# Patient Record
Sex: Female | Born: 1980 | Race: Black or African American | Hispanic: No | Marital: Married | Smoking: Current every day smoker
Health system: Southern US, Community
[De-identification: ages and names within clinical notes are randomized; demographics above are authoritative.]

## PROBLEM LIST (undated history)

## (undated) DIAGNOSIS — D649 Anemia, unspecified: Secondary | ICD-10-CM

## (undated) DIAGNOSIS — R011 Cardiac murmur, unspecified: Secondary | ICD-10-CM

## (undated) DIAGNOSIS — I10 Essential (primary) hypertension: Secondary | ICD-10-CM

## (undated) DIAGNOSIS — M25561 Pain in right knee: Secondary | ICD-10-CM

## (undated) DIAGNOSIS — G8929 Other chronic pain: Secondary | ICD-10-CM

## (undated) DIAGNOSIS — M25551 Pain in right hip: Secondary | ICD-10-CM

## (undated) DIAGNOSIS — F329 Major depressive disorder, single episode, unspecified: Secondary | ICD-10-CM

## (undated) DIAGNOSIS — F32A Depression, unspecified: Secondary | ICD-10-CM

## (undated) DIAGNOSIS — M51369 Other intervertebral disc degeneration, lumbar region without mention of lumbar back pain or lower extremity pain: Secondary | ICD-10-CM

## (undated) DIAGNOSIS — K219 Gastro-esophageal reflux disease without esophagitis: Secondary | ICD-10-CM

## (undated) DIAGNOSIS — F419 Anxiety disorder, unspecified: Secondary | ICD-10-CM

## (undated) DIAGNOSIS — J45909 Unspecified asthma, uncomplicated: Secondary | ICD-10-CM

## (undated) DIAGNOSIS — Z809 Family history of malignant neoplasm, unspecified: Secondary | ICD-10-CM

## (undated) DIAGNOSIS — E785 Hyperlipidemia, unspecified: Secondary | ICD-10-CM

## (undated) DIAGNOSIS — C801 Malignant (primary) neoplasm, unspecified: Secondary | ICD-10-CM

## (undated) DIAGNOSIS — M5126 Other intervertebral disc displacement, lumbar region: Secondary | ICD-10-CM

## (undated) DIAGNOSIS — M25562 Pain in left knee: Secondary | ICD-10-CM

## (undated) DIAGNOSIS — R269 Unspecified abnormalities of gait and mobility: Principal | ICD-10-CM

## (undated) DIAGNOSIS — E669 Obesity, unspecified: Secondary | ICD-10-CM

## (undated) DIAGNOSIS — M1990 Unspecified osteoarthritis, unspecified site: Secondary | ICD-10-CM

## (undated) DIAGNOSIS — M4800 Spinal stenosis, site unspecified: Secondary | ICD-10-CM

## (undated) DIAGNOSIS — M25552 Pain in left hip: Secondary | ICD-10-CM

## (undated) DIAGNOSIS — M5136 Other intervertebral disc degeneration, lumbar region: Secondary | ICD-10-CM

## (undated) HISTORY — DX: Pain in right hip: M25.551

## (undated) HISTORY — DX: Other intervertebral disc displacement, lumbar region: M51.26

## (undated) HISTORY — DX: Other intervertebral disc degeneration, lumbar region without mention of lumbar back pain or lower extremity pain: M51.369

## (undated) HISTORY — DX: Pain in left hip: M25.552

## (undated) HISTORY — DX: Pain in left knee: M25.562

## (undated) HISTORY — DX: Pain in right knee: M25.561

## (undated) HISTORY — DX: Family history of malignant neoplasm, unspecified: Z80.9

## (undated) HISTORY — DX: Depression, unspecified: F32.A

## (undated) HISTORY — DX: Other intervertebral disc degeneration, lumbar region: M51.36

## (undated) HISTORY — DX: Major depressive disorder, single episode, unspecified: F32.9

## (undated) HISTORY — PX: CHALAZION EXCISION: SHX213

## (undated) HISTORY — DX: Essential (primary) hypertension: I10

## (undated) HISTORY — DX: Unspecified abnormalities of gait and mobility: R26.9

## (undated) HISTORY — PX: WISDOM TOOTH EXTRACTION: SHX21

## (undated) HISTORY — DX: Other chronic pain: G89.29

## (undated) HISTORY — DX: Anxiety disorder, unspecified: F41.9

## (undated) HISTORY — DX: Spinal stenosis, site unspecified: M48.00

## (undated) HISTORY — DX: Obesity, unspecified: E66.9

## (undated) MED FILL — Goserelin Acetate Implant 3.6 MG: SUBCUTANEOUS | Qty: 3.6 | Status: AC

## (undated) NOTE — *Deleted (*Deleted)
Pt here for patient teaching.  Pt given Radiation and You booklet, skin care instructions, Alra deodorant and Radiaplex gel.  Reviewed areas of pertinence such as fatigue, hair loss, skin changes, breast tenderness and breast swelling . Pt able to give teach back of to pat skin and use unscented/gentle soap,apply Radiaplex bid, avoid applying anything to skin within 4 hours of treatment, avoid wearing an under wire bra and to use an electric razor if they must shave. Pt verbalizes understanding of information given and will contact nursing with any questions or concerns.     Adler Chartrand M. Llewelyn Sheaffer RN, BSN      

---
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
## 1999-01-30 ENCOUNTER — Emergency Department (HOSPITAL_COMMUNITY): Admission: EM | Admit: 1999-01-30 | Discharge: 1999-01-30 | Payer: Self-pay | Admitting: Emergency Medicine

## 1999-02-02 ENCOUNTER — Emergency Department (HOSPITAL_COMMUNITY): Admission: EM | Admit: 1999-02-02 | Discharge: 1999-02-02 | Payer: Self-pay | Admitting: *Deleted

## 2000-06-30 ENCOUNTER — Emergency Department (HOSPITAL_COMMUNITY): Admission: EM | Admit: 2000-06-30 | Discharge: 2000-06-30 | Payer: Self-pay | Admitting: Emergency Medicine

## 2000-10-14 ENCOUNTER — Ambulatory Visit (HOSPITAL_COMMUNITY): Admission: RE | Admit: 2000-10-14 | Discharge: 2000-10-14 | Payer: Self-pay | Admitting: *Deleted

## 2001-03-08 ENCOUNTER — Inpatient Hospital Stay (HOSPITAL_COMMUNITY): Admission: AD | Admit: 2001-03-08 | Discharge: 2001-03-11 | Payer: Self-pay | Admitting: *Deleted

## 2001-03-08 ENCOUNTER — Encounter (INDEPENDENT_AMBULATORY_CARE_PROVIDER_SITE_OTHER): Payer: Self-pay | Admitting: *Deleted

## 2002-01-27 ENCOUNTER — Emergency Department (HOSPITAL_COMMUNITY): Admission: EM | Admit: 2002-01-27 | Discharge: 2002-01-27 | Payer: Self-pay | Admitting: Emergency Medicine

## 2003-01-30 ENCOUNTER — Emergency Department (HOSPITAL_COMMUNITY): Admission: EM | Admit: 2003-01-30 | Discharge: 2003-01-30 | Payer: Self-pay | Admitting: Emergency Medicine

## 2003-02-26 ENCOUNTER — Emergency Department (HOSPITAL_COMMUNITY): Admission: EM | Admit: 2003-02-26 | Discharge: 2003-02-26 | Payer: Self-pay | Admitting: Emergency Medicine

## 2005-03-02 DIAGNOSIS — A4902 Methicillin resistant Staphylococcus aureus infection, unspecified site: Secondary | ICD-10-CM

## 2005-03-02 HISTORY — DX: Methicillin resistant Staphylococcus aureus infection, unspecified site: A49.02

## 2005-05-17 ENCOUNTER — Emergency Department (HOSPITAL_COMMUNITY): Admission: AD | Admit: 2005-05-17 | Discharge: 2005-05-17 | Payer: Self-pay | Admitting: Family Medicine

## 2006-04-11 ENCOUNTER — Emergency Department (HOSPITAL_COMMUNITY): Admission: EM | Admit: 2006-04-11 | Discharge: 2006-04-12 | Payer: Self-pay | Admitting: Emergency Medicine

## 2006-08-26 ENCOUNTER — Emergency Department (HOSPITAL_COMMUNITY): Admission: EM | Admit: 2006-08-26 | Discharge: 2006-08-26 | Payer: Self-pay | Admitting: Family Medicine

## 2006-12-08 ENCOUNTER — Emergency Department (HOSPITAL_COMMUNITY): Admission: EM | Admit: 2006-12-08 | Discharge: 2006-12-08 | Payer: Self-pay | Admitting: Family Medicine

## 2008-09-13 ENCOUNTER — Emergency Department (HOSPITAL_COMMUNITY): Admission: EM | Admit: 2008-09-13 | Discharge: 2008-09-13 | Payer: Self-pay | Admitting: Emergency Medicine

## 2008-11-03 ENCOUNTER — Emergency Department (HOSPITAL_COMMUNITY): Admission: EM | Admit: 2008-11-03 | Discharge: 2008-11-03 | Payer: Self-pay | Admitting: Emergency Medicine

## 2009-11-23 ENCOUNTER — Emergency Department (HOSPITAL_COMMUNITY): Admission: EM | Admit: 2009-11-23 | Discharge: 2009-11-23 | Payer: Self-pay | Admitting: Family Medicine

## 2009-11-26 ENCOUNTER — Emergency Department (HOSPITAL_COMMUNITY): Admission: EM | Admit: 2009-11-26 | Discharge: 2009-11-26 | Payer: Self-pay | Admitting: Family Medicine

## 2010-06-13 LAB — CULTURE, ROUTINE-ABSCESS

## 2010-10-31 ENCOUNTER — Inpatient Hospital Stay (INDEPENDENT_AMBULATORY_CARE_PROVIDER_SITE_OTHER)
Admission: RE | Admit: 2010-10-31 | Discharge: 2010-10-31 | Disposition: A | Discharge status: Home / Self Care | Payer: Self-pay | Source: Ambulatory Visit | Attending: Emergency Medicine | Admitting: Emergency Medicine

## 2010-10-31 ENCOUNTER — Ambulatory Visit (INDEPENDENT_AMBULATORY_CARE_PROVIDER_SITE_OTHER): Payer: Self-pay

## 2010-10-31 DIAGNOSIS — F411 Generalized anxiety disorder: Secondary | ICD-10-CM

## 2010-10-31 DIAGNOSIS — R071 Chest pain on breathing: Secondary | ICD-10-CM

## 2011-01-01 ENCOUNTER — Ambulatory Visit (HOSPITAL_COMMUNITY): Payer: Self-pay | Admitting: Physician Assistant

## 2011-01-09 LAB — CULTURE, ROUTINE-ABSCESS

## 2011-04-15 ENCOUNTER — Encounter (HOSPITAL_COMMUNITY): Payer: Self-pay | Admitting: Cardiology

## 2011-04-15 ENCOUNTER — Emergency Department (INDEPENDENT_AMBULATORY_CARE_PROVIDER_SITE_OTHER)
Admission: EM | Admit: 2011-04-15 | Discharge: 2011-04-15 | Disposition: A | Discharge status: Home / Self Care | Payer: Self-pay | Source: Home / Self Care

## 2011-04-15 DIAGNOSIS — J039 Acute tonsillitis, unspecified: Secondary | ICD-10-CM

## 2011-04-15 LAB — POCT RAPID STREP A: Streptococcus, Group A Screen (Direct): NEGATIVE

## 2011-04-15 MED ORDER — AMOXICILLIN 500 MG PO CAPS: 500.0000 mg | ORAL_CAPSULE | Freq: Three times a day (TID) | ORAL | Status: AC

## 2011-04-15 NOTE — ED Notes (Signed)
Pt reports sore throat and cough that started this past Friday. Denies fever. Decreased PO intake.

## 2011-04-15 NOTE — ED Provider Notes (Signed)
History     CSN: [id number]  Arrival date & time 04/15/11  1043   None     Chief Complaint  Patient presents with  . Cough  . Sore Throat    (Consider location/radiation/quality/duration/timing/severity/associated sxs/prior treatment) HPI Comments: Patient presents with today with complaints of sore throat for 5 days, worsens with swallowing. She also has a mild cough. She has been taking over-the-counter cough medications and using throat lozenges without improvement. No fever or nasal congestion. The glands in her throat also felt swollen but have improved.    History reviewed. No pertinent past medical history.  Past Surgical History  Procedure Date  . Vaginal delivery 2002    No family history on file.  History  Substance Use Topics  . Smoking status: Current Everyday Smoker -- 0.5 packs/day  . Smokeless tobacco: Not on file  . Alcohol Use: Yes     occas    OB History    Grav Para Term Preterm Abortions TAB SAB Ect Mult Living                  Review of Systems  Constitutional: Negative for fever, chills and fatigue.  HENT: Positive for sore throat. Negative for ear pain, rhinorrhea, sneezing, postnasal drip and sinus pressure.   Respiratory: Positive for cough. Negative for shortness of breath and wheezing.   Cardiovascular: Negative for chest pain and palpitations.    Allergies  Aspirin  Home Medications   Current Outpatient Rx  Name Route Sig Dispense Refill  . AMOXICILLIN 500 MG PO CAPS Oral Take 1 capsule (500 mg total) by mouth 3 (three) times daily. 30 capsule 0    BP 148/81  Pulse 82  Temp(Src) 98.5 F (36.9 C) (Oral)  Resp 20  SpO2 99%  LMP 04/04/2011  Physical Exam  Nursing note and vitals reviewed. Constitutional: She appears well-developed and well-nourished. No distress.  HENT:  Head: Normocephalic and atraumatic.  Right Ear: Tympanic membrane, external ear and ear canal normal.  Left Ear: Tympanic membrane, external ear and  ear canal normal.  Nose: Nose normal.  Mouth/Throat: Uvula is midline and mucous membranes are normal. Oropharyngeal exudate and posterior oropharyngeal erythema present. No posterior oropharyngeal edema or tonsillar abscesses.       Bilat tonsils 1+ with white pustules.   Neck: Neck supple.  Cardiovascular: Normal rate, regular rhythm and normal heart sounds.   Pulmonary/Chest: Effort normal and breath sounds normal. No respiratory distress.  Lymphadenopathy:    She has no cervical adenopathy.  Neurological: She is alert.  Skin: Skin is warm and dry.  Psychiatric: She has a normal mood and affect.    ED Course  Procedures (including critical care time)   Labs Reviewed  POCT RAPID STREP A ([facility] ONLY)   No results found.   1. Acute tonsillitis       MDM  Rapid strep neg.         Melody Comas, [place] 04/15/11 1140

## 2011-04-17 NOTE — ED Provider Notes (Signed)
Medical screening examination/treatment/procedure(s) were performed by non-physician practitioner and as supervising physician I was immediately available for consultation/collaboration.  Rudra Hobbins M. MD   Nasiya Pascual M Lucendia Leard, MD 04/17/11 0852 

## 2012-07-06 ENCOUNTER — Emergency Department (INDEPENDENT_AMBULATORY_CARE_PROVIDER_SITE_OTHER): Payer: Medicaid Other

## 2012-07-06 ENCOUNTER — Emergency Department (HOSPITAL_COMMUNITY)
Admission: EM | Admit: 2012-07-06 | Discharge: 2012-07-06 | Disposition: A | Discharge status: Home / Self Care | Payer: Medicaid Other | Source: Home / Self Care | Attending: Family Medicine | Admitting: Family Medicine

## 2012-07-06 ENCOUNTER — Encounter (HOSPITAL_COMMUNITY): Payer: Self-pay | Admitting: *Deleted

## 2012-07-06 DIAGNOSIS — M25569 Pain in unspecified knee: Secondary | ICD-10-CM

## 2012-07-06 DIAGNOSIS — M25562 Pain in left knee: Secondary | ICD-10-CM

## 2012-07-06 MED ORDER — DICLOFENAC POTASSIUM 50 MG PO TABS: 50.0000 mg | ORAL_TABLET | Freq: Three times a day (TID) | ORAL | Status: DC

## 2012-07-06 NOTE — ED Notes (Signed)
C/o bil. Knee pain x 2 weeks. No known injury.  Works as a Runner, broadcasting/film/video in [employer].  Denies squatting but does sit in small chairs.  No swelling noted by pt.

## 2012-07-06 NOTE — ED Provider Notes (Signed)
History     CSN: [id number]  Arrival date & time 07/06/12  1532   First MD Initiated Contact with Patient 07/06/12 [phone]      Chief Complaint  Patient presents with  . Knee Pain    (Consider location/radiation/quality/duration/timing/severity/associated sxs/prior treatment) Patient is a 32 y.o. female presenting with knee pain. The history is provided by the patient.  Knee Pain Location:  Knee Time since incident:  2 weeks Injury: no   Knee location:  L knee and R knee Pain details:    Quality:  Aching and sharp   Radiates to:  Does not radiate   Severity:  Mild   Onset quality:  Gradual   Progression:  Unchanged Chronicity:  New Dislocation: no   Relieved by:  Nothing Ineffective treatments:  NSAIDs, arthritis medication and heat Associated symptoms: no back pain, no muscle weakness, no numbness and no swelling     History reviewed. No pertinent past medical history.  Past Surgical History  Procedure Laterality Date  . Vaginal delivery  2002    Family History  Problem Relation Age of Onset  . Osteoarthritis Mother   . Hypertension Father   . Gout Father     History  Substance Use Topics  . Smoking status: Current Every Day Smoker -- 0.50 packs/day    Types: Cigarettes  . Smokeless tobacco: Not on file  . Alcohol Use: Yes     Comment: occas    OB History   Grav Para Term Preterm Abortions TAB SAB Ect Mult Living                  Review of Systems  Constitutional: Negative.   Gastrointestinal: Negative.   Musculoskeletal: Negative for myalgias, back pain and joint swelling.  Skin: Negative.     Allergies  Aspirin  Home Medications   Current Outpatient Rx  Name  Route  Sig  Dispense  Refill  . acetaminophen (TYLENOL) 500 MG tablet   Oral   Take 500 mg by mouth every 6 (six) hours as needed for pain.         Marland Kitchen ibuprofen (ADVIL,MOTRIN) 800 MG tablet   Oral   Take 800 mg by mouth every 8 (eight) hours as needed for pain.         Marland Kitchen  diclofenac (CATAFLAM) 50 MG tablet   Oral   Take 1 tablet (50 mg total) by mouth 3 (three) times daily.   30 tablet   0     BP 146/79  Pulse 62  Temp(Src) 98.6 F (37 C) (Oral)  Resp 16  SpO2 100%  LMP 07/02/2012  Physical Exam  Nursing note and vitals reviewed. Constitutional: She is oriented to person, place, and time.  Abdominal: Soft. Bowel sounds are normal.  Musculoskeletal: Normal range of motion. She exhibits tenderness. She exhibits no edema.       Right knee: She exhibits normal range of motion, no swelling, no effusion, no deformity, no LCL laxity, normal patellar mobility, normal meniscus and no MCL laxity. Tenderness found. No medial joint line and no lateral joint line tenderness noted.       Left knee: She exhibits bony tenderness. She exhibits normal range of motion, no swelling, no effusion and normal meniscus. Tenderness found. No patellar tendon tenderness noted.       Legs: Neurological: She is alert and oriented to person, place, and time.  Skin: Skin is warm and dry.    ED Course  Procedures (including critical  care time)  Labs Reviewed - No data to display Dg Knee Complete 4 Views Left  07/06/2012  *RADIOLOGY REPORT*  Clinical Data: Pain  LEFT KNEE - COMPLETE 4+ VIEW  Comparison: None.  Findings: There is no evidence of fracture or dislocation.  There is no evidence of arthropathy or other focal bone abnormality. Soft tissues are unremarkable.  IMPRESSION: Negative exam.   Original Report Authenticated By: Signa Kell, M.D.      1. Knee pain, bilateral       MDM  X-rays reviewed and report per radiologist.         Linna Hoff, MD 07/06/12 [phone]

## 2012-07-25 ENCOUNTER — Emergency Department (HOSPITAL_COMMUNITY)
Admission: EM | Admit: 2012-07-25 | Discharge: 2012-07-25 | Disposition: A | Discharge status: Home / Self Care | Payer: Medicaid Other | Attending: Emergency Medicine | Admitting: Emergency Medicine

## 2012-07-25 ENCOUNTER — Encounter (HOSPITAL_COMMUNITY): Payer: Self-pay | Admitting: *Deleted

## 2012-07-25 ENCOUNTER — Emergency Department (HOSPITAL_COMMUNITY): Payer: Medicaid Other

## 2012-07-25 DIAGNOSIS — R197 Diarrhea, unspecified: Secondary | ICD-10-CM | POA: Insufficient documentation

## 2012-07-25 DIAGNOSIS — Y9301 Activity, walking, marching and hiking: Secondary | ICD-10-CM | POA: Insufficient documentation

## 2012-07-25 DIAGNOSIS — S8990XA Unspecified injury of unspecified lower leg, initial encounter: Secondary | ICD-10-CM | POA: Insufficient documentation

## 2012-07-25 DIAGNOSIS — R296 Repeated falls: Secondary | ICD-10-CM | POA: Insufficient documentation

## 2012-07-25 DIAGNOSIS — F17200 Nicotine dependence, unspecified, uncomplicated: Secondary | ICD-10-CM | POA: Insufficient documentation

## 2012-07-25 DIAGNOSIS — R269 Unspecified abnormalities of gait and mobility: Secondary | ICD-10-CM

## 2012-07-25 DIAGNOSIS — S99929A Unspecified injury of unspecified foot, initial encounter: Secondary | ICD-10-CM | POA: Insufficient documentation

## 2012-07-25 DIAGNOSIS — Y929 Unspecified place or not applicable: Secondary | ICD-10-CM | POA: Insufficient documentation

## 2012-07-25 LAB — CBC WITH DIFFERENTIAL/PLATELET
Basophils Absolute: 0 10*3/uL (ref 0.0–0.1)
Basophils Relative: 1 % (ref 0–1)
HCT: 39.8 % (ref 36.0–46.0)
MCHC: 34.4 g/dL (ref 30.0–36.0)
Monocytes Absolute: 0.6 10*3/uL (ref 0.1–1.0)
Neutro Abs: 5.2 10*3/uL (ref 1.7–7.7)
Neutrophils Relative %: 61 % (ref 43–77)
Platelets: 345 10*3/uL (ref 150–400)
RDW: 13.2 % (ref 11.5–15.5)
WBC: 8.4 10*3/uL (ref 4.0–10.5)

## 2012-07-25 LAB — BASIC METABOLIC PANEL
Chloride: 106 mEq/L (ref 96–112)
Creatinine, Ser: 0.88 mg/dL (ref 0.50–1.10)
GFR calc Af Amer: >90 mL/min (ref >90)
Sodium: 136 mEq/L (ref 135–145)

## 2012-07-25 LAB — URINALYSIS, ROUTINE W REFLEX MICROSCOPIC
Ketones, ur: NEGATIVE mg/dL
Nitrite: NEGATIVE
Protein, ur: NEGATIVE mg/dL
pH: 5.5 (ref 5.0–8.0)

## 2012-07-25 LAB — URINE MICROSCOPIC-ADD ON

## 2012-07-25 MED ORDER — KETOROLAC TROMETHAMINE 60 MG/2ML IM SOLN
60.0000 mg | Freq: Once | INTRAMUSCULAR | Status: AC
  Administered 2012-07-25: 60 mg via INTRAMUSCULAR
  Filled 2012-07-25: qty 2

## 2012-07-25 MED ORDER — HYDROCODONE-ACETAMINOPHEN 5-325 MG PO TABS: 2.0000 | ORAL_TABLET | ORAL | Status: DC | PRN

## 2012-07-25 MED ORDER — GADOBENATE DIMEGLUMINE 529 MG/ML IV SOLN
20.0000 mL | Freq: Once | INTRAVENOUS | Status: AC | PRN
  Administered 2012-07-25: 20 mL via INTRAVENOUS

## 2012-07-25 MED ORDER — OXYCODONE-ACETAMINOPHEN 5-325 MG PO TABS
2.0000 | ORAL_TABLET | Freq: Once | ORAL | Status: AC
  Administered 2012-07-25: 2 via ORAL
  Filled 2012-07-25: qty 2

## 2012-07-25 NOTE — ED Notes (Signed)
MD at bedside. 

## 2012-07-25 NOTE — ED Provider Notes (Signed)
History     CSN: [id number]  Arrival date & time 07/25/12  0818   First MD Initiated Contact with Patient 07/25/12 [phone]      Chief Complaint  Patient presents with  . Abdominal Pain  . Leg Pain  . Diarrhea    (Consider location/radiation/quality/duration/timing/severity/associated sxs/prior treatment) HPI Comments: Pt has a hx of no sig problems, is 31 and has had 4 weeks of bilateral knee pain.  She states that she was initially seen at [facility] where she had bilateral knee x-rays ordered, no signs of fracture or dislocation was seen in the patient was discharged with anti-inflammatory medications. Since that time she has progressive pain in her knees which seems to be worse at night, better early in the day, had 2 unexplained falls while she was walking, now states that she is walking with a limp and has numbness in her left lower extremity in the thigh and the lower extremity below the knee. She denies any symptoms in her upper extremities and has no problems with her vision, speech or her urinary or bowel incontinence.  She denies any family history of lupus, rheumatoid arthritis or other connective tissue or autoimmune diseases. She denies any family history of multiple sclerosis. She has not had any recent illnesses such as viral upper respiratory or GI illnesses.  Symptoms are persistent and gradually worsening. She has been using anti-inflammatories with minimal improvement  Patient is a 32 y.o. female presenting with abdominal pain, leg pain, and diarrhea. The history is provided by the patient.  Abdominal Pain Associated symptoms: diarrhea   Leg Pain Diarrhea Associated symptoms: abdominal pain     History reviewed. No pertinent past medical history.  Past Surgical History  Procedure Laterality Date  . Vaginal delivery  2002    Family History  Problem Relation Age of Onset  . Osteoarthritis Mother   . Hypertension Father   . Gout Father     History  Substance Use Topics   . Smoking status: Current Every Day Smoker -- 0.50 packs/day    Types: Cigarettes  . Smokeless tobacco: Never Used  . Alcohol Use: Yes     Comment: occas    OB History   Grav Para Term Preterm Abortions TAB SAB Ect Mult Living                  Review of Systems  Gastrointestinal: Positive for abdominal pain and diarrhea.  All other systems reviewed and are negative.    Allergies  Aspirin  Home Medications   Current Outpatient Rx  Name  Route  Sig  Dispense  Refill  . acetaminophen (TYLENOL) 500 MG tablet   Oral   Take 500 mg by mouth every 6 (six) hours as needed for pain.         Marland Kitchen diclofenac (CATAFLAM) 50 MG tablet   Oral   Take 1 tablet (50 mg total) by mouth 3 (three) times daily.   30 tablet   0   . ibuprofen (ADVIL,MOTRIN) 800 MG tablet   Oral   Take 800 mg by mouth every 8 (eight) hours as needed for pain.         Marland Kitchen HYDROcodone-acetaminophen (NORCO/VICODIN) 5-325 MG per tablet   Oral   Take 2 tablets by mouth every 4 (four) hours as needed for pain.   10 tablet   0     BP 126/52  Pulse 52  Temp(Src) 98.2 F (36.8 C) (Oral)  Resp 16  Wt 252  lb (114.306 kg)  SpO2 100%  LMP 07/02/2012  Physical Exam  Nursing note and vitals reviewed. Constitutional: She appears well-developed and well-nourished. No distress.  HENT:  Head: Normocephalic and atraumatic.  Mouth/Throat: Oropharynx is clear and moist. No oropharyngeal exudate.  Eyes: Conjunctivae and EOM are normal. Pupils are equal, round, and reactive to light. Right eye exhibits no discharge. Left eye exhibits no discharge. No scleral icterus.  Neck: Normal range of motion. Neck supple. No JVD present. No thyromegaly present.  Cardiovascular: Normal rate, regular rhythm, normal heart sounds and intact distal pulses.  Exam reveals no gallop and no friction rub.   No murmur heard. Pulmonary/Chest: Effort normal and breath sounds normal. No respiratory distress. She has no wheezes. She has no  rales.  Abdominal: Soft. Bowel sounds are normal. She exhibits no distension and no mass. There is no tenderness.  Musculoskeletal: Normal range of motion. She exhibits no edema and no tenderness.  Lymphadenopathy:    She has no cervical adenopathy.  Neurological: She is alert. Coordination normal.  Normal reflexes at her knees bilaterally, normal strength of the quadriceps, hamstrings and calf muscles, normal strength of the upper extremities and major muscle groups. No tenderness with range of motion of the knees bilaterally, no tenderness with palpation of the thighs or the calf muscles bilaterally, sensation decreased to light touch and pinprick of the left lower extremity below the hip.  Per extremities with normal sensation, cranial nerves III through XII intact.  Gait is abnormal, she is unable to heel toe walk, feels unstable and has apparent mild left leg weakness when she walks.  Skin: Skin is warm and dry. No rash noted. No erythema.  Psychiatric: She has a normal mood and affect. Her behavior is normal.    ED Course  Procedures (including critical care time)  Labs Reviewed  BASIC METABOLIC PANEL - Abnormal; Notable for the following:    Potassium 5.3 (*)    Glucose, Bld 114 (*)    GFR calc non Af Amer 86 (*)    All other components within normal limits  URINALYSIS, ROUTINE W REFLEX MICROSCOPIC - Abnormal; Notable for the following:    APPearance CLOUDY (*)    Leukocytes, UA SMALL (*)    All other components within normal limits  CK - Abnormal; Notable for the following:    Total CK 668 (*)    All other components within normal limits  URINE MICROSCOPIC-ADD ON - Abnormal; Notable for the following:    Squamous Epithelial / LPF FEW (*)    All other components within normal limits  CBC WITH DIFFERENTIAL  SEDIMENTATION RATE   Mr Laqueta Jean Wo Contrast  07/25/2012  *RADIOLOGY REPORT*  Clinical Data: Bilateral weakness and numbness for 4 weeks progressively getting worse.   Evaluate for possible multiple sclerosis lesion.  MRI HEAD WITHOUT AND WITH CONTRAST  Technique:  Multiplanar, multiecho pulse sequences of the brain and surrounding structures were obtained according to standard protocol without and with intravenous contrast  Contrast: 20mL MULTIHANCE GADOBENATE DIMEGLUMINE 529 MG/ML IV SOLN  Comparison: None.  Findings: No acute infarct.  No intracranial hemorrhage.  No focal area of altered signal intensity to suggest the presence of a demyelinating process.  No intracranial mass or abnormal enhancement.  Expanded partially empty sella.  Although this may be an incidental finding, this has been described in patients with pseudotumor cerebri.  Remodeling of the sella greater on the left.  Exophthalmos.  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  Polypoid opacification in the left maxillary sinus.  IMPRESSION: No acute infarct.  No focal area of altered signal intensity to suggest the presence of a demyelinating process.  No intracranial mass or abnormal enhancement.  Expanded partially empty sella.  Although this may be an incidental finding, this has been described in patients with pseudotumor cerebri.  Remodeling of the sella greater on the left.  Exophthalmos.   Original Report Authenticated By: Lacy Duverney, M.D.      1. Lower extremity pain, unspecified laterality   2. Gait abnormality       MDM  There is concern that the patient has a neurologic process giving her left lower extremity symptoms such as weakness and imbalance, would be concerned for things such as multiple sclerosis or other etiologies. She does have mild back pain but this is intermittent and does not seem to correlate with her other symptoms. MRI pending, labs pending, patient hemodynamically stable.  Safely the sedimentation rate is normal, white blood cell count is normal, urinalysis is clean, CK is slightly elevated  at 660 and the potassium is 5.3. The patient has been informed of all these results and she will need to followup closely to have them rechecked. She has been given return precautions should her symptoms worsen. I have since watched her ambulate down the hallway by herself to the bathroom and she had a much more stable gait. I doubt that this is related to her lumbar spine given her minimal pain and ability to have normal strength of her legs, it does not appear to be a demyelinating disease given her MRI of the brain. I recommend a neurologic followup  Meds given in ED:  Medications  ketorolac (TORADOL) injection 60 mg (60 mg Intramuscular Given 07/25/12 1038)  gadobenate dimeglumine (MULTIHANCE) injection 20 mL (20 mLs Intravenous Contrast Given 07/25/12 1147)  oxyCODONE-acetaminophen (PERCOCET/ROXICET) 5-325 MG per tablet 2 tablet (2 tablets Oral Given 07/25/12 1307)    New Prescriptions   HYDROCODONE-ACETAMINOPHEN (NORCO/VICODIN) 5-325 MG PER TABLET    Take 2 tablets by mouth every 4 (four) hours as needed for pain.          Vida Roller, MD 07/25/12 [phone]

## 2012-07-25 NOTE — ED Notes (Signed)
Pt from home with reports of bilateral knee pain that started around the end of March which pt was treated for 4/9 at [facility]gent [facility] with minimal improvement. Pt reports that knees/legs have "just given away" causing a fall 2 weeks ago. Pt also endorses intermittent abdominal pain and diarrhea.

## 2012-07-25 NOTE — ED Notes (Signed)
Pt was found lying on the floor but the secretary and tech in room 13. I was called into the room and tech Tawanna Cooler and [facility] was at bedside. Pt states that she was getting up to get apples in her chair and returned to bed and states that she slipped and fell while getting back to bed. States that she did not feel dizzy and had not had pain medication. Pt did have the call bell in the bed and red socks on. Pt was a low fall risk per nurse Lyla Son. Pt did say that the secretary told her to use the call bell but she had been walking around on her own with out any problems. Pt states her knees feel sore but was able to stand on her own and get to the bed with minimal assistance. Room door is open, curtian pulled back, call bell in reach, red socks in place and fall assesment done for high risk falls. Reported to md .

## 2012-07-25 NOTE — ED Notes (Signed)
Pt returned from MRI °

## 2012-07-27 ENCOUNTER — Telehealth: Payer: Self-pay | Admitting: Neurology

## 2012-07-27 NOTE — Telephone Encounter (Signed)
Pt left VM requesting neuro appt this week following ER visit. Dr. Arbutus Leas has reviewed ER notes and denied appt at this time. Pt needs to establish care w/ a PCP and the PCP can refer her to neuro if needed. Pt has [facility] w/ [facility] on the card, they will need to issue a referral and provide authorization. Returned call to patient and left message on her voicemail advising her of this / Sherri S.

## 2012-08-02 ENCOUNTER — Ambulatory Visit (INDEPENDENT_AMBULATORY_CARE_PROVIDER_SITE_OTHER): Payer: Medicaid Other | Admitting: Neurology

## 2012-08-02 ENCOUNTER — Telehealth: Payer: Self-pay | Admitting: Neurology

## 2012-08-02 ENCOUNTER — Encounter: Payer: Self-pay | Admitting: Neurology

## 2012-08-02 VITALS — BP 153/99 | HR 96 | Ht 66.5 in | Wt 241.0 lb

## 2012-08-02 DIAGNOSIS — M25559 Pain in unspecified hip: Secondary | ICD-10-CM

## 2012-08-02 DIAGNOSIS — IMO0001 Reserved for inherently not codable concepts without codable children: Secondary | ICD-10-CM

## 2012-08-02 DIAGNOSIS — G8929 Other chronic pain: Secondary | ICD-10-CM

## 2012-08-02 DIAGNOSIS — R269 Unspecified abnormalities of gait and mobility: Secondary | ICD-10-CM | POA: Insufficient documentation

## 2012-08-02 HISTORY — DX: Other chronic pain: G89.29

## 2012-08-02 HISTORY — DX: Unspecified abnormalities of gait and mobility: R26.9

## 2012-08-02 MED ORDER — HYDROCODONE-ACETAMINOPHEN 5-325 MG PO TABS: 2.0000 | ORAL_TABLET | Freq: Four times a day (QID) | ORAL | Status: DC | PRN

## 2012-08-02 NOTE — Telephone Encounter (Signed)
I called the patient. She got hydrocodone through the ER and she needs another Rx. I will call in a Rx.

## 2012-08-02 NOTE — Progress Notes (Signed)
Reason for visit: Arthralgias  Heidi Costa is a 32 y.o. female  History of present illness:  Heidi Costa is a 32 year old right-handed black female with a history of arthralgias mainly involving the knees that began around the end of March of 2014. The patient has developed some sensory alteration in the legs, left greater than right, that may go up to the knees or even into the thighs. The patient has a tender sensation across the lower abdomen, left greater than right. The patient has also noted that her bowels and her bladder are not working as well. The patient recently has developed some fecal incontinence that has occurred on occasion. The patient also is having some problems with controlling the bladder. The patient has noted a change in balance, and she is using a cane for ambulation. MRI evaluation of the brain was relatively unremarkable. The patient is also having some cramps involving the right arm, and some sensation of arm weakness on the right. The patient has had blood work that shows a normal sedimentation rate, but the CK enzyme level was in the 660 range. The patient indicates that she will had a fall around the time of the blood work being done. The patient is being sent to this office for an evaluation.  Past Medical History  Diagnosis Date  . Bulging lumbar disc   . Depression   . Anxiety   . Abnormality of gait 08/02/2012  . Chronic arthralgias of knees and hips 08/02/2012  . Obesity     Past Surgical History  Procedure Laterality Date  . Vaginal delivery  2002    Family History  Problem Relation Age of Onset  . Osteoarthritis Mother   . Hypertension Mother   . Hypertension Father   . Gout Father   . Stroke Father     Social history:  reports that she has been smoking Cigarettes.  She has been smoking about 0.50 packs per day. She has never used smokeless tobacco. She reports that  drinks alcohol. She reports that she does not use illicit  drugs.  Medications:  Current Outpatient Prescriptions on File Prior to Visit  Medication Sig Dispense Refill  . acetaminophen (TYLENOL) 500 MG tablet Take 500 mg by mouth every 6 (six) hours as needed for pain.      Marland Kitchen diclofenac (CATAFLAM) 50 MG tablet Take 1 tablet (50 mg total) by mouth 3 (three) times daily.  30 tablet  0  . ibuprofen (ADVIL,MOTRIN) 800 MG tablet Take 800 mg by mouth every 8 (eight) hours as needed for pain.       No current facility-administered medications on file prior to visit.    Allergies:  Allergies  Allergen Reactions  . Aspirin Rash    ROS:  Out of a complete 14 system review of symptoms, the patient complains only of the following symptoms, and all other reviewed systems are negative.  Fatigue Urination problems Feeling hot, cold Joint pains, achy muscles Skin sensitivity Numbness, weakness Anxiety, depression, decreased energy, change in appetite, this interest in activities Sleepiness, snoring, restless legs  Blood pressure 153/99, pulse 96, height 5' 6.5" (1.689 m), weight 241 lb (109.317 kg), last menstrual period 07/02/2012.  Physical Exam  General: The patient is alert and cooperative at the time of the examination. The patient is markedly obese.  Head: Pupils are equal, round, and reactive to light. Discs are flat bilaterally.  Neck: The neck is supple, no carotid bruits are noted.  Respiratory: The respiratory  examination is clear.  Cardiovascular: The cardiovascular examination reveals a regular rate and rhythm, no obvious murmurs or rubs are noted.  Skin: Extremities are without significant edema.  Neurologic Exam  Mental status:  Cranial nerves: Facial symmetry is present. There is good sensation of the face to pinprick and soft touch bilaterally. The strength of the facial muscles and the muscles to head turning and shoulder shrug are normal bilaterally. Speech is well enunciated, no aphasia or dysarthria is noted.  Extraocular movements are full. Visual fields are full. There is a malar rash across the cheeks.  Motor: The motor testing reveals 5 over 5 strength of all 4 extremities. Good symmetric motor tone is noted throughout. The patient is able to arise from a seated position with the arms crossed.  Sensory: Sensory testing is intact to pinprick, soft touch, vibration sensation, and position sense on all 4 extremities , with the exception that there is some slight decrease in pinprick sensation in the left upper chest and the left arm. There is decreased pinprick sensation and vibration sensation of the left foot.  No evidence of extinction is noted.  Coordination: Cerebellar testing reveals good finger-nose-finger and heel-to-shin bilaterally.  Gait and station: Gait is wide-based, slightly unsteady. The patient uses a cane for ambulation. Tandem gait is unsteady. Romberg is negative, but is unsteady . No drift is seen.  Reflexes: Deep tendon reflexes are symmetric and normal bilaterally. Toes are downgoing bilaterally.   Assessment/Plan:  1. Arthralgias  2. Gait disturbance  The patient has had some changes in sensation and strength of the legs, gait instability, and an elevation in the CK enzyme level. The patient also has a malar rash across the cheeks that has been present for almost 2 years. The patient will need to be evaluated for a connective tissue disease process such as lupus. The patient will be set up for blood work today, and she will have MRI evaluation of the cervical and lumbosacral spine. If the CK enzyme elevation remains, EMG evaluation to exclude the possibility of a low-grade inflammatory myopathy needs to be considered. The patient may require a rheumatology referral in the future.  Marlan Palau MD 08/02/2012 8:43 PM  [facility] [address] [place], [place] [address]  Phone [phone] Fax [phone]

## 2012-08-11 ENCOUNTER — Other Ambulatory Visit: Payer: Self-pay | Admitting: Neurology

## 2012-08-11 DIAGNOSIS — R269 Unspecified abnormalities of gait and mobility: Secondary | ICD-10-CM

## 2012-08-13 ENCOUNTER — Inpatient Hospital Stay: Admission: RE | Admit: 2012-08-13 | Payer: Medicaid Other | Source: Ambulatory Visit

## 2012-08-13 ENCOUNTER — Ambulatory Visit
Admission: RE | Admit: 2012-08-13 | Discharge: 2012-08-13 | Disposition: A | Discharge status: Home / Self Care | Payer: Medicaid Other | Source: Ambulatory Visit | Attending: Neurology | Admitting: Neurology

## 2012-08-13 DIAGNOSIS — R269 Unspecified abnormalities of gait and mobility: Secondary | ICD-10-CM

## 2012-08-13 DIAGNOSIS — IMO0001 Reserved for inherently not codable concepts without codable children: Secondary | ICD-10-CM

## 2012-08-15 ENCOUNTER — Telehealth: Payer: Self-pay | Admitting: Neurology

## 2012-08-15 NOTE — Telephone Encounter (Signed)
I Called the patient. The MRI of the cervical spine shows a disc herniation with cord compression at the C5-6 level. The patient will need a neurosurgical evaluation. The MRI of the lumbosacral spine shows some mild spondylitic changes, no surgically amenable problems.

## 2012-08-15 NOTE — Telephone Encounter (Signed)
I called the patient again and I left messages. The patient is to call our office.

## 2012-08-16 ENCOUNTER — Telehealth: Payer: Self-pay | Admitting: Neurology

## 2012-08-19 ENCOUNTER — Telehealth: Payer: Self-pay | Admitting: Neurology

## 2012-08-19 DIAGNOSIS — M4712 Other spondylosis with myelopathy, cervical region: Secondary | ICD-10-CM

## 2012-08-19 NOTE — Telephone Encounter (Signed)
Spoke with patient and she said that her right side neck pain,humming in ears has gotten worse, went to see a ENT doctor and he suggested that she schedule an appt  with her neurologist. Patient is requesting to also be referred to see an neurosurgeon and possible have an MRI scheduled.May leave message on cell:[phone].

## 2012-08-19 NOTE — Telephone Encounter (Signed)
I spoke to patient and she is interested in getting the surgery.  I told her I would let doctor know so the referral can be made to neurosurgeon and the process can begin.  Please advise if I need to put in referral.

## 2012-08-19 NOTE — Telephone Encounter (Signed)
I will make a referral to a neurosurgeon.

## 2012-08-31 ENCOUNTER — Telehealth: Payer: Self-pay | Admitting: Neurology

## 2012-09-02 ENCOUNTER — Telehealth: Payer: Self-pay | Admitting: Neurology

## 2012-09-02 NOTE — Telephone Encounter (Signed)
Patient is calling to tell us she is still waiting for a referral from [place], so she can get her surgery scheduled.  She tells me she has spoken to Dr. Anne Hahn regarding this.  Please give her a call back at:  [phone].

## 2012-09-13 ENCOUNTER — Other Ambulatory Visit: Payer: Self-pay

## 2012-09-13 MED ORDER — HYDROCODONE-ACETAMINOPHEN 5-325 MG PO TABS: 2.0000 | ORAL_TABLET | Freq: Four times a day (QID) | ORAL | Status: DC | PRN

## 2012-09-13 NOTE — Telephone Encounter (Signed)
Patient called, left message.  Said she is out of Vicodin and needs it refilled today.  (Today is the 21st day)

## 2012-09-27 ENCOUNTER — Telehealth: Payer: Self-pay | Admitting: Neurology

## 2012-09-27 NOTE — Telephone Encounter (Signed)
Spoke to patient. Will check status of referral to neurosurgeon. Patient states she is still in pain.

## 2012-09-27 NOTE — Telephone Encounter (Signed)
Returning [employer]. Please call [phone]

## 2012-09-28 ENCOUNTER — Telehealth: Payer: Self-pay

## 2012-09-29 ENCOUNTER — Telehealth: Payer: Self-pay | Admitting: Neurology

## 2012-09-29 NOTE — Telephone Encounter (Signed)
Sent to [place] [facility] in referrals.

## 2012-09-30 ENCOUNTER — Other Ambulatory Visit: Payer: Self-pay | Admitting: Neurology

## 2012-10-03 NOTE — Telephone Encounter (Signed)
I called the patient. The patient once again indicates that she has not heard anything from the neurosurgical evaluation. The patient is now having low back pain as well. I have personally called [facility], and they indicate that they have not received a referral from our office. I have called Sandy P. in our office, and I have transmitted this information to her, I indicated that a referral needs to be made ASAP. This patient has spinal cord compression at the C5-6 level, initial referral was made around 08/19/2012.

## 2012-10-03 NOTE — Telephone Encounter (Signed)
After further analysis, the patient apparently has 2 names, she now goes by [employer]. The name on her Medicaid card is [place]. The patient just recently got her card in proper order at the end of June 2014 so that a referral can be made appropriately.

## 2012-10-08 ENCOUNTER — Ambulatory Visit
Admission: RE | Admit: 2012-10-08 | Discharge: 2012-10-08 | Disposition: A | Discharge status: Home / Self Care | Payer: Medicaid Other | Source: Ambulatory Visit | Attending: Neurology | Admitting: Neurology

## 2012-10-08 DIAGNOSIS — IMO0001 Reserved for inherently not codable concepts without codable children: Secondary | ICD-10-CM

## 2012-10-08 DIAGNOSIS — R269 Unspecified abnormalities of gait and mobility: Secondary | ICD-10-CM

## 2012-10-08 MED ORDER — GADOBENATE DIMEGLUMINE 529 MG/ML IV SOLN
20.0000 mL | Freq: Once | INTRAVENOUS | Status: AC | PRN
  Administered 2012-10-08: 20 mL via INTRAVENOUS

## 2012-10-10 ENCOUNTER — Telehealth: Payer: Self-pay | Admitting: Neurology

## 2012-10-10 NOTE — Telephone Encounter (Signed)
The spinal cord progression noted at the C5-6 level is actually at the C6-7 level. The patient has been referred to neurosurgery.

## 2012-10-11 MED ORDER — OXYCODONE-ACETAMINOPHEN 5-325 MG PO TABS: 1.0000 | ORAL_TABLET | ORAL | Status: DC | PRN

## 2012-10-11 NOTE — Telephone Encounter (Signed)
Called the patient to get more information about her pain,left voice message asking the patient to return my call.

## 2012-10-11 NOTE — Telephone Encounter (Signed)
I called patient. She is not getting good pain relief with the hydrocodone. The patient will be seen by neurosurgery next week, I will write a prescription for the oxycodone. I called the patient and I left a message.

## 2012-10-11 NOTE — Telephone Encounter (Signed)
Patient states she has been having a lot of pain, so she was taking 4 hydrocodone every 6 hours and has run out of this medication. She has a appointment with neurosurgeon next week but wants something else for pain because her current dose 5-325 of hydrocodone does not work. Please advise

## 2012-10-11 NOTE — Addendum Note (Signed)
Addended by: Stephanie Acre on: 10/11/2012 12:45 PM   Modules accepted: Orders

## 2012-10-16 ENCOUNTER — Other Ambulatory Visit: Payer: Self-pay | Admitting: Neurology

## 2012-10-17 ENCOUNTER — Other Ambulatory Visit: Payer: Self-pay | Admitting: Neurosurgery

## 2012-10-17 NOTE — Telephone Encounter (Signed)
Patient stated Hydrocodone was not working therefore a Rx for Oxycodone was given in it's place.  See Dr Anne Hahn' note.

## 2012-10-24 ENCOUNTER — Encounter (HOSPITAL_COMMUNITY): Payer: Self-pay | Admitting: Pharmacy Technician

## 2012-10-25 ENCOUNTER — Encounter (HOSPITAL_COMMUNITY)
Admission: RE | Admit: 2012-10-25 | Discharge: 2012-10-25 | Disposition: A | Discharge status: Home / Self Care | Payer: Medicaid Other | Source: Ambulatory Visit | Attending: Neurosurgery | Admitting: Neurosurgery

## 2012-10-25 ENCOUNTER — Encounter (HOSPITAL_COMMUNITY): Payer: Self-pay

## 2012-10-25 ENCOUNTER — Encounter (HOSPITAL_COMMUNITY): Admission: RE | Admit: 2012-10-25 | Payer: Medicaid Other | Source: Ambulatory Visit

## 2012-10-25 HISTORY — DX: Anemia, unspecified: D64.9

## 2012-10-25 HISTORY — DX: Cardiac murmur, unspecified: R01.1

## 2012-10-25 HISTORY — DX: Unspecified osteoarthritis, unspecified site: M19.90

## 2012-10-25 HISTORY — DX: Unspecified asthma, uncomplicated: J45.909

## 2012-10-25 HISTORY — DX: Gastro-esophageal reflux disease without esophagitis: K21.9

## 2012-10-25 LAB — BASIC METABOLIC PANEL
BUN: 9 mg/dL (ref 6–23)
CO2: 24 mEq/L (ref 19–32)
Chloride: 107 mEq/L (ref 96–112)
Creatinine, Ser: 0.75 mg/dL (ref 0.50–1.10)
GFR calc Af Amer: >90 mL/min (ref >90)
Glucose, Bld: 93 mg/dL (ref 70–99)
Potassium: 4.3 mEq/L (ref 3.5–5.1)

## 2012-10-25 LAB — CBC
HCT: 41.2 % (ref 36.0–46.0)
Hemoglobin: 14.3 g/dL (ref 12.0–15.0)
MCH: 31.8 pg (ref 26.0–34.0)
MCHC: 34.7 g/dL (ref 30.0–36.0)
MCV: 91.6 fL (ref 78.0–100.0)
RDW: 12.6 % (ref 11.5–15.5)

## 2012-10-25 LAB — SURGICAL PCR SCREEN: MRSA, PCR: NEGATIVE

## 2012-10-25 NOTE — Pre-Procedure Instructions (Signed)
Heidi Costa  10/25/2012   Your procedure is scheduled on:  10/27/2012  Report to [facility] at 9:45 AM.  ENTRANCE A  Call this number if you have problems the morning of surgery: [phone]   Remember:   Do not eat food or drink liquids after midnight. On WEDNESDAY   Take these medicines the morning of surgery with A SIP OF WATER: pain medicine if needed ---- is OK   Do not wear jewelry, make-up or nail polish.  Do not wear lotions, powders, or perfumes. You may wear deodorant.  Do not shave 48 hours prior to surgery.  Do not bring valuables to the hospital.  [facility] is not responsible                   for any belongings or valuables.  Contacts, dentures or bridgework may not be worn into surgery.  Leave suitcase in the car. After surgery it may be brought to your room.  For patients admitted to the hospital, checkout time is 11:00 AM the day of  discharge.   Patients discharged the day of surgery will not be allowed to drive  home.  Name and phone number of your driver: /w friend   Special Instructions: Shower using CHG 2 nights before surgery and the night before surgery.  If you shower the day of surgery use CHG.  Use special wash - you have one bottle of CHG for all showers.  You should use approximately 1/3 of the bottle for each shower.   Please read over the following fact sheets that you were given: Pain Booklet, Coughing and Deep Breathing, MRSA Information and Surgical Site Infection Prevention

## 2012-10-26 MED ORDER — CEFAZOLIN SODIUM-DEXTROSE 2-3 GM-% IV SOLR
2.0000 g | INTRAVENOUS | Status: AC
  Administered 2012-10-27: 2 g via INTRAVENOUS
  Filled 2012-10-26: qty 50

## 2012-10-26 MED ORDER — DEXAMETHASONE SODIUM PHOSPHATE 10 MG/ML IJ SOLN
10.0000 mg | INTRAMUSCULAR | Status: AC
  Administered 2012-10-27: 10 mg via INTRAVENOUS
  Filled 2012-10-26: qty 1

## 2012-10-26 NOTE — Progress Notes (Signed)
Anesthesia Chart Review:  Patient is a 32 year old female scheduled for C6-7 ACDF on 10/27/12 by Dr. Beverlyn Roux.  History includes obesity, smoking, anxiety, depression, GERD, anemia, murmur (not specified), asthma, arthritis. PCP is listed as Dr. Parke Simmers.  Preoperative labs noted.  Apparently, she did not get her CXR at her PAT visit, so it will need to be done on the day of surgery.  EKG on 10/25/12 showed SB @ 50 bpm with PACs. She reports a history of murmur.  I did not evaluate her at her PAT visit, but in previous exams in Epic since 2012 the providers documented that no significant murmurs were heard.  Of note, her BP on arrival to PAT was elevated, but was down to 130/87 when re-checked.  She will be evaluated by the anesthesiologist on the day of surgery, but if BP is reasonable and otherwise no acute changes then I would anticipate that she could proceed as planned.  Velna Ochs [facility]/Anesthesiology Phone [phone] 10/26/2012 9:39 AM

## 2012-10-27 ENCOUNTER — Encounter (HOSPITAL_COMMUNITY): Payer: Self-pay | Admitting: *Deleted

## 2012-10-27 ENCOUNTER — Encounter (HOSPITAL_COMMUNITY): Payer: Self-pay | Admitting: Vascular Surgery

## 2012-10-27 ENCOUNTER — Ambulatory Visit (HOSPITAL_COMMUNITY): Payer: Medicaid Other

## 2012-10-27 ENCOUNTER — Ambulatory Visit (HOSPITAL_COMMUNITY): Payer: Medicaid Other | Admitting: Anesthesiology

## 2012-10-27 ENCOUNTER — Encounter (HOSPITAL_COMMUNITY)
Admission: RE | Disposition: A | Discharge status: Home / Self Care | Payer: Self-pay | Source: Ambulatory Visit | Attending: Neurosurgery

## 2012-10-27 ENCOUNTER — Ambulatory Visit (HOSPITAL_COMMUNITY)
Admission: RE | Admit: 2012-10-27 | Discharge: 2012-10-28 | Disposition: A | Discharge status: Home / Self Care | Payer: Medicaid Other | Source: Ambulatory Visit | Attending: Neurosurgery | Admitting: Neurosurgery

## 2012-10-27 DIAGNOSIS — M5000 Cervical disc disorder with myelopathy, unspecified cervical region: Secondary | ICD-10-CM | POA: Insufficient documentation

## 2012-10-27 DIAGNOSIS — Z01810 Encounter for preprocedural cardiovascular examination: Secondary | ICD-10-CM | POA: Insufficient documentation

## 2012-10-27 DIAGNOSIS — Z79899 Other long term (current) drug therapy: Secondary | ICD-10-CM | POA: Insufficient documentation

## 2012-10-27 DIAGNOSIS — Z01812 Encounter for preprocedural laboratory examination: Secondary | ICD-10-CM | POA: Insufficient documentation

## 2012-10-27 DIAGNOSIS — Z23 Encounter for immunization: Secondary | ICD-10-CM | POA: Insufficient documentation

## 2012-10-27 HISTORY — PX: ANTERIOR CERVICAL DECOMP/DISCECTOMY FUSION: SHX1161

## 2012-10-27 SURGERY — ANTERIOR CERVICAL DECOMPRESSION/DISCECTOMY FUSION 1 LEVEL
Anesthesia: General | Site: Neck | Wound class: Clean

## 2012-10-27 MED ORDER — SODIUM CHLORIDE 0.9 % IR SOLN
Status: DC | PRN
  Administered 2012-10-27: 12:00:00

## 2012-10-27 MED ORDER — HEMOSTATIC AGENTS (NO CHARGE) OPTIME
TOPICAL | Status: DC | PRN
  Administered 2012-10-27: 1 via TOPICAL

## 2012-10-27 MED ORDER — CEFAZOLIN SODIUM-DEXTROSE 2-3 GM-% IV SOLR
2.0000 g | Freq: Three times a day (TID) | INTRAVENOUS | Status: AC
  Administered 2012-10-27 (×2): 2 g via INTRAVENOUS
  Filled 2012-10-27 (×2): qty 50

## 2012-10-27 MED ORDER — HYDROMORPHONE HCL PF 1 MG/ML IJ SOLN
INTRAMUSCULAR | Status: AC
  Filled 2012-10-27: qty 1

## 2012-10-27 MED ORDER — ONDANSETRON HCL 4 MG/2ML IJ SOLN: 4.0000 mg | INTRAMUSCULAR | Status: DC | PRN

## 2012-10-27 MED ORDER — ONDANSETRON HCL 4 MG/2ML IJ SOLN: 4.0000 mg | Freq: Once | INTRAMUSCULAR | Status: DC | PRN

## 2012-10-27 MED ORDER — OXYCODONE HCL 5 MG PO TABS: 5.0000 mg | ORAL_TABLET | Freq: Once | ORAL | Status: DC | PRN

## 2012-10-27 MED ORDER — OXYCODONE HCL 5 MG/5ML PO SOLN: 5.0000 mg | Freq: Once | ORAL | Status: DC | PRN

## 2012-10-27 MED ORDER — 0.9 % SODIUM CHLORIDE (POUR BTL) OPTIME
TOPICAL | Status: DC | PRN
  Administered 2012-10-27: 1000 mL

## 2012-10-27 MED ORDER — FENTANYL CITRATE 0.05 MG/ML IJ SOLN
INTRAMUSCULAR | Status: DC | PRN
  Administered 2012-10-27: 100 ug via INTRAVENOUS
  Administered 2012-10-27: 50 ug via INTRAVENOUS
  Administered 2012-10-27: 100 ug via INTRAVENOUS
  Administered 2012-10-27 (×2): 50 ug via INTRAVENOUS

## 2012-10-27 MED ORDER — LACTATED RINGERS IV SOLN
INTRAVENOUS | Status: DC
  Administered 2012-10-27: 10:00:00 via INTRAVENOUS

## 2012-10-27 MED ORDER — HYDROMORPHONE HCL PF 1 MG/ML IJ SOLN
0.2500 mg | INTRAMUSCULAR | Status: DC | PRN
  Administered 2012-10-27 (×4): 0.5 mg via INTRAVENOUS

## 2012-10-27 MED ORDER — MUPIROCIN 2 % EX OINT
TOPICAL_OINTMENT | Freq: Two times a day (BID) | CUTANEOUS | Status: DC
  Administered 2012-10-27 – 2012-10-28 (×2): via NASAL

## 2012-10-27 MED ORDER — ROCURONIUM BROMIDE 100 MG/10ML IV SOLN
INTRAVENOUS | Status: DC | PRN
  Administered 2012-10-27: 50 mg via INTRAVENOUS

## 2012-10-27 MED ORDER — ACETAMINOPHEN 650 MG RE SUPP: 650.0000 mg | RECTAL | Status: DC | PRN

## 2012-10-27 MED ORDER — LIDOCAINE HCL (CARDIAC) 20 MG/ML IV SOLN
INTRAVENOUS | Status: DC | PRN
  Administered 2012-10-27: 100 mg via INTRAVENOUS

## 2012-10-27 MED ORDER — SODIUM CHLORIDE 0.9 % IJ SOLN: 3.0000 mL | INTRAMUSCULAR | Status: DC | PRN

## 2012-10-27 MED ORDER — SODIUM CHLORIDE 0.9 % IV SOLN
INTRAVENOUS | Status: AC
  Filled 2012-10-27: qty 500

## 2012-10-27 MED ORDER — HYDROMORPHONE HCL PF 1 MG/ML IJ SOLN
1.0000 mg | INTRAMUSCULAR | Status: DC | PRN
  Administered 2012-10-27: 1.5 mg via INTRAMUSCULAR
  Filled 2012-10-27: qty 2

## 2012-10-27 MED ORDER — LACTATED RINGERS IV SOLN
INTRAVENOUS | Status: DC | PRN
  Administered 2012-10-27 (×2): via INTRAVENOUS

## 2012-10-27 MED ORDER — MEPERIDINE HCL 25 MG/ML IJ SOLN: 6.2500 mg | INTRAMUSCULAR | Status: DC | PRN

## 2012-10-27 MED ORDER — PHENOL 1.4 % MT LIQD: 1.0000 | OROMUCOSAL | Status: DC | PRN

## 2012-10-27 MED ORDER — MIDAZOLAM HCL 5 MG/5ML IJ SOLN
INTRAMUSCULAR | Status: DC | PRN
  Administered 2012-10-27: 2 mg via INTRAVENOUS

## 2012-10-27 MED ORDER — OXYCODONE-ACETAMINOPHEN 5-325 MG PO TABS
1.0000 | ORAL_TABLET | ORAL | Status: DC | PRN
  Administered 2012-10-27 – 2012-10-28 (×3): 2 via ORAL
  Filled 2012-10-27 (×3): qty 2

## 2012-10-27 MED ORDER — MENTHOL 3 MG MT LOZG: 1.0000 | LOZENGE | OROMUCOSAL | Status: DC | PRN

## 2012-10-27 MED ORDER — BACITRACIN 50000 UNITS IM SOLR
INTRAMUSCULAR | Status: AC
  Filled 2012-10-27: qty 1

## 2012-10-27 MED ORDER — THROMBIN 5000 UNITS EX SOLR
CUTANEOUS | Status: DC | PRN
  Administered 2012-10-27 (×2): 5000 [IU] via TOPICAL

## 2012-10-27 MED ORDER — ACETAMINOPHEN 325 MG PO TABS: 650.0000 mg | ORAL_TABLET | ORAL | Status: DC | PRN

## 2012-10-27 MED ORDER — CYCLOBENZAPRINE HCL 10 MG PO TABS
10.0000 mg | ORAL_TABLET | Freq: Three times a day (TID) | ORAL | Status: DC | PRN
  Administered 2012-10-27: 10 mg via ORAL
  Filled 2012-10-27: qty 1

## 2012-10-27 MED ORDER — ARTIFICIAL TEARS OP OINT
TOPICAL_OINTMENT | OPHTHALMIC | Status: DC | PRN
  Administered 2012-10-27: 1 via OPHTHALMIC

## 2012-10-27 MED ORDER — SODIUM CHLORIDE 0.9 % IJ SOLN
3.0000 mL | Freq: Two times a day (BID) | INTRAMUSCULAR | Status: DC
  Administered 2012-10-27 – 2012-10-28 (×2): 3 mL via INTRAVENOUS

## 2012-10-27 MED ORDER — LIDOCAINE HCL 4 % MT SOLN
OROMUCOSAL | Status: DC | PRN
  Administered 2012-10-27: 4 mL via TOPICAL

## 2012-10-27 MED ORDER — PNEUMOCOCCAL VAC POLYVALENT 25 MCG/0.5ML IJ INJ
0.5000 mL | INJECTION | INTRAMUSCULAR | Status: AC
  Administered 2012-10-28: 0.5 mL via INTRAMUSCULAR
  Filled 2012-10-27: qty 0.5

## 2012-10-27 MED ORDER — NEOSTIGMINE METHYLSULFATE 1 MG/ML IJ SOLN
INTRAMUSCULAR | Status: DC | PRN
  Administered 2012-10-27: 4 mg via INTRAVENOUS

## 2012-10-27 MED ORDER — GLYCOPYRROLATE 0.2 MG/ML IJ SOLN
INTRAMUSCULAR | Status: DC | PRN
  Administered 2012-10-27: 0.6 mg via INTRAVENOUS

## 2012-10-27 MED ORDER — KCL IN DEXTROSE-NACL 20-5-0.45 MEQ/L-%-% IV SOLN
80.0000 mL/h | INTRAVENOUS | Status: DC
  Filled 2012-10-27 (×3): qty 1000

## 2012-10-27 MED ORDER — PROPOFOL 10 MG/ML IV BOLUS
INTRAVENOUS | Status: DC | PRN
  Administered 2012-10-27: 120 mg via INTRAVENOUS

## 2012-10-27 MED ORDER — ONDANSETRON HCL 4 MG/2ML IJ SOLN
INTRAMUSCULAR | Status: DC | PRN
  Administered 2012-10-27: 4 mg via INTRAVENOUS

## 2012-10-27 SURGICAL SUPPLY — 62 items
BAG DECANTER FOR FLEXI CONT (MISCELLANEOUS) ×2 IMPLANT
BENZOIN TINCTURE PRP APPL 2/3 (GAUZE/BANDAGES/DRESSINGS) ×2 IMPLANT
BIT DRILL TRINICA 2.3MM (BIT) ×1 IMPLANT
BRUSH SCRUB EZ PLAIN DRY (MISCELLANEOUS) ×2 IMPLANT
BUR MATCHSTICK NEURO 3.0 LAGG (BURR) ×2 IMPLANT
CANISTER SUCTION 2500CC (MISCELLANEOUS) ×2 IMPLANT
CLOTH BEACON ORANGE TIMEOUT ST (SAFETY) ×2 IMPLANT
CONT SPEC 4OZ CLIKSEAL STRL BL (MISCELLANEOUS) ×2 IMPLANT
DRAPE C-ARM 42X72 X-RAY (DRAPES) ×4 IMPLANT
DRAPE LAPAROTOMY 100X72 PEDS (DRAPES) ×2 IMPLANT
DRAPE MICROSCOPE LEICA (MISCELLANEOUS) ×2 IMPLANT
DRAPE MICROSCOPE ZEISS OPMI (DRAPES) IMPLANT
DRAPE POUCH INSTRU U-SHP 10X18 (DRAPES) ×2 IMPLANT
DRAPE SURG 17X23 STRL (DRAPES) ×4 IMPLANT
DRESSING TELFA 8X3 (GAUZE/BANDAGES/DRESSINGS) ×2 IMPLANT
DRILL BIT TRINICA 2.3MM (BIT) ×2
DURAPREP 6ML APPLICATOR 50/CS (WOUND CARE) ×2 IMPLANT
ELECT COATED BLADE 2.86 ST (ELECTRODE) ×2 IMPLANT
ELECT REM PT RETURN 9FT ADLT (ELECTROSURGICAL) ×2
ELECTRODE REM PT RTRN 9FT ADLT (ELECTROSURGICAL) ×1 IMPLANT
GAUZE SPONGE 4X4 16PLY XRAY LF (GAUZE/BANDAGES/DRESSINGS) IMPLANT
GLOVE BIOGEL PI IND STRL 7.0 (GLOVE) ×5 IMPLANT
GLOVE BIOGEL PI IND STRL 7.5 (GLOVE) ×1 IMPLANT
GLOVE BIOGEL PI INDICATOR 7.0 (GLOVE) ×5
GLOVE BIOGEL PI INDICATOR 7.5 (GLOVE) ×1
GLOVE ECLIPSE 6.5 STRL STRAW (GLOVE) ×2 IMPLANT
GLOVE ECLIPSE 7.5 STRL STRAW (GLOVE) ×4 IMPLANT
GLOVE ECLIPSE 8.0 STRL XLNG CF (GLOVE) ×2 IMPLANT
GLOVE EXAM NITRILE LRG STRL (GLOVE) IMPLANT
GLOVE EXAM NITRILE XL STR (GLOVE) IMPLANT
GLOVE EXAM NITRILE XS STR PU (GLOVE) IMPLANT
GLOVE OPTIFIT SS 6.5 STRL BRWN (GLOVE) ×12 IMPLANT
GOWN BRE IMP SLV AUR LG STRL (GOWN DISPOSABLE) IMPLANT
GOWN BRE IMP SLV AUR XL STRL (GOWN DISPOSABLE) IMPLANT
GOWN STRL REIN 2XL LVL4 (GOWN DISPOSABLE) IMPLANT
HEAD HALTER (SOFTGOODS) ×2 IMPLANT
KIT BASIN OR (CUSTOM PROCEDURE TRAY) ×2 IMPLANT
KIT ROOM TURNOVER OR (KITS) ×2 IMPLANT
NEEDLE SPNL 20GX3.5 QUINCKE YW (NEEDLE) ×2 IMPLANT
NS IRRIG 1000ML POUR BTL (IV SOLUTION) ×2 IMPLANT
PACK LAMINECTOMY NEURO (CUSTOM PROCEDURE TRAY) ×2 IMPLANT
PAD ARMBOARD 7.5X6 YLW CONV (MISCELLANEOUS) ×2 IMPLANT
PATTIES SURGICAL .25X.25 (GAUZE/BANDAGES/DRESSINGS) IMPLANT
PATTIES SURGICAL .75X.75 (GAUZE/BANDAGES/DRESSINGS) ×2 IMPLANT
PLATE 22MM (Plate) ×2 IMPLANT
PUTTY BONE GRAFT KIT 2.5ML (Bone Implant) ×2 IMPLANT
RUBBERBAND STERILE (MISCELLANEOUS) ×4 IMPLANT
SCREW SD FIXED 12MM (Screw) ×8 IMPLANT
SPACER TMS 11X14X6MM (Spacer) ×2 IMPLANT
SPONGE GAUZE 4X4 12PLY (GAUZE/BANDAGES/DRESSINGS) ×2 IMPLANT
SPONGE INTESTINAL PEANUT (DISPOSABLE) ×2 IMPLANT
SPONGE SURGIFOAM ABS GEL SZ50 (HEMOSTASIS) ×2 IMPLANT
STRIP CLOSURE SKIN 1/2X4 (GAUZE/BANDAGES/DRESSINGS) ×2 IMPLANT
SUT PDS AB 5-0 P3 18 (SUTURE) ×2 IMPLANT
SUT VIC AB 3-0 CP2 18 (SUTURE) ×2 IMPLANT
SYR 20ML ECCENTRIC (SYRINGE) ×2 IMPLANT
TAPE CLOTH SURG 4X10 WHT LF (GAUZE/BANDAGES/DRESSINGS) ×2 IMPLANT
TAPE STRIPS DRAPE STRL (GAUZE/BANDAGES/DRESSINGS) ×2 IMPLANT
TOWEL OR 17X24 6PK STRL BLUE (TOWEL DISPOSABLE) ×2 IMPLANT
TOWEL OR 17X26 10 PK STRL BLUE (TOWEL DISPOSABLE) ×2 IMPLANT
TRAP SPECIMEN MUCOUS 40CC (MISCELLANEOUS) ×2 IMPLANT
WATER STERILE IRR 1000ML POUR (IV SOLUTION) ×2 IMPLANT

## 2012-10-27 NOTE — Anesthesia Procedure Notes (Signed)
Narrative

## 2012-10-27 NOTE — Op Note (Signed)
Preop diagnosis: Herniated disc C6-7 with spinal cord compression Postop diagnosis: Same Procedure: Decompressive C6-7 anterior cervical discectomy with trabecular metal interbody fusion and Trinica anterior cervical plating Surgeon: Charly Holcomb Assistant:cabbell  After being placed in the supine position and 10 pounds halter traction the patient's neck was prepped and draped in the usual sterile fashion. Localizing x-rays taken prior to incision to identify the appropriate level. Transverse incision was made in the right anterior neck started midline and headed towards the medial aspect the sternal cremaster muscle. The platysma muscle and incised transversely and the natural fascial plane between the strap muscles medially and the sternal cremaster laterally was identified and followed down to the interest of the cervical spine. Longus Cole muscles were identified split the midline and Triple-A bilaterally with Kitner section unit report coagulation. Subcutaneous tract was placed for exposure and x-ray showed approach the appropriate level. Using a 15 blade the ends disc at C6-7 and was incised. Using pituitary sugars and curettes approximately 90% of the disc material was removed. High-speed drill used to widen the interspace and bony shavings were saved for use later in the case. At this time the microscope was draped brought in the field and used for the remainder of the case. Using microsecond technique the remainder of the disc material down the posterior longitudinal ligament was removed. We was incised transversely. Large amounts of herniated disc identified causing marked compression of the spinal cord and proximal nerve root on the right. These removed in a piecemeal fashion gave excellent decompression of the spinal cord and proximal nerve roots bilaterally. At this time inspection was carried out in all directions any evidence of residual compression and none could be identified. Irrigation was  carried out and any bleeding control proper coagulation Gelfoam. Measurements were taken and a 6 mm lordotic graft was chosen and filled with a mixture of autologous bone morselized allograft. After confirming hemostasis the plug was impacted without difficulty and fossae showed to be in good position. An appropriately length Trinica anterior cervical plate was then chosen. Under fluoroscopic guidance drill holes were placed by by placing of 12 mm screws x4. Locking mechanism was rotated locked position. Irrigation was carried out any bleeding control proper coagulation Gelfoam. We did not get an x-ray this time because we had great difficulty seeing him this level do not see anything more and we a direct visualization which showed good placement of the plates screws and plugs. The was then closed with inverted Vicryl on the platysma muscle inverted PDS in the subcuticular layer and Steri-Strips on the skin. Shortness was then applied the patient was extubated and taken to recovery room in stable condition.

## 2012-10-27 NOTE — Preoperative (Signed)
Beta Blockers   Reason not to administer Beta Blockers:Not Applicable 

## 2012-10-27 NOTE — Anesthesia Preprocedure Evaluation (Signed)
Anesthesia Evaluation  Patient identified by MRN, date of birth, ID band Patient awake    Reviewed: Allergy & Precautions, H&P , Patient's Chart, lab work & pertinent test results  Airway Mallampati: I TM Distance: >3 FB Neck ROM: Full    Dental   Pulmonary asthma ,          Cardiovascular     Neuro/Psych    GI/Hepatic   Endo/Other    Renal/GU      Musculoskeletal   Abdominal   Peds  Hematology   Anesthesia Other Findings   Reproductive/Obstetrics                           Anesthesia Physical Anesthesia Plan  ASA: II  Anesthesia Plan: General   Post-op Pain Management:    Induction: Intravenous  Airway Management Planned: Oral ETT  Additional Equipment:   Intra-op Plan:   Post-operative Plan: Extubation in OR  Informed Consent: I have reviewed the patients History and Physical, chart, labs and discussed the procedure including the risks, benefits and alternatives for the proposed anesthesia with the patient or authorized representative who has indicated his/her understanding and acceptance.     Plan Discussed with: CRNA and Surgeon  Anesthesia Plan Comments:         Anesthesia Quick Evaluation

## 2012-10-27 NOTE — Plan of Care (Signed)
Problem: Consults Goal: Diagnosis - Spinal Surgery Outcome: Completed/Met Date Met:  10/27/12 Cervical Spine Fusion

## 2012-10-27 NOTE — Transfer of Care (Signed)
Immediate Anesthesia Transfer of Care Note  Patient: Heidi Costa  Procedure(s) Performed: Procedure(s) with comments: ANTERIOR CERVICAL DECOMPRESSION/DISCECTOMY FUSION 1 LEVEL (N/A) - ANTERIOR CERVICAL DECOMPRESSION/DISCECTOMY FUSION 1 LEVEL  Patient Location: PACU  Anesthesia Type:General  Level of Consciousness: awake, alert , oriented and sedated  Airway & Oxygen Therapy: Patient Spontanous Breathing and Patient connected to nasal cannula oxygen  Post-op Assessment: Report given to PACU RN, Post -op Vital signs reviewed and stable and Patient moving all extremities  Post vital signs: Reviewed and stable  Complications: No apparent anesthesia complications

## 2012-10-27 NOTE — H&P (Signed)
Heidi Costa is an 32 y.o. female.   Chief Complaint: Neck and right arm pain HPI: The patient is a 32 year old female who presented with neck and right arm pain. She had been tried on conservative therapy without improvement and then underwent an imaging study which showed a very large disc abnormality in her cervical spine. We discussed the options and because of the very large ruptured disc in the marked compression on the spinal cord she requested surgery now comes for an anterior cervical discectomy at C6-7 with interbody fusion and plating. I had a long discussion with her regarding the risks and benefits of surgical intervention. The risks discussed include but are not limited to bleeding infection weakness and paralysis spinal fluid leak coma quadriplegia hoarseness and death. We have discussed alternative methods of therapy offered risks and benefits of nonintervention. She's had the opportunity to ask numerous questions and appears to understand. With this information in hand she has requested we proceed with surgery.  Past Medical History  Diagnosis Date  . Bulging lumbar disc   . Anxiety   . Abnormality of gait 08/02/2012  . Chronic arthralgias of knees and hips 08/02/2012  . Obesity   . Depression     Pt. took self off Celexa one month ago, states she did n't like how it made her feel .  Marland Kitchen Asthma   . Heart murmur     pt. been told that this is a fact, no echo or cardiac surveillance in the past   . GERD (gastroesophageal reflux disease)   . Arthritis     spine- cerv.  & arm   . Anemia     iron taking in the past, not currently    Past Surgical History  Procedure Laterality Date  . Vaginal delivery  2002  . Chalazion excision      L eye     Family History  Problem Relation Age of Onset  . Osteoarthritis Mother   . Hypertension Mother   . Hypertension Father   . Gout Father   . Stroke Father    Social History:  reports that she has been smoking Cigarettes.  She has  a 10 pack-year smoking history. She has never used smokeless tobacco. She reports that  drinks alcohol. She reports that she uses illicit drugs (Marijuana).  Allergies:  Allergies  Allergen Reactions  . Aspirin Rash    Medications Prior to Admission  Medication Sig Dispense Refill  . acetaminophen (TYLENOL) 500 MG tablet Take 500-1,000 mg by mouth daily as needed for pain.       Marland Kitchen HYDROcodone-acetaminophen (NORCO/VICODIN) 5-325 MG per tablet Take 3 tablets by mouth 2 (two) times daily as needed for pain.      . Menthol, Topical Analgesic, (ICY HOT EX) Apply 1 application topically daily as needed (Pain).      . naproxen sodium (ANAPROX) 220 MG tablet Take 440 mg by mouth 2 (two) times daily with a meal.      . oxyCODONE-acetaminophen (ROXICET) 5-325 MG/5ML solution Take 10 mLs by mouth every 6 (six) hours as needed for pain.        Results for orders placed during the hospital encounter of 10/25/12 (from the past 48 hour(s))  BASIC METABOLIC PANEL     Status: None   Collection Time    10/25/12  2:00 PM      Result Value Range   Sodium 140  135 - 145 mEq/L   Potassium 4.3  3.5 -  5.1 mEq/L   Chloride 107  96 - 112 mEq/L   CO2 24  19 - 32 mEq/L   Glucose, Bld 93  70 - 99 mg/dL   BUN 9  6 - 23 mg/dL   Creatinine, Ser 0.86  0.50 - 1.10 mg/dL   Calcium 9.4  8.4 - 57.8 mg/dL   GFR calc non Af Amer >90  >90 mL/min   GFR calc Af Amer >90  >90 mL/min   Comment:            The eGFR has been calculated     using the CKD EPI equation.     This calculation has not been     validated in all clinical     situations.     eGFR's persistently     <90 mL/min signify     possible Chronic Kidney Disease.  CBC     Status: None   Collection Time    10/25/12  2:00 PM      Result Value Range   WBC 6.2  4.0 - 10.5 K/uL   RBC 4.50  3.87 - 5.11 MIL/uL   Hemoglobin 14.3  12.0 - 15.0 g/dL   HCT 46.9  62.9 - 52.8 %   MCV 91.6  78.0 - 100.0 fL   MCH 31.8  26.0 - 34.0 pg   MCHC 34.7  30.0 - 36.0  g/dL   RDW 41.3  24.4 - 01.0 %   Platelets 305  150 - 400 K/uL  HCG, SERUM, QUALITATIVE     Status: None   Collection Time    10/25/12  2:00 PM      Result Value Range   Preg, Serum NEGATIVE  NEGATIVE   Comment:            THE SENSITIVITY OF THIS     METHODOLOGY IS >10 mIU/mL.  SURGICAL PCR SCREEN     Status: Abnormal   Collection Time    10/25/12  2:01 PM      Result Value Range   MRSA, PCR NEGATIVE  NEGATIVE   Staphylococcus aureus POSITIVE (*) NEGATIVE   Comment:            The Xpert SA Assay (FDA     approved for NASAL specimens     in patients over 33 years of age),     is one component of     a comprehensive surveillance     program.  Test performance has     been validated by [employer] for patients greater     than or equal to 61 year old.     It is not intended     to diagnose infection nor to     guide or monitor treatment.   Dg Chest 2 View  10/27/2012   *RADIOLOGY REPORT*  Clinical Data: Preoperative cervical discectomy; history of heart murmur  CHEST - 2 VIEW  Comparison:  October 31, 2010  Findings:  Lungs clear.  Heart size and pulmonary vascularity are normal.  No adenopathy.  No bone lesions.  IMPRESSION: No abnormality noted.   Original Report Authenticated By: Bretta Bang, M.D.    Review of systems not obtained due to patient factors.  Blood pressure 132/85, pulse 87, temperature 98.3 F (36.8 C), temperature source Oral, resp. rate 20, last menstrual period 10/22/2012, SpO2 100.00%.  The patient is awake or and oriented. His no facial asymmetry. Her gait is nonantalgic. Reflexes are mildly increased  with positive Hoffmann's reflexes. 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 The plan is for an anterior cervical discectomy with fusion and  plating.  Reinaldo Meeker, MD 10/27/2012, 11:01 AM

## 2012-10-27 NOTE — Anesthesia Postprocedure Evaluation (Signed)
Anesthesia Post Note  Patient: Heidi Costa  Procedure(s) Performed: Procedure(s) (LRB): ANTERIOR CERVICAL DECOMPRESSION/DISCECTOMY FUSION 1 LEVEL (N/A)  Anesthesia type: general  Patient location: PACU  Post pain: Pain level controlled  Post assessment: Patient's Cardiovascular Status Stable  Last Vitals:  Filed Vitals:   10/27/12 1600  BP: 127/80  Pulse: 67  Temp: 37.1 C  Resp: 18    Post vital signs: Reviewed and stable  Level of consciousness: sedated  Complications: No apparent anesthesia complications

## 2012-10-28 ENCOUNTER — Encounter (HOSPITAL_COMMUNITY): Payer: Self-pay | Admitting: Neurosurgery

## 2012-10-28 MED ORDER — OXYCODONE-ACETAMINOPHEN 5-325 MG PO TABS: 1.0000 | ORAL_TABLET | ORAL | Status: DC | PRN

## 2012-10-28 NOTE — Discharge Summary (Signed)
  Physician Discharge Summary  Patient ID: Heidi Costa MRN: [id number] DOB/AGE: 1980/07/01 32 y.o.  Admit date: 10/27/2012 Discharge date: 10/28/2012  Admission Diagnoses:  Discharge Diagnoses:  Active Problems:   * No active hospital problems. *   Discharged Condition: good  Hospital Course: Surgery one day for acdf. Did well. No pain post op. Ambulation better. Home pod 1, specific instructions given.  Consults: None  Significant Diagnostic Studies: none  Treatments: surgery: C 67 acdf with plating  Discharge Exam: Blood pressure 112/75, pulse 71, temperature 97.8 F (36.6 C), temperature source Oral, resp. rate 18, last menstrual period 10/22/2012, SpO2 97.00%. Incision/Wound:clean and dry  Disposition: 01-Home or Self Care   Future Appointments Provider Department Dept Phone   05/08/2013 8:00 AM York Spaniel, MD [facility] [phone]       Medication List    ASK your doctor about these medications       acetaminophen 500 MG tablet  Commonly known as:  TYLENOL  Take 500-1,000 mg by mouth daily as needed for pain.     HYDROcodone-acetaminophen 5-325 MG per tablet  Commonly known as:  NORCO/VICODIN  Take 3 tablets by mouth 2 (two) times daily as needed for pain.     ICY HOT EX  Apply 1 application topically daily as needed (Pain).     naproxen sodium 220 MG tablet  Commonly known as:  ANAPROX  Take 440 mg by mouth 2 (two) times daily with a meal.     oxyCODONE-acetaminophen 5-325 MG/5ML solution  Commonly known as:  ROXICET  Take 10 mLs by mouth every 6 (six) hours as needed for pain.         At home rest most of the time. Get up 9 or 10 times each day and take a 15 or 20 minute walk. No riding in the car and to your first postoperative appointment. If you have neck surgery you may shower from the chest down starting on the third postoperative day. If you had back surgery he may start showering on the third postoperative day  with saran wrap wrapped around your incisional area 3 times. After the shower remove the saran wrap. Take pain medicine as needed and other medications as instructed. Call my office for an appointment.  SignedReinaldo Meeker, MD 10/28/2012, 12:39 PM

## 2012-10-28 NOTE — Progress Notes (Signed)
Pt. discharged home accompanied by family. Prescriptions and discharge instructions given with verbalization of understanding. Incision site on neck with no s/s of infection - no swelling, redness, bleeding, and/or drainage noted. Soft collar intact. Pain med given just before leaving. Opportunity given to ask questions but no question asked. Pt. transported out of this unit in wheelchair by the nurse tech. 

## 2013-01-20 ENCOUNTER — Telehealth: Payer: Self-pay | Admitting: *Deleted

## 2013-01-20 NOTE — Telephone Encounter (Addendum)
Patient is having pain in both knees,she is taking tylenol 500 mg 2-3 daily, votaren gel as needed. The surgeon(Dr Shawn Route) said that it will stop in time.  She recently had surgery(cervical disc)

## 2013-01-24 NOTE — Telephone Encounter (Signed)
Message copied by Hermenia Fiscal on Tue Jan 24, 2013  3:40 PM ------      Message from: Eugenie Birks      Created: Tue Jan 24, 2013 10:22 AM       Pt calling wondering why she has not been called back ------

## 2013-01-24 NOTE — Telephone Encounter (Signed)
I called patient. The patient has bilateral knee pain, and she has been referred to an orthopedic surgeon. The patient has had her cervical spine surgery. If the headaches are a significant issue, the patient is to call for a revisit appointment. I left a message.

## 2013-01-24 NOTE — Telephone Encounter (Signed)
I called pt and she stated that she continues with knee pain (bilaterally), also headaches (we have not seen for).    Has taken aleve, advil, tylenol.  She has seen her pcp , Dr Clyda Greener, Montez Hageman recently and is being referred to orthopedics, (no word yet about when).  Dr. Bruna Potter told her that she needs to lose weight.  No meds given.  I told her I would send message.  Her cell [phone]

## 2013-03-06 ENCOUNTER — Ambulatory Visit
Admission: RE | Admit: 2013-03-06 | Discharge: 2013-03-06 | Disposition: A | Discharge status: Home / Self Care | Payer: Medicaid Other | Source: Ambulatory Visit | Attending: Family Medicine | Admitting: Family Medicine

## 2013-03-06 ENCOUNTER — Other Ambulatory Visit: Payer: Self-pay | Admitting: Family Medicine

## 2013-03-06 DIAGNOSIS — F17200 Nicotine dependence, unspecified, uncomplicated: Secondary | ICD-10-CM

## 2013-05-08 ENCOUNTER — Encounter (INDEPENDENT_AMBULATORY_CARE_PROVIDER_SITE_OTHER): Payer: Self-pay

## 2013-05-08 ENCOUNTER — Ambulatory Visit (INDEPENDENT_AMBULATORY_CARE_PROVIDER_SITE_OTHER): Payer: Medicaid Other | Admitting: Neurology

## 2013-05-08 ENCOUNTER — Encounter: Payer: Self-pay | Admitting: Neurology

## 2013-05-08 ENCOUNTER — Telehealth: Payer: Self-pay | Admitting: Neurology

## 2013-05-08 VITALS — BP 139/86 | HR 84 | Wt 236.0 lb

## 2013-05-08 DIAGNOSIS — R209 Unspecified disturbances of skin sensation: Secondary | ICD-10-CM

## 2013-05-08 DIAGNOSIS — M4712 Other spondylosis with myelopathy, cervical region: Secondary | ICD-10-CM | POA: Insufficient documentation

## 2013-05-08 MED ORDER — GABAPENTIN 300 MG PO CAPS: ORAL_CAPSULE | ORAL | Status: DC

## 2013-05-08 NOTE — Telephone Encounter (Signed)
Patient calling to state she was seen by Dr. Jannifer Franklin today but forgot to mention she is taking Celexa, is wondering if it is okay to take with Gabapentin which Dr. Jannifer Franklin prescribed today. Please call and advise.

## 2013-05-08 NOTE — Telephone Encounter (Signed)
The patient is on 20 mg of Celexa. Okay to take the gabapentin.

## 2013-05-08 NOTE — Patient Instructions (Signed)
Myelography Myelography is an X-ray test that uses a special dye to look at your spine or neck. This test is usually done to look for:  Spinal cord injury.  Disk ruptures.  Fluid-filled pockets of tissue (cysts) on your spinal cord or nerve roots.  Tumors on your spinal cord or nerve roots. BEFORE THE PROCEDURE Arrange for someone to drive you home after the test.  PROCEDURE  You will lie on your stomach during the procedure.  Medicine may be given to you to help you relax.  A numbing medicine will be applied to area that they will be injecting you with a needle.  A needle will be inserted between two of your back bones.  A special machine will be used to help your doctor guide the needle into the sac that surrounds your spinal cord and nerves. A special dye will be injected into this sac.  The table you lie on may be moved around to make sure the dye moves all around your spinal cord and nerves.  Pictures the area will be taken by X-ray or CT. AFTER THE PROCEDURE  You will be taken to a recovery area.  You will lie flat with your head in a raised position. This is to prevent a severe headache. Document Released: 12/24/2007 Document Revised: 03/02/2012 Document Reviewed: 12/09/2011 [facility] Patient Information 2014 [place].

## 2013-05-08 NOTE — Progress Notes (Signed)
Reason for visit: Cervical myelopathy  Heidi Costa is an 33 y.o. 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 The patient had developed some sensory alteration in the legs, and a mild gait disturbance. The patient has done better with this since the surgery, but she has had persistent tingling and discomfort in the legs. The patient indicates a stiffness sensation in the legs, but she denies any problems controlling the bowels or the bladder. The patient does not have spasms in the legs at night. The patient denies any falls. The patient occasionally will have cramps in the right arm when she tries to open a bottle. The patient denies any significant neck discomfort at this time.  The patient indicates that the sensory alterations in the legs is bothersome to her at night. The patient is off of hydrocodone and oxycodone.  Past Medical History  Diagnosis Date  . Bulging lumbar disc   . Anxiety   . Abnormality of gait 08/02/2012  . Chronic arthralgias of knees and hips 08/02/2012  . Obesity   . Depression     Pt. took self off Celexa one month ago, states she did n't like how it made her feel .  Marland Kitchen Asthma   . Heart murmur     pt. been told that this is a fact, no echo or cardiac surveillance in the past   . GERD (gastroesophageal reflux disease)   . Arthritis     spine- cerv.  & arm   . Anemia     iron taking in the past, not currently    Past Surgical History  Procedure Laterality Date  . Vaginal delivery  2002  . Chalazion excision      L eye   . Anterior cervical decomp/discectomy fusion N/A 10/27/2012    Procedure: ANTERIOR CERVICAL DECOMPRESSION/DISCECTOMY FUSION 1 LEVEL;  Surgeon: Faythe Ghee, MD;  Location: [facility];  Service: Neurosurgery;  Laterality: N/A;  ANTERIOR CERVICAL DECOMPRESSION/DISCECTOMY FUSION 1 LEVEL     Family History  Problem Relation Age of Onset  . Osteoarthritis Mother   . Hypertension Mother   . Hypertension Father   . Gout Father   . Stroke Father     Social history:  reports that she has been smoking Cigarettes.  She has a 10 pack-year smoking history. She has never used smokeless tobacco. She reports that she drinks alcohol. She reports that she uses illicit drugs (Marijuana).    Allergies  Allergen Reactions  . Aspirin Rash    Medications:  Current Outpatient Prescriptions on File Prior to Visit  Medication Sig Dispense Refill  . acetaminophen (TYLENOL) 500 MG tablet Take 500-1,000 mg by mouth daily as needed for pain.        No current facility-administered medications on file prior to visit.    ROS:  Out of a complete 14 system review of symptoms, the patient complains only of the following symptoms, and all other reviewed systems are negative.  Neck pain  Restless legs Back pain, achy muscles, muscle cramps Agitation, depression  Blood pressure 139/86, pulse 84, weight 236 lb (107.049 kg).  Physical Exam  General: The patient is alert and cooperative at the time of the examination.  Skin: No significant peripheral edema is noted.   Neurologic Exam  Mental status: The patient is oriented x 3.  Cranial nerves:  Facial symmetry is present. Speech is normal, no aphasia or dysarthria is noted. Extraocular movements are full. Visual fields are full.  Motor: The patient has good strength in all 4 extremities. the patient is able to arise from a squatting position.   Sensory examination:soft touch sensation is symmetric on the face, arms, and legs.   Coordination: The patient has good finger-nose-finger and heel-to-shin bilaterally.  Gait and station: The patient has a normal gait. Tandem gait is normal. Romberg is negative. No drift is seen.  Reflexes: Deep tendon reflexes are symmetric, But the reflexes are somewhat brisk in the lower  extremities.   Assessment/Plan:  One. Cervical myelopathy, C 6-7 level   2. Lower extremity dysesthesias  The patient may have residual symptoms from spinal cord compression of had occurred before. The patient does have a malar rash, and she will need to be evaluated for possible lupus. The patient will be sent for blood work again, she never had the blood work done that was ordered back in May of 2014. The patient will be placed on gabapentin for the dysesthesias in the legs. In the past, the patient has had elevations in CK enzyme levels, if this is persistent, EMG and nerve conduction study evaluation will be done. Otherwise, the patient will followup in 4 months.   Jill Alexanders MD 05/08/2013 7:42 PM  [facility] [address] [place], [place] [address]  Phone [phone] Fax [phone]

## 2013-05-09 ENCOUNTER — Telehealth: Payer: Self-pay | Admitting: *Deleted

## 2013-05-09 ENCOUNTER — Telehealth: Payer: Self-pay | Admitting: Neurology

## 2013-05-09 DIAGNOSIS — G729 Myopathy, unspecified: Secondary | ICD-10-CM

## 2013-05-09 NOTE — Telephone Encounter (Signed)
I called the phone number, unable to leave a message. I called the cell number, this is not a functional number. The patient has a low B12 level, will have her come in for a shot, and then get her on oral medication. I'll have the nurses tried to call her during the day today. CK enzymes were also elevated, I will order an EMG study. Not all the blood work is back yet.

## 2013-05-09 NOTE — Telephone Encounter (Signed)
I called Heidi Costa and relayed the lab results for low B 12 and recommendation for one B12 1091mcg IM injection then oral supplement (same dosage) daily.  Also CK results and reccomendation for EMG.  May get these on the same day.   Order given to [place].

## 2013-05-09 NOTE — Telephone Encounter (Signed)
I called the patient, unable to reach her. I called the mother and talk with her. The patient apparently already got the message about coming in for a vitamin B12 shot, and we will get an EEG study set up as the CK enzyme levels remain elevated. Vitamin B12 levels were low, the serum copper level is still pending. ANA is positive, low titer, likely not clinically significant.

## 2013-05-10 LAB — ENA+DNA/DS+SJORGEN'S
ENA SSA (RO) Ab: <0.2 AI (ref 0.0–0.9)
ENA SSB (LA) Ab: 1.3 AI — ABNORMAL HIGH (ref 0.0–0.9)

## 2013-05-10 LAB — COPPER, SERUM: Copper: 108 ug/dL (ref 72–166)

## 2013-05-10 LAB — SEDIMENTATION RATE: SED RATE: 18 mm/h (ref 0–32)

## 2013-05-10 LAB — RHEUMATOID FACTOR: Rhuematoid fact SerPl-aCnc: <7 IU/mL (ref 0.0–13.9)

## 2013-05-10 LAB — ANGIOTENSIN CONVERTING ENZYME: Angio Convert Enzyme: 33 U/L (ref 14–82)

## 2013-05-10 LAB — ANA W/REFLEX: Anti Nuclear Antibody(ANA): POSITIVE — AB

## 2013-05-10 LAB — LYME, TOTAL AB TEST/REFLEX

## 2013-05-10 LAB — VITAMIN B12: Vitamin B-12: 203 pg/mL — ABNORMAL LOW (ref 211–946)

## 2013-05-10 LAB — HIV ANTIBODY (ROUTINE TESTING W REFLEX)
HIV 1/HIV 2 AB: NONREACTIVE
HIV 1/O/2 Abs-Index Value: <1 (ref <1.00)

## 2013-05-10 LAB — CK: CK TOTAL: 345 U/L — AB (ref 24–173)

## 2013-05-10 NOTE — Telephone Encounter (Signed)
Pt called this morning. She stated that she had missed 2 calls from Dr. Jannifer Franklin.  She said it was in regards to her lab results.  Please reference the 2 messages from Dr. Jannifer Franklin dated 05-09-13.  Please call [phone].  Thank you

## 2013-05-22 ENCOUNTER — Ambulatory Visit (INDEPENDENT_AMBULATORY_CARE_PROVIDER_SITE_OTHER): Payer: Medicaid Other | Admitting: Neurology

## 2013-05-22 ENCOUNTER — Encounter (INDEPENDENT_AMBULATORY_CARE_PROVIDER_SITE_OTHER): Payer: Self-pay

## 2013-05-22 DIAGNOSIS — G729 Myopathy, unspecified: Secondary | ICD-10-CM

## 2013-05-22 DIAGNOSIS — R209 Unspecified disturbances of skin sensation: Secondary | ICD-10-CM

## 2013-05-22 DIAGNOSIS — Z0289 Encounter for other administrative examinations: Secondary | ICD-10-CM

## 2013-05-22 DIAGNOSIS — E538 Deficiency of other specified B group vitamins: Secondary | ICD-10-CM

## 2013-05-22 MED ORDER — CYANOCOBALAMIN 1000 MCG/ML IJ SOLN
1000.0000 ug | Freq: Once | INTRAMUSCULAR | Status: AC
  Administered 2013-05-22: 1000 ug via INTRAMUSCULAR

## 2013-05-22 NOTE — Patient Instructions (Signed)
Patient will go on oral B12 supplement.

## 2013-05-22 NOTE — Procedures (Signed)
     HISTORY:  Heidi Costa is a 33 year old patient with a history of paresthesias in the extremities, some occasional muscle cramps. Blood work has shown a low vitamin B12 level, and an elevated CK enzyme level. ANA is positive, and there is a low positive SSB antibody. The patient is being evaluated for possible neuropathy or a myopathic disorder.  NERVE CONDUCTION STUDIES:  Nerve conduction studies were performed on the right upper extremity. The distal motor latencies and motor amplitudes for the median and ulnar nerves were within normal limits. The F wave latencies and nerve conduction velocities for these nerves were also normal. The sensory latencies for the median and ulnar nerves were normal.  Nerve conduction studies were performed on both lower extremities. The distal motor latencies and motor amplitudes for the peroneal and posterior tibial nerves were within normal limits. The nerve conduction velocities for these nerves were also normal. The H reflex latencies were normal. The sensory latencies for the peroneal nerves were within normal limits.   EMG STUDIES:  EMG study was performed on the right upper extremity:  The first dorsal interosseous muscle reveals 2 to 4 K units with full recruitment. No fibrillations or positive waves were noted. The abductor pollicis brevis muscle reveals 2 to 4 K units with full recruitment. No fibrillations or positive waves were noted. The extensor indicis proprius muscle reveals 1 to 3 K units with full recruitment. No fibrillations or positive waves were noted. The biceps muscle reveals 1 to 2 K units with full recruitment. No fibrillations or positive waves were noted. The triceps muscle reveals 2 to 4 K units with full recruitment. No fibrillations or positive waves were noted. The anterior deltoid muscle reveals 2 to 3 K units with full recruitment. No fibrillations or positive waves were noted. The cervical paraspinal muscles were  tested at 2 levels. No abnormalities of insertional activity were seen at either level tested. There was good relaxation.  EMG study was performed on the right lower extremity:  The tibialis anterior muscle reveals 2 to 4K motor units with full recruitment. No fibrillations or positive waves were seen. The peroneus tertius muscle reveals 2 to 4K motor units with full recruitment. No fibrillations or positive waves were seen. The medial gastrocnemius muscle reveals 1 to 3K motor units with full recruitment. No fibrillations or positive waves were seen. The vastus lateralis muscle reveals 2 to 4K motor units with full recruitment. No fibrillations or positive waves were seen. The iliopsoas muscle reveals 2 to 4K motor units with full recruitment. No fibrillations or positive waves were seen. The biceps femoris muscle (long head) reveals 2 to 4K motor units with full recruitment. No fibrillations or positive waves were seen. The lumbosacral paraspinal muscles were tested at 3 levels, and revealed no abnormalities of insertional activity at all 3 levels tested. There was good relaxation.   IMPRESSION:  Nerve conduction studies done on the left upper extremity and both lower extremities were within normal limits. No evidence of a peripheral neuropathy is seen. EMG evaluation of the right upper extremity and right lower extremity were normal. No evidence of a right cervical radiculopathy or a right lumbosacral radiculopathy was seen. There is no evidence of a myopathic disorder.  Jill Alexanders MD 05/22/2013 4:03 PM  [facility] [address] [place], [place] [address]  Phone [phone] Fax [phone]

## 2013-05-22 NOTE — Progress Notes (Signed)
Patient here for EMG/NCV test.  Doctor requested patient receive B12 injection.  Under aseptic technique Cyanocobalamin given IM in left deltoid.  Tolerated well.  Band-Aid applied.

## 2013-05-22 NOTE — Progress Notes (Signed)
Heidi Costa is a 33 year old patient with a history of elevations in CK enzyme levels. The patient has had persistent elevations in the muscle enzyme levels, and she also has reported some paresthesias of the extremities, occasional muscle cramps. The patient has had some blood work revealing a low B12 level, positive ANA with a low titer SSB antibody. The patient comes in for EMG and nerve conduction study.  Nerve conduction studies done on the right upper extremity and both n4 normal. No evidence of a peripheral neuropathy is seen. EMG of the right arm and right leg were normal, without evidence of a radiculopathy or evidence of a myopathic disorder.  The patient may have a benign CK enzyme elevation. The vitamin B12 levels could potentially explain the paresthesias. The patient will receive a vitamin B12 injection today, and she will go on supplementation orally taking 1000 mcg daily. We will follow the vitamin B12 levels over time. The patient will followup in 3-4 months.

## 2013-06-07 NOTE — Telephone Encounter (Signed)
Pt recd her referral for the neurosurgeon. Closing encounter

## 2013-06-12 ENCOUNTER — Telehealth: Payer: Self-pay | Admitting: Neurology

## 2013-06-12 NOTE — Telephone Encounter (Signed)
Pt has stopped taking gabapentin (NEURONTIN) 300 MG capsule due to personal reasons and now she is having back pain. Pt needs Dr. Jannifer Franklin or nurse to call her back concerning this matter.

## 2013-06-12 NOTE — Telephone Encounter (Signed)
I called patient. The patient indicates that she was having sexual side effects on gabapentin. Upon review of her medication list, the patient is on Celexa which has a very high rate of sexual side effects. I suspect this is the medication causing her issues. Gabapentin generally is not associated with sexual side effects. The patient may want to switch to Wellbutrin.

## 2013-08-26 ENCOUNTER — Emergency Department (INDEPENDENT_AMBULATORY_CARE_PROVIDER_SITE_OTHER)
Admission: EM | Admit: 2013-08-26 | Discharge: 2013-08-26 | Disposition: A | Discharge status: Home / Self Care | Payer: Medicaid Other | Source: Home / Self Care | Attending: Family Medicine | Admitting: Family Medicine

## 2013-08-26 ENCOUNTER — Emergency Department (INDEPENDENT_AMBULATORY_CARE_PROVIDER_SITE_OTHER): Payer: Medicaid Other

## 2013-08-26 ENCOUNTER — Encounter (HOSPITAL_COMMUNITY): Payer: Self-pay | Admitting: Emergency Medicine

## 2013-08-26 DIAGNOSIS — J209 Acute bronchitis, unspecified: Secondary | ICD-10-CM

## 2013-08-26 MED ORDER — GUAIFENESIN-CODEINE 100-10 MG/5ML PO SYRP: 10.0000 mL | ORAL_SOLUTION | Freq: Four times a day (QID) | ORAL | Status: DC | PRN

## 2013-08-26 MED ORDER — MINOCYCLINE HCL 100 MG PO CAPS: 100.0000 mg | ORAL_CAPSULE | Freq: Two times a day (BID) | ORAL | Status: DC

## 2013-08-26 NOTE — ED Provider Notes (Signed)
CSN: [id number]     Arrival date & time 08/26/13  1737 History   First MD Initiated Contact with Patient 08/26/13 1754     Chief Complaint  Patient presents with  . URI   (Consider location/radiation/quality/duration/timing/severity/associated sxs/prior Treatment) Patient is a 33 y.o. female presenting with URI. The history is provided by the patient.  URI Presenting symptoms: congestion, cough and rhinorrhea   Presenting symptoms: no fever and no sore throat   Severity:  Moderate Onset quality:  Gradual Duration:  3 days Progression:  Worsening Chronicity:  New Associated symptoms: wheezing   Risk factors comment:  Smoker   Past Medical History  Diagnosis Date  . Bulging lumbar disc   . Anxiety   . Abnormality of gait 08/02/2012  . Chronic arthralgias of knees and hips 08/02/2012  . Obesity   . Depression     Pt. took self off Celexa one month ago, states she did n't like how it made her feel .  Marland Kitchen Asthma   . Heart murmur     pt. been told that this is a fact, no echo or cardiac surveillance in the past   . GERD (gastroesophageal reflux disease)   . Arthritis     spine- cerv.  & arm   . Anemia     iron taking in the past, not currently   Past Surgical History  Procedure Laterality Date  . Vaginal delivery  2002  . Chalazion excision      L eye   . Anterior cervical decomp/discectomy fusion N/A 10/27/2012    Procedure: ANTERIOR CERVICAL DECOMPRESSION/DISCECTOMY FUSION 1 LEVEL;  Surgeon: Faythe Ghee, MD;  Location: [facility];  Service: Neurosurgery;  Laterality: N/A;  ANTERIOR CERVICAL DECOMPRESSION/DISCECTOMY FUSION 1 LEVEL   Family History  Problem Relation Age of Onset  . Osteoarthritis Mother   . Hypertension Mother   . Hypertension Father   . Gout Father   . Stroke Father    History  Substance Use Topics  . Smoking status: Current Every Day Smoker -- 1.00 packs/day for 10 years    Types: Cigarettes  . Smokeless tobacco: Never Used  . Alcohol Use: Yes      Comment: occas   OB History   Grav Para Term Preterm Abortions TAB SAB Ect Mult Living                 Review of Systems  Constitutional: Negative.  Negative for fever.  HENT: Positive for congestion, postnasal drip and rhinorrhea. Negative for sore throat.   Eyes: Negative.   Respiratory: Positive for cough and wheezing.   Cardiovascular: Negative.   Gastrointestinal: Negative.   Genitourinary: Negative.     Allergies  Aspirin  Home Medications   Prior to Admission medications   Medication Sig Start Date End Date Taking? Authorizing Provider  acetaminophen (TYLENOL) 500 MG tablet Take 500-1,000 mg by mouth daily as needed for pain.     Historical Provider, MD  citalopram (CELEXA) 20 MG tablet Take 20 mg by mouth daily.    Historical Provider, MD  gabapentin (NEURONTIN) 300 MG capsule Take one at night for one week, then take one capsule twice a day 05/08/13   Kathrynn Ducking, MD  guaiFENesin-codeine [facility]) 100-10 MG/5ML syrup Take 10 mLs by mouth 4 (four) times daily as needed for cough. 08/26/13   Billy Fischer, MD  minocycline (MINOCIN,DYNACIN) 100 MG capsule Take 1 capsule (100 mg total) by mouth 2 (two) times daily. 08/26/13  Billy Fischer, MD  vitamin B-12 (CYANOCOBALAMIN) 1000 MCG tablet Take 1,000 mcg by mouth daily.    Historical Provider, MD   LMP 08/05/2013 Physical Exam  Nursing note and vitals reviewed. Constitutional: She appears well-developed and well-nourished. No distress.  HENT:  Head: Normocephalic.  Right Ear: External ear normal.  Left Ear: External ear normal.  Mouth/Throat: Oropharynx is clear and moist.  Eyes: Pupils are equal, round, and reactive to light.  Neck: Normal range of motion. Neck supple.  Cardiovascular: Normal rate and normal heart sounds.   Pulmonary/Chest: Effort normal and breath sounds normal.  Abdominal: Soft. Bowel sounds are normal.  Lymphadenopathy:    She has no cervical adenopathy.  Skin: Skin is warm and dry.     ED Course  Procedures (including critical care time) Labs Review Labs Reviewed - No data to display  Imaging Review Dg Chest 2 View  08/26/2013   CLINICAL DATA:  Cough, fever  EXAM: CHEST  2 VIEW  COMPARISON:  03/06/2013  FINDINGS: There is no focal parenchymal opacity, pleural effusion, or pneumothorax. The heart and mediastinal contours are unremarkable.  Prior lower anterior cervical disc fusion.  IMPRESSION: No active cardiopulmonary disease.   Electronically Signed   By: Kathreen Devoid   On: 08/26/2013 18:15     MDM   1. Bronchitis, acute        Billy Fischer, MD 08/26/13 [phone]

## 2013-08-26 NOTE — ED Notes (Signed)
Pt c/o cold sx onset Thursday Sx include: dry cough, congestion, SOB, wheezing, CP due to cough Denies f/v/n/d Alert w/no signs of acute distress.

## 2013-08-26 NOTE — Discharge Instructions (Signed)
Take all of medicine, drink lots of fluids, no more smoking, see your doctor if further problems  °

## 2013-09-15 ENCOUNTER — Ambulatory Visit: Payer: Medicaid Other | Admitting: Nurse Practitioner

## 2013-09-25 ENCOUNTER — Telehealth: Payer: Self-pay | Admitting: Neurology

## 2013-09-25 MED ORDER — GABAPENTIN 300 MG PO CAPS: ORAL_CAPSULE | ORAL | Status: DC

## 2013-09-25 NOTE — Telephone Encounter (Signed)
I called patient. She is having some ongoing discomfort in the back and legs. The gabapentin seemed to help a little bit initially, but it is not effective at this time. She is only on 300 mg twice daily. I will go up to 300 mg in the morning, at midday, and 600 mg at night.

## 2013-09-25 NOTE — Telephone Encounter (Signed)
Patient states that she has been on gabapentin for approximately 8 months and it is not helping. She is in a lot of pain and would like a call back. Please call to advise.

## 2013-09-25 NOTE — Telephone Encounter (Signed)
Please advise previous note. Thanks  °

## 2013-11-13 ENCOUNTER — Emergency Department (HOSPITAL_COMMUNITY)
Admission: EM | Admit: 2013-11-13 | Discharge: 2013-11-13 | Disposition: A | Discharge status: Home / Self Care | Payer: BC Managed Care – PPO | Attending: Emergency Medicine | Admitting: Emergency Medicine

## 2013-11-13 ENCOUNTER — Telehealth: Payer: Self-pay | Admitting: Adult Health

## 2013-11-13 ENCOUNTER — Encounter (HOSPITAL_COMMUNITY): Payer: Self-pay | Admitting: Emergency Medicine

## 2013-11-13 ENCOUNTER — Emergency Department (HOSPITAL_COMMUNITY): Payer: BC Managed Care – PPO

## 2013-11-13 DIAGNOSIS — Z8719 Personal history of other diseases of the digestive system: Secondary | ICD-10-CM | POA: Insufficient documentation

## 2013-11-13 DIAGNOSIS — R011 Cardiac murmur, unspecified: Secondary | ICD-10-CM | POA: Insufficient documentation

## 2013-11-13 DIAGNOSIS — F17200 Nicotine dependence, unspecified, uncomplicated: Secondary | ICD-10-CM | POA: Diagnosis not present

## 2013-11-13 DIAGNOSIS — F329 Major depressive disorder, single episode, unspecified: Secondary | ICD-10-CM | POA: Insufficient documentation

## 2013-11-13 DIAGNOSIS — Z862 Personal history of diseases of the blood and blood-forming organs and certain disorders involving the immune mechanism: Secondary | ICD-10-CM | POA: Diagnosis not present

## 2013-11-13 DIAGNOSIS — E669 Obesity, unspecified: Secondary | ICD-10-CM | POA: Diagnosis not present

## 2013-11-13 DIAGNOSIS — F3289 Other specified depressive episodes: Secondary | ICD-10-CM | POA: Insufficient documentation

## 2013-11-13 DIAGNOSIS — Z792 Long term (current) use of antibiotics: Secondary | ICD-10-CM | POA: Diagnosis not present

## 2013-11-13 DIAGNOSIS — M47812 Spondylosis without myelopathy or radiculopathy, cervical region: Secondary | ICD-10-CM | POA: Diagnosis not present

## 2013-11-13 DIAGNOSIS — R52 Pain, unspecified: Secondary | ICD-10-CM | POA: Diagnosis not present

## 2013-11-13 DIAGNOSIS — M545 Low back pain, unspecified: Secondary | ICD-10-CM | POA: Diagnosis present

## 2013-11-13 DIAGNOSIS — J45909 Unspecified asthma, uncomplicated: Secondary | ICD-10-CM | POA: Insufficient documentation

## 2013-11-13 DIAGNOSIS — F411 Generalized anxiety disorder: Secondary | ICD-10-CM | POA: Diagnosis not present

## 2013-11-13 DIAGNOSIS — M19029 Primary osteoarthritis, unspecified elbow: Secondary | ICD-10-CM | POA: Insufficient documentation

## 2013-11-13 DIAGNOSIS — G8929 Other chronic pain: Secondary | ICD-10-CM | POA: Insufficient documentation

## 2013-11-13 DIAGNOSIS — Z79899 Other long term (current) drug therapy: Secondary | ICD-10-CM | POA: Diagnosis not present

## 2013-11-13 DIAGNOSIS — Z3202 Encounter for pregnancy test, result negative: Secondary | ICD-10-CM | POA: Insufficient documentation

## 2013-11-13 LAB — URINALYSIS, ROUTINE W REFLEX MICROSCOPIC
BILIRUBIN URINE: NEGATIVE
Glucose, UA: NEGATIVE mg/dL
Hgb urine dipstick: NEGATIVE
Ketones, ur: NEGATIVE mg/dL
Leukocytes, UA: NEGATIVE
Nitrite: NEGATIVE
PROTEIN: NEGATIVE mg/dL
Specific Gravity, Urine: 1.031 — ABNORMAL HIGH (ref 1.005–1.030)
UROBILINOGEN UA: 1 mg/dL (ref 0.0–1.0)
pH: 6.5 (ref 5.0–8.0)

## 2013-11-13 LAB — POC URINE PREG, ED: PREG TEST UR: NEGATIVE

## 2013-11-13 MED ORDER — OXYCODONE-ACETAMINOPHEN 5-325 MG PO TABS: 1.0000 | ORAL_TABLET | Freq: Three times a day (TID) | ORAL | Status: DC | PRN

## 2013-11-13 MED ORDER — OXYCODONE-ACETAMINOPHEN 5-325 MG PO TABS
1.0000 | ORAL_TABLET | Freq: Once | ORAL | Status: AC
  Administered 2013-11-13: 1 via ORAL
  Filled 2013-11-13: qty 1

## 2013-11-13 NOTE — ED Notes (Signed)
Patient transported to X-ray 

## 2013-11-13 NOTE — Discharge Instructions (Signed)
Please call your doctor for a followup appointment within 24-48 hours. When you talk to your doctor please let them know that you were seen in the emergency department and have them acquire all of your records so that they can discuss the findings with you and formulate a treatment plan to fully care for your new and ongoing problems. Please call and set-up an appointment with your primary care provider Please rest and stay hydrated  Please avoid any physical or strenuous activity  Please continue to wear back brace as needed Please massage with icy-hot ointment and apply warm compressions Please take medications as prescribed - while on pain medications there is to be no drinking alcohol, driving, operating any heavy machinery. If extra please dispose in a proper manner. Please do not take any extra Tylenol with this medication for this can lead to Tylenol overdose and liver issues.  Please continue to monitor symptoms closely and if symptoms are to worsen or change (fever greater than 101, chills, sweating, nausea, vomiting, chest pain, shortness of breath, difficulty breathing, weakness, numbness, injury, fall, inability to control, urine or bowel movements, changes to pain pattern) please report back to the emergency department.   Back Pain, Adult Back pain is very common. The pain often gets better over time. The cause of back pain is usually not dangerous. Most people can learn to manage their back pain on their own.  HOME CARE   Stay active. Start with short walks on flat ground if you can. Try to walk farther each day.  Do not sit, drive, or stand in one place for more than 30 minutes. Do not stay in bed.  Do not avoid exercise or work. Activity can help your back heal faster.  Be careful when you bend or lift an object. Bend at your knees, keep the object close to you, and do not twist.  Sleep on a firm mattress. Lie on your side, and bend your knees. If you lie on your back, put a  pillow under your knees.  Only take medicines as told by your doctor.  Put ice on the injured area.  Put ice in a plastic bag.  Place a towel between your skin and the bag.  Leave the ice on for 15-20 minutes, 03-04 times a day for the first 2 to 3 days. After that, you can switch between ice and heat packs.  Ask your doctor about back exercises or massage.  Avoid feeling anxious or stressed. Find good ways to deal with stress, such as exercise. GET HELP RIGHT AWAY IF:   Your pain does not go away with rest or medicine.  Your pain does not go away in 1 week.  You have new problems.  You do not feel well.  The pain spreads into your legs.  You cannot control when you poop (bowel movement) or pee (urinate).  Your arms or legs feel weak or lose feeling (numbness).  You feel sick to your stomach (nauseous) or throw up (vomit).  You have belly (abdominal) pain.  You feel like you may pass out (faint). MAKE SURE YOU:   Understand these instructions.  Will watch your condition.  Will get help right away if you are not doing well or get worse. Document Released: 09/02/2007 Document Revised: 06/08/2011 Document Reviewed: 07/18/2013 [facility] Patient Information 2015 [place], [place]. This information is not intended to replace advice given to you by your health care provider. Make sure you discuss any questions you have with  your health care provider.   Back Exercises Back exercises help treat and prevent back injuries. The goal is to increase your strength in your belly (abdominal) and back muscles. These exercises can also help with flexibility. Start these exercises when told by your doctor. HOME CARE Back exercises include: Pelvic Tilt.  Lie on your back with your knees bent. Tilt your pelvis until the lower part of your back is against the floor. Hold this position 5 to 10 sec. Repeat this exercise 5 to 10 times. Knee to Chest.  Pull 1 knee up against your chest and  hold for 20 to 30 seconds. Repeat this with the other knee. This may be done with the other leg straight or bent, whichever feels better. Then, pull both knees up against your chest. Sit-Ups or Curl-Ups.  Bend your knees 90 degrees. Start with tilting your pelvis, and do a partial, slow sit-up. Only lift your upper half 30 to 45 degrees off the floor. Take at least 2 to 3 seonds for each sit-up. Do not do sit-ups with your knees out straight. If partial sit-ups are difficult, simply do the above but with only tightening your belly (abdominal) muscles and holding it as told. Hip-Lift.  Lie on your back with your knees flexed 90 degrees. Push down with your feet and shoulders as you raise your hips 2 inches off the floor. Hold for 10 seconds, repeat 5 to 10 times. Back Arches.  Lie on your stomach. Prop yourself up on bent elbows. Slowly press on your hands, causing an arch in your low back. Repeat 3 to 5 times. Shoulder-Lifts.  Lie face down with arms beside your body. Keep hips and belly pressed to floor as you slowly lift your head and shoulders off the floor. Do not overdo your exercises. Be careful in the beginning. Exercises may cause you some mild back discomfort. If the pain lasts for more than 15 minutes, stop the exercises until you see your doctor. Improvement with exercise for back problems is slow.  Document Released: 04/18/2010 Document Revised: 06/08/2011 Document Reviewed: 01/15/2011 [facility] Patient Information 2015 [place], [place]. This information is not intended to replace advice given to you by your health care provider. Make sure you discuss any questions you have with your health care provider.

## 2013-11-13 NOTE — Telephone Encounter (Signed)
Patient calling to make [place] aware that her back has started hurting a lot and so she is going to the urgent care today. If questions, please call and advise.

## 2013-11-13 NOTE — ED Notes (Addendum)
Pt c/o off and on back pain but was worse yesterday. Pt has old back brace on does not normally wear that. Pt has buldging disc in lower back since 2007.

## 2013-11-13 NOTE — ED Provider Notes (Signed)
CSN: [id number]     Arrival date & time 11/13/13  1010 History   First MD Initiated Contact with Patient 11/13/13 1012     Chief Complaint  Patient presents with  . Back Pain     (Consider location/radiation/quality/duration/timing/severity/associated sxs/prior Treatment) The history is provided by the patient. No language interpreter was used.  Heidi Costa is a 33 y/o F with PMHx of bulging lumbar disc, neck pain with cervical fusion performed in July of 2014, anxiety, depression, obesity, heart murmur, asthma, GERD, arthritis presenting to the ED with back pain that has been ongoing on and off since 2007 with worsening since yesterday. Patient reported that the back pain is localized to the lower back described as an aching sensation with radiation to bilateral legs. Stated that she has been using her Gabapentin, Tylenol, icy hot ointment without much relief. Reported that she has been being seen by Dr. Jannifer Franklin who has been controlling her pain with Gabapentin and Vitamin B12 - stated that she has an appointment with him this month. Denied falls, injury, chest pain, shortness of breath, difficulty breathing, abdominal pain, numbness, tingling, loss sensation, weakness, urinary and bowel incontinence, fever, chills. PCP Dr. Kennon Holter Neurosurgeon Dr. Jannifer Franklin  Past Medical History  Diagnosis Date  . Bulging lumbar disc   . Anxiety   . Abnormality of gait 08/02/2012  . Chronic arthralgias of knees and hips 08/02/2012  . Obesity   . Depression     Pt. took self off Celexa one month ago, states she did n't like how it made her feel .  Marland Kitchen Asthma   . Heart murmur     pt. been told that this is a fact, no echo or cardiac surveillance in the past   . GERD (gastroesophageal reflux disease)   . Arthritis     spine- cerv.  & arm   . Anemia     iron taking in the past, not currently   Past Surgical History  Procedure Laterality Date  . Vaginal delivery  2002  . Chalazion excision      L eye     . Anterior cervical decomp/discectomy fusion N/A 10/27/2012    Procedure: ANTERIOR CERVICAL DECOMPRESSION/DISCECTOMY FUSION 1 LEVEL;  Surgeon: Faythe Ghee, MD;  Location: [facility];  Service: Neurosurgery;  Laterality: N/A;  ANTERIOR CERVICAL DECOMPRESSION/DISCECTOMY FUSION 1 LEVEL   Family History  Problem Relation Age of Onset  . Osteoarthritis Mother   . Hypertension Mother   . Hypertension Father   . Gout Father   . Stroke Father    History  Substance Use Topics  . Smoking status: Current Every Day Smoker -- 1.00 packs/day for 10 years    Types: Cigarettes  . Smokeless tobacco: Never Used  . Alcohol Use: Yes     Comment: occas   OB History   Grav Para Term Preterm Abortions TAB SAB Ect Mult Living                 Review of Systems  Constitutional: Negative for fever and chills.  Respiratory: Negative for chest tightness and shortness of breath.   Cardiovascular: Negative for chest pain.  Genitourinary: Negative for dysuria, urgency and frequency.  Musculoskeletal: Positive for back pain. Negative for arthralgias, neck pain and neck stiffness.  Neurological: Negative for weakness and numbness.      Allergies  Aspirin  Home Medications   Prior to Admission medications   Medication Sig Start Date End Date Taking? Authorizing Provider  acetaminophen (TYLENOL) 500 MG tablet Take 500-1,000 mg by mouth daily as needed for pain.     Historical Provider, MD  citalopram (CELEXA) 20 MG tablet Take 20 mg by mouth daily.    Historical Provider, MD  gabapentin (NEURONTIN) 300 MG capsule 1 capsule in the morning, at midday, and 2 capsules at night 09/25/13   Kathrynn Ducking, MD  guaiFENesin-codeine [facility]) 100-10 MG/5ML syrup Take 10 mLs by mouth 4 (four) times daily as needed for cough. 08/26/13   Billy Fischer, MD  minocycline (MINOCIN,DYNACIN) 100 MG capsule Take 1 capsule (100 mg total) by mouth 2 (two) times daily. 08/26/13   Billy Fischer, MD  vitamin B-12  (CYANOCOBALAMIN) 1000 MCG tablet Take 1,000 mcg by mouth daily.    Historical Provider, MD   BP 154/87  Pulse 81  Temp(Src) 97.8 F (36.6 C) (Oral)  Resp 18  SpO2 100%  LMP 11/05/2013 Physical Exam  Nursing note and vitals reviewed. Constitutional: She is oriented to person, place, and time. She appears well-developed and well-nourished. No distress.  HENT:  Head: Normocephalic and atraumatic.  Eyes: Conjunctivae and EOM are normal. Pupils are equal, round, and reactive to light. Right eye exhibits no discharge. Left eye exhibits no discharge.  Neck: Normal range of motion. Neck supple. No tracheal deviation present.  Negative neck stiffness Negative nuchal rigidity  Negative cervical lymphadenopathy  Negative pain upon palpation to the c-spine   Cardiovascular: Normal rate and regular rhythm.   No murmur heard. Pulses:      Radial pulses are 2+ on the right side, and 2+ on the left side.       Dorsalis pedis pulses are 2+ on the right side, and 2+ on the left side.  Pulmonary/Chest: Effort normal and breath sounds normal. No respiratory distress. She has no wheezes. She has no rales. She exhibits no tenderness.  Musculoskeletal: Normal range of motion. She exhibits tenderness. She exhibits no edema.       Lumbar back: She exhibits tenderness and bony tenderness. She exhibits normal range of motion, no swelling, no edema, no deformity, no laceration and no pain.       Back:  Negative deformities or malalignments noted to the lumbosacral spine. Discomfort upon palpation to the mid-lumbosacral spine and paravertebral regions bilaterally.   Full ROM to upper and lower extremities bilaterally without difficulty noted  Lymphadenopathy:    She has no cervical adenopathy.  Neurological: She is alert and oriented to person, place, and time. No cranial nerve deficit. She exhibits normal muscle tone. Coordination normal.  Cranial nerves III-XII grossly intact Strength 5+/5+ to upper and  lower extremities bilaterally with resistance applied, equal distribution noted Strength intact to MCP, PIP, DIP joints of bilateral hands Negative saddle paresthesias bilaterally  Patient able to bring finger to nose bilaterally without difficulty or ataxia Negative facial drooping Negative slurred speech  Negative aphasia Negative arm drift Fine motor skills intact Gait proper, proper balance - negative sway, negative drift, negative step-offs  Skin: Skin is warm and dry. No rash noted. She is not diaphoretic. No erythema.  Psychiatric: She has a normal mood and affect. Her behavior is normal.    ED Course  Procedures (including critical care time)  10:53 AM Patient reported that she has taken Percocet in the past. Patient reported that she has a ride home, stated that she did not drive, someone dropped her off.   Results for orders placed during the hospital encounter of 11/13/13  URINALYSIS, ROUTINE W REFLEX MICROSCOPIC      Result Value Ref Range   Color, Urine YELLOW  YELLOW   APPearance CLEAR  CLEAR   Specific Gravity, Urine 1.031 (*) 1.005 - 1.030   pH 6.5  5.0 - 8.0   Glucose, UA NEGATIVE  NEGATIVE mg/dL   Hgb urine dipstick NEGATIVE  NEGATIVE   Bilirubin Urine NEGATIVE  NEGATIVE   Ketones, ur NEGATIVE  NEGATIVE mg/dL   Protein, ur NEGATIVE  NEGATIVE mg/dL   Urobilinogen, UA 1.0  0.0 - 1.0 mg/dL   Nitrite NEGATIVE  NEGATIVE   Leukocytes, UA NEGATIVE  NEGATIVE  POC URINE PREG, ED      Result Value Ref Range   Preg Test, Ur NEGATIVE  NEGATIVE    Labs Review Labs Reviewed  URINALYSIS, ROUTINE W REFLEX MICROSCOPIC - Abnormal; Notable for the following:    Specific Gravity, Urine 1.031 (*)    All other components within normal limits  POC URINE PREG, ED    Imaging Review Dg Lumbar Spine Complete  11/13/2013   CLINICAL DATA:  Back pain.  EXAM: LUMBAR SPINE - COMPLETE 4+ VIEW  COMPARISON:  Lumbar MRI 08/13/2012.  FINDINGS: Paraspinal soft tissues are normal. No  acute abnormality identified. Mild diffuse degenerative change. Normal mineralization. No osteopenia . Normal alignment.  IMPRESSION: Mild diffuse degenerative change.  No acute abnormality .   Electronically Signed   By: Marcello Moores  Register   On: 11/13/2013 11:25     EKG Interpretation None      MDM   Final diagnoses:  Acute exacerbation of chronic low back pain    Medications  oxyCODONE-acetaminophen (PERCOCET/ROXICET) 5-325 MG per tablet 1 tablet (1 tablet Oral Given 11/13/13 1105)    Filed Vitals:   11/13/13 1019  BP: 154/87  Pulse: 81  Temp: 97.8 F (36.6 C)  TempSrc: Oral  Resp: 18  SpO2: 100%   Urine pregnancy negative. Urinalysis negative for hemoglobin, nitrites, leukocytes-negative infection. Plain film of lumbar spine noted mild diffuse degenerative change with no acute abnormality. Doubt pyelonephritis. Doubt UTI. Doubt epidural abscess. Doubt cauda equina. Patient neurologically intact. Negative focal neurological deficits. Pulses palpable. Gait proper with-negative step-offs or sway. Negative red flags. Suspicion high for acute exacerbation of chronic back pain. Patient stable, afebrile. Patient not septic appearing. Discharged patient. Discharged patient with small dose of pain medications-discussed course, precautions, disposal technique. Referred to PCP and neurosurgeon. Discussed with patient to keep brace on at all times. Discussed with patient to closely monitor symptoms and if symptoms are to worsen or change to report back to the ED - strict return instructions given.  Patient agreed to plan of care, understood, all questions answered.   Jamse Mead, PA-C 11/13/13 1200

## 2013-11-14 ENCOUNTER — Encounter: Payer: Self-pay | Admitting: Adult Health

## 2013-11-14 ENCOUNTER — Ambulatory Visit (INDEPENDENT_AMBULATORY_CARE_PROVIDER_SITE_OTHER): Payer: BC Managed Care – PPO | Admitting: Adult Health

## 2013-11-14 VITALS — BP 140/95 | HR 69 | Ht 66.75 in | Wt 242.0 lb

## 2013-11-14 DIAGNOSIS — M4712 Other spondylosis with myelopathy, cervical region: Secondary | ICD-10-CM

## 2013-11-14 DIAGNOSIS — G8929 Other chronic pain: Secondary | ICD-10-CM

## 2013-11-14 DIAGNOSIS — M25559 Pain in unspecified hip: Secondary | ICD-10-CM

## 2013-11-14 DIAGNOSIS — R209 Unspecified disturbances of skin sensation: Secondary | ICD-10-CM

## 2013-11-14 DIAGNOSIS — M545 Low back pain, unspecified: Secondary | ICD-10-CM

## 2013-11-14 DIAGNOSIS — IMO0001 Reserved for inherently not codable concepts without codable children: Secondary | ICD-10-CM

## 2013-11-14 MED ORDER — TRAMADOL HCL 50 MG PO TABS: 50.0000 mg | ORAL_TABLET | Freq: Four times a day (QID) | ORAL | Status: DC | PRN

## 2013-11-14 NOTE — ED Provider Notes (Signed)
Medical screening examination/treatment/procedure(s) were performed by non-physician practitioner and as supervising physician I was immediately available for consultation/collaboration.   EKG Interpretation None        Tanna Furry, MD 11/14/13 [phone]

## 2013-11-14 NOTE — Patient Instructions (Signed)

## 2013-11-14 NOTE — Telephone Encounter (Signed)
FYI- Patient states she is going to the urgent care for back pain on yesterday.

## 2013-11-14 NOTE — Progress Notes (Signed)
PATIENT: Heidi Costa DOB: 15-Mar-1981  REASON FOR VISIT: follow up HISTORY FROM: patient  HISTORY OF PRESENT ILLNESS: Heidi Costa is a 33 year old female with a history of cervical myelopathy at the C6-7 level. She had a surgical decompression. She returns today for followup. She had some sensory alteration in the legs and a mild gait disturbance.  She states this has remained the same. She has some stiffness in the legs but only around the knees. Denies any falls. She takes gabapentin and states that this really helps the discomfort in her legs.  She also had EMG study due to elevated muscle enzymes and it was unremarkable. She went to the hospital this past Sunday for severe low back pain. She has a history of chronic low back pain. They did an x-ray and gave her percocet for the pain. She has been seen by Dr. Hal Neer regarding her back and at that time he stated that " the two bulging discs are not bad enough for surgery." she states that if she doesn't take something for pain, she will hardly be able to walk.   HISTORY 05/08/13 (CW):  history of a cervical myelopathy at the C 6-7 level, status post surgical decompression. The patient had developed some sensory alteration in the legs, and a mild gait disturbance. The patient has done better with this since the surgery, but she has had persistent tingling and discomfort in the legs. The patient indicates a stiffness sensation in the legs, but she denies any problems controlling the bowels or the bladder. The patient does not have spasms in the legs at night. The patient denies any falls. The patient occasionally will have cramps in the right arm when she tries to open a bottle. The patient denies any significant neck discomfort at this time. The patient indicates that the sensory alterations in the legs is bothersome to her at night. The patient is off of hydrocodone and oxycodone.   REVIEW OF SYSTEMS: Full 14 system review of systems performed  and notable only for:  Constitutional: N/A  Eyes: N/A Ear/Nose/Throat: N/A  Skin: N/A  Cardiovascular: N/A  Respiratory: N/A  Gastrointestinal: N/A  Genitourinary: N/A Hematology/Lymphatic: N/A  Endocrine: N/A Musculoskeletal: Back pain, aching muscles, walking difficulty, neck pain  Allergy/Immunology: N/A  Neurological: N/A Psychiatric: agitation, depression Sleep: snoring   ALLERGIES: Allergies  Allergen Reactions  . Aspirin Rash    HOME MEDICATIONS: Outpatient Prescriptions Prior to Visit  Medication Sig Dispense Refill  . acetaminophen (TYLENOL) 500 MG tablet Take 500-1,000 mg by mouth daily as needed for pain.       Marland Kitchen gabapentin (NEURONTIN) 300 MG capsule 1 capsule in the morning, at midday, and 2 capsules at night  120 capsule  5  . oxyCODONE-acetaminophen (PERCOCET/ROXICET) 5-325 MG per tablet Take 1 tablet by mouth every 8 (eight) hours as needed for moderate pain or severe pain.  5 tablet  0  . vitamin B-12 (CYANOCOBALAMIN) 1000 MCG tablet Take 1,000 mcg by mouth daily.      . citalopram (CELEXA) 20 MG tablet Take 20 mg by mouth daily.      Marland Kitchen guaiFENesin-codeine (ROBITUSSIN AC) 100-10 MG/5ML syrup Take 10 mLs by mouth 4 (four) times daily as needed for cough.  180 mL  0  . minocycline (MINOCIN,DYNACIN) 100 MG capsule Take 1 capsule (100 mg total) by mouth 2 (two) times daily.  14 capsule  0   No facility-administered medications prior to visit.    PAST  MEDICAL HISTORY: Past Medical History  Diagnosis Date  . Bulging lumbar disc   . Anxiety   . Abnormality of gait 08/02/2012  . Chronic arthralgias of knees and hips 08/02/2012  . Obesity   . Depression     Pt. took self off Celexa one month ago, states she did n't like how it made her feel .  Marland Kitchen Asthma   . Heart murmur     pt. been told that this is a fact, no echo or cardiac surveillance in the past   . GERD (gastroesophageal reflux disease)   . Arthritis     spine- cerv.  & arm   . Anemia     iron taking in  the past, not currently    PAST SURGICAL HISTORY: Past Surgical History  Procedure Laterality Date  . Vaginal delivery  2002  . Chalazion excision      L eye   . Anterior cervical decomp/discectomy fusion N/A 10/27/2012    Procedure: ANTERIOR CERVICAL DECOMPRESSION/DISCECTOMY FUSION 1 LEVEL;  Surgeon: Faythe Ghee, MD;  Location: [facility];  Service: Neurosurgery;  Laterality: N/A;  ANTERIOR CERVICAL DECOMPRESSION/DISCECTOMY FUSION 1 LEVEL    FAMILY HISTORY: Family History  Problem Relation Age of Onset  . Osteoarthritis Mother   . Hypertension Mother   . Hypertension Father   . Gout Father   . Stroke Father     SOCIAL HISTORY: History   Social History  . Marital Status: Married    Spouse Name: N/A    Number of Children: 1  . Years of Education: associates   Occupational History  .  Other   Social History Main Topics  . Smoking status: Current Every Day Smoker -- 1.00 packs/day for 10 years    Types: Cigarettes  . Smokeless tobacco: Never Used  . Alcohol Use: Yes     Comment: occas  . Drug Use: Yes    Special: Marijuana  . Sexual Activity: Yes    Birth Control/ Protection: None   Other Topics Concern  . Not on file   Social History Narrative  . No narrative on file      PHYSICAL EXAM  Filed Vitals:   11/14/13 0837  BP: 140/95  Pulse: 69  Height: 5' 6.75" (1.695 m)  Weight: 242 lb (109.77 kg)   Body mass index is 38.21 kg/(m^2).  Generalized: Well developed, in no acute distress   Neurological examination  Mentation: Alert oriented to time, place, history taking. Follows all commands speech and language fluent Cranial nerve II-XII: Pupils were equal round reactive to light. Extraocular movements were full, visual field were full on confrontational test. Facial sensation and strength were normal. hearing was intact to finger rubbing bilaterally. Uvula tongue midline. Head turning and shoulder shrug  were normal and symmetric. Motor: The motor  testing reveals 5 over 5 strength of all 4 extremities. Good symmetric motor tone is noted throughout.  Sensory: Sensory testing is intact to soft touch on all 4 extremities. No evidence of extinction is noted.  Coordination: Cerebellar testing reveals good finger-nose-finger and heel-to-shin bilaterally.  Gait and station: Gait is slow and cautious. Tandem gait is normal. Romberg is negative. No drift is seen. Pain noted with flexion of spine. Denies pain with extension or rotation of the back.  Reflexes: Deep tendon reflexes are symmetric and normal bilaterally.    DIAGNOSTIC DATA (LABS, IMAGING, TESTING) - I reviewed patient records, labs, notes, testing and imaging myself where available.  Lab Results  Component Value Date   WBC 6.2 10/25/2012   HGB 14.3 10/25/2012   HCT 41.2 10/25/2012   MCV 91.6 10/25/2012   PLT 305 10/25/2012      Component Value Date/Time   NA 140 10/25/2012 1400   K 4.3 10/25/2012 1400   CL 107 10/25/2012 1400   CO2 24 10/25/2012 1400   GLUCOSE 93 10/25/2012 1400   BUN 9 10/25/2012 1400   CREATININE 0.75 10/25/2012 1400   CALCIUM 9.4 10/25/2012 1400   GFRNONAA >90 10/25/2012 1400   GFRAA >90 10/25/2012 1400     Lab Results  Component Value Date   VITAMINB12 203* 05/08/2013       ASSESSMENT AND PLAN 33 y.o. year old female  has a past medical history of Bulging lumbar disc; Anxiety; Abnormality of gait (08/02/2012); Chronic arthralgias of knees and hips (08/02/2012); Obesity; Depression; Asthma; Heart murmur; GERD (gastroesophageal reflux disease); Arthritis; and Anemia. here with:  1. chronic low back pain 2. Cervical spondylosis with myelopathy 3. Sensory alterations 4. Arthralgia of knees and hips  Patient states that the gabapentin is working well for her sensory alterations. Her complaint today is with the chronic low back pain. She states on Sunday she had to go to the emergency room she was in severe pain. There they did an x-ray of the lumbar spine and  gave her 5 tablets of Percocet. The patient feels that she needs pain medicine  in order to walk especially when she has a flareup of her pain. She has seen Dr. Hal Neer in the past regarding her lumbar spine. At that time he felt that she was not a candidate for surgery. I advised the patient that I would not refill the Percocet however I will give her Ultram. The patient has tried Ultram in the past with some relief. I will also refer the patient to a pain clinic. I advised the patient that if she did not get relief from the pain clinic and she should consider seeing Dr. Hal Neer again. The patient should followup with [place] in 6 months or sooner if needed.   Ward Givens, MSN, NP-C 11/14/2013, 8:42 AM [facility] [address], [place], [place] [address] [phone]  Note: This document was prepared with digital dictation and possible smart phrase technology. Any transcriptional errors that result from this process are unintentional.

## 2013-11-14 NOTE — Telephone Encounter (Signed)
I saw the patient in the clinic yesterday.

## 2013-11-15 MED ORDER — PREDNISONE 5 MG PO TABS: ORAL_TABLET | ORAL | Status: DC

## 2013-11-15 MED ORDER — METHOCARBAMOL 500 MG PO TABS: 500.0000 mg | ORAL_TABLET | Freq: Three times a day (TID) | ORAL | Status: DC

## 2013-11-15 NOTE — Telephone Encounter (Signed)
I called patient. She has had some issues with an exacerbation of back pain since 4 days ago. The patient indicates that it is difficult for function, difficult to walk. She was seen yesterday in our office. I will give her a prescription for a prednisone Dosepak, and get her on Robaxin for several days. She has been referred to a pain Center for management of her back pain. She has seen Dr. Hal Neer for her back problems previously, and she was not felt to be a surgical candidate.   MRI lumbar 08/15/12:  IMPRESSION: Abnormal MRI scan of the lumbar spine showing mild spondylitic changes at L3-4, L4-5 and L5-S1 with asymmetric facet arthropathy resulting in left greater than right foramina narrowing but without significant compression.

## 2013-11-15 NOTE — Telephone Encounter (Signed)
Patient calling to state that she is still taking her medications but since Sunday, she has had a flare up in her back and is in a lot of pain, states that she can't walk. Patient has been taking the Tramadol that was given to her yesterday but it hasn't helped. Please call patient and advise.

## 2013-11-29 ENCOUNTER — Telehealth: Payer: Self-pay | Admitting: *Deleted

## 2013-11-29 NOTE — Telephone Encounter (Signed)
Called patient because patient was referred to Dr Ace Gins and was scheduled on 11/20/13 patient no showed for this appointment and has failed to return any of their attempts at contacting them, called patient to see what happened, no answer left message to call Dr. Unice Bailey office to r/s appointment or she can call me back.

## 2014-02-20 ENCOUNTER — Telehealth: Payer: Self-pay | Admitting: Adult Health

## 2014-02-20 NOTE — Telephone Encounter (Signed)
Just wanted to give [place] an update. Pt no showed on 11/20/13 and called and cancelled for 02/20/14. She now owes them a no show fee before the would be able to schedule.

## 2014-02-20 NOTE — Telephone Encounter (Signed)
Noted. Thanks.

## 2014-04-04 ENCOUNTER — Emergency Department (HOSPITAL_COMMUNITY)
Admission: EM | Admit: 2014-04-04 | Discharge: 2014-04-04 | Disposition: A | Discharge status: Home / Self Care | Payer: BLUE CROSS/BLUE SHIELD | Source: Home / Self Care

## 2014-04-04 ENCOUNTER — Encounter (HOSPITAL_COMMUNITY): Payer: Self-pay | Admitting: Emergency Medicine

## 2014-04-04 DIAGNOSIS — T2220XA Burn of second degree of shoulder and upper limb, except wrist and hand, unspecified site, initial encounter: Secondary | ICD-10-CM

## 2014-04-04 DIAGNOSIS — Z23 Encounter for immunization: Secondary | ICD-10-CM

## 2014-04-04 MED ORDER — TETANUS-DIPHTH-ACELL PERTUSSIS 5-2.5-18.5 LF-MCG/0.5 IM SUSP
0.5000 mL | Freq: Once | INTRAMUSCULAR | Status: AC
  Administered 2014-04-04: 0.5 mL via INTRAMUSCULAR

## 2014-04-04 MED ORDER — SILVER SULFADIAZINE 1 % EX CREA
TOPICAL_CREAM | CUTANEOUS | Status: AC
  Filled 2014-04-04: qty 85

## 2014-04-04 MED ORDER — TETANUS-DIPHTH-ACELL PERTUSSIS 5-2.5-18.5 LF-MCG/0.5 IM SUSP
INTRAMUSCULAR | Status: AC
  Filled 2014-04-04: qty 0.5

## 2014-04-04 MED ORDER — SILVER SULFADIAZINE 1 % EX CREA: TOPICAL_CREAM | Freq: Every day | CUTANEOUS | Status: DC

## 2014-04-04 MED ORDER — SILVER SULFADIAZINE 1 % EX CREA
TOPICAL_CREAM | Freq: Once | CUTANEOUS | Status: AC
  Administered 2014-04-04: 15:00:00 via TOPICAL
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
## 2014-04-04 NOTE — Discharge Instructions (Signed)
Thank you for coming in today. Apply Silvadene cream and a dressing daily Return or follow up with primary care provider in one week for recheck Go to the emergency room if you get worse   Burn Care Your skin is a natural barrier to infection. It is the largest organ of your body. Burns damage this natural protection. To help prevent infection, it is very important to follow your caregiver's instructions in the care of your burn. Burns are classified as:  First degree. There is only redness of the skin (erythema). No scarring is expected.  Second degree. There is blistering of the skin. Scarring may occur with deeper burns.  Third degree. All layers of the skin are injured, and scarring is expected. HOME CARE INSTRUCTIONS   Wash your hands well before changing your bandage.  Change your bandage as often as directed by your caregiver.  Remove the old bandage. If the bandage sticks, you may soak it off with cool, clean water.  Cleanse the burn thoroughly but gently with mild soap and water.  Pat the area dry with a clean, dry cloth.  Apply a thin layer of antibacterial cream to the burn.  Apply a clean bandage as instructed by your caregiver.  Keep the bandage as clean and dry as possible.  Elevate the affected area for the first 24 hours, then as instructed by your caregiver.  Only take over-the-counter or prescription medicines for pain, discomfort, or fever as directed by your caregiver. SEEK IMMEDIATE MEDICAL CARE IF:   You develop excessive pain.  You develop redness, tenderness, swelling, or red streaks near the burn.  The burned area develops yellowish-white fluid (pus) or a bad smell.  You have a fever. MAKE SURE YOU:   Understand these instructions.  Will watch your condition.  Will get help right away if you are not doing well or get worse. Document Released: 03/16/2005 Document Revised: 06/08/2011 Document Reviewed: 08/06/2010 [facility] Patient  Information 2015 [place], [place]. This information is not intended to replace advice given to you by your health care provider. Make sure you discuss any questions you have with your health care provider.

## 2014-04-14 ENCOUNTER — Emergency Department (HOSPITAL_COMMUNITY)
Admission: EM | Admit: 2014-04-14 | Discharge: 2014-04-14 | Disposition: A | Discharge status: Home / Self Care | Payer: BLUE CROSS/BLUE SHIELD | Source: Home / Self Care

## 2014-04-14 ENCOUNTER — Encounter (HOSPITAL_COMMUNITY): Payer: Self-pay | Admitting: Emergency Medicine

## 2014-04-14 DIAGNOSIS — T300 Burn of unspecified body region, unspecified degree: Secondary | ICD-10-CM

## 2014-04-14 MED ORDER — SILVER SULFADIAZINE 1 % EX CREA: 1.0000 "application " | TOPICAL_CREAM | Freq: Every day | CUTANEOUS | Status: DC

## 2014-04-14 MED ORDER — SILVER SULFADIAZINE 1 % EX CREA
TOPICAL_CREAM | CUTANEOUS | Status: AC
  Filled 2014-04-14: qty 85

## 2014-04-14 MED ORDER — DOXYCYCLINE HYCLATE 100 MG PO TABS: 100.0000 mg | ORAL_TABLET | Freq: Two times a day (BID) | ORAL | Status: DC

## 2014-04-14 MED ORDER — TRIAMCINOLONE ACETONIDE 0.1 % EX CREA: 1.0000 "application " | TOPICAL_CREAM | Freq: Three times a day (TID) | CUTANEOUS | Status: DC
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
## 2014-04-14 NOTE — Discharge Instructions (Signed)
Burn Care Your skin is a natural barrier to infection. It is the largest organ of your body. Burns damage this natural protection. To help prevent infection, it is very important to follow your caregiver's instructions in the care of your burn. Burns are classified as:  First degree. There is only redness of the skin (erythema). No scarring is expected.  Second degree. There is blistering of the skin. Scarring may occur with deeper burns.  Third degree. All layers of the skin are injured, and scarring is expected. HOME CARE INSTRUCTIONS   Wash your hands well before changing your bandage.  Change your bandage as often as directed by your caregiver.  Remove the old bandage. If the bandage sticks, you may soak it off with cool, clean water.  Cleanse the burn thoroughly but gently with mild soap and water.  Pat the area dry with a clean, dry cloth.  Apply a thin layer of antibacterial cream to the burn.  Apply a clean bandage as instructed by your caregiver.  Keep the bandage as clean and dry as possible.  Elevate the affected area for the first 24 hours, then as instructed by your caregiver.  Only take over-the-counter or prescription medicines for pain, discomfort, or fever as directed by your caregiver. SEEK IMMEDIATE MEDICAL CARE IF:   You develop excessive pain.  You develop redness, tenderness, swelling, or red streaks near the burn.  The burned area develops yellowish-white fluid (pus) or a bad smell.  You have a fever. MAKE SURE YOU:   Understand these instructions.  Will watch your condition.  Will get help right away if you are not doing well or get worse. Document Released: 03/16/2005 Document Revised: 06/08/2011 Document Reviewed: 08/06/2010 [employer] Patient Information 2015 [employer]. This information is not intended to replace advice given to you by your health care provider. Make sure you discuss any questions you have with your health care  provider.  
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
## 2014-05-17 ENCOUNTER — Ambulatory Visit: Payer: BC Managed Care – PPO | Admitting: Adult Health

## 2015-01-26 ENCOUNTER — Encounter (HOSPITAL_COMMUNITY): Payer: Self-pay | Admitting: *Deleted

## 2015-01-26 ENCOUNTER — Emergency Department (HOSPITAL_COMMUNITY)
Admission: EM | Admit: 2015-01-26 | Discharge: 2015-01-26 | Disposition: A | Discharge status: Home / Self Care | Payer: BLUE CROSS/BLUE SHIELD | Source: Home / Self Care | Attending: Family Medicine | Admitting: Family Medicine

## 2015-01-26 DIAGNOSIS — S20221A Contusion of right back wall of thorax, initial encounter: Secondary | ICD-10-CM | POA: Diagnosis not present

## 2015-01-26 MED ORDER — METAXALONE 800 MG PO TABS: 800.0000 mg | ORAL_TABLET | Freq: Three times a day (TID) | ORAL | Status: DC
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
## 2015-01-26 NOTE — Discharge Instructions (Signed)
Heat and medicine as needed, see your doctor if further problems. °

## 2015-01-26 NOTE — ED Provider Notes (Signed)
CSN: [id number]     Arrival date & time 01/26/15  1417 History   None    Chief Complaint  Patient presents with  . Fall   (Consider location/radiation/quality/duration/timing/severity/associated sxs/prior Treatment) Patient is a 34 y.o. female presenting with fall. The history is provided by the patient.  Fall This is a new problem. The current episode started 2 days ago (fell in [employer] on back on thurs, still sore, no gi or gu or neuro sx.). The problem has not changed since onset.Pertinent negatives include no chest pain, no abdominal pain, no headaches and no shortness of breath.    Past Medical History  Diagnosis Date  . Bulging lumbar disc   . Anxiety   . Abnormality of gait 08/02/2012  . Chronic arthralgias of knees and hips 08/02/2012  . Obesity   . Depression     Pt. took self off Celexa one month ago, states she did n't like how it made her feel .  Marland Kitchen Asthma   . Heart murmur     pt. been told that this is a fact, no echo or cardiac surveillance in the past   . GERD (gastroesophageal reflux disease)   . Arthritis     spine- cerv.  & arm   . Anemia     iron taking in the past, not currently   Past Surgical History  Procedure Laterality Date  . Vaginal delivery  2002  . Chalazion excision      L eye   . Anterior cervical decomp/discectomy fusion N/A 10/27/2012    Procedure: ANTERIOR CERVICAL DECOMPRESSION/DISCECTOMY FUSION 1 LEVEL;  Surgeon: Faythe Ghee, MD;  Location: [facility];  Service: Neurosurgery;  Laterality: N/A;  ANTERIOR CERVICAL DECOMPRESSION/DISCECTOMY FUSION 1 LEVEL   Family History  Problem Relation Age of Onset  . Osteoarthritis Mother   . Hypertension Mother   . Hypertension Father   . Gout Father   . Stroke Father    Social History  Substance Use Topics  . Smoking status: Current Every Day Smoker -- 1.00 packs/day for 10 years    Types: Cigarettes  . Smokeless tobacco: Never Used  . Alcohol Use: Yes     Comment: occas   OB History     No data available     Review of Systems  Constitutional: Negative.   Respiratory: Negative for shortness of breath.   Cardiovascular: Negative for chest pain.  Gastrointestinal: Negative.  Negative for abdominal pain.  Genitourinary: Negative.   Musculoskeletal: Positive for back pain. Negative for joint swelling, arthralgias and gait problem.  Skin: Negative.   Neurological: Negative for light-headedness and headaches.  All other systems reviewed and are negative.   Allergies  Aspirin  Home Medications   Prior to Admission medications   Medication Sig Start Date End Date Taking? Authorizing Provider  acetaminophen (TYLENOL) 500 MG tablet Take 500-1,000 mg by mouth daily as needed for pain.     Historical Provider, MD  ALPRAZolam Duanne Moron) 0.5 MG tablet Take 0.5 mg by mouth daily. 11/13/13   Historical Provider, MD  doxycycline (VIBRA-TABS) 100 MG tablet Take 1 tablet (100 mg total) by mouth 2 (two) times daily. 04/14/14   Harden Mo, MD  gabapentin (NEURONTIN) 300 MG capsule 1 capsule in the morning, at midday, and 2 capsules at night 09/25/13   Kathrynn Ducking, MD  metaxalone (SKELAXIN) 800 MG tablet Take 1 tablet (800 mg total) by mouth 3 (three) times daily. For back pain 01/26/15  Billy Fischer, MD  methocarbamol (ROBAXIN) 500 MG tablet Take 1 tablet (500 mg total) by mouth 3 (three) times daily. 11/15/13   Kathrynn Ducking, MD  oxyCODONE-acetaminophen (PERCOCET/ROXICET) 5-325 MG per tablet Take 1 tablet by mouth every 8 (eight) hours as needed for moderate pain or severe pain. 11/13/13   Marissa Sciacca, PA-C  predniSONE (DELTASONE) 5 MG tablet Begin taking 6 tablets daily, taper by one tablet daily until off the medication. 11/15/13   Kathrynn Ducking, MD  silver sulfADIAZINE (SILVADENE) 1 % cream Apply topically daily. To forearm 04/04/14   Gregor Hams, MD  silver sulfADIAZINE (SILVADENE) 1 % cream Apply 1 application topically daily. 04/14/14   Harden Mo, MD  traMADol  (ULTRAM) 50 MG tablet Take 1 tablet (50 mg total) by mouth every 6 (six) hours as needed for moderate pain. 11/14/13   Ward Givens, NP  triamcinolone cream (KENALOG) 0.1 % Apply 1 application topically 3 (three) times daily. 04/14/14   Harden Mo, MD  vitamin B-12 (CYANOCOBALAMIN) 1000 MCG tablet Take 1,000 mcg by mouth daily.    Historical Provider, MD   Meds Ordered and Administered this Visit  Medications - No data to display  BP 163/97 mmHg  Pulse 62  Temp(Src) 98.2 F (36.8 C) (Oral)  Resp 18  SpO2 100%  LMP 01/13/2015 No data found.   Physical Exam  Constitutional: She is oriented to person, place, and time. She appears well-developed and well-nourished. No distress.  Ambulatory, getting up and down from chair with out diff.neg slr.  HENT:  Head: Normocephalic and atraumatic.  Musculoskeletal: She exhibits tenderness.       Lumbar back: She exhibits tenderness. She exhibits normal range of motion, no bony tenderness, no swelling, no edema, no deformity, no spasm and normal pulse.       Back:  Neurological: She is alert and oriented to person, place, and time.  Skin: Skin is warm and dry.  Nursing note and vitals reviewed.   ED Course  Procedures (including critical care time)  Labs Review Labs Reviewed - No data to display  Imaging Review No results found.   Visual Acuity Review  Right Eye Distance:   Left Eye Distance:   Bilateral Distance:    Right Eye Near:   Left Eye Near:    Bilateral Near:         MDM   1. Back contusion, right, initial encounter        Billy Fischer, MD 01/26/15 [phone]

## 2015-01-29 ENCOUNTER — Encounter (HOSPITAL_COMMUNITY): Payer: Self-pay | Admitting: *Deleted

## 2015-01-29 ENCOUNTER — Emergency Department (HOSPITAL_COMMUNITY)
Admission: EM | Admit: 2015-01-29 | Discharge: 2015-01-29 | Disposition: A | Discharge status: Home / Self Care | Payer: BLUE CROSS/BLUE SHIELD | Attending: Emergency Medicine | Admitting: Emergency Medicine

## 2015-01-29 ENCOUNTER — Emergency Department (HOSPITAL_COMMUNITY): Payer: BLUE CROSS/BLUE SHIELD

## 2015-01-29 DIAGNOSIS — R011 Cardiac murmur, unspecified: Secondary | ICD-10-CM | POA: Diagnosis not present

## 2015-01-29 DIAGNOSIS — J45909 Unspecified asthma, uncomplicated: Secondary | ICD-10-CM | POA: Insufficient documentation

## 2015-01-29 DIAGNOSIS — Z8719 Personal history of other diseases of the digestive system: Secondary | ICD-10-CM | POA: Insufficient documentation

## 2015-01-29 DIAGNOSIS — Z79899 Other long term (current) drug therapy: Secondary | ICD-10-CM | POA: Diagnosis not present

## 2015-01-29 DIAGNOSIS — F17210 Nicotine dependence, cigarettes, uncomplicated: Secondary | ICD-10-CM | POA: Diagnosis not present

## 2015-01-29 DIAGNOSIS — Z862 Personal history of diseases of the blood and blood-forming organs and certain disorders involving the immune mechanism: Secondary | ICD-10-CM | POA: Diagnosis not present

## 2015-01-29 DIAGNOSIS — F419 Anxiety disorder, unspecified: Secondary | ICD-10-CM | POA: Diagnosis not present

## 2015-01-29 DIAGNOSIS — Z3202 Encounter for pregnancy test, result negative: Secondary | ICD-10-CM | POA: Insufficient documentation

## 2015-01-29 DIAGNOSIS — Y9389 Activity, other specified: Secondary | ICD-10-CM | POA: Diagnosis not present

## 2015-01-29 DIAGNOSIS — M545 Low back pain, unspecified: Secondary | ICD-10-CM

## 2015-01-29 DIAGNOSIS — E669 Obesity, unspecified: Secondary | ICD-10-CM | POA: Insufficient documentation

## 2015-01-29 DIAGNOSIS — Z8739 Personal history of other diseases of the musculoskeletal system and connective tissue: Secondary | ICD-10-CM | POA: Insufficient documentation

## 2015-01-29 DIAGNOSIS — Y998 Other external cause status: Secondary | ICD-10-CM | POA: Diagnosis not present

## 2015-01-29 DIAGNOSIS — Y92512 Supermarket, store or market as the place of occurrence of the external cause: Secondary | ICD-10-CM | POA: Insufficient documentation

## 2015-01-29 DIAGNOSIS — S3992XA Unspecified injury of lower back, initial encounter: Secondary | ICD-10-CM | POA: Insufficient documentation

## 2015-01-29 DIAGNOSIS — W1839XA Other fall on same level, initial encounter: Secondary | ICD-10-CM | POA: Diagnosis not present

## 2015-01-29 LAB — POC URINE PREG, ED: Preg Test, Ur: NEGATIVE

## 2015-01-29 MED ORDER — TRAMADOL HCL 50 MG PO TABS: 50.0000 mg | ORAL_TABLET | Freq: Four times a day (QID) | ORAL | Status: DC | PRN

## 2015-01-29 NOTE — ED Provider Notes (Signed)
CSN: [id number]     Arrival date & time 01/29/15  8546 History   First MD Initiated Contact with Patient 01/29/15 [phone]     CC: Back pain   HPI Comments: Patient fell in a store on Thursday. She has had persistent pain since that injury. The pain has stayed in the middle of her lower back. She went to an urgent care on October 29. She was evaluated and given a prescription for muscle relaxants. Patient states that has not helped and she continues to have pain in her lower back. She has also been taking Tylenol. She denies any abdominal pain, numbness or weakness.  Patient is a 34 y.o. female presenting with back pain. The history is provided by the patient.  Back Pain Location:  Lumbar spine Quality:  Aching Radiates to:  Does not radiate Pain severity:  Moderate Onset quality:  Sudden (Pain started after she fell in a store on Thursday.) Timing:  Constant Context: falling   Relieved by:  Nothing Worsened by:  Twisting Associated symptoms: no abdominal pain, no bladder incontinence, no bowel incontinence, no dysuria, no fever, no leg pain, no numbness, no perianal numbness and no weakness     Past Medical History  Diagnosis Date  . Bulging lumbar disc   . Anxiety   . Abnormality of gait 08/02/2012  . Chronic arthralgias of knees and hips 08/02/2012  . Obesity   . Depression     Pt. took self off Celexa one month ago, states she did n't like how it made her feel .  Marland Kitchen Asthma   . Heart murmur     pt. been told that this is a fact, no echo or cardiac surveillance in the past   . GERD (gastroesophageal reflux disease)   . Arthritis     spine- cerv.  & arm   . Anemia     iron taking in the past, not currently   Past Surgical History  Procedure Laterality Date  . Vaginal delivery  2002  . Chalazion excision      L eye   . Anterior cervical decomp/discectomy fusion N/A 10/27/2012    Procedure: ANTERIOR CERVICAL DECOMPRESSION/DISCECTOMY FUSION 1 LEVEL;  Surgeon: Faythe Ghee, MD;   Location: [facility];  Service: Neurosurgery;  Laterality: N/A;  ANTERIOR CERVICAL DECOMPRESSION/DISCECTOMY FUSION 1 LEVEL   Family History  Problem Relation Age of Onset  . Osteoarthritis Mother   . Hypertension Mother   . Hypertension Father   . Gout Father   . Stroke Father    Social History  Substance Use Topics  . Smoking status: Current Every Day Smoker -- 1.00 packs/day for 10 years    Types: Cigarettes  . Smokeless tobacco: Never Used  . Alcohol Use: Yes     Comment: occas   OB History    No data available     Review of Systems  Constitutional: Negative for fever.  Gastrointestinal: Negative for abdominal pain and bowel incontinence.  Genitourinary: Negative for bladder incontinence and dysuria.  Musculoskeletal: Positive for back pain.  Neurological: Negative for weakness and numbness.  All other systems reviewed and are negative.     Allergies  Aspirin  Home Medications   Prior to Admission medications   Medication Sig Start Date End Date Taking? Authorizing Provider  acetaminophen (TYLENOL) 500 MG tablet Take 500-1,000 mg by mouth daily as needed for pain.    Yes Historical Provider, MD  metaxalone (SKELAXIN) 800 MG tablet Take 1 tablet (800  mg total) by mouth 3 (three) times daily. For back pain 01/26/15  Yes Billy Fischer, MD  Prenatal Vit-Fe Fumarate-FA (PRENATAL MULTIVITAMIN) TABS tablet Take 1 tablet by mouth daily at 12 noon.   Yes Historical Provider, MD  gabapentin (NEURONTIN) 300 MG capsule 1 capsule in the morning, at midday, and 2 capsules at night Patient not taking: Reported on 01/29/2015 09/25/13   Kathrynn Ducking, MD  traMADol (ULTRAM) 50 MG tablet Take 1 tablet (50 mg total) by mouth every 6 (six) hours as needed. 01/29/15   Dorie Rank, MD   BP 152/90 mmHg  Pulse 74  Temp(Src) 98.1 F (36.7 C) (Oral)  Resp 16  SpO2 100%  LMP 01/13/2015 Physical Exam  Constitutional: She appears well-developed and well-nourished. No distress.  HENT:   Head: Normocephalic and atraumatic.  Right Ear: External ear normal.  Left Ear: External ear normal.  Eyes: Conjunctivae are normal. Right eye exhibits no discharge. Left eye exhibits no discharge. No scleral icterus.  Neck: Neck supple. No tracheal deviation present.  Cardiovascular: Normal rate, regular rhythm and normal heart sounds.   Pulmonary/Chest: Effort normal and breath sounds normal. No stridor. No respiratory distress. She has no wheezes.  Abdominal: Soft. There is no tenderness.  Musculoskeletal: She exhibits no edema.  Neurological: She is alert. Cranial nerve deficit: no gross deficits.  5 out of 5 strength bilateral lower extremities, normal sensation, patient is able to ambulate without difficulty  Skin: Skin is warm and dry. No rash noted.  Psychiatric: She has a normal mood and affect.  Nursing note and vitals reviewed.   ED Course  Procedures (including critical care time) Labs Review Labs Reviewed  POC URINE PREG, ED    Imaging Review Dg Lumbar Spine Complete  01/29/2015  CLINICAL DATA:  Status post fall, persistent low back pain. EXAM: LUMBAR SPINE - COMPLETE 4+ VIEW COMPARISON:  Lumbar spine series of November 13, 2013 FINDINGS: The lumbar vertebral bodies are preserved in height. The L1 transverse processes are transitional. The disc space heights are well maintained. There is small anterior endplate osteophytes at L3-4. There is no spondylolisthesis. The pedicles and transverse processes are intact. The spinous processes are intact. IMPRESSION: There is mild degenerative disc disease manifested by endplate spurring at Z1-6 and L5-S1. There is no acute compression fracture nor other acute bony abnormality. Electronically Signed   By: David  Martinique M.D.   On: 01/29/2015 09:12   I have personally reviewed and evaluated these image results as part of my medical decision-making.    MDM   Final diagnoses:  Midline low back pain without sciatica    No sign of  acute neurological or vascular emergency associated with pt's back pain.  No sx to suggest a  component of sciatica.  Safe for outpatient follow up.  Dc with rx for pain meds. PCP pr chiropractor follow up     Dorie Rank, MD 01/29/15 [phone]

## 2015-01-29 NOTE — ED Notes (Signed)
Pt reports she fell in a store on Thursday 10/27, went to Urgent care on 10/29 for low back pain, states the muscle relaxer prescribed is not helping with the pain. Pain 8/10. Denies incontinence or numbness/tingling in legs. Ambulatory.

## 2015-01-29 NOTE — Discharge Instructions (Signed)

## 2015-05-29 ENCOUNTER — Ambulatory Visit (INDEPENDENT_AMBULATORY_CARE_PROVIDER_SITE_OTHER): Payer: BLUE CROSS/BLUE SHIELD | Admitting: Family Medicine

## 2015-05-29 VITALS — BP 127/90 | HR 86 | Temp 99.8°F | Resp 20 | Ht 66.0 in | Wt 247.4 lb

## 2015-05-29 DIAGNOSIS — R590 Localized enlarged lymph nodes: Secondary | ICD-10-CM

## 2015-05-29 DIAGNOSIS — R21 Rash and other nonspecific skin eruption: Secondary | ICD-10-CM

## 2015-05-29 DIAGNOSIS — K5901 Slow transit constipation: Secondary | ICD-10-CM

## 2015-05-29 DIAGNOSIS — B353 Tinea pedis: Secondary | ICD-10-CM | POA: Diagnosis not present

## 2015-05-29 DIAGNOSIS — Z131 Encounter for screening for diabetes mellitus: Secondary | ICD-10-CM | POA: Diagnosis not present

## 2015-05-29 DIAGNOSIS — R300 Dysuria: Secondary | ICD-10-CM

## 2015-05-29 LAB — POCT CBC
GRANULOCYTE PERCENT: 59 % (ref 37–80)
HCT, POC: 38.6 % (ref 37.7–47.9)
Hemoglobin: 13.7 g/dL (ref 12.2–16.2)
Lymph, poc: 3 (ref 0.6–3.4)
MCH: 33.1 pg — AB (ref 27–31.2)
MCHC: 35.4 g/dL (ref 31.8–35.4)
MCV: 93.4 fL (ref 80–97)
MID (cbc): 0.2 (ref 0–0.9)
MPV: 7.7 fL (ref 0–99.8)
POC GRANULOCYTE: 4.6 (ref 2–6.9)
POC LYMPH %: 38.1 % (ref 10–50)
POC MID %: 2.9 %M (ref 0–12)
Platelet Count, POC: 294 10*3/uL (ref 142–424)
RBC: 4.13 M/uL (ref 4.04–5.48)
RDW, POC: 13.2 %
WBC: 7.8 10*3/uL (ref 4.6–10.2)

## 2015-05-29 LAB — POCT URINALYSIS DIP (MANUAL ENTRY)
BILIRUBIN UA: NEGATIVE
Blood, UA: NEGATIVE
GLUCOSE UA: NEGATIVE
Ketones, POC UA: NEGATIVE
LEUKOCYTES UA: NEGATIVE
Nitrite, UA: NEGATIVE
PH UA: 6
Protein Ur, POC: NEGATIVE
Spec Grav, UA: 1.015
Urobilinogen, UA: 0.2

## 2015-05-29 LAB — POC MICROSCOPIC URINALYSIS (UMFC): Mucus: ABSENT

## 2015-05-29 LAB — GLUCOSE, POCT (MANUAL RESULT ENTRY): POC GLUCOSE: 73 mg/dL (ref 70–99)

## 2015-05-29 LAB — POCT RAPID STREP A (OFFICE): RAPID STREP A SCREEN: NEGATIVE

## 2015-05-29 LAB — POCT SKIN KOH: Skin KOH, POC: POSITIVE

## 2015-05-29 LAB — POCT GLYCOSYLATED HEMOGLOBIN (HGB A1C): Hemoglobin A1C: 5.8

## 2015-05-29 MED ORDER — KETOCONAZOLE 2 % EX CREA: 1.0000 "application " | TOPICAL_CREAM | Freq: Every day | CUTANEOUS | Status: DC

## 2015-05-29 NOTE — Progress Notes (Signed)
Subjective:    Patient ID: Heidi Costa, female    DOB: [birth year], 35 y.o.   MRN: [id number]  05/29/2015  Adenopathy; Urinary Tract Infection; and Rash   HPI This 35 y.o. female presents for evaluation of multiple concerns:   Urinary frequency: onset one week sago. NO fever/chills/sweats.  No hematuria; no dysuria; urgency; incontinence.  No n/v.  Chronic back pain; L sided pain.  No kidney stones.  NO prevention of pregnancy; married.  No vaginal discharge; no vaginal irritation.  LMP 05-14-15.  Nocturia x 2; usually sleeps all night.  Decreased b.m.s; not going as regularly; usually goes every 2 days.  Rash: onset one month ago; history of impetigo.  No daignsois of eczema.  Having dr spots of skin. Started out as a red rash.  Uses different detergent frequently.  L medial thickness. Using rx hydrocortisone and vaseline bid for one month intermittent daily.  Itchy and irritated.  Does hurt at times.  Cervical LAD: anterior two days ago.   No fever/chills/sweats.  No headache; no sore trhoat; no rhinorrhea; no ear pain; no nasal congestion; no cough.    Review of Systems Per HPI   Past Medical History  Diagnosis Date  . Bulging lumbar disc   . Anxiety   . Abnormality of gait 08/02/2012  . Chronic arthralgias of knees and hips 08/02/2012  . Obesity   . Depression     Pt. took self off Celexa one month ago, states she did n't like how it made her feel .  Marland Kitchen Asthma   . Heart murmur     pt. been told that this is a fact, no echo or cardiac surveillance in the past   . GERD (gastroesophageal reflux disease)   . Arthritis     spine- cerv.  & arm   . Anemia     iron taking in the past, not currently   Past Surgical History  Procedure Laterality Date  . Vaginal delivery  2002  . Chalazion excision      L eye   . Anterior cervical decomp/discectomy fusion N/A 10/27/2012    Procedure: ANTERIOR CERVICAL DECOMPRESSION/DISCECTOMY FUSION 1 LEVEL;  Surgeon: Faythe Ghee, MD;   Location: [facility];  Service: Neurosurgery;  Laterality: N/A;  ANTERIOR CERVICAL DECOMPRESSION/DISCECTOMY FUSION 1 LEVEL   Allergies  Allergen Reactions  . Aspirin Rash   Current Outpatient Prescriptions  Medication Sig Dispense Refill  . acetaminophen (TYLENOL) 500 MG tablet Take 500-1,000 mg by mouth daily as needed for pain.     Marland Kitchen gabapentin (NEURONTIN) 300 MG capsule 1 capsule in the morning, at midday, and 2 capsules at night (Patient not taking: Reported on 01/29/2015) 120 capsule 5  . ketoconazole (NIZORAL) 2 % cream Apply 1 application topically daily. 60 g 0   No current facility-administered medications for this visit.   Social History   Social History  . Marital Status: Married    Spouse Name: N/A  . Number of Children: 1  . Years of Education: associates   Occupational History  .  Other   Social History Main Topics  . Smoking status: Current Every Day Smoker -- 1.00 packs/day for 10 years    Types: Cigarettes  . Smokeless tobacco: Never Used  . Alcohol Use: Yes     Comment: occas  . Drug Use: Yes    Special: Marijuana     Comment: occ  . Sexual Activity: Yes    Birth Control/ Protection: None  Other Topics Concern  . Not on file   Social History Narrative   Patient is married with one child.   Patient is right handed.   Patient has a Associate's degree.   Patient drinks 2-3 cups daily.   Family History  Problem Relation Age of Onset  . Osteoarthritis Mother   . Hypertension Mother   . Hypertension Father   . Gout Father   . Stroke Father        Objective:    BP 127/90 mmHg  Pulse 86  Temp(Src) 99.8 F (37.7 C) (Oral)  Resp 20  Ht 5\' 6"  (1.676 m)  Wt 247 lb 6.4 oz (112.22 kg)  BMI 39.95 kg/m2  SpO2 98%  LMP 05/22/2015 Physical Exam  Constitutional: She is oriented to person, place, and time. She appears well-developed and well-nourished. No distress.  HENT:  Head: Normocephalic and atraumatic.  Right Ear: External ear normal.    Left Ear: External ear normal.  Nose: Nose normal.  Mouth/Throat: Oropharynx is clear and moist.  Eyes: Conjunctivae and EOM are normal. Pupils are equal, round, and reactive to light.  Neck: Normal range of motion. Neck supple. Carotid bruit is not present. No thyromegaly present.  Mildly prominent cervical nodes anterior; non-tender; mobile.  Cardiovascular: Normal rate, regular rhythm, normal heart sounds and intact distal pulses.  Exam reveals no gallop and no friction rub.   No murmur heard. Pulmonary/Chest: Effort normal and breath sounds normal. She has no wheezes. She has no rales.  Abdominal: Soft. Bowel sounds are normal. She exhibits no distension and no mass. There is no tenderness. There is no rebound and no guarding.  Lymphadenopathy:    She has no cervical adenopathy.  Neurological: She is alert and oriented to person, place, and time. No cranial nerve deficit.  Skin: Skin is warm and dry. Rash noted. She is not diaphoretic. No erythema. No pallor.  L ankle with hypertrophied scaling hyperpigmented rash.  Psychiatric: She has a normal mood and affect. Her behavior is normal.   Results for orders placed or performed in visit on 05/29/15  Urine culture  Result Value Ref Range   Colony Count 25,000 COLONIES/ML    Organism ID, Bacteria Multiple bacterial morphotypes present, none    Organism ID, Bacteria predominant. Suggest appropriate recollection if     Organism ID, Bacteria clinically indicated.   Culture, Group A Strep  Result Value Ref Range   Organism ID, Bacteria Normal Upper Respiratory Flora    Organism ID, Bacteria No Beta Hemolytic Streptococci Isolated   POCT Microscopic Urinalysis ([facility])  Result Value Ref Range   WBC,UR,HPF,POC None None WBC/hpf   RBC,UR,HPF,POC None None RBC/hpf   Bacteria None None, Too numerous to count   Mucus Absent Absent   Epithelial Cells, UR Per Microscopy Moderate (A) None, Too numerous to count cells/hpf  POCT urinalysis  dipstick  Result Value Ref Range   Color, UA yellow yellow   Clarity, UA clear clear   Glucose, UA negative negative   Bilirubin, UA negative negative   Ketones, POC UA negative negative   Spec Grav, UA 1.015    Blood, UA negative negative   pH, UA 6.0    Protein Ur, POC negative negative   Urobilinogen, UA 0.2    Nitrite, UA Negative Negative   Leukocytes, UA Negative Negative  POCT CBC  Result Value Ref Range   WBC 7.8 4.6 - 10.2 K/uL   Lymph, poc 3.0 0.6 - 3.4   POC LYMPH  PERCENT 38.1 10 - 50 %L   MID (cbc) 0.2 0 - 0.9   POC MID % 2.9 0 - 12 %M   POC Granulocyte 4.6 2 - 6.9   Granulocyte percent 59.0 37 - 80 %G   RBC 4.13 4.04 - 5.48 M/uL   Hemoglobin 13.7 12.2 - 16.2 g/dL   HCT, POC 38.6 37.7 - 47.9 %   MCV 93.4 80 - 97 fL   MCH, POC 33.1 (A) 27 - 31.2 pg   MCHC 35.4 31.8 - 35.4 g/dL   RDW, POC 13.2 %   Platelet Count, POC 294 142 - 424 K/uL   MPV 7.7 0 - 99.8 fL  POCT rapid strep A  Result Value Ref Range   Rapid Strep A Screen Negative Negative  POCT Skin KOH  Result Value Ref Range   Skin KOH, POC Positive   POCT glucose (manual entry)  Result Value Ref Range   POC Glucose 73 70 - 99 mg/dl  POCT glycosylated hemoglobin (Hb A1C)  Result Value Ref Range   Hemoglobin A1C 5.8        Assessment & Plan:   1. Dysuria   2. Rash   3. Cervical lymphadenopathy   4. Screening for diabetes mellitus   5. Slow transit constipation   6. Tinea pedis of left foot     Orders Placed This Encounter  Procedures  . Urine culture  . Culture, Group A Strep    Order Specific Question:  Source    Answer:  throat  . POCT Microscopic Urinalysis ([facility])  . POCT urinalysis dipstick  . POCT CBC  . POCT rapid strep A  . POCT Skin KOH  . POCT glucose (manual entry)  . POCT glycosylated hemoglobin (Hb A1C)   Meds ordered this encounter  Medications  . ketoconazole (NIZORAL) 2 % cream    Sig: Apply 1 application topically daily.    Dispense:  60 g    Refill:  0    No  Follow-up on file.    Franki Stemen Elayne Guerin, M.D. [facility] [address] [place], [place]  [address] [phone] phone [phone] fax

## 2015-05-29 NOTE — Patient Instructions (Signed)
1. Call in 2 weeks if rash not improved. 2. Treat constipation with increased water, fiber/fruits/vegetables.  Constipation can lead to urinary frequency. 3.  Return if lymph nodes enlarge further.

## 2015-05-31 LAB — CULTURE, GROUP A STREP: Organism ID, Bacteria: NORMAL

## 2015-05-31 LAB — URINE CULTURE: Colony Count: 25000

## 2015-07-11 ENCOUNTER — Ambulatory Visit (INDEPENDENT_AMBULATORY_CARE_PROVIDER_SITE_OTHER): Payer: BLUE CROSS/BLUE SHIELD | Admitting: Physician Assistant

## 2015-07-11 ENCOUNTER — Ambulatory Visit: Payer: BLUE CROSS/BLUE SHIELD

## 2015-07-11 VITALS — BP 142/100 | HR 91 | Temp 98.5°F | Resp 18 | Ht 65.5 in | Wt 244.5 lb

## 2015-07-11 DIAGNOSIS — R05 Cough: Secondary | ICD-10-CM

## 2015-07-11 DIAGNOSIS — I10 Essential (primary) hypertension: Secondary | ICD-10-CM | POA: Diagnosis not present

## 2015-07-11 DIAGNOSIS — R21 Rash and other nonspecific skin eruption: Secondary | ICD-10-CM | POA: Diagnosis not present

## 2015-07-11 DIAGNOSIS — R0600 Dyspnea, unspecified: Secondary | ICD-10-CM

## 2015-07-11 DIAGNOSIS — J209 Acute bronchitis, unspecified: Secondary | ICD-10-CM | POA: Diagnosis not present

## 2015-07-11 DIAGNOSIS — R059 Cough, unspecified: Secondary | ICD-10-CM

## 2015-07-11 LAB — POCT CBC
GRANULOCYTE PERCENT: 70.6 % (ref 37–80)
HCT, POC: 37.6 % — AB (ref 37.7–47.9)
Hemoglobin: 13.2 g/dL (ref 12.2–16.2)
Lymph, poc: 1.9 (ref 0.6–3.4)
MCH: 32.7 pg — AB (ref 27–31.2)
MCHC: 35.1 g/dL (ref 31.8–35.4)
MCV: 93.1 fL (ref 80–97)
MID (cbc): 0.4 (ref 0–0.9)
MPV: 7.2 fL (ref 0–99.8)
PLATELET COUNT, POC: 297 10*3/uL (ref 142–424)
POC Granulocyte: 5.6 (ref 2–6.9)
POC LYMPH PERCENT: 24 %L (ref 10–50)
POC MID %: 5.4 %M (ref 0–12)
RBC: 4.04 M/uL (ref 4.04–5.48)
RDW, POC: 13.8 %
WBC: 7.9 10*3/uL (ref 4.6–10.2)

## 2015-07-11 MED ORDER — AMLODIPINE BESYLATE 5 MG PO TABS: 5.0000 mg | ORAL_TABLET | Freq: Every day | ORAL | Status: DC

## 2015-07-11 MED ORDER — DULOXETINE HCL 30 MG PO CPEP: 30.0000 mg | ORAL_CAPSULE | Freq: Two times a day (BID) | ORAL | Status: DC

## 2015-07-11 MED ORDER — HYDROCOD POLST-CPM POLST ER 10-8 MG/5ML PO SUER: 5.0000 mL | Freq: Every evening | ORAL | Status: DC | PRN

## 2015-07-11 MED ORDER — GUAIFENESIN ER 1200 MG PO TB12: 1.0000 | ORAL_TABLET | Freq: Two times a day (BID) | ORAL | Status: DC | PRN

## 2015-07-11 NOTE — Patient Instructions (Addendum)
IF you received an x-ray today, you will receive an invoice from [facility]. Please contact [facility] at [phone] with questions or concerns regarding your invoice.   IF you received labwork today, you will receive an invoice from [employer]. Please contact [facility] at [phone] with questions or concerns regarding your invoice.   Our billing staff will not be able to assist you with questions regarding bills from these companies.  You will be contacted with the lab results as soon as they are available. The fastest way to get your results is to activate your My Chart account. Instructions are located on the last page of this paperwork. If you have not heard from [place] regarding the results in 2 weeks, please contact this office.    I ordered the pathology for your ankle.  Please await contact for this.  We will not treat at this time. We are going to start the cymbalta today.  Please start with 1 tablet daily for 1 week, then increase to 2 tablets daily.  You will follow up in 4 weeks about this. We are starting anti-hypertensive today as well.  Check your blood pressure twice per week.  If it is over 140/90, you will return within 2 weeks.  If not, we will recheck you in 4 weeks with the anti-depressant use. This cough appears to be a virus exacerbating the asthma.  Take the mucinex.  You must hydrate well with 64 oz of water per day.  Please use the albuterol if needed.  Acute Bronchitis Bronchitis is inflammation of the airways that extend from the windpipe into the lungs (bronchi). The inflammation often causes mucus to develop. This leads to a cough, which is the most common symptom of bronchitis.  In acute bronchitis, the condition usually develops suddenly and goes away over time, usually in a couple weeks. Smoking, allergies, and asthma can make bronchitis worse. Repeated episodes of bronchitis may cause further lung problems.   CAUSES Acute bronchitis is most often caused by the same virus that causes a cold. The virus can spread from person to person (contagious) through coughing, sneezing, and touching contaminated objects. SIGNS AND SYMPTOMS   Cough.   Fever.   Coughing up mucus.   Body aches.   Chest congestion.   Chills.   Shortness of breath.   Sore throat.  DIAGNOSIS  Acute bronchitis is usually diagnosed through a physical exam. Your health care provider will also ask you questions about your medical history. Tests, such as chest X-rays, are sometimes done to rule out other conditions.  TREATMENT  Acute bronchitis usually goes away in a couple weeks. Oftentimes, no medical treatment is necessary. Medicines are sometimes given for relief of fever or cough. Antibiotic medicines are usually not needed but may be prescribed in certain situations. In some cases, an inhaler may be recommended to help reduce shortness of breath and control the cough. A cool mist vaporizer may also be used to help thin bronchial secretions and make it easier to clear the chest.  HOME CARE INSTRUCTIONS  Get plenty of rest.   Drink enough fluids to keep your urine clear or pale yellow (unless you have a medical condition that requires fluid restriction). Increasing fluids may help thin your respiratory secretions (sputum) and reduce chest congestion, and it will prevent dehydration.   Take medicines only as directed by your health care provider.  If you were prescribed an antibiotic medicine, finish it all even if you start to  feel better.  Avoid smoking and secondhand smoke. Exposure to cigarette smoke or irritating chemicals will make bronchitis worse. If you are a smoker, consider using nicotine gum or skin patches to help control withdrawal symptoms. Quitting smoking will help your lungs heal faster.   Reduce the chances of another bout of acute bronchitis by washing your hands frequently, avoiding people  with cold symptoms, and trying not to touch your hands to your mouth, nose, or eyes.   Keep all follow-up visits as directed by your health care provider.  SEEK MEDICAL CARE IF: Your symptoms do not improve after 1 week of treatment.  SEEK IMMEDIATE MEDICAL CARE IF:  You develop an increased fever or chills.   You have chest pain.   You have severe shortness of breath.  You have bloody sputum.   You develop dehydration.  You faint or repeatedly feel like you are going to pass out.  You develop repeated vomiting.  You develop a severe headache. MAKE SURE YOU:   Understand these instructions.  Will watch your condition.  Will get help right away if you are not doing well or get worse.   This information is not intended to replace advice given to you by your health care provider. Make sure you discuss any questions you have with your health care provider.   Document Released: 04/23/2004 Document Revised: 04/06/2014 Document Reviewed: 09/06/2012 Elsevier Interactive Patient Education [employer].

## 2015-07-11 NOTE — Progress Notes (Signed)
[facility] [address], [place] [address] [phone]  Date:  07/11/2015   Name:  Heidi Costa   DOB:  22-Nov-1980   MRN:  [id number]  PCP:  Elizabeth Palau, MD    History of Present Illness:  Heidi Costa is a 35 y.o. female patient who presents to [facility] chief complaint of cough, discussion of blood pressure, and rash on foot.    Cough: Cough that started about 3 days ago. It is a nonproductive cough that is worsened at night. She reports having some wheezing and is using her albuterol which helps. She also reports a sore throat. There is no fever. She does have nasal congestion, and no otalgia. 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  She is a smoker of 10 cigarettes per day Rash: Patient states that the rash on her foot appears to improve and worsen all the time. She is using ketoconazole and Vaseline at the ankle. She reports that it's very pruritic. It only hurts when she scratches it too much.  She states that this was once diagnosed as impetigo and she had placed mupirocin on this area which seemed to help. +Depression screening: Patient has noticed that she has had decline in mood. She has had a loss of interest in doing anything. She feels more agitated and short with her family. She has less energy. She feels worthless at times. She denies any suicidal or homicidal ideations.  Patient feels very overwhelmed. Her husband had been sick 2 years ago. He has just returned to working after 2 years and this is heart time. Patient just feels financial stressors as she feels that they are always trying to make ends meet. She feels that she is the primary breadwinner which is very burdensome for her. She works at a daycare that she enjoys. She is currently not exercising at this time. She states that she had done well on Cymbalta in the past however when she feels that she is improving she will stop taking the medication. The Cymbalta was given to her by first  aid emergency clinic followed by Dr. Hulda Marin. HTN: Patient reports that she is also on blood pressure medication. She felt that she did not need this however has noticed that her blood pressures stay between [id number] and 0000000 systolic. She denies chest pains, palpitations, leg swelling, or blurred vision.  Patient Active Problem List   Diagnosis Date Noted  . Disturbance of skin sensation 05/08/2013  . Cervical spondylosis with myelopathy 05/08/2013  . Abnormality of gait 08/02/2012  . Chronic arthralgias of knees and hips 08/02/2012    Past Medical History  Diagnosis Date  . Bulging lumbar disc   . Anxiety   . Abnormality of gait 08/02/2012  . Chronic arthralgias of knees and hips 08/02/2012  . Obesity   . Depression     Pt. took self off Celexa one month ago, states she did n't like how it made her feel .  Marland Kitchen Asthma   . Heart murmur     pt. been told that this is a fact, no echo or cardiac surveillance in the past   . GERD (gastroesophageal reflux disease)   . Arthritis     spine- cerv.  & arm   . Anemia     iron taking in the past, not currently    Past Surgical History  Procedure Laterality Date  . Vaginal delivery  2002  . Chalazion excision  L eye   . Anterior cervical decomp/discectomy fusion N/A 10/27/2012    Procedure: ANTERIOR CERVICAL DECOMPRESSION/DISCECTOMY FUSION 1 LEVEL;  Surgeon: Faythe Ghee, MD;  Location: [facility];  Service: Neurosurgery;  Laterality: N/A;  ANTERIOR CERVICAL DECOMPRESSION/DISCECTOMY FUSION 1 LEVEL    Social History  Substance Use Topics  . Smoking status: Current Every Day Smoker -- 1.00 packs/day for 10 years    Types: Cigarettes  . Smokeless tobacco: Never Used  . Alcohol Use: Yes     Comment: occas    Family History  Problem Relation Age of Onset  . Osteoarthritis Mother   . Hypertension Mother   . Hypertension Father   . Gout Father   . Stroke Father     Allergies  Allergen Reactions  . Aspirin Rash    Medication  list has been reviewed and updated.  Current Outpatient Prescriptions on File Prior to Visit  Medication Sig Dispense Refill  . acetaminophen (TYLENOL) 500 MG tablet Take 500-1,000 mg by mouth daily as needed for pain.     Marland Kitchen ketoconazole (NIZORAL) 2 % cream Apply 1 application topically daily. 60 g 0  . gabapentin (NEURONTIN) 300 MG capsule 1 capsule in the morning, at midday, and 2 capsules at night (Patient not taking: Reported on 01/29/2015) 120 capsule 5   No current facility-administered medications on file prior to visit.    ROS ROS otherwise unremarkable unless listed above.  Physical Examination: BP 142/100 mmHg  Pulse 91  Temp(Src) 98.5 F (36.9 C) (Oral)  Resp 18  Ht 5' 5.5" (1.664 m)  Wt 244 lb 8 oz (110.904 kg)  BMI 40.05 kg/m2  SpO2 98% Ideal Body Weight: Weight in (lb) to have BMI = 25: 152.2  Physical Exam  Constitutional: She is oriented to person, place, and time. She appears well-developed and well-nourished. No distress.  HENT:  Head: Normocephalic and atraumatic.  Right Ear: Tympanic membrane, external ear and ear canal normal.  Left Ear: Tympanic membrane, external ear and ear canal normal.  Nose: Mucosal edema and rhinorrhea present. Right sinus exhibits no maxillary sinus tenderness and no frontal sinus tenderness. Left sinus exhibits no maxillary sinus tenderness and no frontal sinus tenderness.  Mouth/Throat: No uvula swelling. No oropharyngeal exudate, posterior oropharyngeal edema or posterior oropharyngeal erythema.  Eyes: Conjunctivae and EOM are normal. Pupils are equal, round, and reactive to light.  Cardiovascular: Normal rate and regular rhythm.  Exam reveals no gallop, no distant heart sounds and no friction rub.   No murmur heard. Pulses:      Radial pulses are 2+ on the right side, and 2+ on the left side.       Dorsalis pedis pulses are 2+ on the right side, and 2+ on the left side.  Pulmonary/Chest: Effort normal. No respiratory distress.  She has no decreased breath sounds. She has wheezes (scant expiratory wheezing more on the right lobes). She has no rhonchi.  Lymphadenopathy:       Head (right side): No submandibular, no tonsillar, no preauricular and no posterior auricular adenopathy present.       Head (left side): No submandibular, no tonsillar, no preauricular and no posterior auricular adenopathy present.  Neurological: She is alert and oriented to person, place, and time.  Skin: She is not diaphoretic.  Medial ankle with scaling patch about 6cm in length.  Scaling dry patching with a raised purplish hue underneath and along the borders of the scaling. There are scant purplish papules bordering rash.  Nontender.   Psychiatric: She has a normal mood and affect. Her behavior is normal.   Results for orders placed or performed in visit on 07/11/15  POCT CBC  Result Value Ref Range   WBC 7.9 4.6 - 10.2 K/uL   Lymph, poc 1.9 0.6 - 3.4   POC LYMPH PERCENT 24.0 10 - 50 %L   MID (cbc) 0.4 0 - 0.9   POC MID % 5.4 0 - 12 %M   POC Granulocyte 5.6 2 - 6.9   Granulocyte percent 70.6 37 - 80 %G   RBC 4.04 4.04 - 5.48 M/uL   Hemoglobin 13.2 12.2 - 16.2 g/dL   HCT, POC 37.6 (A) 37.7 - 47.9 %   MCV 93.1 80 - 97 fL   MCH, POC 32.7 (A) 27 - 31.2 pg   MCHC 35.1 31.8 - 35.4 g/dL   RDW, POC 13.8 %   Platelet Count, POC 297 142 - 424 K/uL   MPV 7.2 0 - 99.8 fL   Procedure: Verbal consent obtained. Area meant for biopsy is cleansed with alcohol pad at the medial ankle of the left foot. Injected 2% lidocaine into the procedure site. 2 cm punch apparatus utilized to retrieve sample. Removed with forceps. 2 x 2 calls and cope and used to compress area for dressing.  Assessment and Plan: SELENNA HOMMEL is a 35 y.o. female who is here today For chief complaint of cough, wheezing, blood pressure, and rash on foot. --Cough likely viral exacerbating her asthma. I've asked her to use Mucinex, increase her hydration, and to use the  albuterol at this time. She will let me know if her symptoms do not resolve within the next 7 days. --We will start a blood pressure medication at this time. I have advised starting amlodipine. She will check her blood pressure twice a week.  If blood pressure is over 140/90 she will return within 2 weeks.  If this stays under then she will return in 4 weeks for follow-up along with the Cymbalta monitoring as discussed below. --Skin rash differential includes dyshidrotic eczema, psoriasis, fungal outbreak, or bacterial infection. She has tried multiple treatments. We will to a biopsy at this time prior to any treatment. --We will restart Cymbalta at this time. At the advised that she start with 30 mg daily for 1 week and increase to the 60 mg. She will follow-up in 4 weeks regarding this. Cough - Plan: POCT CBC, COMPLETE METABOLIC PANEL WITH GFR, DULoxetine (CYMBALTA) 30 MG capsule, Guaifenesin (MUCINEX MAXIMUM STRENGTH) 1200 MG TB12, chlorpheniramine-HYDROcodone (TUSSIONEX PENNKINETIC ER) 10-8 MG/5ML SUER, CANCELED: DG Chest 2 View  Dyspnea - Plan: POCT CBC, COMPLETE METABOLIC PANEL WITH GFR, DULoxetine (CYMBALTA) 30 MG capsule, Guaifenesin (MUCINEX MAXIMUM STRENGTH) 1200 MG TB12, CANCELED: DG Chest 2 View  Essential hypertension - Plan: amLODipine (NORVASC) 5 MG tablet  Rash/skin eruption - Plan: Dermatology pathology  Acute bronchitis, unspecified organism   Ivar Drape, PA-C [facility] 07/11/2015 6:31 PM

## 2015-07-12 LAB — COMPLETE METABOLIC PANEL WITH GFR
ALT: 18 U/L (ref 6–29)
AST: 23 U/L (ref 10–30)
Albumin: 4.2 g/dL (ref 3.6–5.1)
Alkaline Phosphatase: 58 U/L (ref 33–115)
BUN: 10 mg/dL (ref 7–25)
CHLORIDE: 106 mmol/L (ref 98–110)
CO2: 26 mmol/L (ref 20–31)
Calcium: 9.1 mg/dL (ref 8.6–10.2)
Creat: 0.79 mg/dL (ref 0.50–1.10)
GFR, Est Non African American: >89 mL/min (ref >=60)
Glucose, Bld: 98 mg/dL (ref 65–99)
Potassium: 4.9 mmol/L (ref 3.5–5.3)
Sodium: 139 mmol/L (ref 135–146)
Total Bilirubin: 0.3 mg/dL (ref 0.2–1.2)
Total Protein: 6.9 g/dL (ref 6.1–8.1)

## 2015-07-15 ENCOUNTER — Telehealth: Payer: Self-pay

## 2015-07-15 NOTE — Telephone Encounter (Signed)
Pt states she received a small amount of cough medication should this have been what she got   Best number [phone]

## 2015-07-16 NOTE — Telephone Encounter (Signed)
Advised to call pharmacyy. Only small amounts are given.

## 2015-07-18 ENCOUNTER — Ambulatory Visit (INDEPENDENT_AMBULATORY_CARE_PROVIDER_SITE_OTHER): Payer: BLUE CROSS/BLUE SHIELD | Admitting: Urgent Care

## 2015-07-18 ENCOUNTER — Ambulatory Visit (INDEPENDENT_AMBULATORY_CARE_PROVIDER_SITE_OTHER): Payer: BLUE CROSS/BLUE SHIELD

## 2015-07-18 VITALS — BP 134/82 | HR 90 | Temp 97.7°F | Resp 18 | Ht 65.0 in | Wt 238.0 lb

## 2015-07-18 DIAGNOSIS — R05 Cough: Secondary | ICD-10-CM

## 2015-07-18 DIAGNOSIS — R0602 Shortness of breath: Secondary | ICD-10-CM

## 2015-07-18 DIAGNOSIS — R059 Cough, unspecified: Secondary | ICD-10-CM

## 2015-07-18 DIAGNOSIS — R0789 Other chest pain: Secondary | ICD-10-CM

## 2015-07-18 DIAGNOSIS — R062 Wheezing: Secondary | ICD-10-CM

## 2015-07-18 DIAGNOSIS — J4530 Mild persistent asthma, uncomplicated: Secondary | ICD-10-CM | POA: Diagnosis not present

## 2015-07-18 LAB — POCT URINE PREGNANCY: PREG TEST UR: NEGATIVE

## 2015-07-18 MED ORDER — BENZONATATE 100 MG PO CAPS: 100.0000 mg | ORAL_CAPSULE | Freq: Three times a day (TID) | ORAL | Status: DC | PRN

## 2015-07-18 MED ORDER — HYDROCODONE-HOMATROPINE 5-1.5 MG/5ML PO SYRP: 5.0000 mL | ORAL_SOLUTION | Freq: Every evening | ORAL | Status: DC | PRN

## 2015-07-18 MED ORDER — AZITHROMYCIN 250 MG PO TABS: ORAL_TABLET | ORAL | Status: DC

## 2015-07-18 MED ORDER — ALBUTEROL SULFATE (2.5 MG/3ML) 0.083% IN NEBU: 2.5000 mg | INHALATION_SOLUTION | Freq: Four times a day (QID) | RESPIRATORY_TRACT | Status: DC | PRN

## 2015-07-18 MED ORDER — PREDNISONE 20 MG PO TABS: ORAL_TABLET | ORAL | Status: DC

## 2015-07-18 NOTE — Patient Instructions (Addendum)
Cough, Adult Coughing is a reflex that clears your throat and your airways. Coughing helps to heal and protect your lungs. It is normal to cough occasionally, but a cough that happens with other symptoms or lasts a long time may be a sign of a condition that needs treatment. A cough may last only 2-3 weeks (acute), or it may last longer than 8 weeks (chronic). CAUSES Coughing is commonly caused by:  Breathing in substances that irritate your lungs.  A viral or bacterial respiratory infection.  Allergies.  Asthma.  Postnasal drip.  Smoking.  Acid backing up from the stomach into the esophagus (gastroesophageal reflux).  Certain medicines.  Chronic lung problems, including COPD (or rarely, lung cancer).  Other medical conditions such as heart failure. HOME CARE INSTRUCTIONS  Pay attention to any changes in your symptoms. Take these actions to help with your discomfort:  Take medicines only as told by your health care provider.  If you were prescribed an antibiotic medicine, take it as told by your health care provider. Do not stop taking the antibiotic even if you start to feel better.  Talk with your health care provider before you take a cough suppressant medicine.  Drink enough fluid to keep your urine clear or pale yellow.  If the air is dry, use a cold steam vaporizer or humidifier in your bedroom or your home to help loosen secretions.  Avoid anything that causes you to cough at work or at home.  If your cough is worse at night, try sleeping in a semi-upright position.  Avoid cigarette smoke. If you smoke, quit smoking. If you need help quitting, ask your health care provider.  Avoid caffeine.  Avoid alcohol.  Rest as needed. SEEK MEDICAL CARE IF:   You have new symptoms.  You cough up pus.  Your cough does not get better after 2-3 weeks, or your cough gets worse.  You cannot control your cough with suppressant medicines and you are losing sleep.  You  develop pain that is getting worse or pain that is not controlled with pain medicines.  You have a fever.  You have unexplained weight loss.  You have night sweats. SEEK IMMEDIATE MEDICAL CARE IF:  You cough up blood.  You have difficulty breathing.  Your heartbeat is very fast.   This information is not intended to replace advice given to you by your health care provider. Make sure you discuss any questions you have with your health care provider.   Document Released: 09/12/2010 Document Revised: 12/05/2014 Document Reviewed: 05/23/2014 Elsevier Interactive Patient Education 2016 Elsevier Inc[employer]    Asthma, Adult Asthma is a recurring condition in which the airways tighten and narrow. Asthma can make it difficult to breathe. It can cause coughing, wheezing, and shortness of breath. Asthma episodes, also called asthma attacks, range from minor to life-threatening. Asthma cannot be cured, but medicines and lifestyle changes can help control it. CAUSES Asthma is believed to be caused by inherited (genetic) and environmental factors, but its exact cause is unknown. Asthma may be triggered by allergens, lung infections, or irritants in the air. Asthma triggers are different for each person. Common triggers include:   Animal dander.  Dust mites.  Cockroaches.  Pollen from trees or grass.  Mold.  Smoke.  Air pollutants such as dust, household cleaners, hair sprays, aerosol sprays, paint fumes, strong chemicals, or strong odors.  Cold air, weather changes, and winds (which increase molds and pollens in the air).  Strong emotional expressions  such as crying or laughing hard.  Stress.  Certain medicines (such as aspirin) or types of drugs (such as beta-blockers).  Sulfites in foods and drinks. Foods and drinks that may contain sulfites include dried fruit, potato chips, and sparkling grape juice.  Infections or inflammatory conditions such as the flu, a cold, or an  inflammation of the nasal membranes (rhinitis).  Gastroesophageal reflux disease (GERD).  Exercise or strenuous activity. SYMPTOMS Symptoms may occur immediately after asthma is triggered or many hours later. Symptoms include:  Wheezing.  Excessive nighttime or early morning coughing.  Frequent or severe coughing with a common cold.  Chest tightness.  Shortness of breath. DIAGNOSIS  The diagnosis of asthma is made by a review of your medical history and a physical exam. Tests may also be performed. These may include:  Lung function studies. These tests show how much air you breathe in and out.  Allergy tests.  Imaging tests such as X-rays. TREATMENT  Asthma cannot be cured, but it can usually be controlled. Treatment involves identifying and avoiding your asthma triggers. It also involves medicines. There are 2 classes of medicine used for asthma treatment:   Controller medicines. These prevent asthma symptoms from occurring. They are usually taken every day.  Reliever or rescue medicines. These quickly relieve asthma symptoms. They are used as needed and provide short-term relief. Your health care provider will help you create an asthma action plan. An asthma action plan is a written plan for managing and treating your asthma attacks. It includes a list of your asthma triggers and how they may be avoided. It also includes information on when medicines should be taken and when their dosage should be changed. An action plan may also involve the use of a device called a peak flow meter. A peak flow meter measures how well the lungs are working. It helps you monitor your condition. HOME CARE INSTRUCTIONS   Take medicines only as directed by your health care provider. Speak with your health care provider if you have questions about how or when to take the medicines.  Use a peak flow meter as directed by your health care provider. Record and keep track of readings.  Understand and  use the action plan to help minimize or stop an asthma attack without needing to seek medical care.  Control your home environment in the following ways to help prevent asthma attacks:  Do not smoke. Avoid being exposed to secondhand smoke.  Change your heating and air conditioning filter regularly.  Limit your use of fireplaces and wood stoves.  Get rid of pests (such as roaches and mice) and their droppings.  Throw away plants if you see mold on them.  Clean your floors and dust regularly. Use unscented cleaning products.  Try to have someone else vacuum for you regularly. Stay out of rooms while they are being vacuumed and for a short while afterward. If you vacuum, use a dust mask from a hardware store, a double-layered or microfilter vacuum cleaner bag, or a vacuum cleaner with a HEPA filter.  Replace carpet with wood, tile, or vinyl flooring. Carpet can trap dander and dust.  Use allergy-proof pillows, mattress covers, and box spring covers.  Wash bed sheets and blankets every week in hot water and dry them in a dryer.  Use blankets that are made of polyester or cotton.  Clean bathrooms and kitchens with bleach. If possible, have someone repaint the walls in these rooms with mold-resistant paint. Keep out of  the rooms that are being cleaned and painted.  Wash hands frequently. SEEK MEDICAL CARE IF:   You have wheezing, shortness of breath, or a cough even if taking medicine to prevent attacks.  The colored mucus you cough up (sputum) is thicker than usual.  Your sputum changes from clear or white to yellow, green, gray, or bloody.  You have any problems that may be related to the medicines you are taking (such as a rash, itching, swelling, or trouble breathing).  You are using a reliever medicine more than 2-3 times per week.  Your peak flow is still at 50-79% of your personal best after following your action plan for 1 hour.  You have a fever. SEEK IMMEDIATE MEDICAL  CARE IF:   You seem to be getting worse and are unresponsive to treatment during an asthma attack.  You are short of breath even at rest.  You get short of breath when doing very little physical activity.  You have difficulty eating, drinking, or talking due to asthma symptoms.  You develop chest pain.  You develop a fast heartbeat.  You have a bluish color to your lips or fingernails.  You are light-headed, dizzy, or faint.  Your peak flow is less than 50% of your personal best.   This information is not intended to replace advice given to you by your health care provider. Make sure you discuss any questions you have with your health care provider.   Document Released: 03/16/2005 Document Revised: 12/05/2014 Document Reviewed: 10/13/2012 Elsevier Interactive Patient Education 2016 [employer].     IF you received an x-ray today, you will receive an invoice from [facility]. Please contact [facility] Radiology at [phone] with questions or concerns regarding your invoice.   IF you received labwork today, you will receive an invoice from [employer]. Please contact [facility] at [phone] with questions or concerns regarding your invoice.   Our billing staff will not be able to assist you with questions regarding bills from these companies.  You will be contacted with the lab results as soon as they are available. The fastest way to get your results is to activate your My Chart account. Instructions are located on the last page of this paperwork. If you have not heard from [place] regarding the results in 2 weeks, please contact this office.

## 2015-07-18 NOTE — Progress Notes (Signed)
MRN: [id number] DOB: 1980-08-14  Subjective:   Heidi Costa is a 35 y.o. female presenting for chief complaint of Follow-up  Reports 1.5 weeks of mildly productive cough without hemoptysis, cough elicits chest pain and shob. She has also felt her belly sore from her coughing. She was seen here on 07/11/2015, managed for supportively for viral bronchitis. Has been trying Mucinex, albuterol (4x daily), cough syrup, ibuprofen and APAP. Admits history of asthma. Admits history of allergies but does not take any medications for this. Denies fever, nausea, vomiting. Smokes 1-2 cigarettes per day. She is trying to quit.   Heidi Costa has a current medication list which includes the following prescription(s): acetaminophen, albuterol, amlodipine, duloxetine, ketoconazole, chlorpheniramine-hydrocodone, gabapentin, and guaifenesin. Also is allergic to aspirin.  Heidi Costa  has a past medical history of Bulging lumbar disc; Anxiety; Abnormality of gait (08/02/2012); Chronic arthralgias of knees and hips (08/02/2012); Obesity; Depression; Asthma; Heart murmur; GERD (gastroesophageal reflux disease); Arthritis; and Anemia. Also  has past surgical history that includes Vaginal delivery (2002); Chalazion excision; and Anterior cervical decomp/discectomy fusion (N/A, 10/27/2012).  Objective:   Vitals: BP 134/82 mmHg  Pulse 90  Temp(Src) 97.7 F (36.5 C) (Oral)  Resp 18  Ht 5\' 5"  (1.651 m)  Wt 238 lb (107.956 kg)  BMI 39.61 kg/m2  SpO2 97%  LMP 07/11/2015  Physical Exam  Constitutional: She is oriented to person, place, and time. She appears well-developed and well-nourished.  HENT:  Mouth/Throat: Oropharynx is clear and moist.  Eyes: Right eye exhibits no discharge. Left eye exhibits no discharge. No scleral icterus.  Neck: Normal range of motion. Neck supple.  Cardiovascular: Normal rate, regular rhythm and intact distal pulses.   Pulmonary/Chest: No respiratory distress. She has wheezes  (throughout). She has rales (mid-lower bases).  Abdominal: Soft. Bowel sounds are normal. She exhibits no distension and no mass. There is no tenderness.  Musculoskeletal: She exhibits no edema.  Lymphadenopathy:    She has no cervical adenopathy.  Neurological: She is alert and oriented to person, place, and time.  Skin: Skin is warm and dry.   Results for orders placed or performed in visit on 07/18/15 (from the past 24 hour(s))  POCT urine pregnancy     Status: None   Collection Time: 07/18/15 11:10 AM  Result Value Ref Range   Preg Test, Ur Negative Negative   Dg Chest 2 View  07/18/2015  CLINICAL DATA:  Ten days of productive cough with chest pain and shortness of breath ; chest and abdominal discomfort due to coughing ; history of asthma and current smoking. EXAM: CHEST  2 VIEW COMPARISON:  Chest x-ray of Aug 26, 2013 FINDINGS: The lungs are mildly hyperinflated. There is subtle increased density anteriorly in the left lower lobe more conspicuous than in the past but this is most likely due to overlying breast parenchyma. There is no significant pleural effusion. The right lung is clear. The heart and pulmonary vascularity are normal. The mediastinum is normal in width. The bony thorax exhibits no acute abnormality. IMPRESSION: Borderline hyperinflation consistent with reactive airway disease. There is no evidence of pneumonia. Electronically Signed   By: David  Martinique M.D.   On: 07/18/2015 11:32   Assessment and Plan :   1. Cough 2. Atypical chest pain 3. Extrinsic asthma, mild persistent, uncomplicated 4. Shortness of breath 5. Wheezing - Patient is very concerned with infection given that she was told she had bronchitis. I agreed to offer azithromycin. I also advised patient use  nebulizer, start steroid course. Hycodan and Tessalon for cough, shob. RTC in 1 week if no improvement.  Jaynee Eagles, PA-C [facility] [phone] 07/18/2015  10:52 AM

## 2015-09-20 ENCOUNTER — Ambulatory Visit (INDEPENDENT_AMBULATORY_CARE_PROVIDER_SITE_OTHER): Payer: BLUE CROSS/BLUE SHIELD | Admitting: Urgent Care

## 2015-09-20 VITALS — BP 140/90 | HR 67 | Temp 98.6°F | Resp 17 | Ht 65.5 in | Wt 245.2 lb

## 2015-09-20 DIAGNOSIS — Z111 Encounter for screening for respiratory tuberculosis: Secondary | ICD-10-CM | POA: Diagnosis not present

## 2015-09-20 DIAGNOSIS — Z7185 Encounter for immunization safety counseling: Secondary | ICD-10-CM

## 2015-09-20 DIAGNOSIS — R030 Elevated blood-pressure reading, without diagnosis of hypertension: Secondary | ICD-10-CM

## 2015-09-20 DIAGNOSIS — E669 Obesity, unspecified: Secondary | ICD-10-CM | POA: Diagnosis not present

## 2015-09-20 DIAGNOSIS — I10 Essential (primary) hypertension: Secondary | ICD-10-CM

## 2015-09-20 DIAGNOSIS — Z7189 Other specified counseling: Secondary | ICD-10-CM

## 2015-09-20 DIAGNOSIS — R21 Rash and other nonspecific skin eruption: Secondary | ICD-10-CM

## 2015-09-20 DIAGNOSIS — F17200 Nicotine dependence, unspecified, uncomplicated: Secondary | ICD-10-CM | POA: Diagnosis not present

## 2015-09-20 MED ORDER — TRIAMCINOLONE 0.1 % CREAM:EUCERIN CREAM 1:1: 1.0000 "application " | TOPICAL_CREAM | Freq: Two times a day (BID) | CUTANEOUS | Status: DC

## 2015-09-20 NOTE — Progress Notes (Signed)
    MRN: [id number] DOB: August 29, 1980  Subjective:   Heidi Costa is a 35 y.o. female presenting for chief complaint of lab results and PPD testing  Rash - Reports persistent rash over her left ankle. She had a biopsy performed in 05/2015, results showed negative fungal infection and deferred to contact dermatitis. Patient has undergone treatment with ketoconazole, oral steroid and the rash has persisted.   Elevated BP - managed with amlodipine. Admits intermittent headaches the past 2 days, does not hydrate well. Diet is unhealthy, does not exercise, drinks sodas, smokes 1/2 ppd and is not actively trying to quit. Denies chest pain, heart racing, shob, n/v, abdominal pain, hematuria, lower leg swelling.  TB screening - patient needs immunization review to attend [facility]. She also needs screening for tuberculosis.  Heidi Costa has a current medication list which includes the following prescription(s): acetaminophen, albuterol, and amlodipine. Also is allergic to aspirin.  Heidi Costa  has a past medical history of Bulging lumbar disc; Anxiety; Abnormality of gait (08/02/2012); Chronic arthralgias of knees and hips (08/02/2012); Obesity; Depression; Asthma; Heart murmur; GERD (gastroesophageal reflux disease); Arthritis; and Anemia. Also  has past surgical history that includes Vaginal delivery (2002); Chalazion excision; and Anterior cervical decomp/discectomy fusion (N/A, 10/27/2012).  Objective:   Vitals: BP 140/90 mmHg  Pulse 67  Temp(Src) 98.6 F (37 C) (Oral)  Resp 17  Ht 5' 5.5" (1.664 m)  Wt 245 lb 3.2 oz (111.222 kg)  BMI 40.17 kg/m2  SpO2 98%  LMP 09/03/2015  BP Readings from Last 3 Encounters:  09/20/15 188/106  07/18/15 134/82  07/11/15 142/100    Physical Exam  Constitutional: She is oriented to person, place, and time. She appears well-developed and well-nourished.  HENT:  Mouth/Throat: Oropharynx is clear and moist.  Eyes: No scleral icterus.  Neck: Normal range of motion.  Neck supple. No thyromegaly present.  Cardiovascular: Normal rate, regular rhythm and intact distal pulses.  Exam reveals no gallop and no friction rub.   No murmur heard. Pulmonary/Chest: No respiratory distress. She has no wheezes. She has no rales.  Musculoskeletal: She exhibits no edema.  Neurological: She is alert and oriented to person, place, and time.  Skin: Skin is warm and dry.   Assessment and Plan :   1. Rash and nonspecific skin eruption - Start triamcinolone-eucerin mix, referral to dermatology pending.   2. Immunization counseling 3. Tuberculosis screening - Quantiferon tb gold assay (blood), titers pending. - Patient plans on bringing in her immunization records soon.  4. Essential hypertension 5. Elevated blood pressure reading 6. Obesity 7. Tobacco use disorder - Patient will f/u in 1 week for recheck of her BP. I recommended lifestyle modifications and counseled her on risks of uncontrolled HTN. She verbalized understanding.   Jaynee Eagles, PA-C [facility] [phone] 09/20/2015 1:02 PM

## 2015-09-20 NOTE — Patient Instructions (Addendum)
Rash A rash is a change in the color or texture of the skin. There are many different types of rashes. You may have other problems that accompany your rash. CAUSES   Infections.  Allergic reactions. This can include allergies to pets or foods.  Certain medicines.  Exposure to certain chemicals, soaps, or cosmetics.  Heat.  Exposure to poisonous plants.  Tumors, both cancerous and noncancerous. SYMPTOMS   Redness.  Scaly skin.  Itchy skin.  Dry or cracked skin.  Bumps.  Blisters.  Pain. DIAGNOSIS  Your caregiver may do a physical exam to determine what type of rash you have. A skin sample (biopsy) may be taken and examined under a microscope. TREATMENT  Treatment depends on the type of rash you have. Your caregiver may prescribe certain medicines. For serious conditions, you may need to see a skin doctor (dermatologist). HOME CARE INSTRUCTIONS   Avoid the substance that caused your rash.  Do not scratch your rash. This can cause infection.  You may take cool baths to help stop itching.  Only take over-the-counter or prescription medicines as directed by your caregiver.  Keep all follow-up appointments as directed by your caregiver. SEEK IMMEDIATE MEDICAL CARE IF:  You have increasing pain, swelling, or redness.  You have a fever.  You have new or severe symptoms.  You have body aches, diarrhea, or vomiting.  Your rash is not better after 3 days. MAKE SURE YOU:  Understand these instructions.  Will watch your condition.  Will get help right away if you are not doing well or get worse.   This information is not intended to replace advice given to you by your health care provider. Make sure you discuss any questions you have with your health care provider.   Document Released: 03/06/2002 Document Revised: 04/06/2014 Document Reviewed: 08/01/2014 Elsevier Interactive Patient Education 2016 [employer].    Hypertension Hypertension, commonly  called high blood pressure, is when the force of blood pumping through your arteries is too strong. Your arteries are the blood vessels that carry blood from your heart throughout your body. A blood pressure reading consists of a higher number over a lower number, such as 110/72. The higher number (systolic) is the pressure inside your arteries when your heart pumps. The lower number (diastolic) is the pressure inside your arteries when your heart relaxes. Ideally you want your blood pressure below 120/80. Hypertension forces your heart to work harder to pump blood. Your arteries may become narrow or stiff. Having untreated or uncontrolled hypertension can cause heart attack, stroke, kidney disease, and other problems. RISK FACTORS Some risk factors for high blood pressure are controllable. Others are not.  Risk factors you cannot control include:   Race. You may be at higher risk if you are African American.  Age. Risk increases with age.  Gender. Men are at higher risk than women before age 21 years. After age 56, women are at higher risk than men. Risk factors you can control include:  Not getting enough exercise or physical activity.  Being overweight.  Getting too much fat, sugar, calories, or salt in your diet.  Drinking too much alcohol. SIGNS AND SYMPTOMS Hypertension does not usually cause signs or symptoms. Extremely high blood pressure (hypertensive crisis) may cause headache, anxiety, shortness of breath, and nosebleed. DIAGNOSIS To check if you have hypertension, your health care provider will measure your blood pressure while you are seated, with your arm held at the level of your heart. It should be  measured at least twice using the same arm. Certain conditions can cause a difference in blood pressure between your right and left arms. A blood pressure reading that is higher than normal on one occasion does not mean that you need treatment. If it is not clear whether you have  high blood pressure, you may be asked to return on a different day to have your blood pressure checked again. Or, you may be asked to monitor your blood pressure at home for 1 or more weeks. TREATMENT Treating high blood pressure includes making lifestyle changes and possibly taking medicine. Living a healthy lifestyle can help lower high blood pressure. You may need to change some of your habits. Lifestyle changes may include:  Following the DASH diet. This diet is high in fruits, vegetables, and whole grains. It is low in salt, red meat, and added sugars.  Keep your sodium intake below 2,300 mg per day.  Getting at least 30-45 minutes of aerobic exercise at least 4 times per week.  Losing weight if necessary.  Not smoking.  Limiting alcoholic beverages.  Learning ways to reduce stress. Your health care provider may prescribe medicine if lifestyle changes are not enough to get your blood pressure under control, and if one of the following is true:  You are 37-25 years of age and your systolic blood pressure is above 140.  You are 68 years of age or older, and your systolic blood pressure is above 150.  Your diastolic blood pressure is above 90.  You have diabetes, and your systolic blood pressure is over [id number] or your diastolic blood pressure is over 90.  You have kidney disease and your blood pressure is above 140/90.  You have heart disease and your blood pressure is above 140/90. Your personal target blood pressure may vary depending on your medical conditions, your age, and other factors. HOME CARE INSTRUCTIONS  Have your blood pressure rechecked as directed by your health care provider.   Take medicines only as directed by your health care provider. Follow the directions carefully. Blood pressure medicines must be taken as prescribed. The medicine does not work as well when you skip doses. Skipping doses also puts you at risk for problems.  Do not smoke.   Monitor your  blood pressure at home as directed by your health care provider. SEEK MEDICAL CARE IF:   You think you are having a reaction to medicines taken.  You have recurrent headaches or feel dizzy.  You have swelling in your ankles.  You have trouble with your vision. SEEK IMMEDIATE MEDICAL CARE IF:  You develop a severe headache or confusion.  You have unusual weakness, numbness, or feel faint.  You have severe chest or abdominal pain.  You vomit repeatedly.  You have trouble breathing. MAKE SURE YOU:   Understand these instructions.  Will watch your condition.  Will get help right away if you are not doing well or get worse.   This information is not intended to replace advice given to you by your health care provider. Make sure you discuss any questions you have with your health care provider.   Document Released: 03/16/2005 Document Revised: 07/31/2014 Document Reviewed: 01/06/2013 Elsevier Interactive Patient Education 2016 [employer].     IF you received an x-ray today, you will receive an invoice from [facility]. Please contact [facility] at [phone] with questions or concerns regarding your invoice.   IF you received labwork today, you will receive an invoice from [facility]. Please contact [facility] at [phone] with questions or concerns regarding your invoice.   Our billing staff will not be able to assist you with questions regarding bills from these companies.  You will be contacted with the lab results as soon as they are available. The fastest way to get your results is to activate your My Chart account. Instructions are located on the last page of this paperwork. If you have not heard from [place] regarding the results in 2 weeks, please contact this office.

## 2015-09-23 LAB — QUANTIFERON TB GOLD ASSAY (BLOOD)
INTERFERON GAMMA RELEASE ASSAY: NEGATIVE
Mitogen-Nil: >10 IU/mL
QUANTIFERON NIL VALUE: 0.03 [IU]/mL
Quantiferon Tb Ag Minus Nil Value: 0 IU/mL

## 2015-09-23 LAB — MEASLES/MUMPS/RUBELLA IMMUNITY
Mumps IgG: 43.4 AU/mL — ABNORMAL HIGH (ref <9.00)
RUBELLA: 2.38 {index} — AB (ref <0.90)
Rubeola IgG: <25 AU/mL (ref <25.00)

## 2015-09-23 LAB — VARICELLA ZOSTER ANTIBODY, IGG: Varicella IgG: <135 Index (ref <135.00)

## 2015-11-02 ENCOUNTER — Ambulatory Visit: Payer: BLUE CROSS/BLUE SHIELD

## 2015-11-04 ENCOUNTER — Ambulatory Visit (INDEPENDENT_AMBULATORY_CARE_PROVIDER_SITE_OTHER): Payer: BLUE CROSS/BLUE SHIELD | Admitting: Physician Assistant

## 2015-11-04 VITALS — BP 132/76 | HR 75 | Temp 98.4°F | Resp 18 | Ht 65.5 in | Wt 246.0 lb

## 2015-11-04 DIAGNOSIS — Z23 Encounter for immunization: Secondary | ICD-10-CM | POA: Diagnosis not present

## 2015-11-04 LAB — POCT URINE PREGNANCY: PREG TEST UR: NEGATIVE

## 2015-11-04 NOTE — Progress Notes (Signed)
   Heidi Costa  MRN: [id number] DOB: 11/01/1980  Subjective:  Heidi Costa is a 35 y.o. female seen in office today for a chief complaint of need of immunizations. Pt needs tetanus shot and MMR for admissions to [facility]. MMR titers were tested in 08/2015. Pt had immunity to rubella and mumps but not to rubeola.    No other questions or concerns today.  LMP 10/01/15.   Review of Systems  Constitutional: Negative for fever.       No recent illness.   HENT: Negative for congestion.     Patient Active Problem List   Diagnosis Date Noted  . Disturbance of skin sensation 05/08/2013  . Cervical spondylosis with myelopathy 05/08/2013  . Abnormality of gait 08/02/2012  . Chronic arthralgias of knees and hips 08/02/2012    Current Outpatient Prescriptions on File Prior to Visit  Medication Sig Dispense Refill  . acetaminophen (TYLENOL) 500 MG tablet Take 500-1,000 mg by mouth daily as needed for pain.     Marland Kitchen albuterol (PROVENTIL HFA;VENTOLIN HFA) 108 (90 Base) MCG/ACT inhaler Inhale into the lungs every 6 (six) hours as needed for wheezing or shortness of breath.    . Triamcinolone Acetonide (TRIAMCINOLONE 0.1 % CREAM : EUCERIN) CREA Apply 1 application topically 2 (two) times daily. 1 each 1  . amLODipine (NORVASC) 5 MG tablet Take 1 tablet (5 mg total) by mouth daily. (Patient not taking: Reported on 11/04/2015) 30 tablet 5   No current facility-administered medications on file prior to visit.     Allergies  Allergen Reactions  . Aspirin Rash    Objective:  BP 132/76 (BP Location: Right Arm, Patient Position: Sitting, Cuff Size: Large)   Pulse 75   Temp 98.4 F (36.9 C) (Oral)   Resp 18   Ht 5' 5.5" (1.664 m)   Wt 246 lb (111.6 kg)   LMP 10/01/2015   SpO2 99%   BMI 40.31 kg/m   Physical Exam  Constitutional: She is oriented to person, place, and time and well-developed, well-nourished, and in no distress.  HENT:  Head: Normocephalic and atraumatic.  Pulmonary/Chest:  Effort normal.  Neurological: She is alert and oriented to person, place, and time. Gait normal.  Psychiatric: Affect normal.  Vitals reviewed.  Results for orders placed or performed in visit on 11/04/15 (from the past 24 hour(s))  POCT urine pregnancy     Status: None   Collection Time: 11/04/15 11:15 AM  Result Value Ref Range   Preg Test, Ur Negative Negative   Assessment and Plan :  1. Need for vaccine for TD (tetanus-diphtheria) - Td vaccine greater than or equal to 7yo preservative free IM  2. Need for MMR vaccine - MMR vaccine subcutaneous - POCT urine pregnancy   [name] PA-C  [facility] 11/04/2015 10:24 AM

## 2015-11-04 NOTE — Patient Instructions (Addendum)
  Thank you for letting me participate in your health and well being!    IF you received an x-ray today, you will receive an invoice from [facility]. Please contact [facility] at [phone] with questions or concerns regarding your invoice.   IF you received labwork today, you will receive an invoice from [employer]. Please contact [facility] at [phone] with questions or concerns regarding your invoice.   Our billing staff will not be able to assist you with questions regarding bills from these companies.  You will be contacted with the lab results as soon as they are available. The fastest way to get your results is to activate your My Chart account. Instructions are located on the last page of this paperwork. If you have not heard from [place] regarding the results in 2 weeks, please contact this office.

## 2016-08-07 ENCOUNTER — Other Ambulatory Visit (HOSPITAL_COMMUNITY)
Admission: RE | Admit: 2016-08-07 | Discharge: 2016-08-07 | Disposition: A | Discharge status: Home / Self Care | Payer: BLUE CROSS/BLUE SHIELD | Source: Ambulatory Visit | Attending: Nurse Practitioner | Admitting: Nurse Practitioner

## 2016-08-07 ENCOUNTER — Encounter: Payer: Self-pay | Admitting: Nurse Practitioner

## 2016-08-07 ENCOUNTER — Ambulatory Visit (INDEPENDENT_AMBULATORY_CARE_PROVIDER_SITE_OTHER): Payer: BLUE CROSS/BLUE SHIELD | Admitting: Nurse Practitioner

## 2016-08-07 ENCOUNTER — Other Ambulatory Visit: Payer: BLUE CROSS/BLUE SHIELD

## 2016-08-07 VITALS — BP 146/90 | HR 86 | Temp 98.4°F | Ht 65.0 in | Wt 234.0 lb

## 2016-08-07 DIAGNOSIS — I10 Essential (primary) hypertension: Secondary | ICD-10-CM | POA: Diagnosis not present

## 2016-08-07 DIAGNOSIS — Z1211 Encounter for screening for malignant neoplasm of colon: Secondary | ICD-10-CM | POA: Diagnosis not present

## 2016-08-07 DIAGNOSIS — Z0001 Encounter for general adult medical examination with abnormal findings: Secondary | ICD-10-CM | POA: Diagnosis not present

## 2016-08-07 DIAGNOSIS — Z13220 Encounter for screening for lipoid disorders: Secondary | ICD-10-CM

## 2016-08-07 DIAGNOSIS — Z136 Encounter for screening for cardiovascular disorders: Secondary | ICD-10-CM | POA: Diagnosis not present

## 2016-08-07 DIAGNOSIS — Z124 Encounter for screening for malignant neoplasm of cervix: Secondary | ICD-10-CM | POA: Insufficient documentation

## 2016-08-07 DIAGNOSIS — Z114 Encounter for screening for human immunodeficiency virus [HIV]: Secondary | ICD-10-CM

## 2016-08-07 MED ORDER — AMLODIPINE BESYLATE 5 MG PO TABS: 5.0000 mg | ORAL_TABLET | Freq: Every day | ORAL | 1 refills | Status: DC

## 2016-08-07 NOTE — Progress Notes (Signed)
Subjective:    Patient ID: Heidi Costa, female    DOB: 31-Oct-1980, 36 y.o.   MRN: [id number]  Patient presents today for complete physical (new patient)  HPI  HTN:  Home readings 150s/90s-120s/85s. Not consistent with use on amlodipine 2.5mg .  No pcp in 3years  Immunizations: (TDAP, Hep C screen, Pneumovax, Influenza, zoster)  Health Maintenance  Topic Date Due  . Pap Smear  09/09/2000  . Flu Shot  10/28/2016  . Tetanus Vaccine  11/03/2025  . HIV Screening  Completed   Diet:regular.  Weight:  Wt Readings from Last 3 Encounters:  08/07/16 234 lb (106.1 kg)  11/04/15 246 lb (111.6 kg)  09/20/15 245 lb 3.2 oz (111.2 kg)   Exercise:none.  Fall Risk: Fall Risk  08/07/2016 09/20/2015 07/18/2015 07/11/2015  Falls in the past year? No No No No   Home Safety:with husband and son (49yrs old) Depression/Suicide: Depression screen [facility] 2/9 08/07/2016 09/20/2015 07/18/2015 07/11/2015 05/29/2015  Decreased Interest 0 0 0 1 0  Down, Depressed, Hopeless 0 0 0 1 0  PHQ - 2 Score 0 0 0 2 0  Altered sleeping - - - 2 -  Tired, decreased energy - - - 1 -  Change in appetite - - - 1 -  Feeling bad or failure about yourself  - - - 1 -  Trouble concentrating - - - 0 -  Moving slowly or fidgety/restless - - - 0 -  Suicidal thoughts - - - 1 -  PHQ-9 Score - - - 8 -  Difficult doing work/chores - - - Somewhat difficult -   No flowsheet data found.  Pap Smear (every 8yrs for >21-29 without HPV, every 65yrs for >30-86yrs with HPV):needed  Vision:needed.  Dental:needed.  Advanced Directive: Advanced Directives 01/29/2015  Does Patient Have a Medical Advance Directive? No  Would patient like information on creating a medical advance directive? -   Sexual History (birth control, marital status, STD):married and sexually active  Medications and allergies reviewed with patient and updated if appropriate.  Patient Active Problem List   Diagnosis Date Noted  . Disturbance of skin  sensation 05/08/2013  . Cervical spondylosis with myelopathy 05/08/2013  . Abnormality of gait 08/02/2012  . Chronic arthralgias of knees and hips 08/02/2012    Current Outpatient Prescriptions on File Prior to Visit  Medication Sig Dispense Refill  . acetaminophen (TYLENOL) 500 MG tablet Take 500-1,000 mg by mouth daily as needed for pain.     Marland Kitchen albuterol (PROVENTIL HFA;VENTOLIN HFA) 108 (90 Base) MCG/ACT inhaler Inhale into the lungs every 6 (six) hours as needed for wheezing or shortness of breath.    . Triamcinolone Acetonide (TRIAMCINOLONE 0.1 % CREAM : EUCERIN) CREA Apply 1 application topically 2 (two) times daily. (Patient not taking: Reported on 08/07/2016) 1 each 1   No current facility-administered medications on file prior to visit.     Past Medical History:  Diagnosis Date  . Abnormality of gait 08/02/2012  . Anemia    iron taking in the past, not currently  . Anxiety   . Arthritis    spine- cerv.  & arm   . Asthma   . Bulging lumbar disc   . Chronic arthralgias of knees and hips 08/02/2012  . Depression    Pt. took self off Celexa one month ago, states she did n't like how it made her feel .  Marland Kitchen GERD (gastroesophageal reflux disease)   . Heart murmur    pt. been told  that this is a fact, no echo or cardiac surveillance in the past   . Hypertension   . Obesity   . Spinal stenosis     Past Surgical History:  Procedure Laterality Date  . ANTERIOR CERVICAL DECOMP/DISCECTOMY FUSION N/A 10/27/2012   Procedure: ANTERIOR CERVICAL DECOMPRESSION/DISCECTOMY FUSION 1 LEVEL;  Surgeon: Faythe Ghee, MD;  Location: [facility];  Service: Neurosurgery;  Laterality: N/A;  ANTERIOR CERVICAL DECOMPRESSION/DISCECTOMY FUSION 1 LEVEL  . CHALAZION EXCISION     L eye   . VAGINAL DELIVERY  2002    Social History   Social History  . Marital status: Married    Spouse name: N/A  . Number of children: 1  . Years of education: associates   Occupational History  .  Other   Social  History Main Topics  . Smoking status: Current Every Day Smoker    Packs/day: 1.00    Years: 10.00    Types: Cigarettes  . Smokeless tobacco: Never Used  . Alcohol use Yes     Comment: occas  . Drug use: Yes    Types: Marijuana     Comment: occ  . Sexual activity: Yes    Birth control/ protection: None   Other Topics Concern  . None   Social History Narrative   Patient is married with one child.   Patient is right handed.   Patient has a Associate's degree.   Patient drinks 2-3 cups daily.    Family History  Problem Relation Age of Onset  . Osteoarthritis Mother   . Hypertension Mother   . Diabetes Mother   . Hypertension Father   . Gout Father   . Stroke Father 26       embolic  . Diabetes Brother   . Hypertension Brother   . Heart failure Brother 110  . Kidney disease Brother   . Diabetes Paternal Grandfather   . Cancer Paternal Grandfather 71       breast cancer        Review of Systems  Constitutional: Negative for fever, malaise/fatigue and weight loss.  HENT: Negative for congestion and sore throat.   Eyes:       Negative for visual changes  Respiratory: Negative for cough and shortness of breath.   Cardiovascular: Negative for chest pain, palpitations and leg swelling.  Gastrointestinal: Negative for blood in stool, constipation, diarrhea and heartburn.  Genitourinary: Negative for dysuria, frequency and urgency.  Musculoskeletal: Negative for falls, joint pain and myalgias.  Skin: Negative for rash.  Neurological: Negative for dizziness, sensory change and headaches.  Endo/Heme/Allergies: Does not bruise/bleed easily.  Psychiatric/Behavioral: Negative for depression, substance abuse and suicidal ideas. The patient is not nervous/anxious.     Objective:   Vitals:   08/07/16 1505  BP: (!) 146/90  Pulse: 86  Temp: 98.4 F (36.9 C)    Body mass index is 38.94 kg/m.   Physical Examination:  Physical Exam  Constitutional: She is oriented  to person, place, and time and well-developed, well-nourished, and in no distress. No distress.  HENT:  Right Ear: External ear normal.  Left Ear: External ear normal.  Nose: Nose normal.  Mouth/Throat: No oropharyngeal exudate.  Eyes: Conjunctivae and EOM are normal. Pupils are equal, round, and reactive to light. No scleral icterus.  Neck: Normal range of motion. Neck supple. No thyromegaly present.  Cardiovascular: Normal rate, regular rhythm, normal heart sounds and intact distal pulses.   Pulmonary/Chest: Effort normal and breath sounds normal. Right  breast exhibits no inverted nipple, no mass, no nipple discharge, no skin change and no tenderness. Left breast exhibits no inverted nipple, no mass, no nipple discharge, no skin change and no tenderness. Breasts are symmetrical.  Abdominal: Soft. Bowel sounds are normal. She exhibits no distension. There is no tenderness.  Genitourinary: Rectum normal, vagina normal, cervix normal, right adnexa normal, left adnexa normal and vulva normal. Cervix exhibits no motion tenderness. No vaginal discharge found.  Musculoskeletal: Normal range of motion. She exhibits no edema or tenderness.  Lymphadenopathy:    She has no cervical adenopathy.  Neurological: She is alert and oriented to person, place, and time. Gait normal.  Skin: Skin is warm and dry.  Psychiatric: Affect and judgment normal.  Vitals reviewed.   ASSESSMENT and PLAN:  Gerianne was seen today for establish care.  Diagnoses and all orders for this visit:  Encounter for preventative adult health care exam with abnormal findings -     amLODipine (NORVASC) 5 MG tablet; Take 1 tablet (5 mg total) by mouth at bedtime. -     Comprehensive metabolic panel; Future -     TSH; Future -     Urinalysis, Routine w reflex microscopic; Future -     Cytology - PAP -     IFOBT POC (occult bld, rslt in office); Future  Colon cancer screening -     IFOBT POC (occult bld, rslt in office);  Future  Encounter for Papanicolaou smear for cervical cancer screening -     Cytology - PAP  Essential hypertension -     amLODipine (NORVASC) 5 MG tablet; Take 1 tablet (5 mg total) by mouth at bedtime.  Encounter for lipid screening for cardiovascular disease -     Lipid panel; Future  Encounter for screening for human immunodeficiency virus (HIV) -     HIV antibody; Future    No problem-specific Assessment & Plan notes found for this encounter.      Follow up: Return in about 3 months (around 11/07/2016) for HTN.  Wilfred Lacy, NP

## 2016-08-07 NOTE — Patient Instructions (Signed)
Go to basement for urine collection and blood draw. You need to be fasting at least 6-8hrs prior to blood draw).  Take BP medication daily.  Health Maintenance, Female Adopting a healthy lifestyle and getting preventive care can go a long way to promote health and wellness. Talk with your health care provider about what schedule of regular examinations is right for you. This is a good chance for you to check in with your provider about disease prevention and staying healthy. In between checkups, there are plenty of things you can do on your own. Experts have done a lot of research about which lifestyle changes and preventive measures are most likely to keep you healthy. Ask your health care provider for more information. Weight and diet Eat a healthy diet  Be sure to include plenty of vegetables, fruits, low-fat dairy products, and lean protein.  Do not eat a lot of foods high in solid fats, added sugars, or salt.  Get regular exercise. This is one of the most important things you can do for your health.  Most adults should exercise for at least 150 minutes each week. The exercise should increase your heart rate and make you sweat (moderate-intensity exercise).  Most adults should also do strengthening exercises at least twice a week. This is in addition to the moderate-intensity exercise. Maintain a healthy weight  Body mass index (BMI) is a measurement that can be used to identify possible weight problems. It estimates body fat based on height and weight. Your health care provider can help determine your BMI and help you achieve or maintain a healthy weight.  For females 53 years of age and older:  A BMI below 18.5 is considered underweight.  A BMI of 18.5 to 24.9 is normal.  A BMI of 25 to 29.9 is considered overweight.  A BMI of 30 and above is considered obese. Watch levels of cholesterol and blood lipids  You should start having your blood tested for lipids and cholesterol  at 37 years of age, then have this test every 5 years.  You may need to have your cholesterol levels checked more often if:  Your lipid or cholesterol levels are high.  You are older than 37 years of age.  You are at high risk for heart disease. Cancer screening Lung Cancer  Lung cancer screening is recommended for adults 30-22 years old who are at high risk for lung cancer because of a history of smoking.  A yearly low-dose CT scan of the lungs is recommended for people who:  Currently smoke.  Have quit within the past 15 years.  Have at least a 30-pack-year history of smoking. A pack year is smoking an average of one pack of cigarettes a day for 1 year.  Yearly screening should continue until it has been 15 years since you quit.  Yearly screening should stop if you develop a health problem that would prevent you from having lung cancer treatment. Breast Cancer  Practice breast self-awareness. This means understanding how your breasts normally appear and feel.  It also means doing regular breast self-exams. Let your health care provider know about any changes, no matter how small.  If you are in your 20s or 30s, you should have a clinical breast exam (CBE) by a health care provider every 1-3 years as part of a regular health exam.  If you are 45 or older, have a CBE every year. Also consider having a breast X-ray (mammogram) every year.  If you  have a family history of breast cancer, talk to your health care provider about genetic screening.  If you are at high risk for breast cancer, talk to your health care provider about having an MRI and a mammogram every year.  Breast cancer gene (BRCA) assessment is recommended for women who have family members with BRCA-related cancers. BRCA-related cancers include:  Breast.  Ovarian.  Tubal.  Peritoneal cancers.  Results of the assessment will determine the need for genetic counseling and BRCA1 and BRCA2 testing. Cervical  Cancer  Your health care provider may recommend that you be screened regularly for cancer of the pelvic organs (ovaries, uterus, and vagina). This screening involves a pelvic examination, including checking for microscopic changes to the surface of your cervix (Pap test). You may be encouraged to have this screening done every 3 years, beginning at age 9.  For women ages 35-65, health care providers may recommend pelvic exams and Pap testing every 3 years, or they may recommend the Pap and pelvic exam, combined with testing for human papilloma virus (HPV), every 5 years. Some types of HPV increase your risk of cervical cancer. Testing for HPV may also be done on women of any age with unclear Pap test results.  Other health care providers may not recommend any screening for nonpregnant women who are considered low risk for pelvic cancer and who do not have symptoms. Ask your health care provider if a screening pelvic exam is right for you.  If you have had past treatment for cervical cancer or a condition that could lead to cancer, you need Pap tests and screening for cancer for at least 20 years after your treatment. If Pap tests have been discontinued, your risk factors (such as having a new sexual partner) need to be reassessed to determine if screening should resume. Some women have medical problems that increase the chance of getting cervical cancer. In these cases, your health care provider may recommend more frequent screening and Pap tests. Colorectal Cancer  This type of cancer can be detected and often prevented.  Routine colorectal cancer screening usually begins at 37 years of age and continues through 37 years of age.  Your health care provider may recommend screening at an earlier age if you have risk factors for colon cancer.  Your health care provider may also recommend using home test kits to check for hidden blood in the stool.  A small camera at the end of a tube can be used to  examine your colon directly (sigmoidoscopy or colonoscopy). This is done to check for the earliest forms of colorectal cancer.  Routine screening usually begins at age 48.  Direct examination of the colon should be repeated every 5-10 years through 37 years of age. However, you may need to be screened more often if early forms of precancerous polyps or small growths are found. Skin Cancer  Check your skin from head to toe regularly.  Tell your health care provider about any new moles or changes in moles, especially if there is a change in a mole's shape or color.  Also tell your health care provider if you have a mole that is larger than the size of a pencil eraser.  Always use sunscreen. Apply sunscreen liberally and repeatedly throughout the day.  Protect yourself by wearing long sleeves, pants, a wide-brimmed hat, and sunglasses whenever you are outside. Heart disease, diabetes, and high blood pressure  High blood pressure causes heart disease and increases the risk of stroke.  High blood pressure is more likely to develop in:  People who have blood pressure in the high end of the normal range (130-139/85-89 mm Hg).  People who are overweight or obese.  People who are African American.  If you are 15-45 years of age, have your blood pressure checked every 3-5 years. If you are 75 years of age or older, have your blood pressure checked every year. You should have your blood pressure measured twice-once when you are at a hospital or clinic, and once when you are not at a hospital or clinic. Record the average of the two measurements. To check your blood pressure when you are not at a hospital or clinic, you can use:  An automated blood pressure machine at a pharmacy.  A home blood pressure monitor.  If you are between 75 years and 66 years old, ask your health care provider if you should take aspirin to prevent strokes.  Have regular diabetes screenings. This involves taking a blood  sample to check your fasting blood sugar level.  If you are at a normal weight and have a low risk for diabetes, have this test once every three years after 37 years of age.  If you are overweight and have a high risk for diabetes, consider being tested at a younger age or more often. Preventing infection Hepatitis B  If you have a higher risk for hepatitis B, you should be screened for this virus. You are considered at high risk for hepatitis B if:  You were born in a country where hepatitis B is common. Ask your health care provider which countries are considered high risk.  Your parents were born in a high-risk country, and you have not been immunized against hepatitis B (hepatitis B vaccine).  You have HIV or AIDS.  You use needles to inject street drugs.  You live with someone who has hepatitis B.  You have had sex with someone who has hepatitis B.  You get hemodialysis treatment.  You take certain medicines for conditions, including cancer, organ transplantation, and autoimmune conditions. Hepatitis C  Blood testing is recommended for:  Everyone born from [birth year] through 1966.  Anyone with known risk factors for hepatitis C. Sexually transmitted infections (STIs)  You should be screened for sexually transmitted infections (STIs) including gonorrhea and chlamydia if:  You are sexually active and are younger than 37 years of age.  You are older than 37 years of age and your health care provider tells you that you are at risk for this type of infection.  Your sexual activity has changed since you were last screened and you are at an increased risk for chlamydia or gonorrhea. Ask your health care provider if you are at risk.  If you do not have HIV, but are at risk, it may be recommended that you take a prescription medicine daily to prevent HIV infection. This is called pre-exposure prophylaxis (PrEP). You are considered at risk if:  You are sexually active and do not  regularly use condoms or know the HIV status of your partner(s).  You take drugs by injection.  You are sexually active with a partner who has HIV. Talk with your health care provider about whether you are at high risk of being infected with HIV. If you choose to begin PrEP, you should first be tested for HIV. You should then be tested every 3 months for as long as you are taking PrEP. Pregnancy  If you are premenopausal and  you may become pregnant, ask your health care provider about preconception counseling.  If you may become pregnant, take 400 to 800 micrograms (mcg) of folic acid every day.  If you want to prevent pregnancy, talk to your health care provider about birth control (contraception). Osteoporosis and menopause  Osteoporosis is a disease in which the bones lose minerals and strength with aging. This can result in serious bone fractures. Your risk for osteoporosis can be identified using a bone density scan.  If you are 30 years of age or older, or if you are at risk for osteoporosis and fractures, ask your health care provider if you should be screened.  Ask your health care provider whether you should take a calcium or vitamin D supplement to lower your risk for osteoporosis.  Menopause may have certain physical symptoms and risks.  Hormone replacement therapy may reduce some of these symptoms and risks. Talk to your health care provider about whether hormone replacement therapy is right for you. Follow these instructions at home:  Schedule regular health, dental, and eye exams.  Stay current with your immunizations.  Do not use any tobacco products including cigarettes, chewing tobacco, or electronic cigarettes.  If you are pregnant, do not drink alcohol.  If you are breastfeeding, limit how much and how often you drink alcohol.  Limit alcohol intake to no more than 1 drink per day for nonpregnant women. One drink equals 12 ounces of beer, 5 ounces of wine, or 1  ounces of hard liquor.  Do not use street drugs.  Do not share needles.  Ask your health care provider for help if you need support or information about quitting drugs.  Tell your health care provider if you often feel depressed.  Tell your health care provider if you have ever been abused or do not feel safe at home. This information is not intended to replace advice given to you by your health care provider. Make sure you discuss any questions you have with your health care provider. Document Released: 09/29/2010 Document Revised: 08/22/2015 Document Reviewed: 12/18/2014 Elsevier Interactive Patient Education  2017 [employer].

## 2016-08-07 NOTE — Progress Notes (Signed)
Pre visit review using our clinic review tool, if applicable. No additional management support is needed unless otherwise documented below in the visit note. 

## 2016-08-10 ENCOUNTER — Other Ambulatory Visit (INDEPENDENT_AMBULATORY_CARE_PROVIDER_SITE_OTHER): Payer: BLUE CROSS/BLUE SHIELD

## 2016-08-10 DIAGNOSIS — Z136 Encounter for screening for cardiovascular disorders: Secondary | ICD-10-CM | POA: Diagnosis not present

## 2016-08-10 DIAGNOSIS — Z0001 Encounter for general adult medical examination with abnormal findings: Secondary | ICD-10-CM | POA: Diagnosis not present

## 2016-08-10 DIAGNOSIS — Z13220 Encounter for screening for lipoid disorders: Secondary | ICD-10-CM | POA: Diagnosis not present

## 2016-08-10 DIAGNOSIS — Z114 Encounter for screening for human immunodeficiency virus [HIV]: Secondary | ICD-10-CM

## 2016-08-10 DIAGNOSIS — N3001 Acute cystitis with hematuria: Secondary | ICD-10-CM

## 2016-08-10 DIAGNOSIS — I10 Essential (primary) hypertension: Secondary | ICD-10-CM | POA: Insufficient documentation

## 2016-08-10 LAB — URINALYSIS, ROUTINE W REFLEX MICROSCOPIC
Bilirubin Urine: NEGATIVE
Ketones, ur: NEGATIVE
LEUKOCYTES UA: NEGATIVE
Nitrite: NEGATIVE
PH: 5.5 (ref 5.0–8.0)
TOTAL PROTEIN, URINE-UPE24: NEGATIVE
URINE GLUCOSE: NEGATIVE
UROBILINOGEN UA: 0.2 (ref 0.0–1.0)
WBC, UA: NONE SEEN (ref 0–?)

## 2016-08-10 LAB — COMPREHENSIVE METABOLIC PANEL
ALK PHOS: 46 U/L (ref 39–117)
ALT: 12 U/L (ref 0–35)
AST: 15 U/L (ref 0–37)
Albumin: 4 g/dL (ref 3.5–5.2)
BUN: 8 mg/dL (ref 6–23)
CO2: 26 mEq/L (ref 19–32)
Calcium: 9.1 mg/dL (ref 8.4–10.5)
Chloride: 107 mEq/L (ref 96–112)
Creatinine, Ser: 0.77 mg/dL (ref 0.40–1.20)
GFR: 108.53 mL/min (ref >60.00)
Glucose, Bld: 97 mg/dL (ref 70–99)
POTASSIUM: 4.4 meq/L (ref 3.5–5.1)
Sodium: 138 mEq/L (ref 135–145)
TOTAL PROTEIN: 6.7 g/dL (ref 6.0–8.3)
Total Bilirubin: 0.2 mg/dL (ref 0.2–1.2)

## 2016-08-10 LAB — LIPID PANEL
CHOL/HDL RATIO: 4
Cholesterol: 163 mg/dL (ref 0–200)
HDL: 36.7 mg/dL — AB (ref >39.00)
LDL Cholesterol: 111 mg/dL — ABNORMAL HIGH (ref 0–99)
NonHDL: 126.17
Triglycerides: 74 mg/dL (ref 0.0–149.0)
VLDL: 14.8 mg/dL (ref 0.0–40.0)

## 2016-08-10 LAB — TSH: TSH: 1.01 u[IU]/mL (ref 0.35–4.50)

## 2016-08-10 MED ORDER — NITROFURANTOIN MONOHYD MACRO 100 MG PO CAPS: 100.0000 mg | ORAL_CAPSULE | Freq: Two times a day (BID) | ORAL | 0 refills | Status: DC

## 2016-08-11 LAB — CYTOLOGY - PAP
Diagnosis: NEGATIVE
HPV: NOT DETECTED

## 2016-08-11 LAB — HIV ANTIBODY (ROUTINE TESTING W REFLEX): HIV: NONREACTIVE

## 2016-10-19 ENCOUNTER — Ambulatory Visit (INDEPENDENT_AMBULATORY_CARE_PROVIDER_SITE_OTHER): Payer: Worker's Compensation | Admitting: Emergency Medicine

## 2016-10-19 ENCOUNTER — Encounter: Payer: Self-pay | Admitting: Emergency Medicine

## 2016-10-19 VITALS — BP 154/80 | HR 54 | Temp 98.0°F | Resp 18 | Ht 66.14 in | Wt 236.0 lb

## 2016-10-19 DIAGNOSIS — W57XXXA Bitten or stung by nonvenomous insect and other nonvenomous arthropods, initial encounter: Secondary | ICD-10-CM | POA: Diagnosis not present

## 2016-10-19 DIAGNOSIS — H02846 Edema of left eye, unspecified eyelid: Secondary | ICD-10-CM

## 2016-10-19 NOTE — Progress Notes (Signed)
Heidi Costa 36 y.o.   Chief Complaint  Patient presents with  . Insect Bite    under left eye     HISTORY OF PRESENT ILLNESS: This is a 36 y.o. female complaining of left eyelid swelling and pain after insect bite this am. No other symptomatology.  HPI   Prior to Admission medications   Medication Sig Start Date End Date Taking? Authorizing Provider  acetaminophen (TYLENOL) 500 MG tablet Take 500-1,000 mg by mouth daily as needed for pain.    Yes [provider]  albuterol (PROVENTIL HFA;VENTOLIN HFA) 108 (90 Base) MCG/ACT inhaler Inhale into the lungs every 6 (six) hours as needed for wheezing or shortness of breath.   Yes [provider]  amLODipine (NORVASC) 5 MG tablet Take 1 tablet (5 mg total) by mouth at bedtime. 08/07/16  Yes Nche, Charlene Brooke, NP  nitrofurantoin, macrocrystal-monohydrate, (MACROBID) 100 MG capsule Take 1 capsule (100 mg total) by mouth 2 (two) times daily. 08/10/16  Yes Nche, Charlene Brooke, NP  Triamcinolone Acetonide (TRIAMCINOLONE 0.1 % CREAM : EUCERIN) CREA Apply 1 application topically 2 (two) times daily. 09/20/15  Yes Jaynee Eagles, PA-C  triamcinolone cream (KENALOG) 0.5 % Apply 0.5 application topically. 07/20/16  Yes [provider]    Allergies  Allergen Reactions  . Aspirin Rash    Patient Active Problem List   Diagnosis Date Noted  . Essential hypertension 08/10/2016  . Disturbance of skin sensation 05/08/2013  . Cervical spondylosis with myelopathy 05/08/2013  . Abnormality of gait 08/02/2012  . Chronic arthralgias of knees and hips 08/02/2012    Past Medical History:  Diagnosis Date  . Abnormality of gait 08/02/2012  . Anemia    iron taking in the past, not currently  . Anxiety   . Arthritis    spine- cerv.  & arm   . Asthma   . Bulging lumbar disc   . Chronic arthralgias of knees and hips 08/02/2012  . Depression    Pt. took self off Celexa one month ago, states she did n't like how it made her feel  .  Marland Kitchen GERD (gastroesophageal reflux disease)   . Heart murmur    pt. been told that this is a fact, no echo or cardiac surveillance in the past   . Hypertension   . Obesity   . Spinal stenosis     Past Surgical History:  Procedure Laterality Date  . ANTERIOR CERVICAL DECOMP/DISCECTOMY FUSION N/A 10/27/2012   Procedure: ANTERIOR CERVICAL DECOMPRESSION/DISCECTOMY FUSION 1 LEVEL;  Surgeon: Faythe Ghee, MD;  Location: [facility];  Service: Neurosurgery;  Laterality: N/A;  ANTERIOR CERVICAL DECOMPRESSION/DISCECTOMY FUSION 1 LEVEL  . CHALAZION EXCISION     L eye   . VAGINAL DELIVERY  2002    Social History   Social History  . Marital status: Married    Spouse name: N/A  . Number of children: 1  . Years of education: associates   Occupational History  .  Other   Social History Main Topics  . Smoking status: Current Every Day Smoker    Packs/day: 1.00    Years: 10.00    Types: Cigarettes  . Smokeless tobacco: Never Used  . Alcohol use Yes     Comment: occas  . Drug use: Yes    Types: Marijuana     Comment: occ  . Sexual activity: Yes    Birth control/ protection: None   Other Topics Concern  . Not on file   Social  History Narrative   Patient is married with one child.   Patient is right handed.   Patient has a Associate's degree.   Patient drinks 2-3 cups daily.    Family History  Problem Relation Age of Onset  . Osteoarthritis Mother   . Hypertension Mother   . Diabetes Mother   . Hypertension Father   . Gout Father   . Stroke Father 44       embolic  . Diabetes Brother   . Hypertension Brother   . Heart failure Brother 40  . Kidney disease Brother   . Diabetes Paternal Grandfather   . Cancer Paternal Grandfather 77       breast cancer     Review of Systems  Constitutional: Negative.  Negative for chills and fever.  Eyes: Negative for blurred vision, double vision, pain, discharge and redness.  Respiratory: Negative for cough and shortness of  breath.   Cardiovascular: Negative for chest pain and palpitations.  Gastrointestinal: Negative for nausea and vomiting.  Skin: Negative.  Negative for rash.  Neurological: Negative for dizziness and headaches.  All other systems reviewed and are negative.  Vitals:   10/19/16 1058 10/19/16 1059  BP: (!) 171/107 (!) 154/80  Pulse: (!) 54   Resp: 18   Temp: 98 F (36.7 C)      Physical Exam  Constitutional: She is oriented to person, place, and time. She appears well-developed and well-nourished.  HENT:  Head: Normocephalic and atraumatic.  Eyes: Pupils are equal, round, and reactive to light. Conjunctivae and EOM are normal.  Left lower eyelid with mild edema; o/w WNL.  Neck: Normal range of motion. Neck supple.  Cardiovascular: Normal rate.   Pulmonary/Chest: Effort normal.  Musculoskeletal: Normal range of motion.  Neurological: She is alert and oriented to person, place, and time.  Skin: Skin is warm and dry. Capillary refill takes less than 2 seconds. No rash noted.  Psychiatric: She has a normal mood and affect. Her behavior is normal.     ASSESSMENT & PLAN: Keryn was seen today for insect bite.  Diagnoses and all orders for this visit:  Swelling of eyelid, left  Insect bite, initial encounter Comments: left eyelid    Patient Instructions       IF you received an x-ray today, you will receive an invoice from [facility]. Please contact [facility] at [phone] with questions or concerns regarding your invoice.   IF you received labwork today, you will receive an invoice from [facility]. Please contact [facility] at [phone] with questions or concerns regarding your invoice.   Our billing staff will not be able to assist you with questions regarding bills from these companies.  You will be contacted with the lab results as soon as they are available. The fastest way to get your results is to activate your My Chart account.  Instructions are located on the last page of this paperwork. If you have not heard from [place] regarding the results in 2 weeks, please contact this office.      Insect Bite, Adult An insect bite can make your skin red, itchy, and swollen. Some insects can spread disease to people with a bite. However, most insect bites do not lead to disease, and most are not serious. Follow these instructions at home: Bite area care  Do not scratch the bite area.  Keep the bite area clean and dry.  Wash the bite area every day with soap and water as told by your doctor.  Check the bite area every day for signs of infection. Check for: ? More redness, swelling, or pain. ? Fluid or blood. ? Warmth. ? Pus. Managing pain, itching, and swelling  You may put any of these on the bite area as told by your doctor: ? A baking soda paste. ? Cortisone cream. ? Calamine lotion.  If directed, put ice on the bite area. ? Put ice in a plastic bag. ? Place a towel between your skin and the bag. ? Leave the ice on for 20 minutes, 2-3 times a day. Medicines  Take medicines or put medicines on your skin only as told by your doctor.  If you were prescribed an antibiotic medicine, use it as told by your doctor. Do not stop using the antibiotic even if your condition improves. General instructions  Keep all follow-up visits as told by your doctor. This is important. How is this prevented? To help you have a lower risk of insect bites:  When you are outside, wear clothing that covers your arms and legs.  Use insect repellent. The best insect repellents have: ? An active ingredient of DEET, picaridin, oil of lemon eucalyptus (OLE), or IR3535. ? Higher amounts of DEET or another active ingredient than other repellents have.  If your home windows do not have screens, think about putting some in.  Contact a doctor if:  You have more redness, swelling, or pain in the bite area.  You have fluid, blood, or pus  coming from the bite area.  The bite area feels warm.  You have a fever. Get help right away if:  You have joint pain.  You have a rash.  You have shortness of breath.  You feel more tired or sleepy than you normally do.  You have neck pain.  You have a headache.  You feel weaker than you normally do.  You have chest pain.  You have pain in your belly.  You feel sick to your stomach (nauseous) or you throw up (vomit). Summary  An insect bite can make your skin red, itchy, and swollen.  Do not scratch the bite area, and keep it clean and dry.  Ice can help with pain and itching from the bite. This information is not intended to replace advice given to you by your health care provider. Make sure you discuss any questions you have with your health care provider. Document Released: 03/13/2000 Document Revised: 10/17/2015 Document Reviewed: 08/01/2014 Elsevier Interactive Patient Education  2018 Elsevier Inc.      Agustina Caroli, MD [facility]

## 2016-10-19 NOTE — Patient Instructions (Addendum)
   IF you received an x-ray today, you will receive an invoice from [facility]. Please contact [facility] at [phone] with questions or concerns regarding your invoice.   IF you received labwork today, you will receive an invoice from [facility]. Please contact [facility] at [phone] with questions or concerns regarding your invoice.   Our billing staff will not be able to assist you with questions regarding bills from these companies.  You will be contacted with the lab results as soon as they are available. The fastest way to get your results is to activate your My Chart account. Instructions are located on the last page of this paperwork. If you have not heard from us regarding the results in 2 weeks, please contact this office.      Insect Bite, Adult An insect bite can make your skin red, itchy, and swollen. Some insects can spread disease to people with a bite. However, most insect bites do not lead to disease, and most are not serious. Follow these instructions at home: Bite area care  Do not scratch the bite area.  Keep the bite area clean and dry.  Wash the bite area every day with soap and water as told by your doctor.  Check the bite area every day for signs of infection. Check for: ? More redness, swelling, or pain. ? Fluid or blood. ? Warmth. ? Pus. Managing pain, itching, and swelling  You may put any of these on the bite area as told by your doctor: ? A baking soda paste. ? Cortisone cream. ? Calamine lotion.  If directed, put ice on the bite area. ? Put ice in a plastic bag. ? Place a towel between your skin and the bag. ? Leave the ice on for 20 minutes, 2-3 times a day. Medicines  Take medicines or put medicines on your skin only as told by your doctor.  If you were prescribed an antibiotic medicine, use it as told by your doctor. Do not stop using the antibiotic even if your condition improves. General instructions  Keep all  follow-up visits as told by your doctor. This is important. How is this prevented? To help you have a lower risk of insect bites:  When you are outside, wear clothing that covers your arms and legs.  Use insect repellent. The best insect repellents have: ? An active ingredient of DEET, picaridin, oil of lemon eucalyptus (OLE), or IR3535. ? Higher amounts of DEET or another active ingredient than other repellents have.  If your home windows do not have screens, think about putting some in.  Contact a doctor if:  You have more redness, swelling, or pain in the bite area.  You have fluid, blood, or pus coming from the bite area.  The bite area feels warm.  You have a fever. Get help right away if:  You have joint pain.  You have a rash.  You have shortness of breath.  You feel more tired or sleepy than you normally do.  You have neck pain.  You have a headache.  You feel weaker than you normally do.  You have chest pain.  You have pain in your belly.  You feel sick to your stomach (nauseous) or you throw up (vomit). Summary  An insect bite can make your skin red, itchy, and swollen.  Do not scratch the bite area, and keep it clean and dry.  Ice can help with pain and itching from the bite. This information is   not intended to replace advice given to you by your health care provider. Make sure you discuss any questions you have with your health care provider. Document Released: 03/13/2000 Document Revised: 10/17/2015 Document Reviewed: 08/01/2014 Elsevier Interactive Patient Education  2018 Elsevier Inc.  

## 2016-10-19 NOTE — Progress Notes (Signed)
[facility]

## 2016-11-09 ENCOUNTER — Ambulatory Visit: Payer: BLUE CROSS/BLUE SHIELD | Admitting: Nurse Practitioner

## 2017-02-22 ENCOUNTER — Encounter: Payer: Self-pay | Admitting: Nurse Practitioner

## 2017-02-22 ENCOUNTER — Ambulatory Visit: Payer: BLUE CROSS/BLUE SHIELD | Admitting: Nurse Practitioner

## 2017-02-22 VITALS — BP 138/80 | HR 92 | Temp 97.7°F | Ht 66.14 in | Wt 243.5 lb

## 2017-02-22 DIAGNOSIS — G8929 Other chronic pain: Secondary | ICD-10-CM | POA: Diagnosis not present

## 2017-02-22 DIAGNOSIS — F419 Anxiety disorder, unspecified: Secondary | ICD-10-CM | POA: Diagnosis not present

## 2017-02-22 DIAGNOSIS — M545 Low back pain: Secondary | ICD-10-CM | POA: Diagnosis not present

## 2017-02-22 MED ORDER — METHOCARBAMOL 500 MG PO TABS: 500.0000 mg | ORAL_TABLET | Freq: Three times a day (TID) | ORAL | 0 refills | Status: DC | PRN

## 2017-02-22 MED ORDER — IBUPROFEN 800 MG PO TABS: 800.0000 mg | ORAL_TABLET | Freq: Three times a day (TID) | ORAL | 0 refills | Status: DC | PRN

## 2017-02-22 MED ORDER — BUSPIRONE HCL 7.5 MG PO TABS: 7.5000 mg | ORAL_TABLET | Freq: Three times a day (TID) | ORAL | 0 refills | Status: DC

## 2017-02-22 NOTE — Progress Notes (Signed)
Subjective:  Patient ID: Heidi Costa, female    DOB: March 13, 1981  Age: 36 y.o. MRN: [id number]  CC: Back Pain (started hurting bad on saturday. patient was told a few years ago that she had bulging discs, pain is ongoing but became worse on saturday.)   Anxiety  Presents for initial visit. Onset was at an unknown time. The problem has been waxing and waning. Symptoms include decreased concentration, excessive worry, irritability, nervous/anxious behavior and restlessness. Patient reports no chest pain or suicidal ideas. The symptoms are aggravated by work stress, social activities and family issues. The quality of sleep is fair.   Her past medical history is significant for anxiety/panic attacks. There is no history of suicide attempts. Past treatments include non-benzodiazephine anxiolytics. Compliance with prior treatments has been good.  Back Pain  This is a chronic problem. The current episode started more than 1 year ago. The problem occurs intermittently. The problem has been waxing and waning since onset. The pain is present in the lumbar spine. The quality of the pain is described as aching and cramping. The pain does not radiate. The pain is the same all the time. The symptoms are aggravated by bending and lying down. Pertinent negatives include no bladder incontinence, bowel incontinence, chest pain, dysuria, fever, headaches, leg pain, numbness, paresis, pelvic pain, tingling, weakness or weight loss. Risk factors include lack of exercise, obesity, poor posture and sedentary lifestyle. She has tried NSAIDs and muscle relaxant for the symptoms. The treatment provided significant relief.    Back pain: Acute on chronic No improvement with tylenol and aleve. Denies any recent injury.  Outpatient Medications Prior to Visit  Medication Sig Dispense Refill  . acetaminophen (TYLENOL) 500 MG tablet Take 500-1,000 mg by mouth daily as needed for pain.     Marland Kitchen amLODipine (NORVASC) 5 MG  tablet Take 1 tablet (5 mg total) by mouth at bedtime. 90 tablet 1  . albuterol (PROVENTIL HFA;VENTOLIN HFA) 108 (90 Base) MCG/ACT inhaler Inhale into the lungs every 6 (six) hours as needed for wheezing or shortness of breath.    . nitrofurantoin, macrocrystal-monohydrate, (MACROBID) 100 MG capsule Take 1 capsule (100 mg total) by mouth 2 (two) times daily. 14 capsule 0  . Triamcinolone Acetonide (TRIAMCINOLONE 0.1 % CREAM : EUCERIN) CREA Apply 1 application topically 2 (two) times daily. 1 each 1  . triamcinolone cream (KENALOG) 0.5 % Apply 0.5 application topically.  0   No facility-administered medications prior to visit.     ROS See HPI  Objective:  BP 138/80 (BP Location: Right Arm, Patient Position: Sitting, Cuff Size: Large)   Pulse 92   Temp 97.7 F (36.5 C) (Oral)   Ht 5' 6.14" (1.68 m)   Wt 243 lb 8 oz (110.5 kg)   SpO2 99%   BMI 39.14 kg/m   BP Readings from Last 3 Encounters:  02/22/17 138/80  10/19/16 (!) 154/80  08/07/16 (!) 146/90    Wt Readings from Last 3 Encounters:  02/22/17 243 lb 8 oz (110.5 kg)  10/19/16 236 lb (107 kg)  08/07/16 234 lb (106.1 kg)    Physical Exam  Constitutional: She is oriented to person, place, and time.  Cardiovascular: Normal rate.  Pulmonary/Chest: Effort normal.  Abdominal: Soft. She exhibits no distension. There is no tenderness.  Musculoskeletal: She exhibits tenderness. She exhibits no edema.       Right hip: Normal.       Left hip: Normal.       Right  knee: Normal.       Left knee: Normal.       Lumbar back: She exhibits tenderness. She exhibits normal range of motion and no bony tenderness.       Right upper leg: Normal.       Left upper leg: Normal.  Neurological: She is alert and oriented to person, place, and time.  Vitals reviewed.   Lab Results  Component Value Date   WBC 7.9 07/11/2015   HGB 13.2 07/11/2015   HCT 37.6 (A) 07/11/2015   PLT 305 10/25/2012   GLUCOSE 97 08/10/2016   CHOL 163 08/10/2016    TRIG 74.0 08/10/2016   HDL 36.70 (L) 08/10/2016   LDLCALC 111 (H) 08/10/2016   ALT 12 08/10/2016   AST 15 08/10/2016   NA 138 08/10/2016   K 4.4 08/10/2016   CL 107 08/10/2016   CREATININE 0.77 08/10/2016   BUN 8 08/10/2016   CO2 26 08/10/2016   TSH 1.01 08/10/2016   HGBA1C 5.8 05/29/2015    No results found.  Assessment & Plan:   Heidi Costa was seen today for back pain.  Diagnoses and all orders for this visit:  Chronic midline low back pain without sciatica -     methocarbamol (ROBAXIN) 500 MG tablet; Take 1 tablet (500 mg total) by mouth every 8 (eight) hours as needed for muscle spasms. -     ibuprofen (ADVIL,MOTRIN) 800 MG tablet; Take 1 tablet (800 mg total) by mouth every 8 (eight) hours as needed.  Anxiety -     busPIRone (BUSPAR) 7.5 MG tablet; Take 1 tablet (7.5 mg total) by mouth 3 (three) times daily.   I have discontinued Heidi Costa's albuterol, triamcinolone 0.1 % cream : eucerin, triamcinolone cream, and nitrofurantoin (macrocrystal-monohydrate). I am also having her start on methocarbamol, ibuprofen, and busPIRone. Additionally, I am having her maintain her acetaminophen and amLODipine.  Meds ordered this encounter  Medications  . methocarbamol (ROBAXIN) 500 MG tablet    Sig: Take 1 tablet (500 mg total) by mouth every 8 (eight) hours as needed for muscle spasms.    Dispense:  20 tablet    Refill:  0    Order Specific Question:   Supervising Provider    Answer:   Lucille Passy [[id number]]  . ibuprofen (ADVIL,MOTRIN) 800 MG tablet    Sig: Take 1 tablet (800 mg total) by mouth every 8 (eight) hours as needed.    Dispense:  30 tablet    Refill:  0    Order Specific Question:   Supervising Provider    Answer:   Lucille Passy [[id number]]  . busPIRone (BUSPAR) 7.5 MG tablet    Sig: Take 1 tablet (7.5 mg total) by mouth 3 (three) times daily.    Dispense:  30 tablet    Refill:  0    Order Specific Question:   Supervising Provider    Answer:   Lucille Passy  [[id number]]    Follow-up: Return in about 2 weeks (around 03/08/2017) for anxiety.  Wilfred Lacy, NP

## 2017-02-22 NOTE — Patient Instructions (Signed)

## 2017-02-23 ENCOUNTER — Encounter: Payer: Self-pay | Admitting: Nurse Practitioner

## 2017-03-08 ENCOUNTER — Ambulatory Visit: Payer: BLUE CROSS/BLUE SHIELD | Admitting: Nurse Practitioner

## 2017-04-03 ENCOUNTER — Other Ambulatory Visit: Payer: Self-pay | Admitting: Nurse Practitioner

## 2017-04-03 DIAGNOSIS — F419 Anxiety disorder, unspecified: Secondary | ICD-10-CM

## 2018-09-13 ENCOUNTER — Encounter: Payer: BLUE CROSS/BLUE SHIELD | Admitting: Nurse Practitioner

## 2019-06-06 ENCOUNTER — Ambulatory Visit: Payer: BC Managed Care – PPO | Admitting: Adult Health Nurse Practitioner

## 2019-06-06 ENCOUNTER — Encounter: Payer: Self-pay | Admitting: Adult Health Nurse Practitioner

## 2019-06-06 ENCOUNTER — Other Ambulatory Visit: Payer: Self-pay

## 2019-06-06 VITALS — BP 146/93 | HR 76 | Temp 97.9°F | Ht 65.0 in | Wt 247.0 lb

## 2019-06-06 DIAGNOSIS — Z720 Tobacco use: Secondary | ICD-10-CM | POA: Diagnosis not present

## 2019-06-06 DIAGNOSIS — M792 Neuralgia and neuritis, unspecified: Secondary | ICD-10-CM

## 2019-06-06 DIAGNOSIS — I10 Essential (primary) hypertension: Secondary | ICD-10-CM

## 2019-06-06 DIAGNOSIS — R6889 Other general symptoms and signs: Secondary | ICD-10-CM | POA: Diagnosis not present

## 2019-06-06 DIAGNOSIS — N6310 Unspecified lump in the right breast, unspecified quadrant: Secondary | ICD-10-CM | POA: Diagnosis not present

## 2019-06-06 LAB — POCT URINALYSIS DIP (MANUAL ENTRY)
Bilirubin, UA: NEGATIVE
Glucose, UA: NEGATIVE mg/dL
Ketones, POC UA: NEGATIVE mg/dL
Nitrite, UA: NEGATIVE
Protein Ur, POC: NEGATIVE mg/dL
Spec Grav, UA: 1.025 (ref 1.010–1.025)
Urobilinogen, UA: 0.2 E.U./dL
pH, UA: 7.5 (ref 5.0–8.0)

## 2019-06-06 MED ORDER — LISINOPRIL-HYDROCHLOROTHIAZIDE 20-25 MG PO TABS: 1.0000 | ORAL_TABLET | Freq: Every day | ORAL | 1 refills | Status: DC

## 2019-06-06 MED ORDER — VARENICLINE TARTRATE 1 MG PO TABS: 1.0000 mg | ORAL_TABLET | Freq: Two times a day (BID) | ORAL | 1 refills | Status: DC

## 2019-06-06 MED ORDER — GABAPENTIN 100 MG PO CAPS: ORAL_CAPSULE | ORAL | 0 refills | Status: DC

## 2019-06-06 NOTE — Progress Notes (Signed)
Chief Complaint  Patient presents with  . Establish Care    Lump under Rt arm x50month Anxiety 4/Depression 1    HPI   Patient is a pleasant 39year old female, worse with kids reliably for education.  She returns to our office after not seeing anyone for some time.  She has noticed a lump on her right underarm area adjacent to her breast.  Is been there for the past 2 to 4 weeks.  Initially it was somewhat tender but now is not.  She has not had any infection in the area.  She has no history of breast cancer on her maternal side but on her paternal side her grandfather had 1 breast removed for an unknown reason.  Hypertension: Patient has had a diagnosis of hypertension.  Previously on amlodipine.  She has not been on it in some time.  We discussed medication for treating blood pressure at this time.  She does have a cuff at home and I have asked her to get some measures at home.  Smoking: Patient has smoked about a pack a day for the past 10 years but is down to 1/2 pack/day.  She does want to quit and we discussed various methods various methods of quitting.  We discussed Chantix and how to use the medication.  She is at the point where she thinks she wants to go ahead and start.  We discussed the importance of a quit date and support around her.  Neuropathic Pain:   Patient notes pain from her knees down to her toes at night in bed.  Has applied icy hot on her knees for relief.  It feels like a dull, aching pain.  Usually takes it Tylenol PM at night to sleep because of the pain.  It is in a stocking-like distribution and does not bother her during the day.     Problem List    Problem List: 2021-03: Tobacco consumption 2021-03: Breast mass, right 2021-03: Sensation of change in body temperature 2018-07: Bite, insect 2018-07: Swelling of eyelid, left 2018-05: Essential hypertension 2015-02: Disturbance of skin sensation 2015-02: Cervical spondylosis with myelopathy 2014-05:  Abnormality of gait 2014-05: Chronic arthralgias of knees and hips   Allergies   is allergic to aspirin.  Medications    Current Outpatient Medications:  .  acetaminophen (TYLENOL) 500 MG tablet, Take 500-1,000 mg by mouth daily as needed for pain. , Disp: , Rfl:  .  amLODipine (NORVASC) 5 MG tablet, Take 1 tablet (5 mg total) by mouth at bedtime., Disp: 90 tablet, Rfl: 1 .  ibuprofen (ADVIL,MOTRIN) 800 MG tablet, Take 1 tablet (800 mg total) by mouth every 8 (eight) hours as needed., Disp: 30 tablet, Rfl: 0 .  busPIRone (BUSPAR) 7.5 MG tablet, Take 1 tablet (7.5 mg total) by mouth 3 (three) times daily. (Patient not taking: Reported on 06/06/2019), Disp: 30 tablet, Rfl: 0 .  methocarbamol (ROBAXIN) 500 MG tablet, Take 1 tablet (500 mg total) by mouth every 8 (eight) hours as needed for muscle spasms. (Patient not taking: Reported on 06/06/2019), Disp: 20 tablet, Rfl: 0 .  varenicline (CHANTIX CONTINUING MONTH PAK) 1 MG tablet, Take 1 tablet (1 mg total) by mouth 2 (two) times daily., Disp: 60 tablet, Rfl: 1   Review of Systems    Constitutional: Negative for activity change, appetite change, chills and fever.  HENT: Negative for congestion, nosebleeds, trouble swallowing and voice change.   Respiratory: Negative for cough, shortness of breath and wheezing.   Cardiac:  Negative for chest pain, pressure, syncope  Gastrointestinal: Negative for diarrhea, nausea and vomiting.  Genitourinary: Negative for difficulty urinating, dysuria, flank pain and hematuria.  Musculoskeletal: Negative for back pain, joint swelling and neck pain.  Neurological: Negative for dizziness, speech difficulty, light-headedness and numbness.  Psychological ROS: negative  See HPI. All other review of systems negative.     Physical Exam:    height is 5' 5"  (1.651 m) and weight is 247 lb (112 kg). Her temporal temperature is 97.9 F (36.6 C). Her blood pressure is 146/93 (abnormal) and her pulse is 76. Her oxygen  saturation is 99%.   Physical Examination: General appearance - alert, well appearing, and in no distress and oriented to person, place, and time Mental status - normal mood, behavior, speech, dress, motor activity, and thought processes Eyes - PERRL. Extraocular movements intact.  No nystagmus.  Breast exam: abnormal mass palpable on the right approximately 1.5 x 2 cm, non-tender, movable. Neck - supple, no significant adenopathy, carotids upstroke normal bilaterally, no bruits, thyroid exam: thyroid is normal in size without nodules or tenderness Chest - clear to auscultation, no wheezes, rales or rhonchi, symmetric air entry  Heart - normal rate, regular rhythm, normal S1, S2, no murmurs, rubs, clicks or gallops Extremities - dependent LE edema without clubbing or cyanosis Skin - normal coloration and turgor, no rashes, no suspicious skin lesions noted  No hyperpigmentation of skin.  No current hematomas noted   Lab /Imaging Review    orders written for new lab studies as appropriate; see orders, no lab studies available for review at time of visit.   Assessment & Plan:  REFUGIO MCCONICO is a 39 y.o. female    1. Tobacco consumption   2. Breast mass, right   3. Sensation of change in body temperature    Orders Placed This Encounter  Procedures  . CMP14+EGFR  . TSH  . VITAMIN D 25 Hydroxy (Vit-D Deficiency, Fractures)  . POCT urinalysis dipstick   Meds ordered this encounter  Medications  . varenicline (CHANTIX CONTINUING MONTH PAK) 1 MG tablet    Sig: Take 1 tablet (1 mg total) by mouth 2 (two) times daily.    Dispense:  60 tablet    Refill:  1  . lisinopril-hydrochlorothiazide (ZESTORETIC) 20-25 MG tablet    Sig: Take 1 tablet by mouth daily.    Dispense:  30 tablet    Refill:  1   Patient has a mammogram scheduled in 2 days.  If they wanted her to see a primary care provider first.  I endorse that she does have a mass of uncertain significance on the right  axillary/breast line.  Nodes available and physical exam.  Will start her on blood pressure medication and have her get some readings during the month.  We will check basic labs looking at kidney function, liver function and thyroid given some temperature disturbance.  Discussed biochemical addiction to nicotine Also discussed that there is also a hand-to-mouth component Discussed ways to quit smoking including Chantix, Zyban, Zoloft and Nicotine replacement options Discuss behavioral modifications and non-pharmacologic options like acupuncture and hypnosis Discussed smoking cessation hotlines and support groups preparation Blood patient follow-up in 1 month for blood pressure and for Pap and fasting lipids.  She is in line with this plan.  Glyn Ade, NP

## 2019-06-06 NOTE — Patient Instructions (Addendum)
Coping with Quitting Smoking  Quitting smoking is a physical and mental challenge. You will face cravings, withdrawal symptoms, and temptation. Before quitting, work with your health care provider to make a plan that can help you cope. Preparation can help you quit and keep you from giving in. How can I cope with cravings? Cravings usually last for 5-10 minutes. If you get through it, the craving will pass. Consider taking the following actions to help you cope with cravings:  Keep your mouth busy: ? Chew sugar-free gum. ? Suck on hard candies or a straw. ? Brush your teeth.  Keep your hands and body busy: ? Immediately change to a different activity when you feel a craving. ? Squeeze or play with a ball. ? Do an activity or a hobby, like making bead jewelry, practicing needlepoint, or working with wood. ? Mix up your normal routine. ? Take a short exercise break. Go for a quick walk or run up and down stairs. ? Spend time in public places where smoking is not allowed.  Focus on doing something kind or helpful for someone else.  Call a friend or family member to talk during a craving.  Join a support group.  Call a quit line, such as 1-800-QUIT-NOW.  Talk with your health care provider about medicines that might help you cope with cravings and make quitting easier for you. How can I deal with withdrawal symptoms? Your body may experience negative effects as it tries to get used to not having nicotine in the system. These effects are called withdrawal symptoms. They may include:  Feeling hungrier than normal.  Trouble concentrating.  Irritability.  Trouble sleeping.  Feeling depressed.  Restlessness and agitation.  Craving a cigarette. To manage withdrawal symptoms:  Avoid places, people, and activities that trigger your cravings.  Remember why you want to quit.  Get plenty of sleep.  Avoid coffee and other caffeinated drinks. These may worsen some of your  symptoms. How can I handle social situations? Social situations can be difficult when you are quitting smoking, especially in the first few weeks. To manage this, you can:  Avoid parties, bars, and other social situations where people might be smoking.  Avoid alcohol.  Leave right away if you have the urge to smoke.  Explain to your family and friends that you are quitting smoking. Ask for understanding and support.  Plan activities with friends or family where smoking is not an option. What are some ways I can cope with stress? Wanting to smoke may cause stress, and stress can make you want to smoke. Find ways to manage your stress. Relaxation techniques can help. For example:  Breathe slowly and deeply, in through your nose and out through your mouth.  Listen to soothing, relaxing music.  Talk with a family member or friend about your stress.  Light a candle.  Soak in a bath or take a shower.  Think about a peaceful place. What are some ways I can prevent weight gain? Be aware that many people gain weight after they quit smoking. However, not everyone does. To keep from gaining weight, have a plan in place before you quit and stick to the plan after you quit. Your plan should include:  Having healthy snacks. When you have a craving, it may help to: ? Eat plain popcorn, crunchy carrots, celery, or other cut vegetables. ? Chew sugar-free gum.  Changing how you eat: ? Eat small portion sizes at meals. ? Eat 4-6 small meals   throughout the day instead of 1-2 large meals a day. ? Be mindful when you eat. Do not watch television or do other things that might distract you as you eat.  Exercising regularly: ? Make time to exercise each day. If you do not have time for a long workout, do short bouts of exercise for 5-10 minutes several times a day. ? Do some form of strengthening exercise, like weight lifting, and some form of aerobic exercise, like running or swimming.  Drinking  plenty of water or other low-calorie or no-calorie drinks. Drink 6-8 glasses of water daily, or as much as instructed by your health care provider. Summary  Quitting smoking is a physical and mental challenge. You will face cravings, withdrawal symptoms, and temptation to smoke again. Preparation can help you as you go through these challenges.  You can cope with cravings by keeping your mouth busy (such as by chewing gum), keeping your body and hands busy, and making calls to family, friends, or a helpline for people who want to quit smoking.  You can cope with withdrawal symptoms by avoiding places where people smoke, avoiding drinks with caffeine, and getting plenty of rest.  Ask your health care provider about the different ways to prevent weight gain, avoid stress, and handle social situations. This information is not intended to replace advice given to you by your health care provider. Make sure you discuss any questions you have with your health care provider. Document Revised: 02/26/2017 Document Reviewed: 03/13/2016 Elsevier Patient Education  2020 Else[employer] Inc.  

## 2019-06-07 LAB — CMP14+EGFR
ALT: 16 IU/L (ref 0–32)
AST: 26 IU/L (ref 0–40)
Albumin/Globulin Ratio: 2.1 (ref 1.2–2.2)
Albumin: 4.4 g/dL (ref 3.8–4.8)
Alkaline Phosphatase: 67 IU/L (ref 39–117)
BUN/Creatinine Ratio: 9 (ref 9–23)
BUN: 7 mg/dL (ref 6–20)
Bilirubin Total: 0.3 mg/dL (ref 0.0–1.2)
CO2: 24 mmol/L (ref 20–29)
Calcium: 9.3 mg/dL (ref 8.7–10.2)
Chloride: 103 mmol/L (ref 96–106)
Creatinine, Ser: 0.82 mg/dL (ref 0.57–1.00)
GFR calc Af Amer: 104 mL/min/{1.73_m2} (ref >59)
GFR calc non Af Amer: 90 mL/min/{1.73_m2} (ref >59)
Globulin, Total: 2.1 g/dL (ref 1.5–4.5)
Glucose: 96 mg/dL (ref 65–99)
Potassium: 4.5 mmol/L (ref 3.5–5.2)
Sodium: 141 mmol/L (ref 134–144)
Total Protein: 6.5 g/dL (ref 6.0–8.5)

## 2019-06-07 LAB — VITAMIN D 25 HYDROXY (VIT D DEFICIENCY, FRACTURES): Vit D, 25-Hydroxy: 8.3 ng/mL — ABNORMAL LOW (ref 30.0–100.0)

## 2019-06-07 LAB — TSH: TSH: 2.38 u[IU]/mL (ref 0.450–4.500)

## 2019-06-07 MED ORDER — VITAMIN D (ERGOCALCIFEROL) 1.25 MG (50000 UNIT) PO CAPS: 50000.0000 [IU] | ORAL_CAPSULE | ORAL | 3 refills | Status: AC

## 2019-06-07 NOTE — Addendum Note (Signed)
Addended by: Jens Som on: 06/07/2019 08:34 AM   Modules accepted: Orders

## 2019-06-08 ENCOUNTER — Encounter: Payer: Self-pay | Admitting: Adult Health Nurse Practitioner

## 2019-06-08 LAB — HM MAMMOGRAPHY

## 2019-06-12 ENCOUNTER — Telehealth: Payer: Self-pay | Admitting: Adult Health Nurse Practitioner

## 2019-06-12 NOTE — Telephone Encounter (Signed)
Pt wants a call from a nurse to see if she needs to get a covid test. Before she goes back to work today 06/12/19, because she is still experiencing no energy, tired, body aches and head ache  [phone]. Please advise.

## 2019-06-13 NOTE — Telephone Encounter (Signed)
LVM for pt in regards to her concerns about getting a COVID test and I advised her to just go and have a test done.  Please Advise

## 2019-06-19 ENCOUNTER — Other Ambulatory Visit: Payer: Self-pay | Admitting: Radiology

## 2019-06-19 LAB — HM MAMMOGRAPHY

## 2019-06-21 ENCOUNTER — Other Ambulatory Visit: Payer: Self-pay | Admitting: *Deleted

## 2019-06-21 ENCOUNTER — Encounter: Payer: Self-pay | Admitting: *Deleted

## 2019-06-21 DIAGNOSIS — C50411 Malignant neoplasm of upper-outer quadrant of right female breast: Secondary | ICD-10-CM

## 2019-06-21 DIAGNOSIS — Z170 Estrogen receptor positive status [ER+]: Secondary | ICD-10-CM

## 2019-06-22 ENCOUNTER — Other Ambulatory Visit: Payer: Self-pay | Admitting: Adult Health Nurse Practitioner

## 2019-06-22 ENCOUNTER — Encounter: Payer: Self-pay | Admitting: Adult Health Nurse Practitioner

## 2019-06-22 MED ORDER — CHANTIX STARTING MONTH PAK 0.5 MG X 11 & 1 MG X 42 PO TABS: ORAL_TABLET | ORAL | 0 refills | Status: DC

## 2019-06-23 ENCOUNTER — Other Ambulatory Visit: Payer: Self-pay

## 2019-06-23 ENCOUNTER — Ambulatory Visit: Payer: BC Managed Care – PPO | Admitting: Adult Health Nurse Practitioner

## 2019-06-23 ENCOUNTER — Encounter: Payer: Self-pay | Admitting: Adult Health Nurse Practitioner

## 2019-06-23 VITALS — BP 130/86 | HR 98 | Temp 98.0°F | Ht 65.0 in | Wt 249.2 lb

## 2019-06-23 DIAGNOSIS — C50411 Malignant neoplasm of upper-outer quadrant of right female breast: Secondary | ICD-10-CM

## 2019-06-23 DIAGNOSIS — F321 Major depressive disorder, single episode, moderate: Secondary | ICD-10-CM

## 2019-06-23 DIAGNOSIS — R1310 Dysphagia, unspecified: Secondary | ICD-10-CM | POA: Diagnosis not present

## 2019-06-23 DIAGNOSIS — Z170 Estrogen receptor positive status [ER+]: Secondary | ICD-10-CM

## 2019-06-23 MED ORDER — BUPROPION HCL ER (XL) 150 MG PO TB24: ORAL_TABLET | ORAL | 1 refills | Status: DC

## 2019-06-23 NOTE — Patient Instructions (Signed)
Coping with Quitting Smoking  Quitting smoking is a physical and mental challenge. You will face cravings, withdrawal symptoms, and temptation. Before quitting, work with your health care provider to make a plan that can help you cope. Preparation can help you quit and keep you from giving in. How can I cope with cravings? Cravings usually last for 5-10 minutes. If you get through it, the craving will pass. Consider taking the following actions to help you cope with cravings:  Keep your mouth busy: ? Chew sugar-free gum. ? Suck on hard candies or a straw. ? Brush your teeth.  Keep your hands and body busy: ? Immediately change to a different activity when you feel a craving. ? Squeeze or play with a ball. ? Do an activity or a hobby, like making bead jewelry, practicing needlepoint, or working with wood. ? Mix up your normal routine. ? Take a short exercise break. Go for a quick walk or run up and down stairs. ? Spend time in public places where smoking is not allowed.  Focus on doing something kind or helpful for someone else.  Call a friend or family member to talk during a craving.  Join a support group.  Call a quit line, such as 1-800-QUIT-NOW.  Talk with your health care provider about medicines that might help you cope with cravings and make quitting easier for you. How can I deal with withdrawal symptoms? Your body may experience negative effects as it tries to get used to not having nicotine in the system. These effects are called withdrawal symptoms. They may include:  Feeling hungrier than normal.  Trouble concentrating.  Irritability.  Trouble sleeping.  Feeling depressed.  Restlessness and agitation.  Craving a cigarette. To manage withdrawal symptoms:  Avoid places, people, and activities that trigger your cravings.  Remember why you want to quit.  Get plenty of sleep.  Avoid coffee and other caffeinated drinks. These may worsen some of your  symptoms. How can I handle social situations? Social situations can be difficult when you are quitting smoking, especially in the first few weeks. To manage this, you can:  Avoid parties, bars, and other social situations where people might be smoking.  Avoid alcohol.  Leave right away if you have the urge to smoke.  Explain to your family and friends that you are quitting smoking. Ask for understanding and support.  Plan activities with friends or family where smoking is not an option. What are some ways I can cope with stress? Wanting to smoke may cause stress, and stress can make you want to smoke. Find ways to manage your stress. Relaxation techniques can help. For example:  Breathe slowly and deeply, in through your nose and out through your mouth.  Listen to soothing, relaxing music.  Talk with a family member or friend about your stress.  Light a candle.  Soak in a bath or take a shower.  Think about a peaceful place. What are some ways I can prevent weight gain? Be aware that many people gain weight after they quit smoking. However, not everyone does. To keep from gaining weight, have a plan in place before you quit and stick to the plan after you quit. Your plan should include:  Having healthy snacks. When you have a craving, it may help to: ? Eat plain popcorn, crunchy carrots, celery, or other cut vegetables. ? Chew sugar-free gum.  Changing how you eat: ? Eat small portion sizes at meals. ? Eat 4-6 small meals   throughout the day instead of 1-2 large meals a day. ? Be mindful when you eat. Do not watch television or do other things that might distract you as you eat.  Exercising regularly: ? Make time to exercise each day. If you do not have time for a long workout, do short bouts of exercise for 5-10 minutes several times a day. ? Do some form of strengthening exercise, like weight lifting, and some form of aerobic exercise, like running or swimming.  Drinking  plenty of water or other low-calorie or no-calorie drinks. Drink 6-8 glasses of water daily, or as much as instructed by your health care provider. Summary  Quitting smoking is a physical and mental challenge. You will face cravings, withdrawal symptoms, and temptation to smoke again. Preparation can help you as you go through these challenges.  You can cope with cravings by keeping your mouth busy (such as by chewing gum), keeping your body and hands busy, and making calls to family, friends, or a helpline for people who want to quit smoking.  You can cope with withdrawal symptoms by avoiding places where people smoke, avoiding drinks with caffeine, and getting plenty of rest.  Ask your health care provider about the different ways to prevent weight gain, avoid stress, and handle social situations. This information is not intended to replace advice given to you by your health care provider. Make sure you discuss any questions you have with your health care provider. Document Revised: 02/26/2017 Document Reviewed: 03/13/2016 Elsevier Patient Education  2020 Elsevier Inc.  

## 2019-06-23 NOTE — Progress Notes (Signed)
Chief Complaint  Patient presents with  . Mammogram results    breast cancer on right side. new Dx of Breast Cancer  . Depression    PHQ9=8    HPI   Patient presents to discuss new diagnosis of breath cancer.  Pathology was confirmed as.  Malignant neoplasm of upper-outer quadrant of right breast in female, estrogen receptor positive ([place]) [C50.411/Z17.0]   Stage IIB (cT2, cN1(f), cM0, G3, ER+, PR-, HER2+)  She is scheduled with oncology.   Notes depression related to diagnosis but hope.  She is amenable to starting medication for depression and we discussed Buproprion.  She has good family support but does find motivation difficulty.   Hs of hearburn, worsening.  Concerned Upper GI would need to be evaluated given hx of smoking.  Would like to see GI to evaluate.  No hx of Barrett's or known pre-cancerous cells.   PHQ9 SCORE ONLY 07/04/2019 06/23/2019 06/06/2019  Score 1 8 1    GAD 7 : Generalized Anxiety Score 06/06/2019  Nervous, Anxious, on Edge 0  Control/stop worrying 1  Worry too much - different things 1  Trouble relaxing 1  Restless 0  Easily annoyed or irritable 1  Afraid - awful might happen 0  Total GAD 7 Score 4  Anxiety Difficulty Not difficult at all     Problem List    Problem List: 2021-03: Dysphagia 2021-03: Malignant neoplasm of upper-outer quadrant of right breast  in female, estrogen receptor positive ([place]) 2021-03: Tobacco consumption 2021-03: Breast mass, right 2021-03: Sensation of change in body temperature 2021-03: Neuropathic pain 2018-07: Bite, insect 2018-07: Swelling of eyelid, left 2018-05: Essential hypertension 2015-02: Disturbance of skin sensation 2015-02: Cervical spondylosis with myelopathy 2014-05: Abnormality of gait 2014-05: Chronic arthralgias of knees and hips   Allergies   is allergic to aspirin.  Medications    Current Outpatient Medications:  .  acetaminophen (TYLENOL) 500 MG tablet, Take 500-1,000 mg by mouth  daily as needed for pain. , Disp: , Rfl:  .  gabapentin (NEURONTIN) 100 MG capsule, 1-3 tabs qhs for pain, Disp: 90 capsule, Rfl: 0 .  ibuprofen (ADVIL,MOTRIN) 800 MG tablet, Take 1 tablet (800 mg total) by mouth every 8 (eight) hours as needed., Disp: 30 tablet, Rfl: 0 .  lisinopril-hydrochlorothiazide (ZESTORETIC) 20-25 MG tablet, Take 1 tablet by mouth daily., Disp: 30 tablet, Rfl: 1 .  varenicline (CHANTIX STARTING MONTH PAK) 0.5 MG X 11 & 1 MG X 42 tablet, Take one 0.5 mg tablet by mouth once daily for 3 days, then increase to one 0.5 mg tablet twice daily for 4 days, then increase to one 1 mg tablet twice daily., Disp: 53 tablet, Rfl: 0 .  Vitamin D, Ergocalciferol, (DRISDOL) 1.25 MG (50000 UNIT) CAPS capsule, Take 1 capsule (50,000 Units total) by mouth every 7 (seven) days., Disp: 5 capsule, Rfl: 3 .  buPROPion (WELLBUTRIN XL) 150 MG 24 hr tablet, 1 tab qam x 7 days then increase to 2 pills, Disp: 60 tablet, Rfl: 1 .  varenicline (CHANTIX CONTINUING MONTH PAK) 1 MG tablet, Take 1 tablet (1 mg total) by mouth 2 (two) times daily. (Patient not taking: Reported on 06/23/2019), Disp: 60 tablet, Rfl: 1   Review of Systems   Review of Systems See HPI Constitution: No fevers or chills No malaise No diaphoresis Skin: No rash or itching Eyes: no blurry vision, no double vision GU: no dysuria or hematuria Neuro: no dizziness or headaches     Physical Exam:  height is 5' 5"  (1.651 m) and weight is 249 lb 3.2 oz (113 kg). Her temporal temperature is 98 F (36.7 C). Her blood pressure is 130/86 and her pulse is 98. Her oxygen saturation is 97%.   Physical Examination: General appearance - alert, well appearing, and in no distress and oriented to person, place, and time Mental status - normal mood, behavior, speech, dress, motor activity, and thought processes   General appearance: alert, well appearing, and in no distress, oriented to person, place, and time and anxious. Chest: clear to  auscultation, no wheezes, rales or rhonchi, symmetric air entry.  CVS exam: normal rate, regular rhythm, normal S1, S2, no murmurs, rubs, clicks or gallops. Skin exam - normal coloration and turgor, no rashes, no suspicious skin lesions noted. Mental Status: normal mood, behavior, speech, dress, motor activity, and thought processes, anxious.   Lab /Imaging Review    Reviewed, as noted in HpI .   Assessment & Plan:  LANDON TRUAX is a 39 y.o. female    1. Dysphagia, unspecified type   2. Malignant neoplasm of upper-outer quadrant of right breast in female, estrogen receptor positive ([place])   3. Depression, major, single episode, moderate ([place])    Orders Placed This Encounter  Procedures  . Ambulatory referral to Gastroenterology   Meds ordered this encounter  Medications  . buPROPion (WELLBUTRIN XL) 150 MG 24 hr tablet    Sig: 1 tab qam x 7 days then increase to 2 pills    Dispense:  60 tablet    Refill:  [place], NP

## 2019-06-27 ENCOUNTER — Telehealth: Payer: Self-pay

## 2019-06-28 ENCOUNTER — Encounter: Payer: Self-pay | Admitting: Adult Health Nurse Practitioner

## 2019-06-29 ENCOUNTER — Telehealth: Payer: Self-pay | Admitting: Adult Health Nurse Practitioner

## 2019-06-29 ENCOUNTER — Encounter: Payer: Self-pay | Admitting: *Deleted

## 2019-06-29 NOTE — Telephone Encounter (Signed)
Please have provider sign office visit so the [facility] referral can be completed.  Thank you.

## 2019-07-04 ENCOUNTER — Other Ambulatory Visit: Payer: Self-pay

## 2019-07-04 ENCOUNTER — Encounter: Payer: Self-pay | Admitting: Adult Health Nurse Practitioner

## 2019-07-04 ENCOUNTER — Ambulatory Visit: Payer: BC Managed Care – PPO | Admitting: Adult Health Nurse Practitioner

## 2019-07-04 VITALS — BP 123/81 | HR 72 | Temp 98.3°F | Resp 17 | Ht 65.0 in | Wt 248.2 lb

## 2019-07-04 DIAGNOSIS — N898 Other specified noninflammatory disorders of vagina: Secondary | ICD-10-CM

## 2019-07-04 LAB — POCT WET + KOH PREP
Trich by wet prep: ABSENT
Yeast by KOH: ABSENT
Yeast by wet prep: ABSENT

## 2019-07-04 MED ORDER — METRONIDAZOLE 500 MG PO TABS: 500.0000 mg | ORAL_TABLET | Freq: Three times a day (TID) | ORAL | 1 refills | Status: DC

## 2019-07-04 NOTE — Progress Notes (Unsigned)
Heidi Costa

## 2019-07-04 NOTE — Patient Instructions (Signed)
° ° ° °  If you have lab work done today you will be contacted with your lab results within the next 2 weeks.  If you have not heard from us then please contact us. The fastest way to get your results is to register for My Chart. ° ° °IF you received an x-ray today, you will receive an invoice from [facility]. Please contact [facility] at [phone] with questions or concerns regarding your invoice.  ° °IF you received labwork today, you will receive an invoice from [facility]. Please contact [facility] at [phone] with questions or concerns regarding your invoice.  ° °Our billing staff will not be able to assist you with questions regarding bills from these companies. ° °You will be contacted with the lab results as soon as they are available. The fastest way to get your results is to activate your My Chart account. Instructions are located on the last page of this paperwork. If you have not heard from us regarding the results in 2 weeks, please contact this office. °  ° ° ° °

## 2019-07-04 NOTE — Progress Notes (Signed)
Radiation Oncology         [phone] ________________________________  Name: Heidi Costa        MRN: [id number]  Date of Service: 07/05/2019 DOB: [birth year]  LD:JTTS, Lorelee Market, NP  Wendall Mola, NP     REFERRING PHYSICIAN: Wendall Mola, NP   DIAGNOSIS: The encounter diagnosis was Malignant neoplasm of upper-outer quadrant of right breast in female, estrogen receptor positive ([address]).   HISTORY OF PRESENT ILLNESS: Heidi Costa is a 39 y.o. female seen in the multidisciplinary breast clinic for a new diagnosis of right breast cancer. he patient was noted to have a mass in the right breast and was seen by her PCP.  She was sent for diagnostic imaging of the breast at [facility] and by ultrasound there was a mass measuring 1.8 x 2 x 1.4 cm in the right breast at 10:00 which was hypoechoic and correlated to a diagnostic mammography finding.  In the right axilla she had a 3.6 x 3.7 x 2.9 cm enlarged lymph node with a circumscribed margin without fatty hilum there is also been a 5 mm lesion seen in the left breast on diagnostic mammography and ultrasonography revealed normal fibroglandular tissue without correlate.  She underwent a biopsy on 06/19/2019 the left breast lower inner quadrant was biopsied stereotactically and revealed mild fibrocystic change.  The right breast mass at 10:00 was biopsied and revealed a grade 3 invasive ductal carcinoma, her tumor was ER positive, PR negative HER-2 amplified with a Ki 67 of 80%, and her lymph node in the right axilla was also biopsied and consistent with ductal carcinoma.  She is seen today to discuss treatment recommendations for her cancer.    PREVIOUS RADIATION THERAPY: No   PAST MEDICAL HISTORY:  Past Medical History:  Diagnosis Date  . Abnormality of gait 08/02/2012  . Anemia    iron taking in the past, not currently  . Anxiety   . Arthritis    spine- cerv.  & arm   . Asthma   . Bulging lumbar disc   . Chronic  arthralgias of knees and hips 08/02/2012  . Depression    Pt. took self off Celexa one month ago, states she did n't like how it made her feel .  Marland Kitchen GERD (gastroesophageal reflux disease)   . Heart murmur    pt. been told that this is a fact, no echo or cardiac surveillance in the past   . Hypertension   . Obesity   . Spinal stenosis        PAST SURGICAL HISTORY: Past Surgical History:  Procedure Laterality Date  . ANTERIOR CERVICAL DECOMP/DISCECTOMY FUSION N/A 10/27/2012   Procedure: ANTERIOR CERVICAL DECOMPRESSION/DISCECTOMY FUSION 1 LEVEL;  Surgeon: Faythe Ghee, MD;  Location: [place] [facility];  Service: Neurosurgery;  Laterality: N/A;  ANTERIOR CERVICAL DECOMPRESSION/DISCECTOMY FUSION 1 LEVEL  . CHALAZION EXCISION     L eye   . VAGINAL DELIVERY  2002     FAMILY HISTORY:  Family History  Problem Relation Age of Onset  . Osteoarthritis Mother   . Hypertension Mother   . Diabetes Mother   . Hypertension Father   . Gout Father   . Stroke Father 38       embolic  . Diabetes Brother   . Hypertension Brother   . Heart failure Brother 54  . Kidney disease Brother   . Diabetes Paternal Grandfather   . Cancer Paternal Grandfather 57  breast cancer     SOCIAL HISTORY:  reports that she has quit smoking. Her smoking use included cigarettes. She has a 10.00 pack-year smoking history. She has never used smokeless tobacco. She reports current alcohol use. She reports current drug use. Drug: Marijuana. The patient is married and lives in [place]. 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   ALLERGIES: Aspirin   MEDICATIONS:  Current Outpatient Medications  Medication Sig Dispense Refill  . acetaminophen (TYLENOL) 500 MG tablet Take 500-1,000 mg by mouth daily as needed for pain.     Marland Kitchen buPROPion (WELLBUTRIN XL) 150 MG 24 hr tablet 1 tab qam x 7 days then increase to 2 pills 60 tablet 1  . gabapentin (NEURONTIN) 100 MG capsule  1-3 tabs qhs for pain 90 capsule 0  . ibuprofen (ADVIL,MOTRIN) 800 MG tablet Take 1 tablet (800 mg total) by mouth every 8 (eight) hours as needed. 30 tablet 0  . lisinopril-hydrochlorothiazide (ZESTORETIC) 20-25 MG tablet Take 1 tablet by mouth daily. 30 tablet 1  . metroNIDAZOLE (FLAGYL) 500 MG tablet Take 1 tablet (500 mg total) by mouth 3 (three) times daily. 21 tablet 1  . varenicline (CHANTIX CONTINUING MONTH PAK) 1 MG tablet Take 1 tablet (1 mg total) by mouth 2 (two) times daily. (Patient not taking: Reported on 07/04/2019) 60 tablet 1  . varenicline (CHANTIX STARTING MONTH PAK) 0.5 MG X 11 & 1 MG X 42 tablet Take one 0.5 mg tablet by mouth once daily for 3 days, then increase to one 0.5 mg tablet twice daily for 4 days, then increase to one 1 mg tablet twice daily. (Patient not taking: Reported on 07/04/2019) 53 tablet 0  . Vitamin D, Ergocalciferol, (DRISDOL) 1.25 MG (50000 UNIT) CAPS capsule Take 1 capsule (50,000 Units total) by mouth every 7 (seven) days. 5 capsule 3   No current facility-administered medications for this visit.     REVIEW OF SYSTEMS: On review of systems, the patient reports that she is doing well overall. She denies any chest pain, shortness of breath, cough, fevers, chills, night sweats, unintended weight changes. She denies any bowel or bladder disturbances, and denies abdominal pain, nausea or vomiting. She denies any new musculoskeletal or joint aches or pains. A complete review of systems is obtained and is otherwise negative.     PHYSICAL EXAM:  Wt Readings from Last 3 Encounters:  07/04/19 248 lb 3.2 oz (112.6 kg)  06/23/19 249 lb 3.2 oz (113 kg)  06/06/19 247 lb (112 kg)   Temp Readings from Last 3 Encounters:  07/04/19 98.3 F (36.8 C) (Temporal)  06/23/19 98 F (36.7 C) (Temporal)  06/06/19 97.9 F (36.6 C) (Temporal)   BP Readings from Last 3 Encounters:  07/04/19 123/81  06/23/19 130/86  06/06/19 (!) 146/93   Pulse Readings from Last 3  Encounters:  07/04/19 72  06/23/19 98  06/06/19 76    In general this is a well appearing African American female in no acute distress. She's alert and oriented x4 and appropriate throughout the examination. Cardiopulmonary assessment is negative for acute distress and she exhibits normal effort. Bilateral breast exam is deferred.    ECOG = 0  0 - Asymptomatic (Fully active, able to carry on all predisease activities without restriction)  1 - Symptomatic but completely ambulatory (Restricted in physically strenuous activity but ambulatory and able to carry out work of a light or sedentary nature. For example, light housework, office  work)  2 - Symptomatic, <50% in bed during the day (Ambulatory and capable of all self care but unable to carry out any work activities. Up and about more than 50% of waking hours)  3 - Symptomatic, >50% in bed, but not bedbound (Capable of only limited self-care, confined to bed or chair 50% or more of waking hours)  4 - Bedbound (Completely disabled. Cannot carry on any self-care. Totally confined to bed or chair)  5 - Death   Eustace Pen MM, Creech RH, Tormey DC, et al. [id number]). "Toxicity and response criteria of the [facility] Group". Otho Oncol. 5 (6): 649-55    LABORATORY DATA:  Lab Results  Component Value Date   WBC 7.9 07/11/2015   HGB 13.2 07/11/2015   HCT 37.6 (A) 07/11/2015   MCV 93.1 07/11/2015   PLT 305 10/25/2012   Lab Results  Component Value Date   NA 141 06/06/2019   K 4.5 06/06/2019   CL 103 06/06/2019   CO2 24 06/06/2019   Lab Results  Component Value Date   ALT 16 06/06/2019   AST 26 06/06/2019   ALKPHOS 67 06/06/2019   BILITOT 0.3 06/06/2019      RADIOGRAPHY: No results found.     IMPRESSION/PLAN: 1. 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 Dr. Lisbeth Renshaw discusses the pathology findings and reviews the nature of node positive breast  disease. The consensus from the breast conference includes MRI and neoadjuvant chemotherapy to downsize her tumor, followed by possible breast conservation with lumpectomy with targeted node biopsy. She would benefit from external radiotherapy to the breast and regional nodes followed by antiestrogen therapy. We discussed the risks, benefits, short, and long term effects of radiotherapy, and the patient is interested in proceeding. Dr. Lisbeth Renshaw discusses the delivery and logistics of radiotherapy and anticipates a course of 6 1/2  weeks of radiotherapy to the right breast and regional nodes. We will see her back about 2 weeks after surgery to discuss the simulation process and anticipate we starting radiotherapy about 4-6 weeks after surgery.  2. Possible genetic predisposition to malignancy. The patient is a candidate for genetic testing given her personal and family history. She was offered referral and she is proceeding with this today. 3. Contraceptive Counseling. Dr. Lindi Adie has recommended copper IUD for her. We will revisit pregnancy testing needs at the appropriate time.  4. Left breast lesion. Her biopsy was negative for disease, and showed fibrocystic change, but the biopsy was felt to be discordant, though difficult to be able to rebiopsy with confidence of having diagnostic material. This will be followed with her MRIs that will be done for her neoadjuvant therapy and hopefully followed expectantly.  In a visit lasting 45 minutes, greater than 50% of the time was spent face to face reviewing her case, as well as in preparation of, discussing, and coordinating the patient's care.  The above documentation reflects my direct findings during this shared patient visit. Please see the separate note by Dr. Lisbeth Renshaw on this date for the remainder of the patient's plan of care.    Carola Rhine, PAC

## 2019-07-04 NOTE — Progress Notes (Signed)
[facility] NOTE  Patient Care Team: Wendall Mola, NP as PCP - General (Adult Health Nurse Practitioner) Rockwell Germany, RN as Oncology Nurse Navigator Mauro Kaufmann, RN as Oncology Nurse Navigator Rolm Bookbinder, MD as Consulting Physician (General Surgery) Nicholas Lose, MD as Consulting Physician (Hematology and Oncology) Kyung Rudd, MD as Consulting Physician (Radiation Oncology)  CHIEF COMPLAINTS/PURPOSE OF CONSULTATION:  Newly diagnosed breast cancer  HISTORY OF PRESENTING ILLNESS:  Heidi Costa 39 y.o. female is here because of recent diagnosis of invasive ductal carcinoma of the right breast. Patient palpated a painful right breast lump x3 weeks. Mammogram on 06/08/19 showed a 2.0cm mass at the 10 o'clock position in the right breast and 3.6cm enlarged lymph node in the right axilla, and a 0.5cm mass in the lower left breast. Biopsy on 06/19/19 showed, in the right breast and axilla, invasive ductal carcinoma, grade 3, HER-2 equivocal by IHC, positive by FISH, ER+ 40%, PR - 0%, Ki67 80%, and in the left breast, fibrocystic changes and no evidence of malignancy. She presents to the clinic today for initial evaluation and discussion of treatment options.   I reviewed her records extensively and collaborated the history with the patient.  SUMMARY OF ONCOLOGIC HISTORY: Oncology History  Malignant neoplasm of upper-outer quadrant of right breast in female, estrogen receptor positive ([place])  06/21/2019 Initial Diagnosis   Patient palpated a painful right breast lump x3 weeks. Mammogram showed a 2.0cm right breast mass at the 10 o'clock position, a 3.6cm enlarged right axillary lymph node, and a 0.5cm left breast mass. Biopsy showed, in the right breast and axilla, IDC, grade 3, HER-2 equivocal by IHC, + by FISH, ER+ 40%, PR - 0%, Ki67 80%, and in the left breast, fibrocystic changes, no malignancy.    07/04/2019 Cancer Staging   Staging form: Breast,  AJCC 8th Edition - Clinical stage from 07/04/2019: Stage IIB (cT2, cN1(f), cM0, G3, ER+, PR-, HER2+) - Signed by Nicholas Lose, MD on 07/05/2019     MEDICAL HISTORY:  Past Medical History:  Diagnosis Date  . Abnormality of gait 08/02/2012  . Anemia    iron taking in the past, not currently  . Anxiety   . Arthritis    spine- cerv.  & arm   . Asthma   . Bulging lumbar disc   . Chronic arthralgias of knees and hips 08/02/2012  . Depression    Pt. took self off Celexa one month ago, states she did n't like how it made her feel .  Marland Kitchen GERD (gastroesophageal reflux disease)   . Heart murmur    pt. been told that this is a fact, no echo or cardiac surveillance in the past   . Hypertension   . Obesity   . Spinal stenosis     SURGICAL HISTORY: Past Surgical History:  Procedure Laterality Date  . ANTERIOR CERVICAL DECOMP/DISCECTOMY FUSION N/A 10/27/2012   Procedure: ANTERIOR CERVICAL DECOMPRESSION/DISCECTOMY FUSION 1 LEVEL;  Surgeon: Faythe Ghee, MD;  Location: [facility];  Service: Neurosurgery;  Laterality: N/A;  ANTERIOR CERVICAL DECOMPRESSION/DISCECTOMY FUSION 1 LEVEL  . CHALAZION EXCISION     L eye   . VAGINAL DELIVERY  2002    SOCIAL HISTORY: Social History   Socioeconomic History  . Marital status: Married    Spouse name: Not on file  . Number of children: 1  . Years of education: associates  . Highest education level: Not on file  Occupational History  Employer: OTHER  Tobacco Use  . Smoking status: Former Smoker    Packs/day: 1.00    Years: 10.00    Pack years: 10.00    Types: Cigarettes  . Smokeless tobacco: Never Used  Substance and Sexual Activity  . Alcohol use: Yes    Comment: occas  . Drug use: Yes    Types: Marijuana    Comment: occ  . Sexual activity: Yes    Birth control/protection: None  Other Topics Concern  . Not on file  Social History Narrative   Patient is married with one child.   Patient is right handed.   Patient has a Associate's  degree.   Patient drinks 2-3 cups daily.   Social Determinants of Health   Financial Resource Strain:   . Difficulty of Paying Living Expenses:   Food Insecurity:   . Worried About Charity fundraiser in the Last Year:   . Arboriculturist in the Last Year:   Transportation Needs:   . Film/video editor (Medical):   Marland Kitchen Lack of Transportation (Non-Medical):   Physical Activity:   . Days of Exercise per Week:   . Minutes of Exercise per Session:   Stress:   . Feeling of Stress :   Social Connections:   . Frequency of Communication with Friends and Family:   . Frequency of Social Gatherings with Friends and Family:   . Attends Religious Services:   . Active Member of Clubs or Organizations:   . Attends Archivist Meetings:   Marland Kitchen Marital Status:   Intimate Partner Violence:   . Fear of Current or Ex-Partner:   . Emotionally Abused:   Marland Kitchen Physically Abused:   . Sexually Abused:     FAMILY HISTORY: Family History  Problem Relation Age of Onset  . Osteoarthritis Mother   . Hypertension Mother   . Diabetes Mother   . Hypertension Father   . Gout Father   . Stroke Father 88       embolic  . Diabetes Brother   . Hypertension Brother   . Heart failure Brother 27  . Kidney disease Brother   . Diabetes Paternal Grandfather   . Cancer Paternal Grandfather 59       breast cancer    ALLERGIES:  is allergic to aspirin.  MEDICATIONS:  Current Outpatient Medications  Medication Sig Dispense Refill  . acetaminophen (TYLENOL) 500 MG tablet Take 500-1,000 mg by mouth daily as needed for pain.     Marland Kitchen buPROPion (WELLBUTRIN XL) 150 MG 24 hr tablet 1 tab qam x 7 days then increase to 2 pills 60 tablet 1  . gabapentin (NEURONTIN) 100 MG capsule 1-3 tabs qhs for pain 90 capsule 0  . ibuprofen (ADVIL,MOTRIN) 800 MG tablet Take 1 tablet (800 mg total) by mouth every 8 (eight) hours as needed. 30 tablet 0  . lisinopril-hydrochlorothiazide (ZESTORETIC) 20-25 MG tablet Take 1 tablet  by mouth daily. 30 tablet 1  . Vitamin D, Ergocalciferol, (DRISDOL) 1.25 MG (50000 UNIT) CAPS capsule Take 1 capsule (50,000 Units total) by mouth every 7 (seven) days. 5 capsule 3  . metroNIDAZOLE (FLAGYL) 500 MG tablet Take 1 tablet (500 mg total) by mouth 3 (three) times daily. (Patient not taking: Reported on 07/05/2019) 21 tablet 1  . varenicline (CHANTIX CONTINUING MONTH PAK) 1 MG tablet Take 1 tablet (1 mg total) by mouth 2 (two) times daily. (Patient not taking: Reported on 07/04/2019) 60 tablet 1  . varenicline (CHANTIX  STARTING MONTH PAK) 0.5 MG X 11 & 1 MG X 42 tablet Take one 0.5 mg tablet by mouth once daily for 3 days, then increase to one 0.5 mg tablet twice daily for 4 days, then increase to one 1 mg tablet twice daily. (Patient not taking: Reported on 07/04/2019) 53 tablet 0   No current facility-administered medications for this visit.    REVIEW OF SYSTEMS:   Constitutional: Denies fevers, chills or abnormal night sweats Eyes: Denies blurriness of vision, double vision or watery eyes Ears, nose, mouth, throat, and face: Denies mucositis or sore throat Respiratory: Denies cough, dyspnea or wheezes Cardiovascular: Denies palpitation, chest discomfort or lower extremity swelling Gastrointestinal:  Denies nausea, heartburn or change in bowel habits Skin: Denies abnormal skin rashes Lymphatics: Denies new lymphadenopathy or easy bruising Neurological:Denies numbness, tingling or new weaknesses Behavioral/Psych: Mood is stable, no new changes  Breast: Palpable right breast mass All other systems were reviewed with the patient and are negative.  PHYSICAL EXAMINATION: ECOG PERFORMANCE STATUS: 1 - Symptomatic but completely ambulatory  Vitals:   07/05/19 0837  BP: (!) 146/93  Pulse: 62  Resp: 20  Temp: 98.1 F (36.7 C)  SpO2: 100%   Filed Weights   07/05/19 0837  Weight: 247 lb 4.8 oz (112.2 kg)    GENERAL:alert, no distress and comfortable SKIN: skin color, texture,  turgor are normal, no rashes or significant lesions EYES: normal, conjunctiva are pink and non-injected, sclera clear OROPHARYNX:no exudate, no erythema and lips, buccal mucosa, and tongue normal  NECK: supple, thyroid normal size, non-tender, without nodularity LYMPH:  no palpable lymphadenopathy in the cervical, axillary or inguinal LUNGS: clear to auscultation and percussion with normal breathing effort HEART: regular rate & rhythm and no murmurs and no lower extremity edema ABDOMEN:abdomen soft, non-tender and normal bowel sounds Musculoskeletal:no cyanosis of digits and no clubbing  PSYCH: alert & oriented x 3 with fluent speech NEURO: no focal motor/sensory deficits   LABORATORY DATA:  I have reviewed the data as listed Lab Results  Component Value Date   WBC 5.7 07/05/2019   HGB 13.4 07/05/2019   HCT 39.4 07/05/2019   MCV 92.7 07/05/2019   PLT 351 07/05/2019   Lab Results  Component Value Date   NA 141 07/05/2019   K 3.5 07/05/2019   CL 107 07/05/2019   CO2 22 07/05/2019    RADIOGRAPHIC STUDIES: I have personally reviewed the radiological reports and agreed with the findings in the report.  ASSESSMENT AND PLAN:  Malignant neoplasm of upper-outer quadrant of right breast in female, estrogen receptor positive ([place]) 06/21/2019: Patient palpated a painful right breast lump x3 weeks. Mammogram showed a 2.0cm right breast mass at the 10 o'clock position, a 3.6cm enlarged right axillary lymph node, and a 0.5cm left breast mass. Biopsy showed, in the right breast and axilla (lymph node), IDC, grade 3, HER-2 equivocal by IHC, + by FISH, ER+ 40%, PR - 0%, Ki67 80%,  left breast, fibrocystic changes, no malignancy.  Can be followed.  Pathology and radiology counseling: Discussed with the patient, the details of pathology including the type of breast cancer,the clinical staging, the significance of ER, PR and HER-2/neu receptors and the implications for treatment. After reviewing the  pathology in detail, we proceeded to discuss the different treatment options between surgery, radiation, chemotherapy, antiestrogen therapies.  Recommendation based on multidisciplinary tumor board: 1. Neoadjuvant chemotherapy with TCH Perjeta 6 cycles followed by Herceptin Perjeta versus Kadcyla maintenance for 1 year 2. Followed by  breast conserving surgery if possible with sentinel lymph node study 3. Followed by adjuvant radiation therapy if patient had lumpectomy 4.  Consideration for neratinib Genetic counseling  Chemotherapy Counseling: I discussed the risks and benefits of chemotherapy including the risks of nausea/ vomiting, risk of infection from low WBC count, fatigue due to chemo or anemia, bruising or bleeding due to low platelets, mouth sores, loss/ change in taste and decreased appetite. Liver and kidney function will be monitored through out chemotherapy as abnormalities in liver and kidney function may be a side effect of treatment. Cardiac dysfunction due to Herceptin and Perjeta were discussed in detail. Risk of permanent bone marrow dysfunction due to chemo were also discussed.  Plan: 1. Port placement 2. Echocardiogram 3. Chemotherapy class 4. Breast MRI    Return to clinic in 2 weeks to start chemotherapy.    All questions were answered. The patient knows to call the clinic with any problems, questions or concerns.   Rulon Eisenmenger, MD, MPH 07/05/2019    I, Molly Dorshimer, am acting as scribe for Nicholas Lose, MD.  I have reviewed the above documentation for accuracy and completeness, and I agree with the above.

## 2019-07-05 ENCOUNTER — Other Ambulatory Visit: Payer: Self-pay

## 2019-07-05 ENCOUNTER — Inpatient Hospital Stay: Payer: BC Managed Care – PPO | Attending: Hematology and Oncology | Admitting: Hematology and Oncology

## 2019-07-05 ENCOUNTER — Ambulatory Visit: Payer: BC Managed Care – PPO | Attending: General Surgery | Admitting: Physical Therapy

## 2019-07-05 ENCOUNTER — Encounter: Payer: Self-pay | Admitting: *Deleted

## 2019-07-05 ENCOUNTER — Telehealth: Payer: Self-pay

## 2019-07-05 ENCOUNTER — Encounter: Payer: Self-pay | Admitting: Physical Therapy

## 2019-07-05 ENCOUNTER — Other Ambulatory Visit: Payer: Self-pay | Admitting: General Surgery

## 2019-07-05 ENCOUNTER — Inpatient Hospital Stay: Payer: BC Managed Care – PPO

## 2019-07-05 ENCOUNTER — Encounter: Payer: Self-pay | Admitting: Genetic Counselor

## 2019-07-05 ENCOUNTER — Ambulatory Visit
Admission: RE | Admit: 2019-07-05 | Discharge: 2019-07-05 | Disposition: A | Discharge status: Home / Self Care | Payer: BC Managed Care – PPO | Source: Ambulatory Visit | Attending: Radiation Oncology | Admitting: Radiation Oncology

## 2019-07-05 ENCOUNTER — Ambulatory Visit (HOSPITAL_BASED_OUTPATIENT_CLINIC_OR_DEPARTMENT_OTHER): Payer: BC Managed Care – PPO | Admitting: Genetic Counselor

## 2019-07-05 VITALS — BP 146/93 | HR 62 | Temp 98.1°F | Resp 20 | Ht 65.0 in | Wt 247.3 lb

## 2019-07-05 DIAGNOSIS — Z170 Estrogen receptor positive status [ER+]: Secondary | ICD-10-CM

## 2019-07-05 DIAGNOSIS — Z5111 Encounter for antineoplastic chemotherapy: Secondary | ICD-10-CM | POA: Diagnosis not present

## 2019-07-05 DIAGNOSIS — R197 Diarrhea, unspecified: Secondary | ICD-10-CM | POA: Diagnosis not present

## 2019-07-05 DIAGNOSIS — R112 Nausea with vomiting, unspecified: Secondary | ICD-10-CM | POA: Diagnosis not present

## 2019-07-05 DIAGNOSIS — C773 Secondary and unspecified malignant neoplasm of axilla and upper limb lymph nodes: Secondary | ICD-10-CM | POA: Insufficient documentation

## 2019-07-05 DIAGNOSIS — C50411 Malignant neoplasm of upper-outer quadrant of right female breast: Secondary | ICD-10-CM

## 2019-07-05 DIAGNOSIS — I10 Essential (primary) hypertension: Secondary | ICD-10-CM | POA: Diagnosis not present

## 2019-07-05 DIAGNOSIS — R011 Cardiac murmur, unspecified: Secondary | ICD-10-CM | POA: Diagnosis not present

## 2019-07-05 DIAGNOSIS — R293 Abnormal posture: Secondary | ICD-10-CM | POA: Diagnosis present

## 2019-07-05 DIAGNOSIS — M4800 Spinal stenosis, site unspecified: Secondary | ICD-10-CM | POA: Insufficient documentation

## 2019-07-05 DIAGNOSIS — Z87891 Personal history of nicotine dependence: Secondary | ICD-10-CM | POA: Diagnosis not present

## 2019-07-05 DIAGNOSIS — J45909 Unspecified asthma, uncomplicated: Secondary | ICD-10-CM | POA: Insufficient documentation

## 2019-07-05 DIAGNOSIS — Z809 Family history of malignant neoplasm, unspecified: Secondary | ICD-10-CM

## 2019-07-05 DIAGNOSIS — F418 Other specified anxiety disorders: Secondary | ICD-10-CM | POA: Diagnosis not present

## 2019-07-05 DIAGNOSIS — Z7952 Long term (current) use of systemic steroids: Secondary | ICD-10-CM | POA: Insufficient documentation

## 2019-07-05 DIAGNOSIS — K219 Gastro-esophageal reflux disease without esophagitis: Secondary | ICD-10-CM | POA: Diagnosis not present

## 2019-07-05 DIAGNOSIS — Z79899 Other long term (current) drug therapy: Secondary | ICD-10-CM | POA: Insufficient documentation

## 2019-07-05 DIAGNOSIS — E669 Obesity, unspecified: Secondary | ICD-10-CM | POA: Insufficient documentation

## 2019-07-05 LAB — CBC WITH DIFFERENTIAL (CANCER CENTER ONLY)
Abs Immature Granulocytes: 0.01 10*3/uL (ref 0.00–0.07)
Basophils Absolute: 0 10*3/uL (ref 0.0–0.1)
Basophils Relative: 1 %
Eosinophils Absolute: 0.3 10*3/uL (ref 0.0–0.5)
Eosinophils Relative: 4 %
HCT: 39.4 % (ref 36.0–46.0)
Hemoglobin: 13.4 g/dL (ref 12.0–15.0)
Immature Granulocytes: 0 %
Lymphocytes Relative: 38 %
Lymphs Abs: 2.2 10*3/uL (ref 0.7–4.0)
MCH: 31.5 pg (ref 26.0–34.0)
MCHC: 34 g/dL (ref 30.0–36.0)
MCV: 92.7 fL (ref 80.0–100.0)
Monocytes Absolute: 0.4 10*3/uL (ref 0.1–1.0)
Monocytes Relative: 7 %
Neutro Abs: 2.8 10*3/uL (ref 1.7–7.7)
Neutrophils Relative %: 50 %
Platelet Count: 351 10*3/uL (ref 150–400)
RBC: 4.25 MIL/uL (ref 3.87–5.11)
RDW: 12.7 % (ref 11.5–15.5)
WBC Count: 5.7 10*3/uL (ref 4.0–10.5)
nRBC: 0 % (ref 0.0–0.2)

## 2019-07-05 LAB — CMP (CANCER CENTER ONLY)
ALT: 15 U/L (ref 0–44)
AST: 15 U/L (ref 15–41)
Albumin: 3.5 g/dL (ref 3.5–5.0)
Alkaline Phosphatase: 61 U/L (ref 38–126)
Anion gap: 12 (ref 5–15)
BUN: 8 mg/dL (ref 6–20)
CO2: 22 mmol/L (ref 22–32)
Calcium: 8.7 mg/dL — ABNORMAL LOW (ref 8.9–10.3)
Chloride: 107 mmol/L (ref 98–111)
Creatinine: 0.96 mg/dL (ref 0.44–1.00)
GFR, Est AFR Am: >60 mL/min (ref >60)
GFR, Estimated: >60 mL/min (ref >60)
Glucose, Bld: 172 mg/dL — ABNORMAL HIGH (ref 70–99)
Potassium: 3.5 mmol/L (ref 3.5–5.1)
Sodium: 141 mmol/L (ref 135–145)
Total Bilirubin: 0.3 mg/dL (ref 0.3–1.2)
Total Protein: 6.7 g/dL (ref 6.5–8.1)

## 2019-07-05 LAB — GENETIC SCREENING ORDER

## 2019-07-05 MED ORDER — ONDANSETRON HCL 8 MG PO TABS: 8.0000 mg | ORAL_TABLET | Freq: Two times a day (BID) | ORAL | 1 refills | Status: DC | PRN

## 2019-07-05 MED ORDER — DEXAMETHASONE 4 MG PO TABS: 4.0000 mg | ORAL_TABLET | Freq: Every day | ORAL | 1 refills | Status: DC

## 2019-07-05 MED ORDER — LIDOCAINE-PRILOCAINE 2.5-2.5 % EX CREA: TOPICAL_CREAM | CUTANEOUS | 3 refills | Status: DC

## 2019-07-05 MED ORDER — LORAZEPAM 0.5 MG PO TABS: 0.5000 mg | ORAL_TABLET | Freq: Every evening | ORAL | 0 refills | Status: DC | PRN

## 2019-07-05 MED ORDER — PROCHLORPERAZINE MALEATE 10 MG PO TABS: 10.0000 mg | ORAL_TABLET | Freq: Four times a day (QID) | ORAL | 1 refills | Status: DC | PRN

## 2019-07-05 NOTE — Assessment & Plan Note (Signed)
06/21/2019: Patient palpated a painful right breast lump x3 weeks. Mammogram showed a 2.0cm right breast mass at the 10 o'clock position, a 3.6cm enlarged right axillary lymph node, and a 0.5cm left breast mass. Biopsy showed, in the right breast and axilla (lymph node), IDC, grade 3, HER-2 equivocal by IHC, + by FISH, ER+ 40%, PR - 0%, Ki67 80%,  left breast, fibrocystic changes, no malignancy.  Can be followed.  Pathology and radiology counseling: Discussed with the patient, the details of pathology including the type of breast cancer,the clinical staging, the significance of ER, PR and HER-2/neu receptors and the implications for treatment. After reviewing the pathology in detail, we proceeded to discuss the different treatment options between surgery, radiation, chemotherapy, antiestrogen therapies.  Recommendation based on multidisciplinary tumor board: 1. Neoadjuvant chemotherapy with TCH Perjeta 6 cycles followed by Herceptin Perjeta versus Kadcyla maintenance for 1 year 2. Followed by breast conserving surgery if possible with sentinel lymph node study 3. Followed by adjuvant radiation therapy if patient had lumpectomy 4.  Consideration for neratinib  Chemotherapy Counseling: I discussed the risks and benefits of chemotherapy including the risks of nausea/ vomiting, risk of infection from low WBC count, fatigue due to chemo or anemia, bruising or bleeding due to low platelets, mouth sores, loss/ change in taste and decreased appetite. Liver and kidney function will be monitored through out chemotherapy as abnormalities in liver and kidney function may be a side effect of treatment. Cardiac dysfunction due to Herceptin and Perjeta were discussed in detail. Risk of permanent bone marrow dysfunction due to chemo were also discussed.  Plan: 1. Port placement 2. Echocardiogram 3. Chemotherapy class 4. Breast MRI    Return to clinic in 2 weeks to start chemotherapy.

## 2019-07-05 NOTE — Progress Notes (Signed)
START ON PATHWAY REGIMEN - Breast ° ° °  A cycle is every 21 days: °    Pertuzumab  °    Pertuzumab  °    Trastuzumab-xxxx  °    Trastuzumab-xxxx  °    Carboplatin  °    Docetaxel  ° °**Always confirm dose/schedule in your pharmacy ordering system** ° °Patient Characteristics: °Preoperative or Nonsurgical Candidate (Clinical Staging), Neoadjuvant Therapy followed by Surgery, Invasive Disease, Chemotherapy, HER2 Positive, ER Negative/Unknown °Therapeutic Status: Preoperative or Nonsurgical Candidate (Clinical Staging) °AJCC M Category: cM0 °AJCC Grade: G3 °Breast Surgical Plan: Neoadjuvant Therapy followed by Surgery °ER Status: Negative (-) °AJCC 8 Stage Grouping: IIA °HER2 Status: Positive (+) °AJCC T Category: cT1c °AJCC N Category: cN1 °PR Status: Negative (-) °Intent of Therapy: °Curative Intent, Discussed with Patient °

## 2019-07-05 NOTE — Patient Instructions (Signed)

## 2019-07-05 NOTE — Therapy (Signed)
[place], [place], [address] Phone: [phone]   Fax:  [phone]  Physical Therapy Evaluation  Patient Details  Name: Heidi Costa MRN: [id number] Date of Birth: 25-Apr-1980 Referring Provider (PT): Dr. Rolm Bookbinder   Encounter Date: 07/05/2019  PT End of Session - 07/05/19 1209    Visit Number  1    Number of Visits  2    Date for PT Re-Evaluation  01/04/20    PT Start Time  0942    PT Stop Time  1005    PT Time Calculation (min)  23 min    Activity Tolerance  Patient tolerated treatment well    Behavior During Therapy  [facility] for tasks assessed/performed       Past Medical History:  Diagnosis Date  . Abnormality of gait 08/02/2012  . Anemia    iron taking in the past, not currently  . Anxiety   . Arthritis    spine- cerv.  & arm   . Asthma   . Bulging lumbar disc   . Chronic arthralgias of knees and hips 08/02/2012  . Depression    Pt. took self off Celexa one month ago, states she did n't like how it made her feel .  Marland Kitchen GERD (gastroesophageal reflux disease)   . Heart murmur    pt. been told that this is a fact, no echo or cardiac surveillance in the past   . Hypertension   . Obesity   . Spinal stenosis     Past Surgical History:  Procedure Laterality Date  . ANTERIOR CERVICAL DECOMP/DISCECTOMY FUSION N/A 10/27/2012   Procedure: ANTERIOR CERVICAL DECOMPRESSION/DISCECTOMY FUSION 1 LEVEL;  Surgeon: Faythe Ghee, MD;  Location: [facility];  Service: Neurosurgery;  Laterality: N/A;  ANTERIOR CERVICAL DECOMPRESSION/DISCECTOMY FUSION 1 LEVEL  . CHALAZION EXCISION     L eye   . VAGINAL DELIVERY  2002    There were no vitals filed for this visit.   Subjective Assessment - 07/05/19 1103    Subjective  Patient reports she is here today to be seen by her medical team for her newly diagnosed right breast cancer.    Patient is accompained by:  Family member    Pertinent History   Patient was diagnosed on 06/08/2019 with right grade III invasive ductal carcinoma breast cancer. It measures 2.3 cm and is located in the upper outer quadrant. She has an axillary node that measures 3.7 cm and was biopsied and is positive. Her cancer is ER positive, PR negative and HER2 positive with a Ki67 of 80%. There is also a 5 mm area on her left breast which will be followed on MRI.    Patient Stated Goals  Reduce lymphedema risk and learn post op shoulder ROM HEP    Currently in Pain?  No/denies         [facility] PT Assessment - 07/05/19 0001      Assessment   Medical Diagnosis  Right breast cancer    Referring Provider (PT)  Dr. Rolm Bookbinder    Onset Date/Surgical Date  06/08/19    Hand Dominance  Right    Prior Therapy  none      Precautions   Precautions  Other (comment)    Precaution Comments  active cancer      Restrictions   Weight Bearing Restrictions  No      Balance Screen   Has the patient fallen in the past 6 months  No    Has the patient had a decrease in activity level because of a fear of falling?   No    Is the patient reluctant to leave their home because of a fear of falling?   No      Home Environment   Living Environment  Private residence    Living Arrangements  Spouse/significant other;Children   Husband and 61 y.o. 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    Leisure  She does not exercise      Cognition   Overall Cognitive Status  Within Functional Limits for tasks assessed      Posture/Postural Control   Posture/Postural Control  Postural limitations    Postural Limitations  Rounded Shoulders;Forward head      ROM / Strength   AROM / PROM / Strength  AROM;Strength      AROM   Overall AROM Comments  Cervical AROM is all WNL    AROM Assessment Site  Shoulder    Right/Left Shoulder   Right;Left    Right Shoulder Extension  64 Degrees    Right Shoulder Flexion  159 Degrees    Right Shoulder ABduction  160 Degrees    Right Shoulder Internal Rotation  54 Degrees    Right Shoulder External Rotation  70 Degrees    Left Shoulder Extension  74 Degrees    Left Shoulder Flexion  148 Degrees    Left Shoulder ABduction  167 Degrees    Left Shoulder Internal Rotation  57 Degrees    Left Shoulder External Rotation  74 Degrees      Strength   Overall Strength  Within functional limits for tasks performed        LYMPHEDEMA/ONCOLOGY QUESTIONNAIRE - 07/05/19 1136      Type   Cancer Type  Right breast cancer      Lymphedema Assessments   Lymphedema Assessments  Upper extremities      Right Upper Extremity Lymphedema   10 cm Proximal to Olecranon Process  42.2 cm    Olecranon Process  29.2 cm    10 cm Proximal to Ulnar Styloid Process  26.1 cm    Just Proximal to Ulnar Styloid Process  18.9 cm    Across Hand at [employer]  21.2 cm    At [place] of 2nd Digit  6.6 cm      Left Upper Extremity Lymphedema   10 cm Proximal to Olecranon Process  42 cm    Olecranon Process  29 cm    10 cm Proximal to Ulnar Styloid Process  26.2 cm    Just Proximal to Ulnar Styloid Process  18.9 cm    Across Hand at [employer]  21.4 cm    At [place] of 2nd Digit  6.5 cm          Quick Dash - 07/05/19 0001    Open a tight or new jar  No difficulty    Do heavy household chores (wash walls, wash floors)  No difficulty    Carry a shopping bag or briefcase  No difficulty    Wash your back  No difficulty    Use a knife to cut food  No difficulty    Recreational activities in which you take some force or  impact through your arm, shoulder, or hand (golf, hammering, tennis)  No difficulty    During the past week, to what extent has your arm, shoulder or hand problem interfered with your normal social activities with family, friends, neighbors, or groups?  Not at all    During the past  week, to what extent has your arm, shoulder or hand problem limited your work or other regular daily activities  Not at all    Arm, shoulder, or hand pain.  None    Tingling (pins and needles) in your arm, shoulder, or hand  None    Difficulty Sleeping  No difficulty    DASH Score  0 %        Objective measurements completed on examination: See above findings.       Patient was instructed today in a home exercise program today for post op shoulder range of motion. These included active assist shoulder flexion in sitting, scapular retraction, wall walking with shoulder abduction, and hands behind head external rotation.  She was encouraged to do these twice a day, holding 3 seconds and repeating 5 times when permitted by her physician.           PT Education - 07/05/19 1147    Education Details  Lymphedema risk reduction and post op shoulder ROM HEP    Person(s) Educated  Patient;Spouse    Methods  Explanation;Demonstration;Handout    Comprehension  Returned demonstration;Verbalized understanding          PT Long Term Goals - 07/05/19 1201      PT LONG TERM GOAL #1   Title  Patient will demonstrate she has regained full shoulder ROM and function post operatively compared to baselines.    Time  6    Period  Months    Status  New    Target Date  01/04/20      Breast Clinic Goals - 07/05/19 1201      Patient will be able to verbalize understanding of pertinent lymphedema risk reduction practices relevant to her diagnosis specifically related to skin care.   Time  1    Period  Days    Status  Achieved      Patient will be able to return demonstrate and/or verbalize understanding of the post-op home exercise program related to regaining shoulder range of motion.   Time  1    Period  Days    Status  Achieved      Patient will be able to verbalize understanding of the importance of attending the postoperative After Breast Cancer Class for further lymphedema risk  reduction education and therapeutic exercise.   Time  1    Period  Days    Status  Achieved            Plan - 07/05/19 1150    Clinical Impression Statement  Patient was diagnosed on 06/08/2019 with right grade III invasive ductal carcinoma breast cancer. It measures 2.3 cm and is located in the upper outer quadrant. She has an axillary node that measures 3.7 cm and was biopsied and is positive. Her cancer is ER positive, PR negative and HER2 positive with a Ki67 of 80%. There is also a 5 mm area on her left breast which will be followed on MRI. Her multidisciplinary medical team met prior to her assessments to determine a recommended treatment plan. She is planning to have neoadjuvant chemotherapy followed by a right lumpectomy and targeted axillary lymph node dissection, radiation, and  anti-estrogen therapy. She will benefit from a post op PT assessment to determine needs and an L-Dex screening every 3 months for 2 years after surgery to prevent clinically significant lymphedema.    Stability/Clinical Decision Making  Stable/Uncomplicated    Clinical Decision Making  Low    Rehab Potential  Excellent    PT Frequency  --   Eval and 1 f/u visit   PT Treatment/Interventions  ADLs/Self Care Home Management;Therapeutic exercise;Patient/family education    PT Next Visit Plan  Will reassess 3-4 weeks post op and see pt for L-Dex screenings every 3 months    PT Home Exercise Plan  Post op shoulder ROM HEP    Consulted and Agree with Plan of Care  Patient;Family member/caregiver    Family Member Consulted  Husband       Patient will benefit from skilled therapeutic intervention in order to improve the following deficits and impairments:  Postural dysfunction, Decreased range of motion, Decreased knowledge of precautions, Impaired UE functional use, Pain  Visit Diagnosis: Malignant neoplasm of upper-outer quadrant of right breast in female, estrogen receptor positive ([place]) - Plan: PT plan of  care cert/re-cert  Abnormal posture - Plan: PT plan of care cert/re-cert   Patient will follow up at outpatient cancer rehab 3-4 weeks following surgery.  If the patient requires physical therapy at that time, a specific plan will be dictated and sent to the referring physician for approval. The patient was educated today on appropriate basic range of motion exercises to begin post operatively and the importance of attending the After Breast Cancer class following surgery.  Patient was educated today on lymphedema risk reduction practices as it pertains to recommendations that will benefit the patient immediately following surgery.  She verbalized good understanding.    The patient was assessed using the L-Dex machine today to produce a lymphedema index baseline score. The patient will be reassessed on a regular basis (typically every 3 months) to obtain new L-Dex scores. If the score is > 6.5 points away from his/her baseline score indicating onset of subclinical lymphedema, it will be recommended to wear a compression garment for 4 weeks, 12 hours per day and then be reassessed. If the score continues to be > 6.5 points from baseline at reassessment, we will initiate lymphedema treatment. Assessing in this manner has a 95% rate of preventing clinically significant lymphedema.  Problem List Patient Active Problem List   Diagnosis Date Noted  . Dysphagia 06/23/2019  . Malignant neoplasm of upper-outer quadrant of right breast in female, estrogen receptor positive ([place]) 06/21/2019  . Tobacco consumption 06/06/2019  . Breast mass, right 06/06/2019  . Sensation of change in body temperature 06/06/2019  . Neuropathic pain 06/06/2019  . Bite, insect 10/19/2016  . Swelling of eyelid, left 10/19/2016  . Essential hypertension 08/10/2016  . Disturbance of skin sensation 05/08/2013  . Cervical spondylosis with myelopathy 05/08/2013  . Abnormality of gait 08/02/2012  . Chronic arthralgias of knees and  hips 08/02/2012   Annia Friendly, PT 07/05/19 12:09 PM  [place], [place], [address] Phone: [phone]   Fax:  [phone]  Name: Heidi Costa MRN: [id number] Date of Birth: [birth year]

## 2019-07-05 NOTE — Telephone Encounter (Signed)
Spoke with pt to let her know that her wet prep came back abnormal for BV and sarah has sent her some medication in to take care of it.

## 2019-07-05 NOTE — Progress Notes (Signed)
REFERRING PROVIDER: Nicholas Lose, MD [address] [place],  [place] [address]  PRIMARY PROVIDER:  Wendall Mola, NP  PRIMARY REASON FOR VISIT:  1. Malignant neoplasm of upper-outer quadrant of right breast in female, estrogen receptor positive ([place])   2. Family history of cancer      I connected with Heidi Costa on 07/05/2019 at 11:00 am EDT by Webex video conference and verified that I am speaking with the correct person using two identifiers.   Patient location: [facility] clinic Provider location: [facility] office  HISTORY OF PRESENT ILLNESS:   Heidi Costa, a 39 y.o. female, was seen for a [facility] cancer genetics consultation at the request of Dr. Lindi Adie due to a personal history of young-onset breast cancer.  Heidi Costa presents to clinic today to discuss the possibility of a hereditary predisposition to cancer, genetic testing, and to further clarify her future cancer risks, as well as potential cancer risks for family members.   In March 2021, at the age of 53, Heidi Costa was diagnosed with invasive ductal carcinoma (ER+/PR-/Her2+) of the right breast. The treatment plan includes neoadjuvant chemotherapy, breast conserving surgery if possible with sentinel lymph node biopsy, adjuvant radiation as appropriate, and consideration for neratinib.   CANCER HISTORY:  Oncology History  Malignant neoplasm of upper-outer quadrant of right breast in female, estrogen receptor positive ([place])  06/21/2019 Initial Diagnosis   Patient palpated a painful right breast lump x3 weeks. Mammogram showed a 2.0cm right breast mass at the 10 o'clock position, a 3.6cm enlarged right axillary lymph node, and a 0.5cm left breast mass. Biopsy showed, in the right breast and axilla, IDC, grade 3, HER-2 equivocal by IHC, + by FISH, ER+ 40%, PR - 0%, Ki67 80%, and in the left breast, fibrocystic changes, no malignancy.    07/04/2019 Cancer Staging   Staging form: Breast, AJCC 8th Edition - Clinical stage from  07/04/2019: Stage IIB (cT2, cN1(f), cM0, G3, ER+, PR-, HER2+) - Signed by Nicholas Lose, MD on 07/05/2019   07/12/2019 -  Chemotherapy   The patient had palonosetron (ALOXI) injection 0.25 mg, 0.25 mg, Intravenous,  Once, 0 of 6 cycles pegfilgrastim-jmdb (FULPHILA) injection 6 mg, 6 mg, Subcutaneous,  Once, 0 of 6 cycles CARBOplatin (PARAPLATIN) 700 mg in sodium chloride 0.9 % 250 mL chemo infusion, 700 mg (100 % of original dose 700 mg), Intravenous,  Once, 0 of 6 cycles Dose modification: 700 mg (original dose 700 mg, Cycle 1, Reason: Provider Judgment) DOCEtaxel (TAXOTERE) 170 mg in sodium chloride 0.9 % 250 mL chemo infusion, 75 mg/m2 = 170 mg, Intravenous,  Once, 0 of 6 cycles fosaprepitant (EMEND) 150 mg in sodium chloride 0.9 % 145 mL IVPB, 150 mg, Intravenous,  Once, 0 of 6 cycles pertuzumab (PERJETA) 840 mg in sodium chloride 0.9 % 250 mL chemo infusion, 840 mg, Intravenous, Once, 0 of 6 cycles trastuzumab-dkst (OGIVRI) 903 mg in sodium chloride 0.9 % 250 mL chemo infusion, 8 mg/kg = 903 mg, Intravenous,  Once, 0 of 6 cycles  for chemotherapy treatment.       RISK FACTORS:  Menarche was at age 62.  First live birth at age 27.  OCP use for approximately 2 years.  Ovaries intact: yes.  Hysterectomy: no.  Menopausal status: premenopausal.  HRT use: 0 years. Colonoscopy: n/a. Mammogram within the last year: yes. Number of breast biopsies: 3. Any excessive radiation exposure in the past: no  Past Medical History:  Diagnosis Date   Abnormality of gait 08/02/2012  Anemia    iron taking in the past, not currently   Anxiety    Arthritis    spine- cerv.  & arm    Asthma    Bulging lumbar disc    Chronic arthralgias of knees and hips 08/02/2012   Depression    Pt. took self off Celexa one month ago, states she did n't like how it made her feel .   Family history of cancer    GERD (gastroesophageal reflux disease)    Heart murmur    pt. been told that this is a fact, no  echo or cardiac surveillance in the past    Hypertension    Obesity    Spinal stenosis     Past Surgical History:  Procedure Laterality Date   ANTERIOR CERVICAL DECOMP/DISCECTOMY FUSION N/A 10/27/2012   Procedure: ANTERIOR CERVICAL DECOMPRESSION/DISCECTOMY FUSION 1 LEVEL;  Surgeon: Faythe Ghee, MD;  Location: [facility];  Service: Neurosurgery;  Laterality: N/A;  ANTERIOR CERVICAL DECOMPRESSION/DISCECTOMY FUSION 1 LEVEL   CHALAZION EXCISION     L eye    VAGINAL DELIVERY  2002    Social History   Socioeconomic History   Marital status: Married    Spouse name: Not on file   Number of children: 1   Years of education: associates   Highest education level: Not on file  Occupational History    Employer: OTHER  Tobacco Use   Smoking status: Former Smoker    Packs/day: 1.00    Years: 10.00    Pack years: 10.00    Types: Cigarettes   Smokeless tobacco: Never Used  Substance and Sexual Activity   Alcohol use: Yes    Comment: occas   Drug use: Yes    Types: Marijuana    Comment: occ   Sexual activity: Yes    Birth control/protection: None  Other Topics Concern   Not on file  Social History Narrative   Patient is married with one child.   Patient is right handed.   Patient has a Associate's degree.   Patient drinks 2-3 cups daily.   Social Determinants of Health   Financial Resource Strain:    Difficulty of Paying Living Expenses:   Food Insecurity:    Worried About Charity fundraiser in the Last Year:    Arboriculturist in the Last Year:   Transportation Needs:    Film/video editor (Medical):    Lack of Transportation (Non-Medical):   Physical Activity:    Days of Exercise per Week:    Minutes of Exercise per Session:   Stress:    Feeling of Stress :   Social Connections:    Frequency of Communication with Friends and Family:    Frequency of Social Gatherings with Friends and Family:    Attends Religious Services:     Active Member of Clubs or Organizations:    Attends Music therapist:    Marital Status:      FAMILY HISTORY:  We obtained a detailed, 4-generation family history.  Significant diagnoses are listed below: Family History  Problem Relation Age of Onset   Osteoarthritis Mother    Hypertension Mother    Diabetes Mother    Hypertension Father    Gout Father    Stroke Father 73       embolic   Diabetes Half-Brother    Hypertension Half-Brother    Heart failure Half-Brother 72   Kidney disease Half-Brother    Diabetes Paternal  Grandfather    Cancer Paternal Grandfather 64       breast cancer   Heidi Costa has one son who is 34 and has not had cancer. She has two maternal half-brothers who are in their late 29s, and a paternal half-brother and half-sister who are in their 31s.   Heidi Costa mother is 89 years old and has not had cancer. Her mother was adopted, and thus she has no information about her maternal family history.  Heidi Costa father is 13 years old and has not had cancer. She has one paternal aunt and one paternal uncle who are around 65 years old and have not had cancer. Heidi Costa does not have any paternal cousins. Her paternal grandmother died in her 44s from an unknown cause, and her paternal grandfather died at the age of 70 and had a history of cancer (Heidi Costa is not sure exactly what type) that resulted in the removal of his breast.   Heidi Costa is unaware of previous family history of genetic testing for hereditary cancer risks. Her maternal and paternal ancestors are of Black/African American descent.  There is no reported Ashkenazi Jewish ancestry. There is no known consanguinity.  GENETIC COUNSELING ASSESSMENT: Heidi Costa is a 39 y.o. female with a personal history of young-onset breast cancer with a possible family history of female breast cancer, which is somewhat suggestive of a hereditary cancer syndrome and predisposition to cancer. We,  therefore, discussed and recommended the following at today's visit.   DISCUSSION: We discussed that 5-10% of breast cancer is hereditary, with most cases associated with the BRCA1 and BRCA2 genes.  There are other genes that can be associated with hereditary breast cancer syndromes.  These include ATM, CHEK2, PALB2, etc.  We discussed that testing is beneficial for several reasons, including knowing about other cancer risks, identifying potential screening and risk-reduction options that may be appropriate, and to understand if other family members could be at risk for cancer and allow them to undergo genetic testing.   We reviewed the characteristics, features and inheritance patterns of hereditary cancer syndromes. We also discussed genetic testing, including the appropriate family members to test, the process of testing, insurance coverage and turn-around-time for results. We discussed the implications of a negative, positive and/or variant of uncertain significant result. We recommended Heidi Costa pursue genetic testing for the [facility] Common Hereditary Cancers panel.   The Common Hereditary Cancers Panel offered by [facility] includes sequencing and/or deletion duplication testing of the following 48 genes: APC, ATM, AXIN2, BARD1, BMPR1A, BRCA1, BRCA2, BRIP1, CDH1, CDK4, CDKN2A (p14ARF), CDKN2A (p16INK4a), CHEK2, CTNNA1, DICER1, EPCAM (Deletion/duplication testing only), GREM1 (promoter region deletion/duplication testing only), KIT, MEN1, MLH1, MSH2, MSH3, MSH6, MUTYH, NBN, NF1, NHTL1, PALB2, PDGFRA, PMS2, POLD1, POLE, PTEN, RAD50, RAD51C, RAD51D, RNF43, SDHB, SDHC, SDHD, SMAD4, SMARCA4. STK11, TP53, TSC1, TSC2, and VHL.  The following genes are evaluated for sequence changes only: SDHA and HOXB13 c.251G>A variant only.   Based on Heidi Costa's personal history of young-onset breast cancer, she meets medical criteria for genetic testing. Despite that she meets criteria, she may still have an out of pocket  cost. We discussed that if her out of pocket cost for testing is over $100, the laboratory will reach out and confirm whether she wants to proceed with testing.  If the out of pocket cost of testing is less than $100 she will be billed by the genetic testing laboratory.   PLAN: After considering the risks, benefits, and limitations,  Heidi Costa provided informed consent to pursue genetic testing and the blood sample was sent to [facility] for analysis of the Common Hereditary Cancers Panel. Results should be available within approximately two-three weeks' time, at which point they will be disclosed by telephone to Heidi Costa, as will any additional recommendations warranted by these results. Heidi Costa will receive a summary of her genetic counseling visit and a copy of her results once available. This information will also be available in Epic.   Heidi Costa questions were answered to her satisfaction today. Our contact information was provided should additional questions or concerns arise. Thank you for the referral and allowing [place] to share in the care of your patient.   Clint Guy, [place], [facility] Licensed, Certified Dispensing [email or web address] Phone: [phone]  The patient was seen for a total of 20 minutes in face-to-face genetic counseling.  This patient was discussed with Drs. Magrinat, Lindi Adie and/or Burr Medico who agrees with the above.    _______________________________________________________________________ For Office Staff:  Number of people involved in session: 1 Was an Intern/ student involved with case: no

## 2019-07-06 ENCOUNTER — Telehealth: Payer: Self-pay | Admitting: Hematology and Oncology

## 2019-07-06 NOTE — Telephone Encounter (Signed)
Please have provider sign the 06/23/19 office visit so that the [facility] referral can be completed.  Thank you.

## 2019-07-06 NOTE — Telephone Encounter (Signed)
Scheduled appt per 4/8 sch message - pt is aware of appts

## 2019-07-07 ENCOUNTER — Other Ambulatory Visit: Payer: Self-pay

## 2019-07-07 ENCOUNTER — Ambulatory Visit
Admission: RE | Admit: 2019-07-07 | Discharge: 2019-07-07 | Disposition: A | Discharge status: Home / Self Care | Payer: BC Managed Care – PPO | Source: Ambulatory Visit | Attending: Hematology and Oncology | Admitting: Hematology and Oncology

## 2019-07-07 DIAGNOSIS — C50411 Malignant neoplasm of upper-outer quadrant of right female breast: Secondary | ICD-10-CM

## 2019-07-07 DIAGNOSIS — Z170 Estrogen receptor positive status [ER+]: Secondary | ICD-10-CM

## 2019-07-07 MED ORDER — GADOBUTROL 1 MMOL/ML IV SOLN
10.0000 mL | Freq: Once | INTRAVENOUS | Status: AC | PRN
  Administered 2019-07-07: 10 mL via INTRAVENOUS

## 2019-07-10 ENCOUNTER — Other Ambulatory Visit (HOSPITAL_COMMUNITY)
Admission: RE | Admit: 2019-07-10 | Discharge: 2019-07-10 | Disposition: A | Discharge status: Home / Self Care | Payer: BC Managed Care – PPO | Source: Ambulatory Visit | Attending: General Surgery | Admitting: General Surgery

## 2019-07-10 DIAGNOSIS — Z01812 Encounter for preprocedural laboratory examination: Secondary | ICD-10-CM | POA: Diagnosis present

## 2019-07-10 DIAGNOSIS — Z20822 Contact with and (suspected) exposure to covid-19: Secondary | ICD-10-CM | POA: Diagnosis not present

## 2019-07-10 LAB — SARS CORONAVIRUS 2 (TAT 6-24 HRS): SARS Coronavirus 2: NEGATIVE

## 2019-07-10 NOTE — Progress Notes (Signed)
Pharmacist Chemotherapy Monitoring - Initial Assessment    Anticipated start date: 07/14/19   Regimen:  . Are orders appropriate based on the patient's diagnosis, regimen, and cycle? Yes . Does the plan date match the patient's scheduled date? Yes . Is the sequencing of drugs appropriate? Yes . Are the premedications appropriate for the patient's regimen? Yes . Prior Authorization for treatment is: Pending o If applicable, is the correct biosimilar selected based on the patient's insurance? yes  Organ Function and Labs: Marland Kitchen Are dose adjustments needed based on the patient's renal function, hepatic function, or hematologic function? Yes . Are appropriate labs ordered prior to the start of patient's treatment? Yes . Other organ system assessment, if indicated: trastuzumab: Echo/ MUGA, pertuzumab: Echo/ MUGA and women of childbearing potential: pregnancy status -- premenopausal; no contraception; sexually active, ovaries & uterus intact; LMP 06/30/19.  ECHO sched for 07/12/19 - f/u LVEF . The following baseline labs, if indicated, have been ordered: N/A  Dose Assessment: . Are the drug doses appropriate? Yes . Are the following correct: o Drug concentrations Yes o IV fluid compatible with drug Yes o Administration routes Yes o Timing of therapy Yes . If applicable, does the patient have documented access for treatment and/or plans for port-a-cath placement? Yes - Port sched for 07/13/19 . If applicable, have lifetime cumulative doses been properly documented and assessed? not applicable Lifetime Dose Tracking  No doses have been documented on this patient for the following tracked chemicals: Doxorubicin, Epirubicin, Idarubicin, Daunorubicin, Mitoxantrone, Bleomycin, Oxaliplatin, Carboplatin, Liposomal Doxorubicin  o   Toxicity Monitoring/Prevention: . The patient has the following take home antiemetics prescribed: Ondansetron, Prochlorperazine, Dexamethasone and Lorazepam . The patient has the  following take home medications prescribed: N/A . Medication allergies and previous infusion related reactions, if applicable, have been reviewed and addressed. Yes . The patient's current medication list has been assessed for drug-drug interactions with their chemotherapy regimen. no significant drug-drug interactions were identified on review.  Order Review: . Are the treatment plan orders signed? Yes . Is the patient scheduled to see a provider prior to their treatment? Yes   Need inj appt sched for 07/16/19 for Fulphila - sent sched msg.  I verify that I have reviewed each item in the above checklist and answered each question accordingly.  Kennith Center P 07/10/2019 4:46 PM

## 2019-07-11 NOTE — Progress Notes (Signed)
Patient Care Team: Wendall Mola, NP as PCP - General (Adult Health Nurse Practitioner) Rockwell Germany, RN as Oncology Nurse Navigator Mauro Kaufmann, RN as Oncology Nurse Navigator Rolm Bookbinder, MD as Consulting Physician (General Surgery) Nicholas Lose, MD as Consulting Physician (Hematology and Oncology) Kyung Rudd, MD as Consulting Physician (Radiation Oncology)  DIAGNOSIS:    ICD-10-CM   1. Malignant neoplasm of upper-outer quadrant of right breast in female, estrogen receptor positive ([place])  C50.411    Z17.0     SUMMARY OF ONCOLOGIC HISTORY: Oncology History  Malignant neoplasm of upper-outer quadrant of right breast in female, estrogen receptor positive ([place])  06/21/2019 Initial Diagnosis   Patient palpated a painful right breast lump x3 weeks. Mammogram showed a 2.0cm right breast mass at the 10 o'clock position, a 3.6cm enlarged right axillary lymph node, and a 0.5cm left breast mass. Biopsy showed, in the right breast and axilla, IDC, grade 3, HER-2 equivocal by IHC, + by FISH, ER+ 40%, PR - 0%, Ki67 80%, and in the left breast, fibrocystic changes, no malignancy.    07/04/2019 Cancer Staging   Staging form: Breast, AJCC 8th Edition - Clinical stage from 07/04/2019: Stage IIB (cT2, cN1(f), cM0, G3, ER+, PR-, HER2+) - Signed by Nicholas Lose, MD on 07/05/2019   07/14/2019 -  Chemotherapy   The patient had palonosetron (ALOXI) injection 0.25 mg, 0.25 mg, Intravenous,  Once, 0 of 6 cycles pegfilgrastim-jmdb (FULPHILA) injection 6 mg, 6 mg, Subcutaneous,  Once, 0 of 6 cycles CARBOplatin (PARAPLATIN) 700 mg in sodium chloride 0.9 % 250 mL chemo infusion, 700 mg (100 % of original dose 700 mg), Intravenous,  Once, 0 of 6 cycles Dose modification: 700 mg (original dose 700 mg, Cycle 1, Reason: Provider Judgment) DOCEtaxel (TAXOTERE) 170 mg in sodium chloride 0.9 % 250 mL chemo infusion, 75 mg/m2 = 170 mg, Intravenous,  Once, 0 of 6 cycles fosaprepitant (EMEND) 150 mg in  sodium chloride 0.9 % 145 mL IVPB, 150 mg, Intravenous,  Once, 0 of 6 cycles pertuzumab (PERJETA) 840 mg in sodium chloride 0.9 % 250 mL chemo infusion, 840 mg, Intravenous, Once, 0 of 6 cycles trastuzumab-dkst (OGIVRI) 903 mg in sodium chloride 0.9 % 250 mL chemo infusion, 8 mg/kg = 903 mg, Intravenous,  Once, 0 of 6 cycles  for chemotherapy treatment.      CHIEF COMPLIANT: Cycle 1 TCH Perjeta on 07/14/2019  INTERVAL HISTORY: Heidi Costa is a 39 y.o. with above-mentioned history of right breast cancer currently on neoadjuvant chemotherapy with [place]. Breast MRI on 07/07/19 showed the 2.9cm biopsy-proven malignancy in the right breast and axilla, highly suspicious non-mass enhancement in the lateral right breast spanning 14.5cm, and no left breast malignancy.  She received chemo education and is here today to discuss the results of the breast MRI.  ALLERGIES:  is allergic to aspirin.  MEDICATIONS:  Current Outpatient Medications  Medication Sig Dispense Refill  . acetaminophen (TYLENOL) 500 MG tablet Take 500-1,000 mg by mouth daily as needed for pain.     Marland Kitchen albuterol (PROVENTIL) (2.5 MG/3ML) 0.083% nebulizer solution Take 2.5 mg by nebulization every 6 (six) hours as needed for wheezing or shortness of breath.    Marland Kitchen buPROPion (WELLBUTRIN XL) 150 MG 24 hr tablet 1 tab qam x 7 days then increase to 2 pills (Patient taking differently: Take 150 mg by mouth daily. ) 60 tablet 1  . dexamethasone (DECADRON) 4 MG tablet Take 1 tablet (4 mg total) by mouth daily.  take 1 tablet day before chemo and 1 tablet day after chemo with food 12 tablet 1  . fluticasone (FLOVENT HFA) 44 MCG/ACT inhaler Inhale 1 puff into the lungs 2 (two) times daily as needed (asthma).    . gabapentin (NEURONTIN) 100 MG capsule 1-3 tabs qhs for pain (Patient taking differently: Take 100 mg by mouth at bedtime as needed (pain). 1-3 tabs qhs for pain) 90 capsule 0  . ibuprofen (ADVIL) 200 MG tablet Take 200-400 mg by mouth  every 6 (six) hours as needed for mild pain.    Marland Kitchen lidocaine-prilocaine (EMLA) cream Apply to affected area once 30 g 3  . lisinopril-hydrochlorothiazide (ZESTORETIC) 20-25 MG tablet Take 1 tablet by mouth daily. 30 tablet 1  . LORazepam (ATIVAN) 0.5 MG tablet Take 1 tablet (0.5 mg total) by mouth at bedtime as needed for sleep. 30 tablet 0  . metroNIDAZOLE (FLAGYL) 500 MG tablet Take 500 mg by mouth 3 (three) times daily.    . ondansetron (ZOFRAN) 8 MG tablet Take 1 tablet (8 mg total) by mouth 2 (two) times daily as needed (Nausea or vomiting). Begin 4 days after chemotherapy. 30 tablet 1  . prochlorperazine (COMPAZINE) 10 MG tablet Take 1 tablet (10 mg total) by mouth every 6 (six) hours as needed (Nausea or vomiting). 30 tablet 1   No current facility-administered medications for this visit.    PHYSICAL EXAMINATION: ECOG PERFORMANCE STATUS: 1 - Symptomatic but completely ambulatory  There were no vitals filed for this visit. There were no vitals filed for this visit.  LABORATORY DATA:  I have reviewed the data as listed CMP Latest Ref Rng & Units 07/05/2019 06/06/2019 08/10/2016  Glucose 70 - 99 mg/dL 172(H) 96 97  BUN 6 - 20 mg/dL 8 7 8   Creatinine 0.44 - 1.00 mg/dL 0.96 0.82 0.77  Sodium 135 - 145 mmol/L 141 141 138  Potassium 3.5 - 5.1 mmol/L 3.5 4.5 4.4  Chloride 98 - 111 mmol/L 107 103 107  CO2 22 - 32 mmol/L 22 24 26   Calcium 8.9 - 10.3 mg/dL 8.7(L) 9.3 9.1  Total Protein 6.5 - 8.1 g/dL 6.7 6.5 6.7  Total Bilirubin 0.3 - 1.2 mg/dL 0.3 0.3 0.2  Alkaline Phos 38 - 126 U/L 61 67 46  AST 15 - 41 U/L 15 26 15   ALT 0 - 44 U/L 15 16 12     Lab Results  Component Value Date   WBC 5.7 07/05/2019   HGB 13.4 07/05/2019   HCT 39.4 07/05/2019   MCV 92.7 07/05/2019   PLT 351 07/05/2019   NEUTROABS 2.8 07/05/2019    ASSESSMENT & PLAN:  Malignant neoplasm of upper-outer quadrant of right breast in female, estrogen receptor positive ([place]) 06/21/2019: Patient palpated a painful right  breast lump x3 weeks. Mammogram showed a 2.0cm right breast mass at the 10 o'clock position, a 3.6cm enlarged right axillary lymph node, and a 0.5cm left breast mass. Biopsy showed, in the right breast and axilla (lymph node), IDC, grade 3, HER-2 equivocal by IHC, + by FISH, ER+ 40%, PR - 0%, Ki67 80%,  left breast, fibrocystic changes, no malignancy.  Can be followed.  Treatment plan: 1. Neoadjuvant chemotherapy with TCH Perjeta 6 cycles followed by Herceptin Perjeta versus Kadcyla maintenance for 1 year 2. Followed by breast conserving surgery if possible with sentinel lymph node study 3. Followed by adjuvant radiation therapy if patient had lumpectomy 4.  Consideration for neratinib Genetic counseling ----------------------------------------------------------------------------------------------------------------------------------------------------------- Current treatment: Cycle 1 day one [facility]  Perjeta.  Will be on 07/12/2019 Echocardiogram 07/05/2019 Chemo class completed, chemo consent obtained  Breast MRI on 07/07/19 showed the 2.9cm biopsy-proven malignancy in the right breast and axilla, highly suspicious non-mass enhancement in the lateral right breast spanning 14.5cm, and no left breast malignancy  Plan: Biopsy 2 additional areas of the non-mass enhancement. I discussed with the patient that surgery following neoadjuvant chemotherapy would depend on the degree of response to treatment and whether or not these biopsies are truly malignant.  Return to clinic in 1 week after chemo for toxicity check    No orders of the defined types were placed in this encounter.  The patient has a good understanding of the overall plan. she agrees with it. she will call with any problems that may develop before the next visit here.  Total time spent: 30 mins including face to face time and time spent for planning, charting and coordination of care  Nicholas Lose, MD 07/12/2019  I, Cloyde Reams Dorshimer, am  acting as scribe for Dr. Nicholas Lose.  I have reviewed the above documentation for accuracy and completeness, and I agree with the above.

## 2019-07-12 ENCOUNTER — Ambulatory Visit (HOSPITAL_COMMUNITY)
Admission: RE | Admit: 2019-07-12 | Discharge: 2019-07-12 | Disposition: A | Discharge status: Home / Self Care | Payer: BC Managed Care – PPO | Source: Ambulatory Visit | Attending: Hematology and Oncology | Admitting: Hematology and Oncology

## 2019-07-12 ENCOUNTER — Inpatient Hospital Stay: Payer: BC Managed Care – PPO

## 2019-07-12 ENCOUNTER — Inpatient Hospital Stay (HOSPITAL_BASED_OUTPATIENT_CLINIC_OR_DEPARTMENT_OTHER): Payer: BC Managed Care – PPO | Admitting: Hematology and Oncology

## 2019-07-12 ENCOUNTER — Other Ambulatory Visit: Payer: Self-pay

## 2019-07-12 ENCOUNTER — Encounter (HOSPITAL_COMMUNITY): Payer: Self-pay | Admitting: General Surgery

## 2019-07-12 ENCOUNTER — Encounter: Payer: Self-pay | Admitting: *Deleted

## 2019-07-12 ENCOUNTER — Other Ambulatory Visit: Payer: Self-pay | Admitting: *Deleted

## 2019-07-12 DIAGNOSIS — E785 Hyperlipidemia, unspecified: Secondary | ICD-10-CM | POA: Insufficient documentation

## 2019-07-12 DIAGNOSIS — Z170 Estrogen receptor positive status [ER+]: Secondary | ICD-10-CM | POA: Insufficient documentation

## 2019-07-12 DIAGNOSIS — I10 Essential (primary) hypertension: Secondary | ICD-10-CM | POA: Diagnosis not present

## 2019-07-12 DIAGNOSIS — C50411 Malignant neoplasm of upper-outer quadrant of right female breast: Secondary | ICD-10-CM

## 2019-07-12 DIAGNOSIS — F17210 Nicotine dependence, cigarettes, uncomplicated: Secondary | ICD-10-CM | POA: Diagnosis not present

## 2019-07-12 DIAGNOSIS — D649 Anemia, unspecified: Secondary | ICD-10-CM | POA: Diagnosis not present

## 2019-07-12 DIAGNOSIS — Z01818 Encounter for other preprocedural examination: Secondary | ICD-10-CM | POA: Diagnosis present

## 2019-07-12 LAB — ECHOCARDIOGRAM COMPLETE
Height: 65 in
Weight: 3968 oz

## 2019-07-12 NOTE — Progress Notes (Signed)
Patient denies shortness of breath, fever, cough and chest pain.  PCP - Jens Som, NP Cardiologist - n/a Oncology - Dr Nicholas Lose  Chest x-ray - n/a EKG - DOS 07/13/19 Stress Test - n/a ECHO -  Scheduled today - 07/12/19 Cardiac Cath - n/a  ERAS: Clears til 5:45 am DOS, no drink.  Anesthesia review: No  STOP now taking any Aspirin (unless otherwise instructed by your surgeon), Aleve, Naproxen, Ibuprofen, Motrin, Advil, Goody's, BC's, all herbal medications, fish oil, and all vitamins.   Coronavirus Screening Covid test on 07/10/19 was negative.  Patient verbalized understanding of instructions that were given via phone.

## 2019-07-12 NOTE — Progress Notes (Signed)
  Echocardiogram 2D Echocardiogram has been performed.  Darlina Sicilian M 07/12/2019, 12:44 PM

## 2019-07-12 NOTE — Assessment & Plan Note (Signed)
06/21/2019: Patient palpated a painful right breast lump x3 weeks. Mammogram showed a 2.0cm right breast mass at the 10 o'clock position, a 3.6cm enlarged right axillary lymph node, and a 0.5cm left breast mass. Biopsy showed, in the right breast and axilla (lymph node), IDC, grade 3, HER-2 equivocal by IHC, + by FISH, ER+ 40%, PR - 0%, Ki67 80%,  left breast, fibrocystic changes, no malignancy.  Can be followed.  Treatment plan: 1. Neoadjuvant chemotherapy with TCH Perjeta 6 cycles followed by Herceptin Perjeta versus Kadcyla maintenance for 1 year 2. Followed by breast conserving surgery if possible with sentinel lymph node study 3. Followed by adjuvant radiation therapy if patient had lumpectomy 4.  Consideration for neratinib Genetic counseling ----------------------------------------------------------------------------------------------------------------------------------------------------------- Current treatment: Cycle 1 day one [place] Perjeta. Echocardiogram 07/05/2019 Chemo class completed, chemo consent obtained Labs reviewed  Return to clinic in 1 week for toxicity check

## 2019-07-13 ENCOUNTER — Other Ambulatory Visit: Payer: Self-pay

## 2019-07-13 ENCOUNTER — Ambulatory Visit (HOSPITAL_COMMUNITY): Payer: BC Managed Care – PPO | Admitting: Anesthesiology

## 2019-07-13 ENCOUNTER — Encounter: Payer: Self-pay | Admitting: Hematology and Oncology

## 2019-07-13 ENCOUNTER — Ambulatory Visit (HOSPITAL_COMMUNITY): Payer: BC Managed Care – PPO

## 2019-07-13 ENCOUNTER — Encounter (HOSPITAL_COMMUNITY)
Admission: RE | Disposition: A | Discharge status: Home / Self Care | Payer: Self-pay | Source: Home / Self Care | Attending: General Surgery

## 2019-07-13 ENCOUNTER — Ambulatory Visit (HOSPITAL_COMMUNITY)
Admission: RE | Admit: 2019-07-13 | Discharge: 2019-07-13 | Disposition: A | Discharge status: Home / Self Care | Payer: BC Managed Care – PPO | Attending: General Surgery | Admitting: General Surgery

## 2019-07-13 ENCOUNTER — Encounter (HOSPITAL_COMMUNITY): Payer: Self-pay | Admitting: General Surgery

## 2019-07-13 DIAGNOSIS — Z95828 Presence of other vascular implants and grafts: Secondary | ICD-10-CM

## 2019-07-13 DIAGNOSIS — C50911 Malignant neoplasm of unspecified site of right female breast: Secondary | ICD-10-CM | POA: Diagnosis not present

## 2019-07-13 DIAGNOSIS — Z419 Encounter for procedure for purposes other than remedying health state, unspecified: Secondary | ICD-10-CM

## 2019-07-13 HISTORY — DX: Malignant (primary) neoplasm, unspecified: C80.1

## 2019-07-13 HISTORY — DX: Hyperlipidemia, unspecified: E78.5

## 2019-07-13 HISTORY — PX: PORTACATH PLACEMENT: SHX2246

## 2019-07-13 LAB — POCT PREGNANCY, URINE: Preg Test, Ur: NEGATIVE

## 2019-07-13 SURGERY — INSERTION, TUNNELED CENTRAL VENOUS DEVICE, WITH PORT
Anesthesia: General | Site: Chest | Laterality: Left

## 2019-07-13 MED ORDER — OXYCODONE HCL 5 MG/5ML PO SOLN: 5.0000 mg | Freq: Once | ORAL | Status: AC | PRN

## 2019-07-13 MED ORDER — ACETAMINOPHEN 650 MG RE SUPP: 650.0000 mg | RECTAL | Status: DC | PRN

## 2019-07-13 MED ORDER — LIDOCAINE 2% (20 MG/ML) 5 ML SYRINGE
INTRAMUSCULAR | Status: DC | PRN
  Administered 2019-07-13: 60 mg via INTRAVENOUS

## 2019-07-13 MED ORDER — SODIUM CHLORIDE 0.9 % IV SOLN: 250.0000 mL | INTRAVENOUS | Status: DC | PRN

## 2019-07-13 MED ORDER — ACETAMINOPHEN 10 MG/ML IV SOLN
INTRAVENOUS | Status: AC
  Filled 2019-07-13: qty 100

## 2019-07-13 MED ORDER — ACETAMINOPHEN 325 MG PO TABS: 650.0000 mg | ORAL_TABLET | ORAL | Status: DC | PRN

## 2019-07-13 MED ORDER — PROPOFOL 10 MG/ML IV BOLUS
INTRAVENOUS | Status: AC
  Filled 2019-07-13: qty 20

## 2019-07-13 MED ORDER — FENTANYL CITRATE (PF) 250 MCG/5ML IJ SOLN
INTRAMUSCULAR | Status: AC
  Filled 2019-07-13: qty 5

## 2019-07-13 MED ORDER — LACTATED RINGERS IV SOLN: INTRAVENOUS | Status: DC | PRN

## 2019-07-13 MED ORDER — SODIUM CHLORIDE 0.9% FLUSH: 3.0000 mL | INTRAVENOUS | Status: DC | PRN

## 2019-07-13 MED ORDER — 0.9 % SODIUM CHLORIDE (POUR BTL) OPTIME
TOPICAL | Status: DC | PRN
  Administered 2019-07-13: 1000 mL

## 2019-07-13 MED ORDER — CEFAZOLIN SODIUM-DEXTROSE 2-4 GM/100ML-% IV SOLN
2.0000 g | INTRAVENOUS | Status: DC
  Filled 2019-07-13: qty 100

## 2019-07-13 MED ORDER — BUPIVACAINE HCL (PF) 0.25 % IJ SOLN
INTRAMUSCULAR | Status: AC
  Filled 2019-07-13: qty 30

## 2019-07-13 MED ORDER — PROPOFOL 10 MG/ML IV BOLUS
INTRAVENOUS | Status: DC | PRN
  Administered 2019-07-13: 20 mg via INTRAVENOUS
  Administered 2019-07-13: 160 mg via INTRAVENOUS

## 2019-07-13 MED ORDER — ACETAMINOPHEN 10 MG/ML IV SOLN
1000.0000 mg | Freq: Once | INTRAVENOUS | Status: DC | PRN
  Administered 2019-07-13: 1000 mg via INTRAVENOUS

## 2019-07-13 MED ORDER — OXYCODONE HCL 5 MG PO TABS
5.0000 mg | ORAL_TABLET | Freq: Once | ORAL | Status: AC | PRN
  Administered 2019-07-13: 5 mg via ORAL

## 2019-07-13 MED ORDER — PHENYLEPHRINE HCL (PRESSORS) 10 MG/ML IV SOLN
INTRAVENOUS | Status: DC | PRN
  Administered 2019-07-13: 40 ug via INTRAVENOUS

## 2019-07-13 MED ORDER — BUPIVACAINE HCL 0.25 % IJ SOLN
INTRAMUSCULAR | Status: DC | PRN
  Administered 2019-07-13: 7 mL

## 2019-07-13 MED ORDER — ACETAMINOPHEN 160 MG/5ML PO SOLN: 1000.0000 mg | Freq: Once | ORAL | Status: DC | PRN

## 2019-07-13 MED ORDER — FENTANYL CITRATE (PF) 100 MCG/2ML IJ SOLN
INTRAMUSCULAR | Status: DC | PRN
  Administered 2019-07-13 (×4): 25 ug via INTRAVENOUS

## 2019-07-13 MED ORDER — DEXAMETHASONE SODIUM PHOSPHATE 10 MG/ML IJ SOLN
INTRAMUSCULAR | Status: DC | PRN
  Administered 2019-07-13: 10 mg via INTRAVENOUS

## 2019-07-13 MED ORDER — ACETAMINOPHEN 500 MG PO TABS: 1000.0000 mg | ORAL_TABLET | Freq: Once | ORAL | Status: DC | PRN

## 2019-07-13 MED ORDER — ENSURE PRE-SURGERY PO LIQD: 296.0000 mL | Freq: Once | ORAL | Status: DC

## 2019-07-13 MED ORDER — SODIUM CHLORIDE 0.9 % IV SOLN
INTRAVENOUS | Status: AC
  Filled 2019-07-13: qty 1.2

## 2019-07-13 MED ORDER — CEFAZOLIN SODIUM-DEXTROSE 2-3 GM-%(50ML) IV SOLR
INTRAVENOUS | Status: DC | PRN
  Administered 2019-07-13: 2 g via INTRAVENOUS

## 2019-07-13 MED ORDER — FENTANYL CITRATE (PF) 100 MCG/2ML IJ SOLN
25.0000 ug | INTRAMUSCULAR | Status: DC | PRN
  Administered 2019-07-13 (×2): 50 ug via INTRAVENOUS

## 2019-07-13 MED ORDER — OXYCODONE HCL 5 MG PO TABS: 5.0000 mg | ORAL_TABLET | ORAL | Status: DC | PRN

## 2019-07-13 MED ORDER — OXYCODONE HCL 5 MG PO TABS: 5.0000 mg | ORAL_TABLET | Freq: Four times a day (QID) | ORAL | 0 refills | Status: DC | PRN

## 2019-07-13 MED ORDER — HEPARIN SOD (PORK) LOCK FLUSH 100 UNIT/ML IV SOLN
INTRAVENOUS | Status: AC
  Filled 2019-07-13: qty 5

## 2019-07-13 MED ORDER — OXYCODONE HCL 5 MG PO TABS
ORAL_TABLET | ORAL | Status: AC
  Filled 2019-07-13: qty 1

## 2019-07-13 MED ORDER — MIDAZOLAM HCL 2 MG/2ML IJ SOLN
INTRAMUSCULAR | Status: AC
  Filled 2019-07-13: qty 2

## 2019-07-13 MED ORDER — MIDAZOLAM HCL 5 MG/5ML IJ SOLN
INTRAMUSCULAR | Status: DC | PRN
  Administered 2019-07-13: 2 mg via INTRAVENOUS

## 2019-07-13 MED ORDER — FENTANYL CITRATE (PF) 100 MCG/2ML IJ SOLN
INTRAMUSCULAR | Status: AC
  Filled 2019-07-13: qty 2

## 2019-07-13 MED ORDER — LACTATED RINGERS IV SOLN: INTRAVENOUS | Status: DC

## 2019-07-13 MED ORDER — ACETAMINOPHEN 500 MG PO TABS
1000.0000 mg | ORAL_TABLET | ORAL | Status: AC
  Administered 2019-07-13: 1000 mg via ORAL
  Filled 2019-07-13: qty 2

## 2019-07-13 SURGICAL SUPPLY — 44 items
ADH SKN CLS APL DERMABOND .7 (GAUZE/BANDAGES/DRESSINGS) ×1
BAG DECANTER FOR FLEXI CONT (MISCELLANEOUS) ×3 IMPLANT
CHLORAPREP W/TINT 10.5 ML (MISCELLANEOUS) ×3 IMPLANT
COVER SURGICAL LIGHT HANDLE (MISCELLANEOUS) ×3 IMPLANT
COVER TRANSDUCER ULTRASND GEL (DRAPE) ×3 IMPLANT
COVER WAND RF STERILE (DRAPES) ×3 IMPLANT
DECANTER SPIKE VIAL GLASS SM (MISCELLANEOUS) ×3 IMPLANT
DERMABOND ADVANCED (GAUZE/BANDAGES/DRESSINGS) ×2
DERMABOND ADVANCED .7 DNX12 (GAUZE/BANDAGES/DRESSINGS) ×1 IMPLANT
DRAPE C-ARM 42X120 X-RAY (DRAPES) ×3 IMPLANT
DRAPE CHEST BREAST 15X10 FENES (DRAPES) ×3 IMPLANT
DRSG TEGADERM 4X4.75 (GAUZE/BANDAGES/DRESSINGS) ×4 IMPLANT
ELECT CAUTERY BLADE 6.4 (BLADE) ×3 IMPLANT
ELECT REM PT RETURN 9FT ADLT (ELECTROSURGICAL) ×3
ELECTRODE REM PT RTRN 9FT ADLT (ELECTROSURGICAL) ×1 IMPLANT
GAUZE 4X4 16PLY RFD (DISPOSABLE) ×3 IMPLANT
GAUZE SPONGE 4X4 12PLY STRL (GAUZE/BANDAGES/DRESSINGS) ×2 IMPLANT
GEL ULTRASOUND 20GR AQUASONIC (MISCELLANEOUS) ×3 IMPLANT
GLOVE BIO SURGEON STRL SZ7 (GLOVE) ×3 IMPLANT
GLOVE BIOGEL PI IND STRL 7.5 (GLOVE) ×1 IMPLANT
GLOVE BIOGEL PI INDICATOR 7.5 (GLOVE) ×2
GOWN STRL REUS W/ TWL LRG LVL3 (GOWN DISPOSABLE) ×2 IMPLANT
GOWN STRL REUS W/TWL LRG LVL3 (GOWN DISPOSABLE) ×6
INTRODUCER COOK 11FR (CATHETERS) IMPLANT
KIT BASIN OR (CUSTOM PROCEDURE TRAY) ×3 IMPLANT
KIT PORT POWER 8FR ISP CVUE (Port) ×3 IMPLANT
KIT TURNOVER KIT B (KITS) ×3 IMPLANT
NS IRRIG 1000ML POUR BTL (IV SOLUTION) ×3 IMPLANT
PAD ARMBOARD 7.5X6 YLW CONV (MISCELLANEOUS) ×6 IMPLANT
PENCIL BUTTON HOLSTER BLD 10FT (ELECTRODE) ×3 IMPLANT
POSITIONER HEAD DONUT 9IN (MISCELLANEOUS) ×3 IMPLANT
SET INTRODUCER 12FR PACEMAKER (INTRODUCER) IMPLANT
SET SHEATH INTRODUCER 10FR (MISCELLANEOUS) IMPLANT
SHEATH COOK PEEL AWAY SET 9F (SHEATH) IMPLANT
SUT MNCRL AB 4-0 PS2 18 (SUTURE) ×3 IMPLANT
SUT PROLENE 2 0 SH DA (SUTURE) ×3 IMPLANT
SUT SILK 2 0 (SUTURE)
SUT SILK 2-0 18XBRD TIE 12 (SUTURE) IMPLANT
SUT VIC AB 3-0 SH 27 (SUTURE) ×3
SUT VIC AB 3-0 SH 27XBRD (SUTURE) ×1 IMPLANT
SYR 5ML LUER SLIP (SYRINGE) ×3 IMPLANT
TOWEL GREEN STERILE (TOWEL DISPOSABLE) ×3 IMPLANT
TOWEL GREEN STERILE FF (TOWEL DISPOSABLE) ×3 IMPLANT
TRAY LAPAROSCOPIC MC (CUSTOM PROCEDURE TRAY) ×3 IMPLANT

## 2019-07-13 NOTE — Interval H&P Note (Signed)
History and Physical Interval Note:  07/13/2019 8:29 AM  Heidi Costa  has presented today for surgery, with the diagnosis of BREAST CANCER.  The various methods of treatment have been discussed with the patient and family. After consideration of risks, benefits and other options for treatment, the patient has consented to  Procedure(s): INSERTION PORT-A-CATH WITH ULTRASOUND (N/A) as a surgical intervention.  The patient's history has been reviewed, patient examined, no change in status, stable for surgery.  I have reviewed the patient's chart and labs.  Questions were answered to the patient's satisfaction.     Rolm Bookbinder

## 2019-07-13 NOTE — Anesthesia Preprocedure Evaluation (Addendum)
Anesthesia Evaluation  Patient identified by MRN, date of birth, ID band Patient awake    Reviewed: Allergy & Precautions, NPO status , Patient's Chart, lab work & pertinent test results  History of Anesthesia Complications Negative for: history of anesthetic complications  Airway Mallampati: III  TM Distance: >3 FB Neck ROM: Full    Dental  (+) Dental Advisory Given, Teeth Intact   Pulmonary asthma , neg recent URI, Patient abstained from smoking., former smoker,    breath sounds clear to auscultation       Cardiovascular hypertension, Pt. on medications (-) angina(-) Past MI and (-) CHF  Rhythm:Regular   1. Left ventricular ejection fraction, by estimation, is 60 to 65%. The  left ventricle has normal function. The left ventricle has no regional  wall motion abnormalities. Left ventricular diastolic parameters were  normal. The average left ventricular  global longitudinal strain is -16.9 %.  2. Right ventricular systolic function is normal. The right ventricular  size is normal. Tricuspid regurgitation signal is inadequate for assessing  PA pressure.  3. The mitral valve is normal in structure. No evidence of mitral valve  regurgitation. No evidence of mitral stenosis.  4. The aortic valve is normal in structure. Aortic valve regurgitation is  not visualized. No aortic stenosis is present.    Neuro/Psych PSYCHIATRIC DISORDERS Anxiety Depression negative neurological ROS     GI/Hepatic Neg liver ROS,   Endo/Other  neg diabetesMorbid obesity  Renal/GU negative Renal ROS     Musculoskeletal  (+) Arthritis ,   Abdominal   Peds  Hematology negative hematology ROS (+)   Anesthesia Other Findings   Reproductive/Obstetrics                            Anesthesia Physical Anesthesia Plan  ASA: III  Anesthesia Plan: General   Post-op Pain Management:    Induction: Intravenous  PONV  Risk Score and Plan: 3 and Ondansetron and Dexamethasone  Airway Management Planned: Oral ETT and LMA  Additional Equipment: None  Intra-op Plan:   Post-operative Plan: Extubation in OR  Informed Consent: I have reviewed the patients History and Physical, chart, labs and discussed the procedure including the risks, benefits and alternatives for the proposed anesthesia with the patient or authorized representative who has indicated his/her understanding and acceptance.     Dental advisory given  Plan Discussed with: CRNA and Surgeon  Anesthesia Plan Comments:         Anesthesia Quick Evaluation

## 2019-07-13 NOTE — Progress Notes (Signed)
Called pt to introduce myself as her Financial Resource Specialist, discuss copay assistance and the [employer] grant.  I left a msg requesting she return my call if she's interested in applying for the grants. 

## 2019-07-13 NOTE — Anesthesia Procedure Notes (Signed)
Procedure Name: LMA Insertion Date/Time: 07/13/2019 8:43 AM Performed by: Neldon Newport, CRNA Pre-anesthesia Checklist: Timeout performed, Patient being monitored, Suction available, Emergency Drugs available and Patient identified Patient Re-evaluated:Patient Re-evaluated prior to induction Oxygen Delivery Method: Circle system utilized Preoxygenation: Pre-oxygenation with 100% oxygen Induction Type: IV induction LMA: LMA inserted LMA Size: 4.0 Tube type: Oral Number of attempts: 1 Placement Confirmation: positive ETCO2 and breath sounds checked- equal and bilateral Tube secured with: Tape Dental Injury: Teeth and Oropharynx as per pre-operative assessment

## 2019-07-13 NOTE — Op Note (Signed)
Preoperative diagnosis:clinical stage II her 2 positive right breast cancer Postoperative diagnosis: saa Procedure: Left ij port placement with US guidance Surgeon: Dr Serita Grammes EBL: minimal Anesthesia: general  Complications none Drains none Specimens:none Sponge and needle count correct times two dispo to recovery stable  Indications: 89 yof who presents with palpable mass in right axilla for past month. she has family history she thinks of breast cancer in a grandfather who had a breast removed. she has no nipple dc. she underwent mm that shows on right a 2.3 cm mass in uoq as well as a 3.7 cm single abnormal axillary node (this is mass she felt). there is also a 5 mm left breast abnormality. the left breast biopsy is discordant and Dr Luan Pulling doesnt think can likely biopsy this. she thinks reasonable to follow this as pretest probability so low. the right ax node is positive for idc and the breast mass is a grade III IDC that is 40% er weak staining/ pr neg, her 2 positive and Ki is 80%.   Procedure:After informed consent was obtained the patient was taken to the operating room. She was given antibiotics. SCDs were placed. She was placed under general anesthesia without complication. She was prepped and draped in the standard sterile surgical fashion. A surgical timeout was then performed.  I identified the internal jugular vein on the left side with the ultrasound. I made a small nick in the skin. I accessed the internal jugular vein with the needle under ultrasound guidance. I passed the wire. The wire was confirmed to be in position with fluoroscopy.The wire was in the vein by ultrasound as well.I then infiltrated Marcaine below the clavicle on the left side. Imade anincision and developed a subcutaneous pocket for the port. I then tunneled between the port site as well as the insertion site. I brought the line through this. I then placed the dilator under  fluoroscopic guidance over the wire. I removedthe wire andthe dilator. I then placed the line into the sheath. The sheath was then removed. I pulled the line back to be in the distal vena cava. I then hooked this up to the port. This was placed in the pocket and sutured in place with 2-0 Prolene suture. I then closed this with 3-0 Vicryl and 4-0 Monocryl. Glue was placed. I accessed this. It withdrew blood and flushed easily. I packed it with heparin.I placed a dressing on it as well.

## 2019-07-13 NOTE — Transfer of Care (Signed)
Immediate Anesthesia Transfer of Care Note  Patient: Heidi Costa  Procedure(s) Performed: INSERTION PORT-A-CATH WITH ULTRASOUND (Left Chest)  Patient Location: PACU  Anesthesia Type:General  Level of Consciousness: awake, alert  and oriented  Airway & Oxygen Therapy: Patient Spontanous Breathing and Patient connected to nasal cannula oxygen  Post-op Assessment: Report given to RN and Post -op Vital signs reviewed and stable  Post vital signs: Reviewed and stable  Last Vitals:  Vitals Value Taken Time  BP    Temp    Pulse    Resp 20 07/13/19 0945  SpO2    Vitals shown include unvalidated device data.  Last Pain:  Vitals:   07/13/19 0725  TempSrc:   PainSc: 0-No pain      Patients Stated Pain Goal: 3 ([id number])  Complications: No apparent anesthesia complications

## 2019-07-13 NOTE — H&P (Signed)
   75 yof who presents with palpable mass in right axilla for past month. she has family history she thinks of breast cancer in a grandfather who had a breast removed. she has no nipple dc. she underwent mm that shows on right a 2.3 cm mass in uoq as well as a 3.7 cm single abnormal axillary node (this is mass she felt). there is also a 5 mm left breast abnormality. the left breast biopsy is discordant and Dr Luan Pulling doesnt think can likely biopsy this. she thinks reasonable to follow this as pretest probability so low. the right ax node is positive for idc and the breast mass is a grade III IDC that is 40% er weak staining/ pr neg, her 2 positive and Ki is 80%. 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 her husband is here today with her to discuss options   Past Surgical History Rolm Bookbinder, MD; 07/05/2019 1:54 PM) Spinal Fusion - Neck   Medication History Conni Slipper, RN; 07/05/2019 7:57 AM) Medications Reconciled  Social History Rolm Bookbinder, MD; 07/05/2019 1:54 PM) Current tobacco use  Former smoker. Illicit drug use  Uses marijuana.  Family History Rolm Bookbinder, MD; 07/05/2019 1:54 PM) Breast Cancer  Maternal Grandfather.    Physical Exam Rolm Bookbinder MD; 07/05/2019 2:00 PM) General Mental Status-Alert. Orientation-Oriented X3.  Breast Nipples-No Discharge. Note: 2-3 cm uoq right breast mass mobile nontender with some hematoma   Lymphatic Head & Neck  General Head & Neck Lymphatics: Bilateral - Description - Normal. Note: no [place] adenopathy no left axillary adenopathy 4 cm right axillary mass mobile nontender     Assessment & Plan Rolm Bookbinder MD; 07/05/2019 2:04 PM) BREAST CANCER METASTASIZED TO AXILLARY LYMPH NODE (C50.919) Story: genetic testing, MRI, primary systemic therapy, port placement We discussed the staging and pathophysiology of breast cancer. We discussed all of the different options for  treatment for breast cancer including surgery, chemotherapy, radiation therapy, Herceptin, and antiestrogen therapy. we discussed rationale for primary systemic therapy to downstage tumor, await genetics etc and to assess tumor for response for continuing therapy with RCB. she is agreeable to proceeding with port placement which we discussed today in detail. I will plan on doing this next week. eventually if genetics negative and MRI fine (doing this mostly to follow response to primary systemic therapy) she is candidate for lumpectomy and would be interested in a reduction lumpectomy with plastic surgery as well due to breast size and back pain. she has actually seen plastics before for this. she could also hopefully have a TAD later. We discussed surgery timing to be within 30 days of stopping chemotherapy Impression: I spent 55 minutes discussion with radiology, pathology, reviewing imaging, discussing with oncology and then seeing/evaluating patient

## 2019-07-13 NOTE — Discharge Instructions (Signed)
    PORT-A-CATH: POST OP INSTRUCTIONS  Always review your discharge instruction sheet given to you by the facility where your surgery was performed.   1. A prescription for pain medication may be given to you upon discharge. Take your pain medication as prescribed, if needed. If narcotic pain medicine is not needed, then you make take acetaminophen (Tylenol) or ibuprofen (Advil) as needed.  2. Take your usually prescribed medications unless otherwise directed. 3. If you need a refill on your pain medication, please contact our office. All narcotic pain medicine now requires a paper prescription.  Phoned in and fax refills are no longer allowed by law.  Prescriptions will not be filled after 5 pm or on weekends.  4. You should follow a light diet for the remainder of the day after your procedure. 5. Most patients will experience some mild swelling and/or bruising in the area of the incision. It may take several days to resolve. 6. It is common to experience some constipation if taking pain medication after surgery. Increasing fluid intake and taking a stool softener (such as Colace) will usually help or prevent this problem from occurring. A mild laxative (Milk of Magnesia or Miralax) should be taken according to package directions if there are no bowel movements after 48 hours.  7. Unless discharge instructions indicate otherwise, you may remove your bandages 48 hours after surgery, and you may shower at that time. You may have steri-strips (small white skin tapes) in place directly over the incision.  These strips should be left on the skin for 7-10 days.  If your surgeon used Dermabond (skin glue) on the incision, you may shower in 24 hours.  The glue will flake off over the next 2-3 weeks.  8. If your port is left accessed at the end of surgery (needle left in port), the dressing cannot get wet and should only by changed by a healthcare professional. When the port is no longer accessed (when the  needle has been removed), follow step 7.   9. ACTIVITIES:  Limit activity involving your arms for the next 72 hours. Do no strenuous exercise or activity for 1 week. You may drive when you are no longer taking prescription pain medication, you can comfortably wear a seatbelt, and you can maneuver your car. 10.You may need to see your doctor in the office for a follow-up appointment.  Please       check with your doctor.  11.When you receive a new Port-a-Cath, you will get a product guide and        ID card.  Please keep them in case you need them.  WHEN TO CALL YOUR DOCTOR ([phone]): 1. Fever over 101.0 2. Chills 3. Continued bleeding from incision 4. Increased redness and tenderness at the site 5. Shortness of breath, difficulty breathing   The clinic staff is available to answer your questions during regular business hours. Please don't hesitate to call and ask to speak to one of the nurses or medical assistants for clinical concerns. If you have a medical emergency, go to the nearest emergency room or call 911.  A surgeon from [facility] is always on call at the hospital.     For further information, please visit [email or web address]      

## 2019-07-14 ENCOUNTER — Encounter: Payer: Self-pay | Admitting: *Deleted

## 2019-07-14 ENCOUNTER — Inpatient Hospital Stay: Payer: BC Managed Care – PPO

## 2019-07-14 ENCOUNTER — Other Ambulatory Visit: Payer: Self-pay | Admitting: Hematology and Oncology

## 2019-07-14 ENCOUNTER — Other Ambulatory Visit: Payer: Self-pay

## 2019-07-14 VITALS — BP 125/69 | HR 60 | Temp 98.2°F | Resp 20

## 2019-07-14 DIAGNOSIS — C50411 Malignant neoplasm of upper-outer quadrant of right female breast: Secondary | ICD-10-CM

## 2019-07-14 DIAGNOSIS — Z95828 Presence of other vascular implants and grafts: Secondary | ICD-10-CM

## 2019-07-14 LAB — CBC WITH DIFFERENTIAL (CANCER CENTER ONLY)
Abs Immature Granulocytes: 0.04 10*3/uL (ref 0.00–0.07)
Basophils Absolute: 0 10*3/uL (ref 0.0–0.1)
Basophils Relative: 0 %
Eosinophils Absolute: 0 10*3/uL (ref 0.0–0.5)
Eosinophils Relative: 0 %
HCT: 37.9 % (ref 36.0–46.0)
Hemoglobin: 12.9 g/dL (ref 12.0–15.0)
Immature Granulocytes: 0 %
Lymphocytes Relative: 18 %
Lymphs Abs: 2.6 10*3/uL (ref 0.7–4.0)
MCH: 32 pg (ref 26.0–34.0)
MCHC: 34 g/dL (ref 30.0–36.0)
MCV: 94 fL (ref 80.0–100.0)
Monocytes Absolute: 0.7 10*3/uL (ref 0.1–1.0)
Monocytes Relative: 5 %
Neutro Abs: 10.9 10*3/uL — ABNORMAL HIGH (ref 1.7–7.7)
Neutrophils Relative %: 77 %
Platelet Count: 336 10*3/uL (ref 150–400)
RBC: 4.03 MIL/uL (ref 3.87–5.11)
RDW: 13 % (ref 11.5–15.5)
WBC Count: 14.2 10*3/uL — ABNORMAL HIGH (ref 4.0–10.5)
nRBC: 0 % (ref 0.0–0.2)

## 2019-07-14 LAB — CMP (CANCER CENTER ONLY)
ALT: 16 U/L (ref 0–44)
AST: 18 U/L (ref 15–41)
Albumin: 3.6 g/dL (ref 3.5–5.0)
Alkaline Phosphatase: 51 U/L (ref 38–126)
Anion gap: 10 (ref 5–15)
BUN: 9 mg/dL (ref 6–20)
CO2: 25 mmol/L (ref 22–32)
Calcium: 8.6 mg/dL — ABNORMAL LOW (ref 8.9–10.3)
Chloride: 105 mmol/L (ref 98–111)
Creatinine: 0.87 mg/dL (ref 0.44–1.00)
GFR, Est AFR Am: >60 mL/min (ref >60)
GFR, Estimated: >60 mL/min (ref >60)
Glucose, Bld: 134 mg/dL — ABNORMAL HIGH (ref 70–99)
Potassium: 3.5 mmol/L (ref 3.5–5.1)
Sodium: 140 mmol/L (ref 135–145)
Total Bilirubin: 0.4 mg/dL (ref 0.3–1.2)
Total Protein: 7 g/dL (ref 6.5–8.1)

## 2019-07-14 LAB — PREGNANCY, URINE: Preg Test, Ur: NEGATIVE

## 2019-07-14 MED ORDER — SODIUM CHLORIDE 0.9% FLUSH
10.0000 mL | INTRAVENOUS | Status: DC | PRN
  Administered 2019-07-14 (×2): 10 mL
  Filled 2019-07-14: qty 10

## 2019-07-14 MED ORDER — SODIUM CHLORIDE 0.9 % IV SOLN
150.0000 mg | Freq: Once | INTRAVENOUS | Status: AC
  Administered 2019-07-14: 150 mg via INTRAVENOUS
  Filled 2019-07-14: qty 150

## 2019-07-14 MED ORDER — SODIUM CHLORIDE 0.9 % IV SOLN
700.0000 mg | Freq: Once | INTRAVENOUS | Status: AC
  Administered 2019-07-14: 17:00:00 700 mg via INTRAVENOUS
  Filled 2019-07-14: qty 70

## 2019-07-14 MED ORDER — TRASTUZUMAB-DKST CHEMO 150 MG IV SOLR
900.0000 mg | Freq: Once | INTRAVENOUS | Status: AC
  Administered 2019-07-14: 10:00:00 900 mg via INTRAVENOUS
  Filled 2019-07-14: qty 42.86

## 2019-07-14 MED ORDER — SODIUM CHLORIDE 0.9 % IV SOLN
840.0000 mg | Freq: Once | INTRAVENOUS | Status: AC
  Administered 2019-07-14: 13:00:00 840 mg via INTRAVENOUS
  Filled 2019-07-14: qty 28

## 2019-07-14 MED ORDER — PALONOSETRON HCL INJECTION 0.25 MG/5ML
INTRAVENOUS | Status: AC
  Filled 2019-07-14: qty 5

## 2019-07-14 MED ORDER — ACETAMINOPHEN 325 MG PO TABS
650.0000 mg | ORAL_TABLET | Freq: Once | ORAL | Status: AC
  Administered 2019-07-14: 650 mg via ORAL

## 2019-07-14 MED ORDER — SODIUM CHLORIDE 0.9 % IV SOLN
10.0000 mg | Freq: Once | INTRAVENOUS | Status: AC
  Administered 2019-07-14: 11:00:00 10 mg via INTRAVENOUS
  Filled 2019-07-14: qty 10

## 2019-07-14 MED ORDER — DIPHENHYDRAMINE HCL 25 MG PO CAPS
25.0000 mg | ORAL_CAPSULE | Freq: Once | ORAL | Status: AC
  Administered 2019-07-14: 25 mg via ORAL

## 2019-07-14 MED ORDER — SODIUM CHLORIDE 0.9% FLUSH
10.0000 mL | Freq: Once | INTRAVENOUS | Status: AC
  Administered 2019-07-14: 08:00:00 10 mL via INTRAVENOUS
  Filled 2019-07-14: qty 10

## 2019-07-14 MED ORDER — DIPHENHYDRAMINE HCL 25 MG PO CAPS
ORAL_CAPSULE | ORAL | Status: AC
  Filled 2019-07-14: qty 1

## 2019-07-14 MED ORDER — ACETAMINOPHEN 325 MG PO TABS
ORAL_TABLET | ORAL | Status: AC
  Filled 2019-07-14: qty 2

## 2019-07-14 MED ORDER — HEPARIN SOD (PORK) LOCK FLUSH 100 UNIT/ML IV SOLN
500.0000 [IU] | Freq: Once | INTRAVENOUS | Status: AC | PRN
  Administered 2019-07-14: 500 [IU]
  Filled 2019-07-14: qty 5

## 2019-07-14 MED ORDER — PALONOSETRON HCL INJECTION 0.25 MG/5ML
0.2500 mg | Freq: Once | INTRAVENOUS | Status: AC
  Administered 2019-07-14: 0.25 mg via INTRAVENOUS

## 2019-07-14 MED ORDER — SODIUM CHLORIDE 0.9 % IV SOLN
Freq: Once | INTRAVENOUS | Status: AC
  Filled 2019-07-14: qty 250

## 2019-07-14 MED ORDER — SODIUM CHLORIDE 0.9 % IV SOLN
75.0000 mg/m2 | Freq: Once | INTRAVENOUS | Status: AC
  Administered 2019-07-14: 15:00:00 170 mg via INTRAVENOUS
  Filled 2019-07-14: qty 17

## 2019-07-14 NOTE — Patient Instructions (Signed)
[place] Discharge Instructions for Patients Receiving Chemotherapy  Today you received the following Immunotherapy agents: Trastuzumab, Pertuzumab, and chemotherapy agents: Docetaxel, and Carboplatin.  To help prevent nausea and vomiting after your treatment, we encourage you to take your nausea medication as directed by your MD.   If you develop nausea and vomiting that is not controlled by your nausea medication, call the clinic.   BELOW ARE SYMPTOMS THAT SHOULD BE REPORTED IMMEDIATELY:  *FEVER GREATER THAN 100.5 F  *CHILLS WITH OR WITHOUT FEVER  NAUSEA AND VOMITING THAT IS NOT CONTROLLED WITH YOUR NAUSEA MEDICATION  *UNUSUAL SHORTNESS OF BREATH  *UNUSUAL BRUISING OR BLEEDING  TENDERNESS IN MOUTH AND THROAT WITH OR WITHOUT PRESENCE OF ULCERS  *URINARY PROBLEMS  *BOWEL PROBLEMS  UNUSUAL RASH Items with * indicate a potential emergency and should be followed up as soon as possible.  Feel free to call the clinic should you have any questions or concerns. The clinic phone number is [phone].  Please show the [place] at check-in to the Emergency Department and triage nurse.  Coronavirus (COVID-19) Are you at risk?  Are you at risk for the Coronavirus (COVID-19)?  To be considered HIGH RISK for Coronavirus (COVID-19), you have to meet the following criteria:  . Traveled to [place], [place], [place], [place] or [place]; or in the [place] to [place], [place], [place], or [place]; and have fever, cough, and shortness of breath within the last 2 weeks of travel OR . Been in close contact with a person diagnosed with COVID-19 within the last 2 weeks and have fever, cough, and shortness of breath . IF YOU DO NOT MEET THESE CRITERIA, YOU ARE CONSIDERED LOW RISK FOR COVID-19.  What to do if you are HIGH RISK for COVID-19?  Marland Kitchen If you are having a medical emergency, call 911. . Seek medical care right away. Before you go to a doctor's  office, urgent care or emergency department, call ahead and tell them about your recent travel, contact with someone diagnosed with COVID-19, and your symptoms. You should receive instructions from your physician's office regarding next steps of care.  . When you arrive at healthcare provider, tell the healthcare staff immediately you have returned from visiting [place], [place], [place], [place] or [place]; or traveled in the [place] to [place], [place], [place], or [place]; in the last two weeks or you have been in close contact with a person diagnosed with COVID-19 in the last 2 weeks.   . Tell the health care staff about your symptoms: fever, cough and shortness of breath. . After you have been seen by a medical provider, you will be either: o Tested for (COVID-19) and discharged home on quarantine except to seek medical care if symptoms worsen, and asked to  - Stay home and avoid contact with others until you get your results (4-5 days)  - Avoid travel on public transportation if possible (such as bus, train, or airplane) or o Sent to the Emergency Department by EMS for evaluation, COVID-19 testing, and possible admission depending on your condition and test results.  What to do if you are LOW RISK for COVID-19?  Reduce your risk of any infection by using the same precautions used for avoiding the common cold or flu:  Marland Kitchen Wash your hands often with soap and warm water for at least 20 seconds.  If soap and water are not readily available, use an alcohol-based hand sanitizer with  at least 60% alcohol.  . If coughing or sneezing, cover your mouth and nose by coughing or sneezing into the elbow areas of your shirt or coat, into a tissue or into your sleeve (not your hands). . Avoid shaking hands with others and consider head nods or verbal greetings only. . Avoid touching your eyes, nose, or mouth with unwashed hands.  . Avoid close contact with people who are sick. . Avoid places or  events with large numbers of people in one location, like concerts or sporting events. . Carefully consider travel plans you have or are making. . If you are planning any travel outside or inside the [place], visit the CDC's Travelers' Health webpage for the latest health notices. . If you have some symptoms but not all symptoms, continue to monitor at home and seek medical attention if your symptoms worsen. . If you are having a medical emergency, call 911.   [place] / e-Visit: [email or web address]         [facility]: [facility]: [id number]                   [facility]: [phone]

## 2019-07-16 NOTE — Anesthesia Postprocedure Evaluation (Signed)
Anesthesia Post Note  Patient: Heidi Costa  Procedure(s) Performed: INSERTION PORT-A-CATH WITH ULTRASOUND (Left Chest)     Patient location during evaluation: PACU Anesthesia Type: General Level of consciousness: awake and alert Pain management: pain level controlled Vital Signs Assessment: post-procedure vital signs reviewed and stable Respiratory status: spontaneous breathing, nonlabored ventilation, respiratory function stable and patient connected to nasal cannula oxygen Cardiovascular status: blood pressure returned to baseline and stable Postop Assessment: no apparent nausea or vomiting Anesthetic complications: no    Last Vitals:  Vitals:   07/13/19 1008 07/13/19 1015  BP:  131/85  Pulse: 86 75  Resp: 14 15  Temp:  36.6 C  SpO2: 97% 96%    Last Pain:  Vitals:   07/13/19 1008  TempSrc:   PainSc: 6                  Kamaree Wheatley

## 2019-07-17 ENCOUNTER — Other Ambulatory Visit: Payer: Self-pay

## 2019-07-17 ENCOUNTER — Encounter: Payer: Self-pay | Admitting: Hematology and Oncology

## 2019-07-17 ENCOUNTER — Inpatient Hospital Stay: Payer: BC Managed Care – PPO

## 2019-07-17 ENCOUNTER — Telehealth: Payer: Self-pay | Admitting: Emergency Medicine

## 2019-07-17 VITALS — BP 128/71 | Temp 97.8°F | Resp 16

## 2019-07-17 DIAGNOSIS — C50411 Malignant neoplasm of upper-outer quadrant of right female breast: Secondary | ICD-10-CM

## 2019-07-17 DIAGNOSIS — Z170 Estrogen receptor positive status [ER+]: Secondary | ICD-10-CM

## 2019-07-17 MED ORDER — PEGFILGRASTIM-JMDB 6 MG/0.6ML ~~LOC~~ SOSY
6.0000 mg | PREFILLED_SYRINGE | Freq: Once | SUBCUTANEOUS | Status: AC
  Administered 2019-07-17: 6 mg via SUBCUTANEOUS

## 2019-07-17 MED ORDER — PEGFILGRASTIM-JMDB 6 MG/0.6ML ~~LOC~~ SOSY
PREFILLED_SYRINGE | SUBCUTANEOUS | Status: AC
  Filled 2019-07-17: qty 0.6

## 2019-07-17 NOTE — Telephone Encounter (Signed)
-----   Message from Lester Lilburn, RN sent at 07/14/2019  4:16 PM EDT ----- Regarding: Dr. Lindi Adie First time Trastuzumab, Pertuzumab, Docetaxel, and Carboplatin. Pt. tolerated without difficulty.

## 2019-07-17 NOTE — Telephone Encounter (Signed)
Chemo f/u call, spoke with patient.  Pt reports only mild fatigue, denies any other symptoms/side effects at this time.  Pt aware to f/u as needed.

## 2019-07-17 NOTE — Patient Instructions (Signed)

## 2019-07-17 NOTE — Progress Notes (Signed)
Met w/ pt since she had an appt today to introduce myself as herFinancial Resource Specialist andtodiscuss copay assistance. Pt gave me consent to apply in herbehalf so I completed theonlineapplicationwith Civil engineer, contracting foundation for [place].  The app is pending so I will notify herof the outcome once I receive it.  Ialso enrolled her in the[employer] program.  She wasapproved for $25,000 for Perjeta for 12 months from4/19/21. Pt's responsibility will be as little as $5 per infusion.  I offered the [employer], went over what it covers, gave her the income requirement and an expense sheet.  She would like to apply so she will bring proof of income on 07/21/19.  I gaveher my card for any questions or concernsshe may have in the future.

## 2019-07-19 ENCOUNTER — Other Ambulatory Visit: Payer: Self-pay

## 2019-07-19 ENCOUNTER — Ambulatory Visit
Admission: RE | Admit: 2019-07-19 | Discharge: 2019-07-19 | Disposition: A | Discharge status: Home / Self Care | Payer: BC Managed Care – PPO | Source: Ambulatory Visit | Attending: Hematology and Oncology | Admitting: Hematology and Oncology

## 2019-07-19 ENCOUNTER — Ambulatory Visit: Admission: RE | Admit: 2019-07-19 | Payer: BC Managed Care – PPO | Source: Ambulatory Visit

## 2019-07-19 ENCOUNTER — Other Ambulatory Visit: Payer: Self-pay | Admitting: Hematology and Oncology

## 2019-07-19 DIAGNOSIS — C50411 Malignant neoplasm of upper-outer quadrant of right female breast: Secondary | ICD-10-CM

## 2019-07-19 DIAGNOSIS — Z170 Estrogen receptor positive status [ER+]: Secondary | ICD-10-CM

## 2019-07-19 MED ORDER — GADOBUTROL 1 MMOL/ML IV SOLN
9.0000 mL | Freq: Once | INTRAVENOUS | Status: AC | PRN
  Administered 2019-07-19: 9 mL via INTRAVENOUS

## 2019-07-20 ENCOUNTER — Ambulatory Visit: Payer: BC Managed Care – PPO | Admitting: Hematology and Oncology

## 2019-07-20 ENCOUNTER — Telehealth: Payer: Self-pay

## 2019-07-20 ENCOUNTER — Encounter: Payer: Self-pay | Admitting: *Deleted

## 2019-07-20 NOTE — Telephone Encounter (Signed)
RN spoke with patient regarding generalized pain.  Pt reports she is feeling achy.  Denies any fever.  Pt was experiencing diarrhea for 3 days after infusion on 07/14/2019.  Pt denies any diarrhea today.  Pt reports drinking, "1 or 2 bottles of water daily."    RN educated patient on side effects of G-CSF injection.  RN encouraged use of Claritin and Tylenol to help with pain control.  RN educated s/s of dehydration, pt agreed to push fluids PO today, goal to drink 4 16oz bottles of water for today.  Pt has appointment on 4/23, pt will keep. Pt knows to call clinic back with any changes prior to.

## 2019-07-21 ENCOUNTER — Inpatient Hospital Stay: Payer: BC Managed Care – PPO | Admitting: Hematology and Oncology

## 2019-07-21 ENCOUNTER — Inpatient Hospital Stay: Payer: BC Managed Care – PPO

## 2019-07-21 ENCOUNTER — Inpatient Hospital Stay (HOSPITAL_BASED_OUTPATIENT_CLINIC_OR_DEPARTMENT_OTHER): Payer: BC Managed Care – PPO | Admitting: Hematology and Oncology

## 2019-07-21 ENCOUNTER — Other Ambulatory Visit: Payer: Self-pay

## 2019-07-21 DIAGNOSIS — Z95828 Presence of other vascular implants and grafts: Secondary | ICD-10-CM

## 2019-07-21 DIAGNOSIS — Z170 Estrogen receptor positive status [ER+]: Secondary | ICD-10-CM

## 2019-07-21 DIAGNOSIS — C50411 Malignant neoplasm of upper-outer quadrant of right female breast: Secondary | ICD-10-CM | POA: Diagnosis not present

## 2019-07-21 LAB — CMP (CANCER CENTER ONLY)
ALT: 26 U/L (ref 0–44)
AST: 39 U/L (ref 15–41)
Albumin: 4.1 g/dL (ref 3.5–5.0)
Alkaline Phosphatase: 63 U/L (ref 38–126)
Anion gap: 16 — ABNORMAL HIGH (ref 5–15)
BUN: 12 mg/dL (ref 6–20)
CO2: 22 mmol/L (ref 22–32)
Calcium: 9.5 mg/dL (ref 8.9–10.3)
Chloride: 96 mmol/L — ABNORMAL LOW (ref 98–111)
Creatinine: 1.33 mg/dL — ABNORMAL HIGH (ref 0.44–1.00)
GFR, Est AFR Am: 58 mL/min — ABNORMAL LOW (ref >60)
GFR, Estimated: 50 mL/min — ABNORMAL LOW (ref >60)
Glucose, Bld: 135 mg/dL — ABNORMAL HIGH (ref 70–99)
Potassium: 4.6 mmol/L (ref 3.5–5.1)
Sodium: 134 mmol/L — ABNORMAL LOW (ref 135–145)
Total Bilirubin: 0.5 mg/dL (ref 0.3–1.2)
Total Protein: 8.1 g/dL (ref 6.5–8.1)

## 2019-07-21 LAB — CBC WITH DIFFERENTIAL (CANCER CENTER ONLY)
Abs Immature Granulocytes: 0.36 10*3/uL — ABNORMAL HIGH (ref 0.00–0.07)
Basophils Absolute: 0 10*3/uL (ref 0.0–0.1)
Basophils Relative: 1 %
Eosinophils Absolute: 0 10*3/uL (ref 0.0–0.5)
Eosinophils Relative: 0 %
HCT: 41.5 % (ref 36.0–46.0)
Hemoglobin: 14.4 g/dL (ref 12.0–15.0)
Immature Granulocytes: 8 %
Lymphocytes Relative: 36 %
Lymphs Abs: 1.6 10*3/uL (ref 0.7–4.0)
MCH: 31.5 pg (ref 26.0–34.0)
MCHC: 34.7 g/dL (ref 30.0–36.0)
MCV: 90.8 fL (ref 80.0–100.0)
Monocytes Absolute: 1.7 10*3/uL — ABNORMAL HIGH (ref 0.1–1.0)
Monocytes Relative: 37 %
Neutro Abs: 0.8 10*3/uL — ABNORMAL LOW (ref 1.7–7.7)
Neutrophils Relative %: 18 %
Platelet Count: 275 10*3/uL (ref 150–400)
RBC: 4.57 MIL/uL (ref 3.87–5.11)
RDW: 12.4 % (ref 11.5–15.5)
WBC Count: 4.5 10*3/uL (ref 4.0–10.5)
nRBC: 0 % (ref 0.0–0.2)

## 2019-07-21 MED ORDER — SODIUM CHLORIDE 0.9 % IV SOLN
INTRAVENOUS | Status: AC
  Filled 2019-07-21 (×2): qty 250

## 2019-07-21 MED ORDER — PALONOSETRON HCL INJECTION 0.25 MG/5ML
INTRAVENOUS | Status: AC
  Filled 2019-07-21: qty 5

## 2019-07-21 MED ORDER — OXYCODONE-ACETAMINOPHEN 5-325 MG PO TABS: 1.0000 | ORAL_TABLET | ORAL | 0 refills | Status: DC | PRN

## 2019-07-21 MED ORDER — SODIUM CHLORIDE 0.9% FLUSH
10.0000 mL | INTRAVENOUS | Status: DC | PRN
  Administered 2019-07-21: 10:00:00 10 mL via INTRAVENOUS
  Filled 2019-07-21: qty 10

## 2019-07-21 MED ORDER — HEPARIN SOD (PORK) LOCK FLUSH 100 UNIT/ML IV SOLN
500.0000 [IU] | Freq: Once | INTRAVENOUS | Status: AC
  Administered 2019-07-21: 500 [IU] via INTRAVENOUS
  Filled 2019-07-21: qty 5

## 2019-07-21 MED ORDER — PALONOSETRON HCL INJECTION 0.25 MG/5ML
0.2500 mg | Freq: Once | INTRAVENOUS | Status: AC
  Administered 2019-07-21: 0.25 mg via INTRAVENOUS

## 2019-07-21 NOTE — Progress Notes (Signed)
Patient Care Team: Wendall Mola, NP as PCP - General (Adult Health Nurse Practitioner) Rockwell Germany, RN as Oncology Nurse Navigator Mauro Kaufmann, RN as Oncology Nurse Navigator Rolm Bookbinder, MD as Consulting Physician (General Surgery) Nicholas Lose, MD as Consulting Physician (Hematology and Oncology) Kyung Rudd, MD as Consulting Physician (Radiation Oncology)  DIAGNOSIS:  Encounter Diagnosis  Name Primary?  . Malignant neoplasm of upper-outer quadrant of right breast in female, estrogen receptor positive ([place])     SUMMARY OF ONCOLOGIC HISTORY: Oncology History  Malignant neoplasm of upper-outer quadrant of right breast in female, estrogen receptor positive ([place])  06/21/2019 Initial Diagnosis   Patient palpated a painful right breast lump x3 weeks. Mammogram showed a 2.0cm right breast mass at the 10 o'clock position, a 3.6cm enlarged right axillary lymph node, and a 0.5cm left breast mass. Biopsy showed, in the right breast and axilla, IDC, grade 3, HER-2 equivocal by IHC, + by FISH, ER+ 40%, PR - 0%, Ki67 80%, and in the left breast, fibrocystic changes, no malignancy.    07/04/2019 Cancer Staging   Staging form: Breast, AJCC 8th Edition - Clinical stage from 07/04/2019: Stage IIB (cT2, cN1(f), cM0, G3, ER+, PR-, HER2+) - Signed by Nicholas Lose, MD on 07/05/2019   07/14/2019 -  Chemotherapy   The patient had palonosetron (ALOXI) injection 0.25 mg, 0.25 mg, Intravenous,  Once, 1 of 6 cycles Administration: 0.25 mg (07/14/2019) pegfilgrastim-jmdb (FULPHILA) injection 6 mg, 6 mg, Subcutaneous,  Once, 1 of 6 cycles Administration: 6 mg (07/17/2019) CARBOplatin (PARAPLATIN) 700 mg in sodium chloride 0.9 % 250 mL chemo infusion, 700 mg (100 % of original dose 700 mg), Intravenous,  Once, 1 of 6 cycles Dose modification: 700 mg (original dose 700 mg, Cycle 1, Reason: Provider Judgment) Administration: 700 mg (07/14/2019) DOCEtaxel (TAXOTERE) 170 mg in sodium chloride 0.9 %  250 mL chemo infusion, 75 mg/m2 = 170 mg, Intravenous,  Once, 1 of 6 cycles Administration: 170 mg (07/14/2019) fosaprepitant (EMEND) 150 mg in sodium chloride 0.9 % 145 mL IVPB, 150 mg, Intravenous,  Once, 1 of 6 cycles Administration: 150 mg (07/14/2019) pertuzumab (PERJETA) 840 mg in sodium chloride 0.9 % 250 mL chemo infusion, 840 mg, Intravenous, Once, 1 of 6 cycles Administration: 840 mg (07/14/2019) trastuzumab-dkst (OGIVRI) 900 mg in sodium chloride 0.9 % 250 mL chemo infusion, 903 mg, Intravenous,  Once, 1 of 6 cycles Administration: 900 mg (07/14/2019)  for chemotherapy treatment.      CHIEF COMPLIANT: Cycle 1 day 8 TCH Perjeta  INTERVAL HISTORY: Heidi Costa is a 39 year old with above-mentioned history of HER-2 positive breast cancer who is currently on neoadjuvant chemotherapy and today cycle 1 day 8.  She had a rough time after cycle 1 of treatment.  She has complained of diarrhea.  She was not taking adequate oral intake of fluids.  She feels extremely tired.  She went for the MRI guided biopsy but did not complete it because she felt extremely nauseated and diaphoretic.  She started having the and nausea since last couple of days and she has been throwing up several times.  She tried to take Compazine but threw that up as well.  She did not follow the instructions on nausea medication for prophylaxis exactly as we described before.  She now knows how to take them.  She also did not take the diarrhea medication has been advised.  I instructed on how to take that as well.  She is feeling exasperated and feels that  she cannot do these treatments.  I reassured her that this is a temporary phenomenon and she would start to feel better in the next couple of days.   ALLERGIES:  is allergic to aspirin.  MEDICATIONS:  Current Outpatient Medications  Medication Sig Dispense Refill  . acetaminophen (TYLENOL) 500 MG tablet Take 500-1,000 mg by mouth daily as needed for pain.     Marland Kitchen  albuterol (PROVENTIL) (2.5 MG/3ML) 0.083% nebulizer solution Take 2.5 mg by nebulization every 6 (six) hours as needed for wheezing or shortness of breath.    Marland Kitchen buPROPion (WELLBUTRIN XL) 150 MG 24 hr tablet 1 tab qam x 7 days then increase to 2 pills (Patient taking differently: Take 150 mg by mouth daily. ) 60 tablet 1  . dexamethasone (DECADRON) 4 MG tablet Take 1 tablet (4 mg total) by mouth daily. take 1 tablet day before chemo and 1 tablet day after chemo with food 12 tablet 1  . fluticasone (FLOVENT HFA) 44 MCG/ACT inhaler Inhale 1 puff into the lungs 2 (two) times daily as needed (asthma).    . gabapentin (NEURONTIN) 100 MG capsule 1-3 tabs qhs for pain (Patient taking differently: Take 100 mg by mouth at bedtime as needed (pain). 1-3 tabs qhs for pain) 90 capsule 0  . ibuprofen (ADVIL) 200 MG tablet Take 200-400 mg by mouth every 6 (six) hours as needed for mild pain.    Marland Kitchen lidocaine-prilocaine (EMLA) cream Apply to affected area once 30 g 3  . lisinopril-hydrochlorothiazide (ZESTORETIC) 20-25 MG tablet Take 1 tablet by mouth daily. 30 tablet 1  . LORazepam (ATIVAN) 0.5 MG tablet Take 1 tablet (0.5 mg total) by mouth at bedtime as needed for sleep. 30 tablet 0  . metroNIDAZOLE (FLAGYL) 500 MG tablet Take 500 mg by mouth 3 (three) times daily.    . ondansetron (ZOFRAN) 8 MG tablet Take 1 tablet (8 mg total) by mouth 2 (two) times daily as needed (Nausea or vomiting). Begin 4 days after chemotherapy. 30 tablet 1  . oxyCODONE (OXY IR/ROXICODONE) 5 MG immediate release tablet Take 1 tablet (5 mg total) by mouth every 6 (six) hours as needed for moderate pain, severe pain or breakthrough pain. 10 tablet 0  . oxyCODONE-acetaminophen (PERCOCET/ROXICET) 5-325 MG tablet Take 1 tablet by mouth every 4 (four) hours as needed for severe pain. 30 tablet 0  . prochlorperazine (COMPAZINE) 10 MG tablet Take 1 tablet (10 mg total) by mouth every 6 (six) hours as needed (Nausea or vomiting). 30 tablet 1   No  current facility-administered medications for this visit.    PHYSICAL EXAMINATION: ECOG PERFORMANCE STATUS: 1 - Symptomatic but completely ambulatory  Vitals:   07/21/19 1028  BP: (!) 157/107  Pulse: (!) 103  Resp: 18  Temp: 98.2 F (36.8 C)  SpO2: 100%   There were no vitals filed for this visit.   LABORATORY DATA:  I have reviewed the data as listed CMP Latest Ref Rng & Units 07/14/2019 07/05/2019 06/06/2019  Glucose 70 - 99 mg/dL 134(H) 172(H) 96  BUN 6 - 20 mg/dL 9 8 7   Creatinine 0.44 - 1.00 mg/dL 0.87 0.96 0.82  Sodium 135 - 145 mmol/L 140 141 141  Potassium 3.5 - 5.1 mmol/L 3.5 3.5 4.5  Chloride 98 - 111 mmol/L 105 107 103  CO2 22 - 32 mmol/L 25 22 24   Calcium 8.9 - 10.3 mg/dL 8.6(L) 8.7(L) 9.3  Total Protein 6.5 - 8.1 g/dL 7.0 6.7 6.5  Total Bilirubin 0.3 - 1.2  mg/dL 0.4 0.3 0.3  Alkaline Phos 38 - 126 U/L 51 61 67  AST 15 - 41 U/L 18 15 26   ALT 0 - 44 U/L 16 15 16     Lab Results  Component Value Date   WBC 4.5 07/21/2019   HGB 14.4 07/21/2019   HCT 41.5 07/21/2019   MCV 90.8 07/21/2019   PLT 275 07/21/2019   NEUTROABS PENDING 07/21/2019    ASSESSMENT & PLAN:  Malignant neoplasm of upper-outer quadrant of right breast in female, estrogen receptor positive ([place]) 06/21/2019:Patient palpated a painful right breast lump x3 weeks. Mammogram showed a 2.0cm right breast mass at the 10 o'clock position, a 3.6cm enlarged right axillary lymph node, and a 0.5cm left breast mass. Biopsy showed, in the right breast and axilla(lymph node), IDC, grade 3, HER-2 equivocal by IHC, + by FISH, ER+ 40%, PR - 0%, Ki67 80%,  left breast, fibrocystic changes, no malignancy.Can be followed.  Treatment plan: 1. Neoadjuvant chemotherapy with TCH Perjeta 6 cycles followed by HerceptinPerjeta versus Kadcylamaintenance for 1 year 2. Followed by breast conserving surgery if possible with sentinel lymph node study 3. Followed by adjuvant radiation therapy if patient had  lumpectomy 4.Consideration for neratinib Genetic counseling Breast MRI on 07/07/19 showed the 2.9cm biopsy-proven malignancy in the right breast and axilla, highly suspicious non-mass enhancement in the lateral right breast spanning 14.5cm, and no left breast malignancy ----------------------------------------------------------------------------------------------------------------------------------------------------------- Current treatment: Cycle 1 day 8 TCH Perjeta.  Chemotherapy started 07/12/2019 Echocardiogram 07/05/2019  Chemo toxicities: 1.  Diarrhea 2.  Fatigue 3.  Nausea I counseled her extensively about how to take her medications to prevent the symptoms. Today we will administer IV fluids along with Aloxi. For the next treatment we will reduce her dosage of her treatment.  Diffuse body pains: I prescribed her Percocets to be used as needed. She will try to take this Percocets before the biopsy so that she can go through it.  Patient could not undergo biopsy of the 2 additional non-mass enhancement sites because she was feeling queasy and diaphoretic. Return to clinic in 2 weeks for cycle 2    No orders of the defined types were placed in this encounter.  The patient has a good understanding of the overall plan. she agrees with it. she will call with any problems that may develop before the next visit here. Total time spent: 30 mins including face to face time and time spent for planning, charting and co-ordination of care   Harriette Ohara, MD 07/21/19

## 2019-07-21 NOTE — Assessment & Plan Note (Signed)
06/21/2019:Patient palpated a painful right breast lump x3 weeks. Mammogram showed a 2.0cm right breast mass at the 10 o'clock position, a 3.6cm enlarged right axillary lymph node, and a 0.5cm left breast mass. Biopsy showed, in the right breast and axilla(lymph node), IDC, grade 3, HER-2 equivocal by IHC, + by FISH, ER+ 40%, PR - 0%, Ki67 80%,  left breast, fibrocystic changes, no malignancy.Can be followed.  Treatment plan: 1. Neoadjuvant chemotherapy with TCH Perjeta 6 cycles followed by HerceptinPerjeta versus Kadcylamaintenance for 1 year 2. Followed by breast conserving surgery if possible with sentinel lymph node study 3. Followed by adjuvant radiation therapy if patient had lumpectomy 4.Consideration for neratinib Genetic counseling Breast MRI on 07/07/19 showed the 2.9cm biopsy-proven malignancy in the right breast and axilla, highly suspicious non-mass enhancement in the lateral right breast spanning 14.5cm, and no left breast malignancy ----------------------------------------------------------------------------------------------------------------------------------------------------------- Current treatment: Cycle 1 day 8 TCH Perjeta.  Chemotherapy started 07/12/2019 Echocardiogram 07/05/2019  Chemo toxicities:  Patient could not undergo biopsy of the 2 additional non-mass enhancement sites because she was feeling queasy and diaphoretic. Return to clinic in 2 weeks for cycle 2 

## 2019-07-24 ENCOUNTER — Encounter: Payer: Self-pay | Admitting: Hematology and Oncology

## 2019-07-24 ENCOUNTER — Encounter: Payer: Self-pay | Admitting: *Deleted

## 2019-07-24 ENCOUNTER — Telehealth: Payer: Self-pay | Admitting: Genetic Counselor

## 2019-07-24 ENCOUNTER — Ambulatory Visit: Payer: Self-pay | Admitting: Genetic Counselor

## 2019-07-24 ENCOUNTER — Encounter: Payer: Self-pay | Admitting: Genetic Counselor

## 2019-07-24 DIAGNOSIS — Z1379 Encounter for other screening for genetic and chromosomal anomalies: Secondary | ICD-10-CM

## 2019-07-24 NOTE — Progress Notes (Signed)
PtwasapprovedforFulphila from4/23/21to4/22/22for up to $10,000.The program reduces pt's copay responsibility to $0. Ptwasalso approvedforOgivri from4/23/21to4/22/22for up to $25,000.The program reduces pt's copay responsibility to $0.

## 2019-07-24 NOTE — Telephone Encounter (Signed)
Revealed negative genetic testing. Discussed that we do not know why she has breast cancer or why there is cancer in the family. There could be a mutation in a different gene that we are not testing, or our current technology may not be able detect certain mutations. It will therefore be important for her to stay in contact with genetics to keep up with whether additional testing may be appropriate in the future.

## 2019-07-24 NOTE — Progress Notes (Signed)
HPI:  Heidi Costa was previously seen in the [facility] due to a personal history of young-onset breast cancer and concerns regarding a hereditary predisposition to cancer. Please refer to our prior cancer genetics clinic note for more information regarding our discussion, assessment and recommendations, at the time. Heidi Costa recent genetic test results were disclosed to her, as were recommendations warranted by these results. These results and recommendations are discussed in more detail below.  CANCER HISTORY:  Oncology History  Malignant neoplasm of upper-outer quadrant of right breast in female, estrogen receptor positive ([place])  06/21/2019 Initial Diagnosis   Patient palpated a painful right breast lump x3 weeks. Mammogram showed a 2.0cm right breast mass at the 10 o'clock position, a 3.6cm enlarged right axillary lymph node, and a 0.5cm left breast mass. Biopsy showed, in the right breast and axilla, IDC, grade 3, HER-2 equivocal by IHC, + by FISH, ER+ 40%, PR - 0%, Ki67 80%, and in the left breast, fibrocystic changes, no malignancy.    07/04/2019 Cancer Staging   Staging form: Breast, AJCC 8th Edition - Clinical stage from 07/04/2019: Stage IIB (cT2, cN1(f), cM0, G3, ER+, PR-, HER2+) - Signed by Nicholas Lose, MD on 07/05/2019   07/14/2019 -  Chemotherapy   The patient had palonosetron (ALOXI) injection 0.25 mg, 0.25 mg, Intravenous,  Once, 1 of 6 cycles Administration: 0.25 mg (07/14/2019) pegfilgrastim-jmdb (FULPHILA) injection 6 mg, 6 mg, Subcutaneous,  Once, 1 of 6 cycles Administration: 6 mg (07/17/2019) CARBOplatin (PARAPLATIN) 700 mg in sodium chloride 0.9 % 250 mL chemo infusion, 700 mg (100 % of original dose 700 mg), Intravenous,  Once, 1 of 6 cycles Dose modification: 700 mg (original dose 700 mg, Cycle 1, Reason: Provider Judgment) Administration: 700 mg (07/14/2019) DOCEtaxel (TAXOTERE) 170 mg in sodium chloride 0.9 % 250 mL chemo infusion, 75 mg/m2 = 170 mg,  Intravenous,  Once, 1 of 6 cycles Administration: 170 mg (07/14/2019) fosaprepitant (EMEND) 150 mg in sodium chloride 0.9 % 145 mL IVPB, 150 mg, Intravenous,  Once, 1 of 6 cycles Administration: 150 mg (07/14/2019) pertuzumab (PERJETA) 840 mg in sodium chloride 0.9 % 250 mL chemo infusion, 840 mg, Intravenous, Once, 1 of 6 cycles Administration: 840 mg (07/14/2019) trastuzumab-dkst (OGIVRI) 900 mg in sodium chloride 0.9 % 250 mL chemo infusion, 903 mg, Intravenous,  Once, 1 of 6 cycles Administration: 900 mg (07/14/2019)  for chemotherapy treatment.    07/22/2019 Genetic Testing   Negative genetic testing:  No pathogenic variants detected on the [facility] Common Hereditary Cancers Panel. The report date is 07/22/2019.  The Common Hereditary Cancers Panel offered by [facility] includes sequencing and/or deletion duplication testing of the following 48 genes: APC, ATM, AXIN2, BARD1, BMPR1A, BRCA1, BRCA2, BRIP1, CDH1, CDK4, CDKN2A (p14ARF), CDKN2A (p16INK4a), CHEK2, CTNNA1, DICER1, EPCAM (Deletion/duplication testing only), GREM1 (promoter region deletion/duplication testing only), KIT, MEN1, MLH1, MSH2, MSH3, MSH6, MUTYH, NBN, NF1, NHTL1, PALB2, PDGFRA, PMS2, POLD1, POLE, PTEN, RAD50, RAD51C, RAD51D, RNF43, SDHB, SDHC, SDHD, SMAD4, SMARCA4. STK11, TP53, TSC1, TSC2, and VHL.  The following genes were evaluated for sequence changes only: SDHA and HOXB13 c.251G>A variant only.      FAMILY HISTORY:  We obtained a detailed, 4-generation family history.  Significant diagnoses are listed below: Family History  Problem Relation Age of Onset   Osteoarthritis Mother    Hypertension Mother    Diabetes Mother    Hypertension Father    Gout Father    Stroke Father 10  embolic   Diabetes Half-Brother    Hypertension Half-Brother    Heart failure Half-Brother 62   Kidney disease Half-Brother    Diabetes Paternal Grandfather    Cancer Paternal Grandfather 37       breast cancer   Heidi Costa  has one son who is 56 and has not had cancer. She has two maternal half-brothers who are in their late 60s, and a paternal half-brother and half-sister who are in their 54s.   Heidi Costa mother is 54 years old and has not had cancer. Her mother was adopted, and thus she has no information about her maternal family history.  Heidi Costa father is 64 years old and has not had cancer. She has one paternal aunt and one paternal uncle who are around 19 years old and have not had cancer. Heidi Costa does not have any paternal cousins. Her paternal grandmother died in her 46s from an unknown cause, and her paternal grandfather died at the age of 52 and had a history of cancer (Heidi Costa is not sure exactly what type) that resulted in the removal of his breast.   Heidi Costa is unaware of previous family history of genetic testing for hereditary cancer risks. Her maternal and paternal ancestors are of Black/African American descent.  There is no reported Ashkenazi Jewish ancestry. There is no known consanguinity.  GENETIC TEST RESULTS: Genetic testing reported out on 07/22/2019 through the [facility] Common Hereditary Cancers panel. No pathogenic variants were detected.   The Common Hereditary Cancers Panel offered by [facility] includes sequencing and/or deletion duplication testing of the following 48 genes: APC, ATM, AXIN2, BARD1, BMPR1A, BRCA1, BRCA2, BRIP1, CDH1, CDK4, CDKN2A (p14ARF), CDKN2A (p16INK4a), CHEK2, CTNNA1, DICER1, EPCAM (Deletion/duplication testing only), GREM1 (promoter region deletion/duplication testing only), KIT, MEN1, MLH1, MSH2, MSH3, MSH6, MUTYH, NBN, NF1, NHTL1, PALB2, PDGFRA, PMS2, POLD1, POLE, PTEN, RAD50, RAD51C, RAD51D, RNF43, SDHB, SDHC, SDHD, SMAD4, SMARCA4. STK11, TP53, TSC1, TSC2, and VHL.  The following genes were evaluated for sequence changes only: SDHA and HOXB13 c.251G>A variant only. The test report will be scanned into EPIC and located under the Molecular Pathology section of  the Results Review tab.  A portion of the result report is included below for reference.     We discussed with Heidi Costa that because current genetic testing is not perfect, it is possible there may be a gene mutation in one of these genes that current testing cannot detect, but that chance is small.  We also discussed, that there could be another gene that has not yet been discovered, or that we have not yet tested, that is responsible for the cancer diagnoses in the family. It is also possible there is a hereditary cause for the cancer in the family that Heidi Costa did not inherit and therefore was not identified in her testing.  Therefore, it is important to remain in touch with cancer genetics in the future so that we can continue to offer Heidi Costa the most up to date genetic testing.   CANCER SCREENING RECOMMENDATIONS: Heidi Costa test result is considered negative (normal).  This means that we have not identified a hereditary cause for her personal and family history of cancer at this time. Most cancers happen by chance and this negative test suggests that her personal and family of cancer may fall into this category.    While reassuring, this does not definitively rule out a hereditary predisposition to cancer. It is still possible that there could be genetic  mutations that are undetectable by current technology. There could be genetic mutations in genes that have not been tested or identified to increase cancer risk.  Therefore, it is recommended she continue to follow the cancer management and screening guidelines provided by her oncology and primary healthcare providers.   An individual's cancer risk and medical management are not determined by genetic test results alone. Overall cancer risk assessment incorporates additional factors, including personal medical history, family history, and any available genetic information that may result in a personalized plan for cancer prevention and  surveillance.  RECOMMENDATIONS FOR FAMILY MEMBERS:  Individuals in this family might be at some increased risk of developing cancer, over the general population risk, simply due to the family history of cancer.  We recommended women in this family have a yearly mammogram beginning at age 45, or 105 years younger than the earliest onset of cancer, an annual clinical breast exam, and perform monthly breast self-exams. Women in this family should also have a gynecological exam as recommended by their primary provider. All family members should have a colonoscopy by age 46.  FOLLOW-UP: Lastly, we discussed with Heidi Costa that cancer genetics is a rapidly advancing field and it is possible that new genetic tests will be appropriate for her and/or her family members in the future. We encouraged her to remain in contact with cancer genetics on an annual basis so we can update her personal and family histories and let her know of advances in cancer genetics that may benefit this family.   Our contact number was provided. Heidi Costa questions were answered to her satisfaction, and she knows she is welcome to call us at anytime with additional questions or concerns.   Clint Guy, MS, [facility] Genetic Counselor [email or web address] Phone: [phone]

## 2019-07-26 ENCOUNTER — Telehealth: Payer: Self-pay

## 2019-07-26 NOTE — Telephone Encounter (Signed)
Nutrition Assessment:  Patient identified by attending Breast Clinic on 07/05/19.  She was given nutrition packet by nurse navigator.  39 year old female with new diagnosis of breast cancer. She is currently undergoing neoadjuvant chemotherapy.    Spoke with patient via phone to introduce self and service at [facility].  Patient reports first chemo treatment was tough but things have been a little bit better.  Reports has had issues with diarrhea since starting treatment.  Reports ate spaghetti last night for dinner and has had 4-5 episodes of diarrhea overnight and into this am.  She states that she is not taking anything for the diarrhea. Reports that she is having a hard time with drinking enough fluids  Medications: zofran, compazine, ativan  Labs: reviewed  Anthropometrics:   Height: 65 inches Weight: 247 lb UBW: stable BMI: 41   NUTRITION DIAGNOSIS: Food and nutrition related knowledge deficit related to cancer and chemotherapy side effects as evidenced by unsure foods to eat with diarrhea   INTERVENTION:  Discussed foods to eat when having issues with diarrhea.  Will mail handout.   Encouraged patient to reach out to Dr. Geralyn Flash team regarding issues with diarrhea and if she needs to start taking an OTC medication to help her symptoms. Patient verbalized that she will call.   Provided patient with contact information    NEXT VISIT: no follow-up, patient to call RD if needed  Cathe Bilger B. Zenia Resides, [place], [place] Registered Dietitian [phone] (pager)

## 2019-07-31 NOTE — Progress Notes (Signed)
Pharmacist Chemotherapy Monitoring - Follow Up Assessment    I verify that I have reviewed each item in the below checklist:  . Regimen for the patient is scheduled for the appropriate day and plan matches scheduled date. Marland Kitchen Appropriate non-routine labs are ordered dependent on drug ordered. . If applicable, additional medications reviewed and ordered per protocol based on lifetime cumulative doses and/or treatment regimen.   Plan for follow-up and/or issues identified: No . I-vent associated with next due treatment: No . MD and/or nursing notified: No  Roshunda Keir K 07/31/2019 11:18 AM

## 2019-08-03 NOTE — Progress Notes (Signed)
Patient Care Team: Wendall Mola, NP as PCP - General (Adult Health Nurse Practitioner) Rockwell Germany, RN as Oncology Nurse Navigator Mauro Kaufmann, RN as Oncology Nurse Navigator Rolm Bookbinder, MD as Consulting Physician (General Surgery) Nicholas Lose, MD as Consulting Physician (Hematology and Oncology) Kyung Rudd, MD as Consulting Physician (Radiation Oncology)  DIAGNOSIS:    ICD-10-CM   1. Malignant neoplasm of upper-outer quadrant of right breast in female, estrogen receptor positive ([place])  C50.411    Z17.0     SUMMARY OF ONCOLOGIC HISTORY: Oncology History  Malignant neoplasm of upper-outer quadrant of right breast in female, estrogen receptor positive ([place])  06/21/2019 Initial Diagnosis   Patient palpated a painful right breast lump x3 weeks. Mammogram showed a 2.0cm right breast mass at the 10 o'clock position, a 3.6cm enlarged right axillary lymph node, and a 0.5cm left breast mass. Biopsy showed, in the right breast and axilla, IDC, grade 3, HER-2 equivocal by IHC, + by FISH, ER+ 40%, PR - 0%, Ki67 80%, and in the left breast, fibrocystic changes, no malignancy.    07/04/2019 Cancer Staging   Staging form: Breast, AJCC 8th Edition - Clinical stage from 07/04/2019: Stage IIB (cT2, cN1(f), cM0, G3, ER+, PR-, HER2+) - Signed by Nicholas Lose, MD on 07/05/2019   07/14/2019 -  Chemotherapy   The patient had palonosetron (ALOXI) injection 0.25 mg, 0.25 mg, Intravenous,  Once, 1 of 6 cycles Administration: 0.25 mg (07/14/2019) pegfilgrastim-jmdb (FULPHILA) injection 6 mg, 6 mg, Subcutaneous,  Once, 1 of 6 cycles Administration: 6 mg (07/17/2019) CARBOplatin (PARAPLATIN) 700 mg in sodium chloride 0.9 % 250 mL chemo infusion, 700 mg (100 % of original dose 700 mg), Intravenous,  Once, 1 of 6 cycles Dose modification: 700 mg (original dose 700 mg, Cycle 1, Reason: Provider Judgment), 600 mg (original dose 700 mg, Cycle 2, Reason: Dose not tolerated) Administration: 700 mg  (07/14/2019) DOCEtaxel (TAXOTERE) 170 mg in sodium chloride 0.9 % 250 mL chemo infusion, 75 mg/m2 = 170 mg, Intravenous,  Once, 1 of 6 cycles Dose modification: 65 mg/m2 (original dose 75 mg/m2, Cycle 2, Reason: Dose not tolerated) Administration: 170 mg (07/14/2019) fosaprepitant (EMEND) 150 mg in sodium chloride 0.9 % 145 mL IVPB, 150 mg, Intravenous,  Once, 1 of 6 cycles Administration: 150 mg (07/14/2019) pertuzumab (PERJETA) 840 mg in sodium chloride 0.9 % 250 mL chemo infusion, 840 mg, Intravenous, Once, 1 of 6 cycles Administration: 840 mg (07/14/2019) trastuzumab-dkst (OGIVRI) 900 mg in sodium chloride 0.9 % 250 mL chemo infusion, 903 mg, Intravenous,  Once, 1 of 6 cycles Administration: 900 mg (07/14/2019)  for chemotherapy treatment.    07/22/2019 Genetic Testing   Negative genetic testing:  No pathogenic variants detected on the [facility] Common Hereditary Cancers Panel. The report date is 07/22/2019.  The Common Hereditary Cancers Panel offered by [facility] includes sequencing and/or deletion duplication testing of the following 48 genes: APC, ATM, AXIN2, BARD1, BMPR1A, BRCA1, BRCA2, BRIP1, CDH1, CDK4, CDKN2A (p14ARF), CDKN2A (p16INK4a), CHEK2, CTNNA1, DICER1, EPCAM (Deletion/duplication testing only), GREM1 (promoter region deletion/duplication testing only), KIT, MEN1, MLH1, MSH2, MSH3, MSH6, MUTYH, NBN, NF1, NHTL1, PALB2, PDGFRA, PMS2, POLD1, POLE, PTEN, RAD50, RAD51C, RAD51D, RNF43, SDHB, SDHC, SDHD, SMAD4, SMARCA4. STK11, TP53, TSC1, TSC2, and VHL.  The following genes were evaluated for sequence changes only: SDHA and HOXB13 c.251G>A variant only.      CHIEF COMPLIANT: Cycle 2 TCH Perjeta  INTERVAL HISTORY: Heidi Costa is a 39 y.o. with above-mentioned history of HER-2 positive breast  cancer who is currently on neoadjuvant chemotherapy with [place]. She presents to the clinic today for cycle 2.  She did quite well over the past 2 weeks.  She reports that she had intermittent  diarrhea for which she took Lomotil.  The bone pain had subsided.  She has an appointment next week for the biopsy.  She has Percocets as needed for pain.  Denies any nausea or vomiting.  Energy levels are slightly low.  Decadron was making her eat a lot of food.  I instructed her to stop the Decadron at this time.  ALLERGIES:  is allergic to aspirin.  MEDICATIONS:  Current Outpatient Medications  Medication Sig Dispense Refill  . acetaminophen (TYLENOL) 500 MG tablet Take 500-1,000 mg by mouth daily as needed for pain.     Marland Kitchen albuterol (PROVENTIL) (2.5 MG/3ML) 0.083% nebulizer solution Take 2.5 mg by nebulization every 6 (six) hours as needed for wheezing or shortness of breath.    Marland Kitchen buPROPion (WELLBUTRIN XL) 150 MG 24 hr tablet 1 tab qam x 7 days then increase to 2 pills (Patient taking differently: Take 150 mg by mouth daily. ) 60 tablet 1  . dexamethasone (DECADRON) 4 MG tablet Take 1 tablet (4 mg total) by mouth daily. take 1 tablet day before chemo and 1 tablet day after chemo with food 12 tablet 1  . fluticasone (FLOVENT HFA) 44 MCG/ACT inhaler Inhale 1 puff into the lungs 2 (two) times daily as needed (asthma).    . gabapentin (NEURONTIN) 100 MG capsule 1-3 tabs qhs for pain (Patient taking differently: Take 100 mg by mouth at bedtime as needed (pain). 1-3 tabs qhs for pain) 90 capsule 0  . ibuprofen (ADVIL) 200 MG tablet Take 200-400 mg by mouth every 6 (six) hours as needed for mild pain.    Marland Kitchen lidocaine-prilocaine (EMLA) cream Apply to affected area once 30 g 3  . lisinopril-hydrochlorothiazide (ZESTORETIC) 20-25 MG tablet Take 1 tablet by mouth daily. 30 tablet 1  . LORazepam (ATIVAN) 0.5 MG tablet Take 1 tablet (0.5 mg total) by mouth at bedtime as needed for sleep. 30 tablet 0  . metroNIDAZOLE (FLAGYL) 500 MG tablet Take 500 mg by mouth 3 (three) times daily.    . ondansetron (ZOFRAN) 8 MG tablet Take 1 tablet (8 mg total) by mouth 2 (two) times daily as needed (Nausea or vomiting).  Begin 4 days after chemotherapy. 30 tablet 1  . oxyCODONE (OXY IR/ROXICODONE) 5 MG immediate release tablet Take 1 tablet (5 mg total) by mouth every 6 (six) hours as needed for moderate pain, severe pain or breakthrough pain. 10 tablet 0  . oxyCODONE-acetaminophen (PERCOCET/ROXICET) 5-325 MG tablet Take 1 tablet by mouth every 4 (four) hours as needed for severe pain. 30 tablet 0  . prochlorperazine (COMPAZINE) 10 MG tablet Take 1 tablet (10 mg total) by mouth every 6 (six) hours as needed (Nausea or vomiting). 30 tablet 1   No current facility-administered medications for this visit.    PHYSICAL EXAMINATION: ECOG PERFORMANCE STATUS: 1 - Symptomatic but completely ambulatory  Vitals:   08/04/19 0829  BP: 121/68  Pulse: 72  Resp: 18  Temp: 98.2 F (36.8 C)  SpO2: 100%   Filed Weights   08/04/19 0829  Weight: 240 lb 1.6 oz (108.9 kg)    LABORATORY DATA:  I have reviewed the data as listed CMP Latest Ref Rng & Units 07/21/2019 07/14/2019 07/05/2019  Glucose 70 - 99 mg/dL 135(H) 134(H) 172(H)  BUN 6 -  20 mg/dL _0 Creatinine 0.44 - 1.00 mg/dL 1.33(H) 0.87 0.96  Sodium 135 - 145 mmol/L 134(L) 140 141  Potassium 3.5 - 5.1 mmol/L 4.6 3.5 3.5  Chloride 98 - 111 mmol/L 96(L) 105 107  CO2 22 - 32 mmol/L _1 Calcium 8.9 - 10.3 mg/dL 9.5 8.6(L) 8.7(L)  Total Protein 6.5 - 8.1 g/dL 8.1 7.0 6.7  Total Bilirubin 0.3 - 1.2 mg/dL 0.5 0.4 0.3  Alkaline Phos 38 - 126 U/L 63 51 61  AST 15 - 41 U/L 39 18 15  ALT 0 - 44 U/L _2 Lab Results  Component Value Date   WBC 9.1 08/04/2019   HGB 11.6 (L) 08/04/2019   HCT 35.2 (L) 08/04/2019   MCV 96.2 08/04/2019   PLT 409 (H) 08/04/2019   NEUTROABS 7.2 08/04/2019    ASSESSMENT & PLAN:  Malignant neoplasm of upper-outer quadrant of right breast in female, estrogen receptor positive ([place]) 06/21/2019:Patient palpated a painful right breast lump x3 weeks. Mammogram showed a 2.0cm right breast mass at the 10 o'clock position, a  3.6cm enlarged right axillary lymph node, and a 0.5cm left breast mass. Biopsy showed, in the right breast and axilla(lymph node), IDC, grade 3, HER-2 equivocal by IHC, + by FISH, ER+ 40%, PR - 0%, Ki67 80%,  left breast, fibrocystic changes, no malignancy.Can be followed.  Treatment plan: 1. Neoadjuvant chemotherapy with TCH Perjeta 6 cycles followed by HerceptinPerjeta versus Kadcylamaintenance for 1 year 2. Followed by breast conserving surgery if possible with sentinel lymph node study 3. Followed by adjuvant radiation therapy if patient had lumpectomy 4.Consideration for neratinib Genetic counseling Breast MRI on 07/07/19 showed the 2.9cm biopsy-proven malignancy in the right breast and axilla, highly suspicious non-mass enhancement in the lateral right breast spanning 14.5cm, and no left breast malignancy ----------------------------------------------------------------------------------------------------------------------------------------------------------- Current treatment: Cycle 1 day 8 TCH Perjeta.  Chemotherapy started 07/12/2019 Echocardiogram 07/05/2019  Chemo toxicities: 1.  Diarrhea required IV fluids: Dose reduced with cycle 2 2.  Fatigue 3.  Nausea  Major increase in appetite: Discontinued oral Decadron. Diffuse body pains: I prescribed her Percocets to be used as needed.  Patient could not undergo biopsy of the 2 additional non-mass enhancement sites because she was feeling queasy and diaphoretic.  She is scheduled for next week. Return to clinic in 3 weeks for cycle 3    No orders of the defined types were placed in this encounter.  The patient has a good understanding of the overall plan. she agrees with it. she will call with any problems that may develop before the next visit here.  Total time spent: 30 mins including face to face time and time spent for planning, charting and coordination of care  Nicholas Lose, MD 08/04/2019  I, Cloyde Reams Dorshimer, am  acting as scribe for Dr. Nicholas Lose.  I have reviewed the above documentation for accuracy and completeness, and I agree with the above.

## 2019-08-04 ENCOUNTER — Other Ambulatory Visit: Payer: Self-pay

## 2019-08-04 ENCOUNTER — Encounter: Payer: Self-pay | Admitting: *Deleted

## 2019-08-04 ENCOUNTER — Inpatient Hospital Stay: Payer: BC Managed Care – PPO

## 2019-08-04 ENCOUNTER — Inpatient Hospital Stay (HOSPITAL_BASED_OUTPATIENT_CLINIC_OR_DEPARTMENT_OTHER): Payer: BC Managed Care – PPO | Admitting: Hematology and Oncology

## 2019-08-04 ENCOUNTER — Inpatient Hospital Stay: Payer: BC Managed Care – PPO | Attending: Hematology and Oncology

## 2019-08-04 DIAGNOSIS — Z7689 Persons encountering health services in other specified circumstances: Secondary | ICD-10-CM | POA: Insufficient documentation

## 2019-08-04 DIAGNOSIS — Z170 Estrogen receptor positive status [ER+]: Secondary | ICD-10-CM

## 2019-08-04 DIAGNOSIS — F418 Other specified anxiety disorders: Secondary | ICD-10-CM | POA: Insufficient documentation

## 2019-08-04 DIAGNOSIS — R110 Nausea: Secondary | ICD-10-CM | POA: Insufficient documentation

## 2019-08-04 DIAGNOSIS — R5383 Other fatigue: Secondary | ICD-10-CM | POA: Insufficient documentation

## 2019-08-04 DIAGNOSIS — E669 Obesity, unspecified: Secondary | ICD-10-CM | POA: Insufficient documentation

## 2019-08-04 DIAGNOSIS — Z5111 Encounter for antineoplastic chemotherapy: Secondary | ICD-10-CM | POA: Diagnosis not present

## 2019-08-04 DIAGNOSIS — E785 Hyperlipidemia, unspecified: Secondary | ICD-10-CM | POA: Insufficient documentation

## 2019-08-04 DIAGNOSIS — C50411 Malignant neoplasm of upper-outer quadrant of right female breast: Secondary | ICD-10-CM | POA: Insufficient documentation

## 2019-08-04 DIAGNOSIS — Z87891 Personal history of nicotine dependence: Secondary | ICD-10-CM | POA: Diagnosis not present

## 2019-08-04 DIAGNOSIS — R197 Diarrhea, unspecified: Secondary | ICD-10-CM | POA: Insufficient documentation

## 2019-08-04 DIAGNOSIS — I10 Essential (primary) hypertension: Secondary | ICD-10-CM | POA: Diagnosis not present

## 2019-08-04 DIAGNOSIS — C778 Secondary and unspecified malignant neoplasm of lymph nodes of multiple regions: Secondary | ICD-10-CM | POA: Diagnosis not present

## 2019-08-04 DIAGNOSIS — K219 Gastro-esophageal reflux disease without esophagitis: Secondary | ICD-10-CM | POA: Insufficient documentation

## 2019-08-04 DIAGNOSIS — E860 Dehydration: Secondary | ICD-10-CM | POA: Insufficient documentation

## 2019-08-04 DIAGNOSIS — Z79899 Other long term (current) drug therapy: Secondary | ICD-10-CM | POA: Insufficient documentation

## 2019-08-04 DIAGNOSIS — Z95828 Presence of other vascular implants and grafts: Secondary | ICD-10-CM | POA: Insufficient documentation

## 2019-08-04 LAB — CMP (CANCER CENTER ONLY)
ALT: 21 U/L (ref 0–44)
AST: 12 U/L — ABNORMAL LOW (ref 15–41)
Albumin: 3.6 g/dL (ref 3.5–5.0)
Alkaline Phosphatase: 45 U/L (ref 38–126)
Anion gap: 14 (ref 5–15)
BUN: 16 mg/dL (ref 6–20)
CO2: 22 mmol/L (ref 22–32)
Calcium: 8.9 mg/dL (ref 8.9–10.3)
Chloride: 102 mmol/L (ref 98–111)
Creatinine: 0.93 mg/dL (ref 0.44–1.00)
GFR, Est AFR Am: >60 mL/min (ref >60)
GFR, Estimated: >60 mL/min (ref >60)
Glucose, Bld: 178 mg/dL — ABNORMAL HIGH (ref 70–99)
Potassium: 3.9 mmol/L (ref 3.5–5.1)
Sodium: 138 mmol/L (ref 135–145)
Total Bilirubin: 0.4 mg/dL (ref 0.3–1.2)
Total Protein: 6.9 g/dL (ref 6.5–8.1)

## 2019-08-04 LAB — CBC WITH DIFFERENTIAL (CANCER CENTER ONLY)
Abs Immature Granulocytes: 0.03 10*3/uL (ref 0.00–0.07)
Basophils Absolute: 0 10*3/uL (ref 0.0–0.1)
Basophils Relative: 0 %
Eosinophils Absolute: 0 10*3/uL (ref 0.0–0.5)
Eosinophils Relative: 0 %
HCT: 35.2 % — ABNORMAL LOW (ref 36.0–46.0)
Hemoglobin: 11.6 g/dL — ABNORMAL LOW (ref 12.0–15.0)
Immature Granulocytes: 0 %
Lymphocytes Relative: 16 %
Lymphs Abs: 1.4 10*3/uL (ref 0.7–4.0)
MCH: 31.7 pg (ref 26.0–34.0)
MCHC: 33 g/dL (ref 30.0–36.0)
MCV: 96.2 fL (ref 80.0–100.0)
Monocytes Absolute: 0.4 10*3/uL (ref 0.1–1.0)
Monocytes Relative: 4 %
Neutro Abs: 7.2 10*3/uL (ref 1.7–7.7)
Neutrophils Relative %: 80 %
Platelet Count: 409 10*3/uL — ABNORMAL HIGH (ref 150–400)
RBC: 3.66 MIL/uL — ABNORMAL LOW (ref 3.87–5.11)
RDW: 14 % (ref 11.5–15.5)
WBC Count: 9.1 10*3/uL (ref 4.0–10.5)
nRBC: 0 % (ref 0.0–0.2)

## 2019-08-04 LAB — PREGNANCY, URINE: Preg Test, Ur: NEGATIVE

## 2019-08-04 MED ORDER — SODIUM CHLORIDE 0.9% FLUSH
10.0000 mL | Freq: Once | INTRAVENOUS | Status: AC
  Administered 2019-08-04: 10 mL
  Filled 2019-08-04: qty 10

## 2019-08-04 MED ORDER — SODIUM CHLORIDE 0.9 % IV SOLN
65.0000 mg/m2 | Freq: Once | INTRAVENOUS | Status: AC
  Administered 2019-08-04: 150 mg via INTRAVENOUS
  Filled 2019-08-04: qty 15

## 2019-08-04 MED ORDER — SODIUM CHLORIDE 0.9 % IV SOLN
420.0000 mg | Freq: Once | INTRAVENOUS | Status: AC
  Administered 2019-08-04: 11:00:00 420 mg via INTRAVENOUS
  Filled 2019-08-04: qty 14

## 2019-08-04 MED ORDER — DIPHENHYDRAMINE HCL 25 MG PO CAPS
ORAL_CAPSULE | ORAL | Status: AC
  Filled 2019-08-04: qty 1

## 2019-08-04 MED ORDER — HEPARIN SOD (PORK) LOCK FLUSH 100 UNIT/ML IV SOLN
500.0000 [IU] | Freq: Once | INTRAVENOUS | Status: AC | PRN
  Administered 2019-08-04: 14:00:00 500 [IU]
  Filled 2019-08-04: qty 5

## 2019-08-04 MED ORDER — PALONOSETRON HCL INJECTION 0.25 MG/5ML
0.2500 mg | Freq: Once | INTRAVENOUS | Status: AC
  Administered 2019-08-04: 09:00:00 0.25 mg via INTRAVENOUS

## 2019-08-04 MED ORDER — SODIUM CHLORIDE 0.9 % IV SOLN
150.0000 mg | Freq: Once | INTRAVENOUS | Status: AC
  Administered 2019-08-04: 10:00:00 150 mg via INTRAVENOUS
  Filled 2019-08-04: qty 150

## 2019-08-04 MED ORDER — PALONOSETRON HCL INJECTION 0.25 MG/5ML
INTRAVENOUS | Status: AC
  Filled 2019-08-04: qty 5

## 2019-08-04 MED ORDER — DIPHENHYDRAMINE HCL 25 MG PO CAPS
25.0000 mg | ORAL_CAPSULE | Freq: Once | ORAL | Status: AC
  Administered 2019-08-04: 09:00:00 25 mg via ORAL

## 2019-08-04 MED ORDER — TRASTUZUMAB-DKST CHEMO 150 MG IV SOLR
6.0000 mg/kg | Freq: Once | INTRAVENOUS | Status: AC
  Administered 2019-08-04: 672 mg via INTRAVENOUS
  Filled 2019-08-04: qty 32

## 2019-08-04 MED ORDER — SODIUM CHLORIDE 0.9 % IV SOLN
Freq: Once | INTRAVENOUS | Status: AC
  Filled 2019-08-04: qty 250

## 2019-08-04 MED ORDER — SODIUM CHLORIDE 0.9 % IV SOLN
10.0000 mg | Freq: Once | INTRAVENOUS | Status: AC
  Administered 2019-08-04: 10 mg via INTRAVENOUS
  Filled 2019-08-04: qty 10

## 2019-08-04 MED ORDER — SODIUM CHLORIDE 0.9 % IV SOLN
600.0000 mg | Freq: Once | INTRAVENOUS | Status: AC
  Administered 2019-08-04: 13:00:00 600 mg via INTRAVENOUS
  Filled 2019-08-04: qty 60

## 2019-08-04 MED ORDER — ACETAMINOPHEN 325 MG PO TABS
650.0000 mg | ORAL_TABLET | Freq: Once | ORAL | Status: AC
  Administered 2019-08-04: 09:00:00 650 mg via ORAL

## 2019-08-04 MED ORDER — SODIUM CHLORIDE 0.9% FLUSH
10.0000 mL | INTRAVENOUS | Status: DC | PRN
  Administered 2019-08-04: 14:00:00 10 mL
  Filled 2019-08-04: qty 10

## 2019-08-04 MED ORDER — ACETAMINOPHEN 325 MG PO TABS
ORAL_TABLET | ORAL | Status: AC
  Filled 2019-08-04: qty 2

## 2019-08-04 NOTE — Assessment & Plan Note (Signed)
06/21/2019:Patient palpated a painful right breast lump x3 weeks. Mammogram showed a 2.0cm right breast mass at the 10 o'clock position, a 3.6cm enlarged right axillary lymph node, and a 0.5cm left breast mass. Biopsy showed, in the right breast and axilla(lymph node), IDC, grade 3, HER-2 equivocal by IHC, + by FISH, ER+ 40%, PR - 0%, Ki67 80%,  left breast, fibrocystic changes, no malignancy.Can be followed.  Treatment plan: 1. Neoadjuvant chemotherapy with TCH Perjeta 6 cycles followed by HerceptinPerjeta versus Kadcylamaintenance for 1 year 2. Followed by breast conserving surgery if possible with sentinel lymph node study 3. Followed by adjuvant radiation therapy if patient had lumpectomy 4.Consideration for neratinib Genetic counseling Breast MRI on 07/07/19 showed the 2.9cm biopsy-proven malignancy in the right breast and axilla, highly suspicious non-mass enhancement in the lateral right breast spanning 14.5cm, and no left breast malignancy ----------------------------------------------------------------------------------------------------------------------------------------------------------- Current treatment: Cycle 1 day 8 TCH Perjeta.  Chemotherapy started 07/12/2019 Echocardiogram 07/05/2019  Chemo toxicities: 1.  Diarrhea required IV fluids: Dose reduced with cycle 2 2.  Fatigue 3.  Nausea   Diffuse body pains: I prescribed her Percocets to be used as needed.  Patient could not undergo biopsy of the 2 additional non-mass enhancement sites because she was feeling queasy and diaphoretic. Return to clinic in 3 weeks for cycle 3

## 2019-08-04 NOTE — Patient Instructions (Signed)
[facility] Discharge Instructions for Patients Receiving Chemotherapy  Today you received the following chemotherapy agents: trastuzumab/pertuzumab/taxotere/carboplatin.  To help prevent nausea and vomiting after your treatment, we encourage you to take your nausea medication as directed.   If you develop nausea and vomiting that is not controlled by your nausea medication, call the clinic.   BELOW ARE SYMPTOMS THAT SHOULD BE REPORTED IMMEDIATELY:  *FEVER GREATER THAN 100.5 F  *CHILLS WITH OR WITHOUT FEVER  NAUSEA AND VOMITING THAT IS NOT CONTROLLED WITH YOUR NAUSEA MEDICATION  *UNUSUAL SHORTNESS OF BREATH  *UNUSUAL BRUISING OR BLEEDING  TENDERNESS IN MOUTH AND THROAT WITH OR WITHOUT PRESENCE OF ULCERS  *URINARY PROBLEMS  *BOWEL PROBLEMS  UNUSUAL RASH Items with * indicate a potential emergency and should be followed up as soon as possible.  Feel free to call the clinic should you have any questions or concerns. The clinic phone number is [phone].  Please show the CHEMO ALERT CARD at check-in to the Emergency Department and triage nurse.   

## 2019-08-07 ENCOUNTER — Other Ambulatory Visit: Payer: Self-pay

## 2019-08-07 ENCOUNTER — Inpatient Hospital Stay: Payer: BC Managed Care – PPO

## 2019-08-07 ENCOUNTER — Encounter: Payer: Self-pay | Admitting: Hematology and Oncology

## 2019-08-07 VITALS — BP 110/70 | HR 70 | Temp 98.2°F | Resp 18

## 2019-08-07 DIAGNOSIS — C50411 Malignant neoplasm of upper-outer quadrant of right female breast: Secondary | ICD-10-CM | POA: Diagnosis not present

## 2019-08-07 MED ORDER — PEGFILGRASTIM-JMDB 6 MG/0.6ML ~~LOC~~ SOSY
PREFILLED_SYRINGE | SUBCUTANEOUS | Status: AC
  Filled 2019-08-07: qty 0.6

## 2019-08-07 MED ORDER — PEGFILGRASTIM-JMDB 6 MG/0.6ML ~~LOC~~ SOSY
6.0000 mg | PREFILLED_SYRINGE | Freq: Once | SUBCUTANEOUS | Status: AC
  Administered 2019-08-07: 6 mg via SUBCUTANEOUS

## 2019-08-07 NOTE — Progress Notes (Signed)
Pt is approved for the $1000 [employer] grant.  

## 2019-08-07 NOTE — Patient Instructions (Signed)

## 2019-08-07 NOTE — Telephone Encounter (Signed)
No ation required at this time, closing encounter from inbox 

## 2019-08-08 ENCOUNTER — Other Ambulatory Visit: Payer: Self-pay | Admitting: Hematology and Oncology

## 2019-08-08 ENCOUNTER — Encounter: Payer: Self-pay | Admitting: *Deleted

## 2019-08-08 ENCOUNTER — Ambulatory Visit: Admission: RE | Admit: 2019-08-08 | Payer: BC Managed Care – PPO | Source: Ambulatory Visit

## 2019-08-08 ENCOUNTER — Ambulatory Visit
Admission: RE | Admit: 2019-08-08 | Discharge: 2019-08-08 | Disposition: A | Discharge status: Home / Self Care | Payer: BC Managed Care – PPO | Source: Ambulatory Visit | Attending: Hematology and Oncology | Admitting: Hematology and Oncology

## 2019-08-08 DIAGNOSIS — Z170 Estrogen receptor positive status [ER+]: Secondary | ICD-10-CM

## 2019-08-08 DIAGNOSIS — C50411 Malignant neoplasm of upper-outer quadrant of right female breast: Secondary | ICD-10-CM

## 2019-08-08 MED ORDER — LORAZEPAM 0.5 MG PO TABS: 0.5000 mg | ORAL_TABLET | Freq: Every evening | ORAL | 3 refills | Status: DC | PRN

## 2019-08-08 MED ORDER — GADOBUTROL 1 MMOL/ML IV SOLN
10.0000 mL | Freq: Once | INTRAVENOUS | Status: AC | PRN
  Administered 2019-08-08: 10 mL via INTRAVENOUS

## 2019-08-10 ENCOUNTER — Other Ambulatory Visit: Payer: Self-pay | Admitting: *Deleted

## 2019-08-10 ENCOUNTER — Telehealth: Payer: Self-pay | Admitting: Hematology and Oncology

## 2019-08-10 ENCOUNTER — Telehealth: Payer: Self-pay | Admitting: *Deleted

## 2019-08-10 DIAGNOSIS — C50411 Malignant neoplasm of upper-outer quadrant of right female breast: Secondary | ICD-10-CM

## 2019-08-10 NOTE — Telephone Encounter (Signed)
Received call from pt with complaint of diarrhea x 1 day.  Pt states she is very fatigued and weak and is wanting to be seen for possible dehydration.  Pt able to be seen tomorrow am in [facility] clinic. Per MD pt to start taking OTC imodium as directed on the label to help control diarrhea until she is seen in the office tomorrow.  Pt also educated to increase oral fluids with water and Gatorade.  Pt verbalized understanding.  High priority message sent to scheduling.

## 2019-08-10 NOTE — Telephone Encounter (Signed)
Scheduled per 05/07 los, patient has been called regarding upcoming appointments. Patient is notified.

## 2019-08-11 ENCOUNTER — Inpatient Hospital Stay (HOSPITAL_BASED_OUTPATIENT_CLINIC_OR_DEPARTMENT_OTHER): Payer: BC Managed Care – PPO | Admitting: Medical

## 2019-08-11 ENCOUNTER — Inpatient Hospital Stay: Payer: BC Managed Care – PPO

## 2019-08-11 ENCOUNTER — Other Ambulatory Visit: Payer: Self-pay

## 2019-08-11 ENCOUNTER — Ambulatory Visit: Payer: BC Managed Care – PPO | Admitting: Family Medicine

## 2019-08-11 VITALS — BP 142/83 | HR 92 | Temp 98.3°F | Resp 18 | Ht 65.0 in | Wt 229.5 lb

## 2019-08-11 DIAGNOSIS — C50411 Malignant neoplasm of upper-outer quadrant of right female breast: Secondary | ICD-10-CM | POA: Diagnosis not present

## 2019-08-11 DIAGNOSIS — Z95828 Presence of other vascular implants and grafts: Secondary | ICD-10-CM

## 2019-08-11 DIAGNOSIS — R110 Nausea: Secondary | ICD-10-CM | POA: Diagnosis not present

## 2019-08-11 DIAGNOSIS — E860 Dehydration: Secondary | ICD-10-CM | POA: Diagnosis not present

## 2019-08-11 DIAGNOSIS — Z170 Estrogen receptor positive status [ER+]: Secondary | ICD-10-CM

## 2019-08-11 LAB — CBC WITH DIFFERENTIAL (CANCER CENTER ONLY)
Abs Immature Granulocytes: 0 10*3/uL (ref 0.00–0.07)
Band Neutrophils: 2 %
Basophils Absolute: 0 10*3/uL (ref 0.0–0.1)
Basophils Relative: 0 %
Eosinophils Absolute: 0 10*3/uL (ref 0.0–0.5)
Eosinophils Relative: 0 %
HCT: 36.3 % (ref 36.0–46.0)
Hemoglobin: 12.2 g/dL (ref 12.0–15.0)
Lymphocytes Relative: 67 %
Lymphs Abs: 2.6 10*3/uL (ref 0.7–4.0)
MCH: 31.7 pg (ref 26.0–34.0)
MCHC: 33.6 g/dL (ref 30.0–36.0)
MCV: 94.3 fL (ref 80.0–100.0)
Monocytes Absolute: 0.7 10*3/uL (ref 0.1–1.0)
Monocytes Relative: 18 %
Neutro Abs: 0.6 10*3/uL — ABNORMAL LOW (ref 1.7–7.7)
Neutrophils Relative %: 13 %
Platelet Count: 306 10*3/uL (ref 150–400)
RBC: 3.85 MIL/uL — ABNORMAL LOW (ref 3.87–5.11)
RDW: 13.8 % (ref 11.5–15.5)
WBC Count: 3.9 10*3/uL — ABNORMAL LOW (ref 4.0–10.5)
nRBC: 0.5 % — ABNORMAL HIGH (ref 0.0–0.2)

## 2019-08-11 LAB — CMP (CANCER CENTER ONLY)
ALT: 20 U/L (ref 0–44)
AST: 20 U/L (ref 15–41)
Albumin: 3.8 g/dL (ref 3.5–5.0)
Alkaline Phosphatase: 57 U/L (ref 38–126)
Anion gap: 12 (ref 5–15)
BUN: 9 mg/dL (ref 6–20)
CO2: 22 mmol/L (ref 22–32)
Calcium: 9.3 mg/dL (ref 8.9–10.3)
Chloride: 100 mmol/L (ref 98–111)
Creatinine: 1.03 mg/dL — ABNORMAL HIGH (ref 0.44–1.00)
GFR, Est AFR Am: >60 mL/min (ref >60)
GFR, Estimated: >60 mL/min (ref >60)
Glucose, Bld: 137 mg/dL — ABNORMAL HIGH (ref 70–99)
Potassium: 4.1 mmol/L (ref 3.5–5.1)
Sodium: 134 mmol/L — ABNORMAL LOW (ref 135–145)
Total Bilirubin: 0.4 mg/dL (ref 0.3–1.2)
Total Protein: 7.4 g/dL (ref 6.5–8.1)

## 2019-08-11 LAB — MAGNESIUM: Magnesium: 1.7 mg/dL (ref 1.7–2.4)

## 2019-08-11 MED ORDER — HEPARIN SOD (PORK) LOCK FLUSH 100 UNIT/ML IV SOLN
500.0000 [IU] | Freq: Once | INTRAVENOUS | Status: AC
  Administered 2019-08-11: 500 [IU]
  Filled 2019-08-11: qty 5

## 2019-08-11 MED ORDER — SODIUM CHLORIDE 0.9% FLUSH
10.0000 mL | Freq: Once | INTRAVENOUS | Status: AC
  Administered 2019-08-11: 10 mL
  Filled 2019-08-11: qty 10

## 2019-08-11 MED ORDER — ONDANSETRON 8 MG PO TBDP: 8.0000 mg | ORAL_TABLET | Freq: Three times a day (TID) | ORAL | 0 refills | Status: DC | PRN

## 2019-08-11 MED ORDER — SODIUM CHLORIDE 0.9 % IV SOLN
10.0000 mg | Freq: Once | INTRAVENOUS | Status: AC
  Administered 2019-08-11: 10 mg via INTRAVENOUS
  Filled 2019-08-11: qty 10

## 2019-08-11 MED ORDER — SODIUM CHLORIDE 0.9 % IV SOLN
Freq: Once | INTRAVENOUS | Status: AC
  Filled 2019-08-11: qty 250

## 2019-08-11 MED ORDER — SODIUM CHLORIDE 0.9 % IV SOLN
INTRAVENOUS | Status: DC
  Filled 2019-08-11 (×2): qty 250

## 2019-08-11 NOTE — Progress Notes (Signed)
Patient presents today with nausea w/o vomiting, diarrhea, and fatigue. HR elevated. Seen by PA. New orders placed for IVF and Decadron, administered by RN. Food and drink offered and accepted by patient. Patient declined need for restroom.   At discharge, VSS and patient states she is feeling much better. RN advised patient to call if she continues to experience symptoms or with any new symptoms/side effects. Patient verbalized understanding.

## 2019-08-11 NOTE — Patient Instructions (Signed)

## 2019-08-11 NOTE — Progress Notes (Signed)
Symptoms Management Clinic Progress Note   Heidi Costa [id number] Jul 06, 1980 39 y.o.  Heidi Costa is managed by Dr. Nicholas Lose  Actively treated with chemotherapy/immunotherapy/hormonal therapy: yes  Current therapy: Ogivri, Perjeta, Taxotere, and carboplatin with Fulphila support  Last treated: 08/04/2019 (cycle 2, day 1)  Next scheduled appointment with provider: 08/25/2019  Assessment: Plan:    Dehydration - Plan: 0.9 %  sodium chloride infusion, 0.9 %  sodium chloride infusion  Nausea without vomiting - Plan: ondansetron (ZOFRAN ODT) 8 MG disintegrating tablet, dexamethasone (DECADRON) 10 mg in sodium chloride 0.9 % 50 mL IVPB  Port-A-Cath in place - Plan: heparin lock flush 100 unit/mL, sodium chloride flush (NS) 0.9 % injection 10 mL  Malignant neoplasm of upper-outer quadrant of right breast in female, estrogen receptor positive ([place]) - Plan: heparin lock flush 100 unit/mL, sodium chloride flush (NS) 0.9 % injection 10 mL   Dehydration: Patient was given 1 L normal saline IV today.  Nausea: Patient was given Decadron 10 mg IV today.  ER positive malignant neoplasm of the right breast: The patient continues to be managed by Dr. Nicholas Lose and is status post cycle 2, day 1 of Ogivri, Perjeta, Taxotere, and carboplatin with Fulphila support.  She is scheduled to be seen next on 08/25/2019.   Please see After Visit Summary for patient specific instructions.  Future Appointments  Date Time Provider [place]  08/25/2019  8:45 AM [facility]FLUSH [facility] None  08/25/2019  9:00 AM [facility] [place] FLUSH [facility] None  08/25/2019  9:30 AM [place] Phlegm, NP [facility] None  08/25/2019 10:00 AM [facility] INFUSION [facility] None  08/29/2019 12:00 PM [facility] [place] FLUSH [facility] None  09/01/2019  2:20 PM Rutherford Guys, MD [facility]  09/14/2019  9:30 AM [facility] LAB 5 [facility] None  09/14/2019  9:45 AM [facility] [place] FLUSH [facility]  None  09/14/2019 10:15 AM Nicholas Lose, MD [facility] None  09/14/2019 11:15 AM [facility] INFUSION [facility] None  09/16/2019 12:30 PM [facility] [place] FLUSH [facility] None  10/05/2019  9:45 AM [facility] LAB 4 [facility] None  10/05/2019 10:00 AM [facility] [place] None  10/05/2019 10:30 AM Nicholas Lose, MD [facility] None  10/05/2019 11:00 AM [facility] INFUSION [facility] None  10/07/2019 10:30 AM [facility] [place] FLUSH [facility] None  10/17/2019  9:40 AM Rutherford Guys, MD PCP-PCP [facility]  10/26/2019 10:30 AM [facility] LAB 2 [facility] None  10/26/2019 10:45 AM [facility] [place] FLUSH [facility] None  10/26/2019 11:15 AM Nicholas Lose, MD [facility] None  10/26/2019 12:15 PM [facility] INFUSION [facility] None  10/28/2019 12:30 PM [facility] [place] FLUSH [facility] None    No orders of the defined types were placed in this encounter.      Subjective:   Patient ID:  Heidi Costa is a 39 y.o. (DOB [birth year]) female.  Chief Complaint:  Chief Complaint  Patient presents with  . Diarrhea    HPI Heidi Costa  is a 39 y.o. female with a diagnosis of an ER positive malignant neoplasm of the right breast.  She is managed by Dr. Nicholas Lose and is status post cycle 2, day 1 of Ogivri, Perjeta, Taxotere, and carboplatin with Fulphila support.  She presents to the clinic today with nausea, vomiting, anorexia, and diarrhea.  She also has weakness, fatigue, and recent abdominal pain which is resolved.  She began taking Imodium on Thursday with improvement in her diarrhea.  She denies fevers, chills, or sweats.   Medications: I have reviewed the patient's  current medications.  Allergies:  Allergies  Allergen Reactions  . Aspirin Rash    Past Medical History:  Diagnosis Date  . Abnormality of gait 08/02/2012   resolved per patient on 07/12/19  . Anemia    iron taking in the past, not currently  . Anxiety   . Arthritis    spine- cerv.  & arm   . Asthma   . Bulging lumbar  disc   . Cancer [facility])    Right Breast-starts chemo on 07/14/19  . Chronic arthralgias of knees and hips 08/02/2012  . Depression    Pt. took self off Celexa one month ago, states she did n't like how it made her feel .  Marland Kitchen Family history of cancer   . GERD (gastroesophageal reflux disease)    diet controlled  . Heart murmur    pt. been told that this is a fact, no echo or cardiac surveillance in the past   . HLD (hyperlipidemia)    no meds, diet controlled  . Hypertension   . Obesity   . Spinal stenosis     Past Surgical History:  Procedure Laterality Date  . ANTERIOR CERVICAL DECOMP/DISCECTOMY FUSION N/A 10/27/2012   Procedure: ANTERIOR CERVICAL DECOMPRESSION/DISCECTOMY FUSION 1 LEVEL;  Surgeon: Faythe Ghee, MD;  Location: [place] NEURO ORS;  Service: Neurosurgery;  Laterality: N/A;  ANTERIOR CERVICAL DECOMPRESSION/DISCECTOMY FUSION 1 LEVEL  . CHALAZION EXCISION     L eye   . PORTACATH PLACEMENT Left 07/13/2019   Procedure: INSERTION PORT-A-CATH WITH ULTRASOUND;  Surgeon: Rolm Bookbinder, MD;  Location: [place];  Service: General;  Laterality: Left;  Marland Kitchen VAGINAL DELIVERY  2002  . WISDOM TOOTH EXTRACTION      Family History  Problem Relation Age of [place]  . Osteoarthritis Mother   . Hypertension Mother   . Diabetes Mother   . Hypertension Father   . Gout Father   . Stroke Father 65       embolic  . Diabetes Half-Brother   . Hypertension Half-Brother   . Heart failure Half-Brother 45  . Kidney disease Half-Brother   . Diabetes Paternal Grandfather   . Cancer Paternal Grandfather 76       breast cancer    Social History   Socioeconomic History  . Marital status: Married    Spouse name: Not on file  . Number of children: 1  . Years of education: associates  . Highest education level: Not on file  Occupational History    Employer: OTHER  Tobacco Use  . Smoking status: Former Smoker    Packs/day: 1.00    Years: 10.00    Pack years: 10.00    Types: Cigarettes    Quit  date: 06/25/2019    Years since quitting: 0.1  . Smokeless tobacco: Never Used  Substance and Sexual Activity  . Alcohol use: Yes    Comment: occas  . Drug use: Yes    Types: Marijuana    Comment: uses daily - last use 07/12/19  . Sexual activity: Yes    Birth control/protection: None  Other Topics Concern  . Not on file  Social History Narrative   Patient is married with one child.   Patient is right handed.   Patient has a Associate's degree.   Patient drinks 2-3 cups daily.   Social Determinants of Health   Financial Resource Strain:   . Difficulty of Paying Living Expenses:   Food Insecurity:   . Worried About Charity fundraiser in  the Last Year:   . [place] in the Last Year:   Transportation Needs:   . Film/video editor (Medical):   Marland Kitchen Lack of Transportation (Non-Medical):   Physical Activity:   . Days of Exercise per Week:   . Minutes of Exercise per Session:   Stress:   . Feeling of Stress :   Social Connections:   . Frequency of Communication with Friends and Family:   . Frequency of Social Gatherings with Friends and Family:   . Attends Religious Services:   . Active Member of Clubs or Organizations:   . Attends Archivist Meetings:   Marland Kitchen Marital Status:   Intimate Partner Violence:   . Fear of Current or Ex-Partner:   . Emotionally Abused:   Marland Kitchen Physically Abused:   . Sexually Abused:     Past Medical History, Surgical history, Social history, and Family history were reviewed and updated as appropriate.   Please see review of systems for further details on the patient's review from today.   Review of Systems:  Review of Systems  Constitutional: Positive for appetite change and fatigue. Negative for chills, diaphoresis and fever.  HENT: Negative for trouble swallowing.   Respiratory: Negative for cough, chest tightness and shortness of breath.   Cardiovascular: Negative for chest pain, palpitations and leg swelling.    Gastrointestinal: Positive for diarrhea, nausea and vomiting. Negative for abdominal distention, abdominal pain, blood in stool and constipation.  Genitourinary: Negative for decreased urine volume and difficulty urinating.  Neurological: Positive for weakness.    Objective:   Physical Exam:  BP (!) 142/83 (BP Location: Left Arm, Patient Position: Sitting)   Pulse 92   Temp 98.3 F (36.8 C) (Oral)   Resp 18   Ht 5\' 5"  (1.651 m)   Wt 104.1 kg (229 lb 8 oz)   SpO2 100%   BMI 38.19 kg/m  ECOG: 0  Physical Exam Constitutional:      General: She is not in acute distress.    Appearance: She is not diaphoretic.  HENT:     Head: Normocephalic and atraumatic.     Mouth/Throat:     Mouth: Mucous membranes are moist.     Pharynx: Oropharynx is clear.  Eyes:     General: No scleral icterus.       Right eye: No discharge.        Left eye: No discharge.     Conjunctiva/sclera: Conjunctivae normal.  Cardiovascular:     Rate and Rhythm: Normal rate and regular rhythm.     Heart sounds: Normal heart sounds. No murmur. No friction rub. No gallop.   Pulmonary:     Effort: Pulmonary effort is normal. No respiratory distress.     Breath sounds: Normal breath sounds. No stridor. No wheezing or rales.  Abdominal:     General: Bowel sounds are normal. There is no distension.     Palpations: Abdomen is soft. There is no mass.     Tenderness: There is no abdominal tenderness. There is no guarding or rebound.  Skin:    General: Skin is warm and dry.  Neurological:     Mental Status: She is alert.     Coordination: Coordination normal.     Gait: Gait normal.  Psychiatric:        Mood and Affect: Mood normal.        Behavior: Behavior normal.        Thought Content: Thought content normal.  Judgment: Judgment normal.     Lab Review:     Component Value Date/Time   NA 134 (L) 08/11/2019 0833   NA 141 06/06/2019 1545   K 4.1 08/11/2019 0833   CL 100 08/11/2019 0833   CO2 22  08/11/2019 0833   GLUCOSE 137 (H) 08/11/2019 0833   BUN 9 08/11/2019 0833   BUN 7 06/06/2019 1545   CREATININE 1.03 (H) 08/11/2019 0833   CREATININE 0.79 07/11/2015 1905   CALCIUM 9.3 08/11/2019 0833   PROT 7.4 08/11/2019 0833   PROT 6.5 06/06/2019 1545   ALBUMIN 3.8 08/11/2019 0833   ALBUMIN 4.4 06/06/2019 1545   AST 20 08/11/2019 0833   ALT 20 08/11/2019 0833   ALKPHOS 57 08/11/2019 0833   BILITOT 0.4 08/11/2019 0833   GFRNONAA >60 08/11/2019 0833   GFRNONAA >89 07/11/2015 1905   GFRAA >60 08/11/2019 0833   GFRAA >89 07/11/2015 1905       Component Value Date/Time   WBC 3.9 (L) 08/11/2019 0833   WBC 6.2 10/25/2012 1400   RBC 3.85 (L) 08/11/2019 0833   HGB 12.2 08/11/2019 0833   HCT 36.3 08/11/2019 0833   PLT 306 08/11/2019 0833   MCV 94.3 08/11/2019 0833   MCV 93.1 07/11/2015 1908   MCH 31.7 08/11/2019 0833   MCHC 33.6 08/11/2019 0833   RDW 13.8 08/11/2019 0833   LYMPHSABS 2.6 08/11/2019 0833   MONOABS 0.7 08/11/2019 0833   EOSABS 0.0 08/11/2019 0833   BASOSABS 0.0 08/11/2019 0833   -------------------------------  Imaging from last 24 hours (if applicable):  Radiology interpretation: MR BREAST RIGHT W WO CONTRAST INC CAD  Result Date: 07/19/2019 CLINICAL DATA:  39 year old patient presented today for 2 MRI-guided biopsies of the right breast to assess for extent of disease. 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 On her breast MRI dated July 07, 2019, extensive nonmass enhancement was identified in the lateral right breast, in addition to the biopsy-proven malignant mass in the central right breast. The patient stated prior to today's visit that she felt a little bit queasy, and she was given some ginger ale and a few crackers before the exam. She was then ready to proceed. After contrast was injected and images were performed, and the biopsy locations were being planned, the patient said she did not feel like she  could go through with the biopsies today. She was slightly diaphoretic and feeling anxious. Therefore, the biopsies were canceled. LABS:  None obtained EXAM: MR OF THE RIGHT BREAST WITH AND WITHOUT CONTRAST TECHNIQUE: Multiplanar, multisequence MR images of the right breast were obtained prior to and following the intravenous administration of 9 ml of Gadavist. Three-dimensional MR images were rendered by post-processing of the original MR data on an independent workstation. The three-dimensional MR images were interpreted, and findings are reported in the following complete MRI report for this study. Three dimensional images were evaluated at the independent DynaCad workstation COMPARISON:  Recent breast MRI FINDINGS: Breast composition: Scattered fibroglandular tissue. Background parenchymal enhancement: Mild Right breast: There is an enhancing centrally necrotic mass in the central right breast corresponding to the biopsy-proven malignancy. Again seen is nonmass enhancement involving majority of the lateral right breast with areas of sparing. Skin thickening of the right breast is noted. Lymph nodes: Pathologically enlarged and heterogeneously enhancing right axillary lymph node is partially imaged, and corresponds to the biopsy-proven malignant axillary lymph node. Ancillary findings:  None. IMPRESSION: Planned right breast  MRI guided biopsies were canceled due to patient request today. Images were obtained after contrast administration. After that, the patient declined further intervention today. An EPIC message was sent to Dr. Lindi Adie. Please note there is skin thickening of the right breast particularly laterally and anteriorly. RECOMMENDATION: Follow-up with Dr. Lindi Adie. Another attempt at MRI-guided biopsies can be scheduled, if desired. BI-RADS CATEGORY  4: Suspicious. Electronically Signed   By: Curlene Dolphin M.D.   On: 07/19/2019 10:11   MR BREAST BILATERAL W WO CONTRAST INC CAD  Result Date:  08/08/2019 CLINICAL DATA:  The patient was diagnosed with right breast cancer in March of 2021. A breast MRI dated July 07, 2027 demonstrated the known breast cancer in addition to 12.8 cm suspicious non mass enhancement in the entire lateral right breast from anterior to posterior depth. On July 19, 2019, the patient presented for a biopsy of the non mass enhancement. However, the procedure was canceled as the patient was not feeling well. The patient is currently on chemotherapy. The patient presents for biopsy of the non mass enhancement today. LABS:  None available EXAM: BILATERAL BREAST MRI WITH AND WITHOUT CONTRAST TECHNIQUE: Multiplanar, multisequence MR images of the right breast was obtained prior to and following the intravenous administration of 10 ml of Gadavist Three-dimensional MR images were rendered by post-processing of the original MR data on an independent workstation. The three-dimensional MR images were interpreted, and findings are reported in the following complete MRI report for this study. Three dimensional images were evaluated at the independent DynaCad workstation COMPARISON:  July 07, 2019 and July 19, 2019 FINDINGS: Breast composition: b. Scattered fibroglandular tissue. Background parenchymal enhancement: Minimal Right breast: The patient's known malignancy is again identified in the right breast. The previously identified non mass enhancement in the lateral right breast from anterior to posterior depth has resolved, likely due to interval chemotherapy. No discrete target was identified today. Lymph nodes: The patient has known metastatic right axillary lymph node remains and contains a biopsy clip. Ancillary findings:  None. IMPRESSION: The non mass enhancement in the lateral right breast from anterior to posterior depth has resolved, likely due to interval chemotherapy. A discrete target for the non mass enhancement was not identified today and a biopsy was not performed today.  RECOMMENDATION: Recommend continued surgical and oncologic follow up. BI-RADS CATEGORY  6: Known biopsy-proven malignancy. Electronically Signed   By: Dorise Bullion III M.D   On: 08/08/2019 10:21   DG Chest Port 1 View  Result Date: 07/13/2019 CLINICAL DATA:  Status post Port-A-Cath EXAM: PORTABLE CHEST 1 VIEW COMPARISON:  07/18/2015 FINDINGS: Left Port-A-Cath has been placed with the tip in the SVC. No pneumothorax. Heart and mediastinal contours are within normal limits. No focal opacities or effusions. No acute bony abnormality. IMPRESSION: Left Port-A-Cath insertion, tip SVC. No pneumothorax. No active disease. Electronically Signed   By: Rolm Baptise M.D.   On: 07/13/2019 10:22   DG Fluoro Guide CV Line-No Report  Result Date: 07/13/2019 Fluoroscopy was utilized by the requesting physician.  No radiographic interpretation.

## 2019-08-21 ENCOUNTER — Encounter: Payer: Self-pay | Admitting: *Deleted

## 2019-08-21 NOTE — Progress Notes (Signed)
Pharmacist Chemotherapy Monitoring - Follow Up Assessment    I verify that I have reviewed each item in the below checklist:  . Regimen for the patient is scheduled for the appropriate day and plan matches scheduled date. Marland Kitchen Appropriate non-routine labs are ordered dependent on drug ordered. . If applicable, additional medications reviewed and ordered per protocol based on lifetime cumulative doses and/or treatment regimen.   Plan for follow-up and/or issues identified: Yes . I-vent associated with next due treatment: Yes . MD and/or nursing notified: No   Kennith Center, Pharm.D., CPP 08/21/2019@8 :47 AM

## 2019-08-25 ENCOUNTER — Inpatient Hospital Stay: Payer: BC Managed Care – PPO

## 2019-08-25 ENCOUNTER — Inpatient Hospital Stay (HOSPITAL_BASED_OUTPATIENT_CLINIC_OR_DEPARTMENT_OTHER): Payer: BC Managed Care – PPO | Admitting: Adult Health

## 2019-08-25 ENCOUNTER — Other Ambulatory Visit: Payer: Self-pay

## 2019-08-25 ENCOUNTER — Encounter: Payer: Self-pay | Admitting: Adult Health

## 2019-08-25 VITALS — BP 139/74 | HR 85 | Temp 98.9°F | Resp 18 | Ht 64.0 in | Wt 247.2 lb

## 2019-08-25 DIAGNOSIS — C50411 Malignant neoplasm of upper-outer quadrant of right female breast: Secondary | ICD-10-CM

## 2019-08-25 DIAGNOSIS — Z170 Estrogen receptor positive status [ER+]: Secondary | ICD-10-CM | POA: Diagnosis not present

## 2019-08-25 LAB — CMP (CANCER CENTER ONLY)
ALT: 19 U/L (ref 0–44)
AST: 15 U/L (ref 15–41)
Albumin: 3.2 g/dL — ABNORMAL LOW (ref 3.5–5.0)
Alkaline Phosphatase: 54 U/L (ref 38–126)
Anion gap: 12 (ref 5–15)
BUN: 11 mg/dL (ref 6–20)
CO2: 22 mmol/L (ref 22–32)
Calcium: 8.7 mg/dL — ABNORMAL LOW (ref 8.9–10.3)
Chloride: 110 mmol/L (ref 98–111)
Creatinine: 0.86 mg/dL (ref 0.44–1.00)
GFR, Est AFR Am: >60 mL/min (ref >60)
GFR, Estimated: >60 mL/min (ref >60)
Glucose, Bld: 146 mg/dL — ABNORMAL HIGH (ref 70–99)
Potassium: 4 mmol/L (ref 3.5–5.1)
Sodium: 144 mmol/L (ref 135–145)
Total Bilirubin: 0.2 mg/dL — ABNORMAL LOW (ref 0.3–1.2)
Total Protein: 5.8 g/dL — ABNORMAL LOW (ref 6.5–8.1)

## 2019-08-25 LAB — CBC WITH DIFFERENTIAL (CANCER CENTER ONLY)
Abs Immature Granulocytes: 0 10*3/uL (ref 0.00–0.07)
Basophils Absolute: 0 10*3/uL (ref 0.0–0.1)
Basophils Relative: 0 %
Eosinophils Absolute: 0 10*3/uL (ref 0.0–0.5)
Eosinophils Relative: 1 %
HCT: 31 % — ABNORMAL LOW (ref 36.0–46.0)
Hemoglobin: 10.1 g/dL — ABNORMAL LOW (ref 12.0–15.0)
Immature Granulocytes: 0 %
Lymphocytes Relative: 38 %
Lymphs Abs: 1.8 10*3/uL (ref 0.7–4.0)
MCH: 32.6 pg (ref 26.0–34.0)
MCHC: 32.6 g/dL (ref 30.0–36.0)
MCV: 100 fL (ref 80.0–100.0)
Monocytes Absolute: 0.5 10*3/uL (ref 0.1–1.0)
Monocytes Relative: 11 %
Neutro Abs: 2.4 10*3/uL (ref 1.7–7.7)
Neutrophils Relative %: 50 %
Platelet Count: 287 10*3/uL (ref 150–400)
RBC: 3.1 MIL/uL — ABNORMAL LOW (ref 3.87–5.11)
RDW: 15.8 % — ABNORMAL HIGH (ref 11.5–15.5)
WBC Count: 4.8 10*3/uL (ref 4.0–10.5)
nRBC: 0 % (ref 0.0–0.2)

## 2019-08-25 LAB — PREGNANCY, URINE: Preg Test, Ur: NEGATIVE

## 2019-08-25 MED ORDER — PALONOSETRON HCL INJECTION 0.25 MG/5ML
0.2500 mg | Freq: Once | INTRAVENOUS | Status: AC
  Administered 2019-08-25: 0.25 mg via INTRAVENOUS

## 2019-08-25 MED ORDER — ACETAMINOPHEN 325 MG PO TABS
ORAL_TABLET | ORAL | Status: AC
  Filled 2019-08-25: qty 2

## 2019-08-25 MED ORDER — PALONOSETRON HCL INJECTION 0.25 MG/5ML
INTRAVENOUS | Status: AC
  Filled 2019-08-25: qty 5

## 2019-08-25 MED ORDER — ACETAMINOPHEN 325 MG PO TABS
650.0000 mg | ORAL_TABLET | Freq: Once | ORAL | Status: AC
  Administered 2019-08-25: 650 mg via ORAL

## 2019-08-25 MED ORDER — SODIUM CHLORIDE 0.9 % IV SOLN
Freq: Once | INTRAVENOUS | Status: AC
  Filled 2019-08-25: qty 250

## 2019-08-25 MED ORDER — TRASTUZUMAB-DKST CHEMO 150 MG IV SOLR
6.0000 mg/kg | Freq: Once | INTRAVENOUS | Status: AC
  Administered 2019-08-25: 672 mg via INTRAVENOUS
  Filled 2019-08-25: qty 32

## 2019-08-25 MED ORDER — DIPHENHYDRAMINE HCL 25 MG PO CAPS
ORAL_CAPSULE | ORAL | Status: AC
  Filled 2019-08-25: qty 1

## 2019-08-25 MED ORDER — SODIUM CHLORIDE 0.9 % IV SOLN
600.0000 mg | Freq: Once | INTRAVENOUS | Status: AC
  Administered 2019-08-25: 600 mg via INTRAVENOUS
  Filled 2019-08-25: qty 60

## 2019-08-25 MED ORDER — DIPHENHYDRAMINE HCL 25 MG PO CAPS
25.0000 mg | ORAL_CAPSULE | Freq: Once | ORAL | Status: AC
  Administered 2019-08-25: 25 mg via ORAL

## 2019-08-25 MED ORDER — SODIUM CHLORIDE 0.9 % IV SOLN
150.0000 mg | Freq: Once | INTRAVENOUS | Status: AC
  Administered 2019-08-25: 150 mg via INTRAVENOUS
  Filled 2019-08-25: qty 150

## 2019-08-25 MED ORDER — SODIUM CHLORIDE 0.9% FLUSH
10.0000 mL | INTRAVENOUS | Status: DC | PRN
  Administered 2019-08-25: 10 mL
  Filled 2019-08-25: qty 10

## 2019-08-25 MED ORDER — OXYCODONE-ACETAMINOPHEN 5-325 MG PO TABS: 1.0000 | ORAL_TABLET | Freq: Three times a day (TID) | ORAL | 0 refills | Status: DC | PRN

## 2019-08-25 MED ORDER — SODIUM CHLORIDE 0.9 % IV SOLN
10.0000 mg | Freq: Once | INTRAVENOUS | Status: AC
  Administered 2019-08-25: 10 mg via INTRAVENOUS
  Filled 2019-08-25: qty 10

## 2019-08-25 MED ORDER — HEPARIN SOD (PORK) LOCK FLUSH 100 UNIT/ML IV SOLN
500.0000 [IU] | Freq: Once | INTRAVENOUS | Status: AC | PRN
  Administered 2019-08-25: 500 [IU]
  Filled 2019-08-25: qty 5

## 2019-08-25 MED ORDER — SODIUM CHLORIDE 0.9 % IV SOLN
65.0000 mg/m2 | Freq: Once | INTRAVENOUS | Status: AC
  Administered 2019-08-25: 150 mg via INTRAVENOUS
  Filled 2019-08-25: qty 15

## 2019-08-25 MED ORDER — SODIUM CHLORIDE 0.9 % IV SOLN
420.0000 mg | Freq: Once | INTRAVENOUS | Status: AC
  Administered 2019-08-25: 420 mg via INTRAVENOUS
  Filled 2019-08-25: qty 14

## 2019-08-25 NOTE — Assessment & Plan Note (Addendum)
06/21/2019:Patient palpated a painful right breast lump x3 weeks. Mammogram showed a 2.0cm right breast mass at the 10 o'clock position, a 3.6cm enlarged right axillary lymph node, and a 0.5cm left breast mass. Biopsy showed, in the right breast and axilla(lymph node), IDC, grade 3, HER-2 equivocal by IHC, + by FISH, ER+ 40%, PR - 0%, Ki67 80%,  left breast, fibrocystic changes, no malignancy.Can be followed.  Treatment plan: 1. Neoadjuvant chemotherapy with TCH Perjeta 6 cycles followed by HerceptinPerjeta versus Kadcylamaintenance for 1 year 2. Followed by breast conserving surgery if possible with sentinel lymph node study 3. Followed by adjuvant radiation therapy if patient had lumpectomy 4.Consideration for neratinib Genetic counseling Breast MRI on 07/07/19 showed the 2.9cm biopsy-proven malignancy in the right breast and axilla, highly suspicious non-mass enhancement in the lateral right breast spanning 14.5cm, and no left breast malignancy ----------------------------------------------------------------------------------------------------------------------------------------------------------- Current treatment: Cycle 3 day 1 TCH Perjeta.  Chemotherapy started 07/12/2019 Echocardiogram 07/12/2019 EF 60-65%  Chemo toxicities: 1.  Diarrhea required IV fluids: Dose reduced with cycle 2 2.  Fatigue 3.  Nausea 4. Post Neulasta pain: sent in Percocet #15, and directed her to continue with heating pad, one tylenol and one aleve as  Much as TID PRN, and percocet for the week after her neulasta BID PRN.  She understands this.   5. Monitoring closely for peripheral neuropathy which she hasn't developed.    Return to clinic in 3 weeks for cycle 4 of treatment.

## 2019-08-25 NOTE — Progress Notes (Signed)
[facility] Follow up:    Heidi Guys, MD [address] Dr. Lady [place] [address]   DIAGNOSIS: Cancer Staging Malignant neoplasm of upper-outer quadrant of right breast in female, estrogen receptor positive ([place]) Staging form: Breast, AJCC 8th Edition - Clinical stage from 07/04/2019: Stage IIB (cT2, cN1(f), cM0, G3, ER+, PR-, HER2+) - Signed by Heidi Lose, MD on 07/05/2019   SUMMARY OF ONCOLOGIC HISTORY: Oncology History  Malignant neoplasm of upper-outer quadrant of right breast in female, estrogen receptor positive ([place])  06/21/2019 Initial Diagnosis   Patient palpated a painful right breast lump x3 weeks. Mammogram showed a 2.0cm right breast mass at the 10 o'clock position, a 3.6cm enlarged right axillary lymph node, and a 0.5cm left breast mass. Biopsy showed, in the right breast and axilla, IDC, grade 3, HER-2 equivocal by IHC, + by FISH, ER+ 40%, PR - 0%, Ki67 80%, and in the left breast, fibrocystic changes, no malignancy.    07/04/2019 Cancer Staging   Staging form: Breast, AJCC 8th Edition - Clinical stage from 07/04/2019: Stage IIB (cT2, cN1(f), cM0, G3, ER+, PR-, HER2+) - Signed by Heidi Lose, MD on 07/05/2019   07/14/2019 -  Chemotherapy   The patient had palonosetron (ALOXI) injection 0.25 mg, 0.25 mg, Intravenous,  Once, 3 of 6 cycles Administration: 0.25 mg (07/14/2019), 0.25 mg (08/04/2019) pegfilgrastim-jmdb (FULPHILA) injection 6 mg, 6 mg, Subcutaneous,  Once, 3 of 6 cycles Administration: 6 mg (07/17/2019), 6 mg (08/07/2019) CARBOplatin (PARAPLATIN) 700 mg in sodium chloride 0.9 % 250 mL chemo infusion, 700 mg (100 % of original dose 700 mg), Intravenous,  Once, 3 of 6 cycles Dose modification: 700 mg (original dose 700 mg, Cycle 1, Reason: Provider Judgment), 600 mg (original dose 700 mg, Cycle 2, Reason: Dose not tolerated) Administration: 700 mg (07/14/2019), 600 mg (08/04/2019) DOCEtaxel (TAXOTERE) 170 mg in sodium chloride 0.9 % 250 mL chemo infusion,  75 mg/m2 = 170 mg, Intravenous,  Once, 3 of 6 cycles Dose modification: 65 mg/m2 (original dose 75 mg/m2, Cycle 2, Reason: Dose not tolerated) Administration: 170 mg (07/14/2019), 150 mg (08/04/2019) fosaprepitant (EMEND) 150 mg in sodium chloride 0.9 % 145 mL IVPB, 150 mg, Intravenous,  Once, 3 of 6 cycles Administration: 150 mg (07/14/2019), 150 mg (08/04/2019) pertuzumab (PERJETA) 840 mg in sodium chloride 0.9 % 250 mL chemo infusion, 840 mg, Intravenous, Once, 3 of 6 cycles Administration: 840 mg (07/14/2019), 420 mg (08/04/2019) trastuzumab-dkst (OGIVRI) 900 mg in sodium chloride 0.9 % 250 mL chemo infusion, 903 mg, Intravenous,  Once, 3 of 6 cycles Administration: 900 mg (07/14/2019), 672 mg (08/04/2019)  for chemotherapy treatment.    07/22/2019 Genetic Testing   Negative genetic testing:  No pathogenic variants detected on the [facility] Common Hereditary Cancers Panel. The report date is 07/22/2019.  The Common Hereditary Cancers Panel offered by [facility] includes sequencing and/or deletion duplication testing of the following 48 genes: APC, ATM, AXIN2, BARD1, BMPR1A, BRCA1, BRCA2, BRIP1, CDH1, CDK4, CDKN2A (p14ARF), CDKN2A (p16INK4a), CHEK2, CTNNA1, DICER1, EPCAM (Deletion/duplication testing only), GREM1 (promoter region deletion/duplication testing only), KIT, MEN1, MLH1, MSH2, MSH3, MSH6, MUTYH, NBN, NF1, NHTL1, PALB2, PDGFRA, PMS2, POLD1, POLE, PTEN, RAD50, RAD51C, RAD51D, RNF43, SDHB, SDHC, SDHD, SMAD4, SMARCA4. STK11, TP53, TSC1, TSC2, and VHL.  The following genes were evaluated for sequence changes only: SDHA and HOXB13 c.251G>A variant only.      CURRENT THERAPY: TCHP  INTERVAL HISTORY: Heidi Costa 39 y.o. female returns for evalatuion prior to her third cycle of TCHP.  She is doing moderately well.  At her last visit, Dr. Lindi Costa had written for 30 Percocet.  PMP aware reviewed.  She notes that her pain is most significant starting on her shot day.  She will lie on a heating pad, use  tylenol, aleve, or biofreeze.  She says she is taking the percocet BID for about a week after.  She has ran out of her Percocet and needs a refill prior to her next cycle.  She notes that she does not get overly tired with the percocet and it does help her to function more.    Heidi Costa also has diarrhea from the pertuzumab.  She says this has been better with the dose reduction, and she has been able to manage it with loperamide.  She denies any new issues today, particularly denies peripheral neuropathy.     Patient Active Problem List   Diagnosis Date Noted  . Port-A-Cath in place 08/04/2019  . Genetic testing 07/24/2019  . Family history of cancer   . Dysphagia 06/23/2019  . Malignant neoplasm of upper-outer quadrant of right breast in female, estrogen receptor positive ([place]) 06/21/2019  . Tobacco consumption 06/06/2019  . Breast mass, right 06/06/2019  . Sensation of change in body temperature 06/06/2019  . Neuropathic pain 06/06/2019  . Bite, insect 10/19/2016  . Swelling of eyelid, left 10/19/2016  . Essential hypertension 08/10/2016  . Disturbance of skin sensation 05/08/2013  . Cervical spondylosis with myelopathy 05/08/2013  . Abnormality of gait 08/02/2012  . Chronic arthralgias of knees and hips 08/02/2012    is allergic to aspirin.  MEDICAL HISTORY: Past Medical History:  Diagnosis Date  . Abnormality of gait 08/02/2012   resolved per patient on 07/12/19  . Anemia    iron taking in the past, not currently  . Anxiety   . Arthritis    spine- cerv.  & arm   . Asthma   . Bulging lumbar disc   . Cancer [facility])    Right Breast-starts chemo on 07/14/19  . Chronic arthralgias of knees and hips 08/02/2012  . Depression    Pt. took self off Celexa one month ago, states she did n't like how it made her feel .  Marland Kitchen Family history of cancer   . GERD (gastroesophageal reflux disease)    diet controlled  . Heart murmur    pt. been told that this is a fact, no echo or cardiac  surveillance in the past   . HLD (hyperlipidemia)    no meds, diet controlled  . Hypertension   . Obesity   . Spinal stenosis     SURGICAL HISTORY: Past Surgical History:  Procedure Laterality Date  . ANTERIOR CERVICAL DECOMP/DISCECTOMY FUSION N/A 10/27/2012   Procedure: ANTERIOR CERVICAL DECOMPRESSION/DISCECTOMY FUSION 1 LEVEL;  Surgeon: Faythe Ghee, MD;  Location: [facility];  Service: Neurosurgery;  Laterality: N/A;  ANTERIOR CERVICAL DECOMPRESSION/DISCECTOMY FUSION 1 LEVEL  . CHALAZION EXCISION     L eye   . PORTACATH PLACEMENT Left 07/13/2019   Procedure: INSERTION PORT-A-CATH WITH ULTRASOUND;  Surgeon: Rolm Bookbinder, MD;  Location: [facility];  Service: General;  Laterality: Left;  Marland Kitchen VAGINAL DELIVERY  2002  . WISDOM TOOTH EXTRACTION      SOCIAL HISTORY: Social History   Socioeconomic History  . Marital status: Married    Spouse name: Not on file  . Number of children: 1  . Years of education: associates  . Highest education level: Not on file  Occupational History  Employer: OTHER  Tobacco Use  . Smoking status: Former Smoker    Packs/day: 1.00    Years: 10.00    Pack years: 10.00    Types: Cigarettes    Quit date: 06/25/2019    Years since quitting: 0.1  . Smokeless tobacco: Never Used  Substance and Sexual Activity  . Alcohol use: Yes    Comment: occas  . Drug use: Yes    Types: Marijuana    Comment: uses daily - last use 07/12/19  . Sexual activity: Yes    Birth control/protection: None  Other Topics Concern  . Not on file  Social History Narrative   Patient is married with one child.   Patient is right handed.   Patient has a Associate's degree.   Patient drinks 2-3 cups daily.   Social Determinants of Health   Financial Resource Strain:   . Difficulty of Paying Living Expenses:   Food Insecurity:   . Worried About Charity fundraiser in the Last Year:   . Arboriculturist in the Last Year:   Transportation Needs:   . Lexicographer (Medical):   Marland Kitchen Lack of Transportation (Non-Medical):   Physical Activity:   . Days of Exercise per Week:   . Minutes of Exercise per Session:   Stress:   . Feeling of Stress :   Social Connections:   . Frequency of Communication with Friends and Family:   . Frequency of Social Gatherings with Friends and Family:   . Attends Religious Services:   . Active Member of Clubs or Organizations:   . Attends Archivist Meetings:   Marland Kitchen Marital Status:   Intimate Partner Violence:   . Fear of Current or Ex-Partner:   . Emotionally Abused:   Marland Kitchen Physically Abused:   . Sexually Abused:     FAMILY HISTORY: Family History  Problem Relation Age of Onset  . Osteoarthritis Mother   . Hypertension Mother   . Diabetes Mother   . Hypertension Father   . Gout Father   . Stroke Father 79       embolic  . Diabetes Half-Brother   . Hypertension Half-Brother   . Heart failure Half-Brother 45  . Kidney disease Half-Brother   . Diabetes Paternal Grandfather   . Cancer Paternal Grandfather 72       breast cancer    Review of Systems  Constitutional: Negative for appetite change, chills, fatigue, fever and unexpected weight change.  HENT:   Negative for hearing loss, lump/mass and sore throat.   Eyes: Negative for eye problems and icterus.  Respiratory: Negative for chest tightness, cough and shortness of breath.   Cardiovascular: Negative for chest pain, leg swelling and palpitations.  Gastrointestinal: Negative for abdominal distention, abdominal pain, constipation, diarrhea, nausea and vomiting.  Endocrine: Negative for hot flashes.  Genitourinary: Negative for difficulty urinating.   Musculoskeletal: Negative for arthralgias.  Skin: Negative for itching and rash.  Neurological: Negative for dizziness, extremity weakness, headaches and numbness.  Hematological: Negative for adenopathy. Does not bruise/bleed easily.  Psychiatric/Behavioral: Negative for depression. The  patient is not nervous/anxious.       PHYSICAL EXAMINATION  ECOG PERFORMANCE STATUS: 1 - Symptomatic but completely ambulatory  Vitals:   08/25/19 0935  BP: 139/74  Pulse: 85  Resp: 18  Temp: 98.9 F (37.2 C)  SpO2: 100%    Physical Exam Constitutional:      General: She is not in acute distress.  Appearance: Normal appearance. She is not toxic-appearing.  HENT:     Head: Normocephalic and atraumatic.  Eyes:     General: No scleral icterus.    Pupils: Pupils are equal, round, and reactive to light.  Cardiovascular:     Rate and Rhythm: Normal rate and regular rhythm.     Pulses: Normal pulses.     Heart sounds: Normal heart sounds.  Pulmonary:     Effort: Pulmonary effort is normal.     Breath sounds: Normal breath sounds.  Abdominal:     General: Abdomen is flat. Bowel sounds are normal.     Palpations: Abdomen is soft.  Musculoskeletal:        General: No swelling.     Cervical back: Neck supple.  Lymphadenopathy:     Cervical: No cervical adenopathy.  Skin:    General: Skin is warm and dry.     Capillary Refill: Capillary refill takes less than 2 seconds.     Findings: No erythema or rash.  Neurological:     General: No focal deficit present.     Mental Status: She is alert.  Psychiatric:        Mood and Affect: Mood normal.        Behavior: Behavior normal.     LABORATORY DATA:  CBC    Component Value Date/Time   WBC 4.8 08/25/2019 0851   WBC 6.2 10/25/2012 1400   RBC 3.10 (L) 08/25/2019 0851   HGB 10.1 (L) 08/25/2019 0851   HCT 31.0 (L) 08/25/2019 0851   PLT 287 08/25/2019 0851   MCV 100.0 08/25/2019 0851   MCV 93.1 07/11/2015 1908   MCH 32.6 08/25/2019 0851   MCHC 32.6 08/25/2019 0851   RDW 15.8 (H) 08/25/2019 0851   LYMPHSABS 1.8 08/25/2019 0851   MONOABS 0.5 08/25/2019 0851   EOSABS 0.0 08/25/2019 0851   BASOSABS 0.0 08/25/2019 0851    CMP     Component Value Date/Time   NA 144 08/25/2019 0851   NA 141 06/06/2019 1545   K 4.0  08/25/2019 0851   CL 110 08/25/2019 0851   CO2 22 08/25/2019 0851   GLUCOSE 146 (H) 08/25/2019 0851   BUN 11 08/25/2019 0851   BUN 7 06/06/2019 1545   CREATININE 0.86 08/25/2019 0851   CREATININE 0.79 07/11/2015 1905   CALCIUM 8.7 (L) 08/25/2019 0851   PROT 5.8 (L) 08/25/2019 0851   PROT 6.5 06/06/2019 1545   ALBUMIN 3.2 (L) 08/25/2019 0851   ALBUMIN 4.4 06/06/2019 1545   AST 15 08/25/2019 0851   ALT 19 08/25/2019 0851   ALKPHOS 54 08/25/2019 0851   BILITOT 0.2 (L) 08/25/2019 0851   GFRNONAA >60 08/25/2019 0851   GFRNONAA >89 07/11/2015 1905   GFRAA >60 08/25/2019 0851   GFRAA >89 07/11/2015 1905     ASSESSMENT and PLAN:   Malignant neoplasm of upper-outer quadrant of right breast in female, estrogen receptor positive ([place]) 06/21/2019:Patient palpated a painful right breast lump x3 weeks. Mammogram showed a 2.0cm right breast mass at the 10 o'clock position, a 3.6cm enlarged right axillary lymph node, and a 0.5cm left breast mass. Biopsy showed, in the right breast and axilla(lymph node), IDC, grade 3, HER-2 equivocal by IHC, + by FISH, ER+ 40%, PR - 0%, Ki67 80%,  left breast, fibrocystic changes, no malignancy.Can be followed.  Treatment plan: 1. Neoadjuvant chemotherapy with TCH Perjeta 6 cycles followed by HerceptinPerjeta versus Kadcylamaintenance for 1 year 2. Followed by breast conserving surgery if possible  with sentinel lymph node study 3. Followed by adjuvant radiation therapy if patient had lumpectomy 4.Consideration for neratinib Genetic counseling Breast MRI on 07/07/19 showed the 2.9cm biopsy-proven malignancy in the right breast and axilla, highly suspicious non-mass enhancement in the lateral right breast spanning 14.5cm, and no left breast malignancy ----------------------------------------------------------------------------------------------------------------------------------------------------------- Current treatment: Cycle 3 day 1 TCH Perjeta.   Chemotherapy started 07/12/2019 Echocardiogram 07/12/2019 EF 60-65%  Chemo toxicities: 1.  Diarrhea required IV fluids: Dose reduced with cycle 2 2.  Fatigue 3.  Nausea 4. Post Neulasta pain: sent in Percocet #15, and directed her to continue with heating pad, one tylenol and one aleve as  Much as TID PRN, and percocet for the week after her neulasta BID PRN.  She understands this.   5. Monitoring closely for peripheral neuropathy which she hasn't developed.    Return to clinic in 3 weeks for cycle 4 of treatment.  All questions were answered. The patient knows to call the clinic with any problems, questions or concerns. We can certainly see the patient much sooner if necessary.  The above reviewed with Dr. Jana Hakim in Dr. Geralyn Flash absence about continuing the pain medication in limited amount per guidelines.    Total encounter time: 30 minutes*  Wilber Bihari, NP 08/25/19 12:20 PM Medical Oncology and Hematology [facility] [place], [place] [address] Tel. [phone]    Fax. [phone]  *Total Encounter Time as defined by the Centers for Medicare and Medicaid Services includes, in addition to the face-to-face time of a patient visit (documented in the note above) non-face-to-face time: obtaining and reviewing outside history, ordering and reviewing medications, tests or procedures, care coordination (communications with other health care professionals or caregivers) and documentation in the medical record.

## 2019-08-25 NOTE — Patient Instructions (Signed)
[facility] Discharge Instructions for Patients Receiving Chemotherapy  Today you received the following chemotherapy agents: Trastuzumab, Perjeta. Taxotere, Carboplatin  To help prevent nausea and vomiting after your treatment, we encourage you to take your nausea medication as directed.   If you develop nausea and vomiting that is not controlled by your nausea medication, call the clinic.   BELOW ARE SYMPTOMS THAT SHOULD BE REPORTED IMMEDIATELY:  *FEVER GREATER THAN 100.5 F  *CHILLS WITH OR WITHOUT FEVER  NAUSEA AND VOMITING THAT IS NOT CONTROLLED WITH YOUR NAUSEA MEDICATION  *UNUSUAL SHORTNESS OF BREATH  *UNUSUAL BRUISING OR BLEEDING  TENDERNESS IN MOUTH AND THROAT WITH OR WITHOUT PRESENCE OF ULCERS  *URINARY PROBLEMS  *BOWEL PROBLEMS  UNUSUAL RASH Items with * indicate a potential emergency and should be followed up as soon as possible.  Feel free to call the clinic should you have any questions or concerns. The clinic phone number is [phone].  Please show the [facility] at check-in to the Emergency Department and triage nurse.

## 2019-08-29 ENCOUNTER — Other Ambulatory Visit: Payer: Self-pay

## 2019-08-29 ENCOUNTER — Telehealth: Payer: Self-pay | Admitting: Adult Health

## 2019-08-29 ENCOUNTER — Inpatient Hospital Stay: Payer: BC Managed Care – PPO

## 2019-08-29 ENCOUNTER — Inpatient Hospital Stay: Payer: BC Managed Care – PPO | Attending: Hematology and Oncology

## 2019-08-29 VITALS — BP 125/72 | HR 84 | Temp 98.0°F | Resp 16 | Ht 64.0 in | Wt 239.1 lb

## 2019-08-29 DIAGNOSIS — R197 Diarrhea, unspecified: Secondary | ICD-10-CM | POA: Diagnosis not present

## 2019-08-29 DIAGNOSIS — R5383 Other fatigue: Secondary | ICD-10-CM | POA: Insufficient documentation

## 2019-08-29 DIAGNOSIS — Z7689 Persons encountering health services in other specified circumstances: Secondary | ICD-10-CM | POA: Insufficient documentation

## 2019-08-29 DIAGNOSIS — E876 Hypokalemia: Secondary | ICD-10-CM | POA: Insufficient documentation

## 2019-08-29 DIAGNOSIS — Z170 Estrogen receptor positive status [ER+]: Secondary | ICD-10-CM | POA: Insufficient documentation

## 2019-08-29 DIAGNOSIS — C50411 Malignant neoplasm of upper-outer quadrant of right female breast: Secondary | ICD-10-CM | POA: Diagnosis present

## 2019-08-29 DIAGNOSIS — Z5111 Encounter for antineoplastic chemotherapy: Secondary | ICD-10-CM | POA: Insufficient documentation

## 2019-08-29 DIAGNOSIS — Z79899 Other long term (current) drug therapy: Secondary | ICD-10-CM | POA: Diagnosis not present

## 2019-08-29 DIAGNOSIS — Z95828 Presence of other vascular implants and grafts: Secondary | ICD-10-CM

## 2019-08-29 MED ORDER — SODIUM CHLORIDE 0.9 % IV SOLN
INTRAVENOUS | Status: AC
  Filled 2019-08-29 (×2): qty 250

## 2019-08-29 MED ORDER — SODIUM CHLORIDE 0.9% FLUSH
10.0000 mL | Freq: Once | INTRAVENOUS | Status: AC
  Administered 2019-08-29: 10 mL
  Filled 2019-08-29: qty 10

## 2019-08-29 MED ORDER — HEPARIN SOD (PORK) LOCK FLUSH 100 UNIT/ML IV SOLN
500.0000 [IU] | Freq: Once | INTRAVENOUS | Status: AC
  Administered 2019-08-29: 500 [IU]
  Filled 2019-08-29: qty 5

## 2019-08-29 MED ORDER — PEGFILGRASTIM-JMDB 6 MG/0.6ML ~~LOC~~ SOSY
PREFILLED_SYRINGE | SUBCUTANEOUS | Status: AC
  Filled 2019-08-29: qty 0.6

## 2019-08-29 MED ORDER — PEGFILGRASTIM-JMDB 6 MG/0.6ML ~~LOC~~ SOSY
6.0000 mg | PREFILLED_SYRINGE | Freq: Once | SUBCUTANEOUS | Status: AC
  Administered 2019-08-29: 6 mg via SUBCUTANEOUS

## 2019-08-29 NOTE — Telephone Encounter (Signed)
Scheduled appt per 5/28 los. Pt is aware of the appt change and will be coming in earlier. Pt will get injection with IVF.

## 2019-08-29 NOTE — Patient Instructions (Addendum)
Pegfilgrastim injection What is this medicine? PEGFILGRASTIM (PEG fil gra stim) is a long-acting granulocyte colony-stimulating factor that stimulates the growth of neutrophils, a type of white blood cell important in the body's fight against infection. It is used to reduce the incidence of fever and infection in patients with certain types of cancer who are receiving chemotherapy that affects the bone marrow, and to increase survival after being exposed to high doses of radiation. This medicine may be used for other purposes; ask your health care provider or pharmacist if you have questions. COMMON BRAND NAME(S): Fulphila, Neulasta, UDENYCA, Ziextenzo What should I tell my health care provider before I take this medicine? They need to know if you have any of these conditions:  kidney disease  latex allergy  ongoing radiation therapy  sickle cell disease  skin reactions to acrylic adhesives (On-Body Injector only)  an unusual or allergic reaction to pegfilgrastim, filgrastim, other medicines, foods, dyes, or preservatives  pregnant or trying to get pregnant  breast-feeding How should I use this medicine? This medicine is for injection under the skin. If you get this medicine at home, you will be taught how to prepare and give the pre-filled syringe or how to use the On-body Injector. Refer to the patient Instructions for Use for detailed instructions. Use exactly as directed. Tell your healthcare provider immediately if you suspect that the On-body Injector may not have performed as intended or if you suspect the use of the On-body Injector resulted in a missed or partial dose. It is important that you put your used needles and syringes in a special sharps container. Do not put them in a trash can. If you do not have a sharps container, call your pharmacist or healthcare provider to get one. Talk to your pediatrician regarding the use of this medicine in children. While this drug may be  prescribed for selected conditions, precautions do apply. Overdosage: If you think you have taken too much of this medicine contact a poison control center or emergency room at once. NOTE: This medicine is only for you. Do not share this medicine with others. What if I miss a dose? It is important not to miss your dose. Call your doctor or health care professional if you miss your dose. If you miss a dose due to an On-body Injector failure or leakage, a new dose should be administered as soon as possible using a single prefilled syringe for manual use. What may interact with this medicine? Interactions have not been studied. Give your health care provider a list of all the medicines, herbs, non-prescription drugs, or dietary supplements you use. Also tell them if you smoke, drink alcohol, or use illegal drugs. Some items may interact with your medicine. This list may not describe all possible interactions. Give your health care provider a list of all the medicines, herbs, non-prescription drugs, or dietary supplements you use. Also tell them if you smoke, drink alcohol, or use illegal drugs. Some items may interact with your medicine. What should I watch for while using this medicine? You may need blood work done while you are taking this medicine. If you are going to need a MRI, CT scan, or other procedure, tell your doctor that you are using this medicine (On-Body Injector only). What side effects may I notice from receiving this medicine? Side effects that you should report to your doctor or health care professional as soon as possible:  allergic reactions like skin rash, itching or hives, swelling of the   face, lips, or tongue  back pain  dizziness  fever  pain, redness, or irritation at site where injected  pinpoint red spots on the skin  red or dark-brown urine  shortness of breath or breathing problems  stomach or side pain, or pain at the  shoulder  swelling  tiredness  trouble passing urine or change in the amount of urine Side effects that usually do not require medical attention (report to your doctor or health care professional if they continue or are bothersome):  bone pain  muscle pain This list may not describe all possible side effects. Call your doctor for medical advice about side effects. You may report side effects to FDA at 1-800-FDA-1088. Where should I keep my medicine? Keep out of the reach of children. If you are using this medicine at home, you will be instructed on how to store it. Throw away any unused medicine after the expiration date on the label. NOTE: This sheet is a summary. It may not cover all possible information. If you have questions about this medicine, talk to your doctor, pharmacist, or health care provider.  2020 Elsevier/Gold Standard (2017-06-21 16:57:08)    Rehydration, Adult Rehydration is the replacement of body fluids and salts and minerals (electrolytes) that are lost during dehydration. Dehydration is when there is not enough fluid or water in the body. This happens when you lose more fluids than you take in. Common causes of dehydration include:  Vomiting.  Diarrhea.  Excessive sweating, such as from heat exposure or exercise.  Taking medicines that cause the body to lose excess fluid (diuretics).  Impaired kidney function.  Not drinking enough fluid.  Certain illnesses or infections.  Certain poorly controlled long-term (chronic) illnesses, such as diabetes, heart disease, and kidney disease.  Symptoms of mild dehydration may include thirst, dry lips and mouth, dry skin, and dizziness. Symptoms of severe dehydration may include increased heart rate, confusion, fainting, and not urinating. You can rehydrate by drinking certain fluids or getting fluids through an IV tube, as told by your health care provider. What are the risks? Generally, rehydration is safe.  However, one problem that can happen is taking in too much fluid (overhydration). This is rare. If overhydration happens, it can cause an electrolyte imbalance, kidney failure, or a decrease in salt (sodium) levels in the body. How to rehydrate Follow instructions from your health care provider for rehydration. The kind of fluid you should drink and the amount you should drink depend on your condition.  If directed by your health care provider, drink an oral rehydration solution (ORS). This is a drink designed to treat dehydration that is found in pharmacies and retail stores. ? Make an ORS by following instructions on the package. ? Start by drinking small amounts, about  cup (120 mL) every 5-10 minutes. ? Slowly increase how much you drink until you have taken the amount recommended by your health care provider.  Drink enough clear fluids to keep your urine clear or pale yellow. If you were instructed to drink an ORS, finish the ORS first, then start slowly drinking other clear fluids. Drink fluids such as: ? Water. Do not drink only water. Doing that can lead to having too little sodium in your body (hyponatremia). ? Ice chips. ? Fruit juice that you have added water to (diluted juice). ? Low-calorie sports drinks.  If you are severely dehydrated, your health care provider may recommend that you receive fluids through an IV tube in the hospital.    Do not take sodium tablets. Doing that can lead to the condition of having too much sodium in your body (hypernatremia). Eating while you rehydrate Follow instructions from your health care provider about what to eat while you rehydrate. Your health care provider may recommend that you slowly begin eating regular foods in small amounts.  Eat foods that contain a healthy balance of electrolytes, such as bananas, oranges, potatoes, tomatoes, and spinach.  Avoid foods that are greasy or contain a lot of fat or sugar.  In some cases, you may get  nutrition through a feeding tube that is passed through your nose and into your stomach (nasogastric tube, or NG tube). This may be done if you have uncontrolled vomiting or diarrhea. Beverages to avoid Certain beverages may make dehydration worse. While you rehydrate, avoid:  Alcohol.  Caffeine.  Drinks that contain a lot of sugar. These include: ? High-calorie sports drinks. ? Fruit juice that is not diluted. ? Soda.  Check nutrition labels to see how much sugar or caffeine a beverage contains. Signs of dehydration recovery You may be recovering from dehydration if:  You are urinating more often than before you started rehydrating.  Your urine is clear or pale yellow.  Your energy level improves.  You vomit less frequently.  You have diarrhea less frequently.  Your appetite improves or returns to normal.  You feel less dizzy or less light-headed.  Your skin tone and color start to look more normal. Contact a health care provider if:  You continue to have symptoms of mild dehydration, such as: ? Thirst. ? Dry lips. ? Slightly dry mouth. ? Dry, warm skin. ? Dizziness.  You continue to vomit or have diarrhea. Get help right away if:  You have symptoms of dehydration that get worse.  You feel: ? Confused. ? Weak. ? Like you are going to faint.  You have not urinated in 6-8 hours.  You have very dark urine.  You have trouble breathing.  Your heart rate while sitting still is over 100 beats a minute.  You cannot drink fluids without vomiting.  You have vomiting or diarrhea that: ? Gets worse. ? Does not go away.  You have a fever. This information is not intended to replace advice given to you by your health care provider. Make sure you discuss any questions you have with your health care provider. Document Revised: 02/26/2017 Document Reviewed: 05/10/2015 Elsevier Patient Education  2020 Elsevier Inc.  

## 2019-08-30 ENCOUNTER — Telehealth: Payer: Self-pay | Admitting: Hematology and Oncology

## 2019-08-30 NOTE — Telephone Encounter (Signed)
Unable to reach pt- left voicemail. Rescheduled appt on 6/17 to see Lucianne Lei. Dr. Lindi Adie on [facility].

## 2019-09-01 ENCOUNTER — Other Ambulatory Visit (HOSPITAL_COMMUNITY)
Admission: RE | Admit: 2019-09-01 | Discharge: 2019-09-01 | Disposition: A | Discharge status: Home / Self Care | Payer: BC Managed Care – PPO | Source: Ambulatory Visit | Attending: Family Medicine | Admitting: Family Medicine

## 2019-09-01 ENCOUNTER — Encounter: Payer: Self-pay | Admitting: Family Medicine

## 2019-09-01 ENCOUNTER — Ambulatory Visit (INDEPENDENT_AMBULATORY_CARE_PROVIDER_SITE_OTHER): Payer: BC Managed Care – PPO | Admitting: Family Medicine

## 2019-09-01 ENCOUNTER — Other Ambulatory Visit: Payer: Self-pay

## 2019-09-01 VITALS — BP 128/84 | HR 100 | Temp 98.8°F | Ht 64.0 in | Wt 232.0 lb

## 2019-09-01 DIAGNOSIS — F321 Major depressive disorder, single episode, moderate: Secondary | ICD-10-CM | POA: Diagnosis not present

## 2019-09-01 DIAGNOSIS — Z01419 Encounter for gynecological examination (general) (routine) without abnormal findings: Secondary | ICD-10-CM | POA: Diagnosis not present

## 2019-09-01 DIAGNOSIS — Z170 Estrogen receptor positive status [ER+]: Secondary | ICD-10-CM

## 2019-09-01 DIAGNOSIS — I10 Essential (primary) hypertension: Secondary | ICD-10-CM | POA: Diagnosis not present

## 2019-09-01 DIAGNOSIS — C50411 Malignant neoplasm of upper-outer quadrant of right female breast: Secondary | ICD-10-CM

## 2019-09-01 DIAGNOSIS — Z01411 Encounter for gynecological examination (general) (routine) with abnormal findings: Secondary | ICD-10-CM

## 2019-09-01 DIAGNOSIS — J4530 Mild persistent asthma, uncomplicated: Secondary | ICD-10-CM

## 2019-09-01 MED ORDER — LISINOPRIL 20 MG PO TABS: 20.0000 mg | ORAL_TABLET | Freq: Every day | ORAL | 1 refills | Status: DC

## 2019-09-01 MED ORDER — AMLODIPINE BESYLATE 5 MG PO TABS
5.0000 mg | ORAL_TABLET | Freq: Every day | ORAL | 1 refills | Status: DC
Start: 2019-09-01 — End: 2020-09-03

## 2019-09-01 MED ORDER — ALBUTEROL SULFATE HFA 108 (90 BASE) MCG/ACT IN AERS: 2.0000 | INHALATION_SPRAY | Freq: Four times a day (QID) | RESPIRATORY_TRACT | 2 refills | Status: AC | PRN

## 2019-09-01 MED ORDER — FLUTICASONE PROPIONATE HFA 44 MCG/ACT IN AERO: 1.0000 | INHALATION_SPRAY | Freq: Two times a day (BID) | RESPIRATORY_TRACT | 5 refills | Status: DC | PRN

## 2019-09-01 MED ORDER — BUPROPION HCL ER (XL) 300 MG PO TB24: 300.0000 mg | ORAL_TABLET | Freq: Every day | ORAL | 1 refills | Status: DC

## 2019-09-01 NOTE — Progress Notes (Signed)
6/4/20213:15 PM  Heidi Costa Oct 31, 1980, 39 y.o., female [id number]  Chief Complaint  Patient presents with  . Gynecologic Exam    pt came in tachy, says she went to chemo on Fri and received injection on Tues. Feels really fatigue    HPI:   Patient is a 39 y.o. female with past medical history significant for Right breast cancer, HTN depression, mild persistent asthma who presents today for CPE with pap  Cervical Cancer Screening: last pap 2018, normal, denies any history of abnormal paps Breast Cancer Screening: undergoing chemo for R breast cancer Colorectal Cancer Screening: at age 76 Bone Density Testing: per oncology HIV Screening: 2018 STI Screening: 2018,, requesting testing today Seasonal Influenza Vaccination: declines Td/Tdap Vaccination: 2017 Pneumococcal Vaccination: 2017 Zoster Vaccination: at age 39 covid vaccination: declines Frequency of Dental evaluation: needs to make appt Frequency of Eye evaluation: recently saw  Reports depression not well controlled Having crying spells, sadness, anhedonia on most days of the week, denies SI  Needs refills of asthma meds, rarely uses albuterol when she has her flovent, ran out several weeks ago  Struggling with dehydration 2/2 chemo Has had to receive IVF several times  Depression screen [facility] 2/9 07/04/2019 06/23/2019 06/06/2019  Decreased Interest 0 1 1  Down, Depressed, Hopeless 0 1 0  PHQ - 2 Score 0 2 1  Altered sleeping 0 1 -  Tired, decreased energy 1 1 -  Change in appetite 0 1 -  Feeling bad or failure about yourself  0 1 -  Trouble concentrating 0 1 -  Moving slowly or fidgety/restless 0 0 -  Suicidal thoughts 0 1 -  PHQ-9 Score 1 8 -  Difficult doing work/chores Somewhat difficult Very difficult -    Fall Risk  09/01/2019 07/04/2019 06/23/2019 06/06/2019 10/19/2016  Falls in the past year? 0 0 0 0 No  Number falls in past yr: 0 0 0 0 -  Injury with Fall? 0 0 0 0 -  Follow up - Falls evaluation  completed Falls evaluation completed Falls evaluation completed -     Allergies  Allergen Reactions  . Aspirin Rash    Prior to Admission medications   Medication Sig Start Date End Date Taking? Authorizing Provider  acetaminophen (TYLENOL) 500 MG tablet Take 500-1,000 mg by mouth daily as needed for pain.     [provider]  albuterol (PROVENTIL) (2.5 MG/3ML) 0.083% nebulizer solution Take 2.5 mg by nebulization every 6 (six) hours as needed for wheezing or shortness of breath.    [provider]  buPROPion (WELLBUTRIN XL) 150 MG 24 hr tablet 1 tab qam x 7 days then increase to 2 pills Patient taking differently: Take 150 mg by mouth daily.  06/23/19   Wendall Mola, NP  ibuprofen (ADVIL) 200 MG tablet Take 200-400 mg by mouth every 6 (six) hours as needed for mild pain.    [provider]  lidocaine-prilocaine (EMLA) cream Apply to affected area once 07/05/19   Nicholas Lose, MD  lisinopril-hydrochlorothiazide (ZESTORETIC) 20-25 MG tablet Take 1 tablet by mouth daily. 06/06/19 07/07/19  Wendall Mola, NP  LORazepam (ATIVAN) 0.5 MG tablet Take 1 tablet (0.5 mg total) by mouth at bedtime as needed for sleep. 08/08/19   Nicholas Lose, MD  ondansetron (ZOFRAN ODT) 8 MG disintegrating tablet Take 1 tablet (8 mg total) by mouth every 8 (eight) hours as needed for nausea or vomiting. 08/11/19   Tanner, Lyndon Code., PA-C  ondansetron [facility]) 8  MG tablet Take 1 tablet (8 mg total) by mouth 2 (two) times daily as needed (Nausea or vomiting). Begin 4 days after chemotherapy. 07/05/19   Nicholas Lose, MD  oxyCODONE-acetaminophen (PERCOCET/ROXICET) 5-325 MG tablet Take 1 tablet by mouth every 8 (eight) hours as needed for severe pain. 08/25/19   Gardenia Phlegm, NP  prochlorperazine (COMPAZINE) 10 MG tablet Take 1 tablet (10 mg total) by mouth every 6 (six) hours as needed (Nausea or vomiting). 07/05/19   Nicholas Lose, MD    Past Medical History:  Diagnosis Date  .  Abnormality of gait 08/02/2012   resolved per patient on 07/12/19  . Anemia    iron taking in the past, not currently  . Anxiety   . Arthritis    spine- cerv.  & arm   . Asthma   . Bulging lumbar disc   . Cancer [facility])    Right Breast-starts chemo on 07/14/19  . Chronic arthralgias of knees and hips 08/02/2012  . Depression    Pt. took self off Celexa one month ago, states she did n't like how it made her feel .  Marland Kitchen Family history of cancer   . GERD (gastroesophageal reflux disease)    diet controlled  . Heart murmur    pt. been told that this is a fact, no echo or cardiac surveillance in the past   . HLD (hyperlipidemia)    no meds, diet controlled  . Hypertension   . Obesity   . Spinal stenosis     Past Surgical History:  Procedure Laterality Date  . ANTERIOR CERVICAL DECOMP/DISCECTOMY FUSION N/A 10/27/2012   Procedure: ANTERIOR CERVICAL DECOMPRESSION/DISCECTOMY FUSION 1 LEVEL;  Surgeon: Faythe Ghee, MD;  Location: [place] [facility];  Service: Neurosurgery;  Laterality: N/A;  ANTERIOR CERVICAL DECOMPRESSION/DISCECTOMY FUSION 1 LEVEL  . CHALAZION EXCISION     L eye   . PORTACATH PLACEMENT Left 07/13/2019   Procedure: INSERTION PORT-A-CATH WITH ULTRASOUND;  Surgeon: Rolm Bookbinder, MD;  Location: [place];  Service: General;  Laterality: Left;  Marland Kitchen VAGINAL DELIVERY  2002  . WISDOM TOOTH EXTRACTION      Social History   Tobacco Use  . Smoking status: Former Smoker    Packs/day: 1.00    Years: 10.00    Pack years: 10.00    Types: Cigarettes    Quit date: 06/25/2019    Years since quitting: 0.1  . Smokeless tobacco: Never Used  Substance Use Topics  . Alcohol use: Yes    Comment: occas    Family History  Problem Relation Age of Onset  . Osteoarthritis Mother   . Hypertension Mother   . Diabetes Mother   . Hypertension Father   . Gout Father   . Stroke Father 78       embolic  . Diabetes Half-Brother   . Hypertension Half-Brother   . Heart failure Half-Brother 45  .  Kidney disease Half-Brother   . Diabetes Paternal Grandfather   . Cancer Paternal Grandfather 20       breast cancer    Review of Systems  Constitutional: Negative for chills and fever.  Respiratory: Negative for cough and shortness of breath.   Cardiovascular: Negative for chest pain, palpitations and leg swelling.  Gastrointestinal: Positive for nausea and vomiting. Negative for abdominal pain and diarrhea.  Genitourinary: Negative for frequency and urgency.  Neurological: Negative for dizziness and headaches.  All other systems reviewed and are negative.    OBJECTIVE:  Today's Vitals  09/01/19 1458  BP: 128/84  Pulse: 100  Temp: 98.8 F (37.1 C)  SpO2: 100%  Weight: 232 lb (105.2 kg)  Height: 5\' 4"  (1.626 m)   Body mass index is 39.82 kg/m.   Physical Exam Vitals and nursing note reviewed. Exam conducted with a chaperone present.  Constitutional:      Appearance: She is well-developed.  HENT:     Head: Normocephalic and atraumatic.     Right Ear: Hearing, tympanic membrane, ear canal and external ear normal.     Left Ear: Hearing, tympanic membrane, ear canal and external ear normal.     Mouth/Throat:     Mouth: Mucous membranes are moist.     Pharynx: No oropharyngeal exudate or posterior oropharyngeal erythema.  Eyes:     Extraocular Movements: Extraocular movements intact.     Conjunctiva/sclera: Conjunctivae normal.     Pupils: Pupils are equal, round, and reactive to light.  Neck:     Thyroid: No thyromegaly.  Cardiovascular:     Rate and Rhythm: Normal rate and regular rhythm.     Heart sounds: Normal heart sounds. No murmur. No friction rub. No gallop.   Pulmonary:     Effort: Pulmonary effort is normal.     Breath sounds: Normal breath sounds. No wheezing, rhonchi or rales.  Abdominal:     General: Bowel sounds are normal. There is no distension.     Palpations: Abdomen is soft. There is no hepatomegaly, splenomegaly or mass.     Tenderness:  There is no abdominal tenderness.  Genitourinary:    General: Normal vulva.     Labia:        Right: No rash or lesion.        Left: No rash or lesion.      Vagina: Normal. No vaginal discharge, erythema or lesions.     Cervix: No cervical motion tenderness, discharge, friability or lesion.     Uterus: Normal. Not fixed and not tender.      Adnexa: Right adnexa normal and left adnexa normal.       Right: No tenderness or fullness.         Left: No tenderness or fullness.    Musculoskeletal:        General: Normal range of motion.     Cervical back: Neck supple.     Right lower leg: No edema.     Left lower leg: No edema.  Lymphadenopathy:     Cervical: No cervical adenopathy.  Skin:    General: Skin is warm and dry.  Neurological:     Mental Status: She is alert and oriented to person, place, and time.     Cranial Nerves: No cranial nerve deficit.     Gait: Gait normal.     Deep Tendon Reflexes: Reflexes are normal and symmetric.  Psychiatric:        Mood and Affect: Mood normal.        Behavior: Behavior normal.    Lab Results  Component Value Date   CREATININE 0.86 08/25/2019   BUN 11 08/25/2019   NA 144 08/25/2019   K 4.0 08/25/2019   CL 110 08/25/2019   CO2 22 08/25/2019    No results found for this or any previous visit (from the past 24 hour(s)).  No results found.   ASSESSMENT and PLAN  1. Women's annual routine gynecological examination HCM reviewed/discussed. Labs will be done once completed chemo as requested by patient. Anticipatory guidance regarding healthy  weight, lifestyle and choices given.  - Cytology - PAP  2. Malignant neoplasm of upper-outer quadrant of right breast in female, estrogen receptor positive ([place]) Undergoing chemo, under med onc care  3. Essential hypertension Controlled. Given struggles with dehydration, stop hctz, start amlodipine. Continue lisinopril  4. Depression, major, single episode, moderate ([place]) Not controlled.  Increase wellbutrin  5. Mild persistent asthma without complication Controlled. Cont current regime.  Other orders - buPROPion (WELLBUTRIN XL) 300 MG 24 hr tablet; Take 1 tablet (300 mg total) by mouth daily. 1 tab qam x 7 days then increase to 2 pills - fluticasone (FLOVENT HFA) 44 MCG/ACT inhaler; Inhale 1 puff into the lungs 2 (two) times daily as needed (asthma). - albuterol (VENTOLIN HFA) 108 (90 Base) MCG/ACT inhaler; Inhale 2 puffs into the lungs every 6 (six) hours as needed for wheezing or shortness of breath. - lisinopril (ZESTRIL) 20 MG tablet; Take 1 tablet (20 mg total) by mouth daily. - amLODipine (NORVASC) 5 MG tablet; Take 1 tablet (5 mg total) by mouth daily.  Return in about 4 weeks (around 09/29/2019) for HTN and depression.    Rutherford Guys, MD [facility] [place], [place] [address] Ph.  [phone] Fax [phone]

## 2019-09-01 NOTE — Patient Instructions (Addendum)
   If you have lab work done today you will be contacted with your lab results within the next 2 weeks.  If you have not heard from us then please contact us. The fastest way to get your results is to register for My Chart.   IF you received an x-ray today, you will receive an invoice from [facility]. Please contact [facility] at [phone] with questions or concerns regarding your invoice.   IF you received labwork today, you will receive an invoice from [facility]. Please contact [facility] at [phone] with questions or concerns regarding your invoice.   Our billing staff will not be able to assist you with questions regarding bills from these companies.  You will be contacted with the lab results as soon as they are available. The fastest way to get your results is to activate your My Chart account. Instructions are located on the last page of this paperwork. If you have not heard from us regarding the results in 2 weeks, please contact this office.     Preventive Care 21-39 Years Old, Female Preventive care refers to visits with your health care provider and lifestyle choices that can promote health and wellness. This includes:  A yearly physical exam. This may also be called an annual well check.  Regular dental visits and eye exams.  Immunizations.  Screening for certain conditions.  Healthy lifestyle choices, such as eating a healthy diet, getting regular exercise, not using drugs or products that contain nicotine and tobacco, and limiting alcohol use. What can I expect for my preventive care visit? Physical exam Your health care provider will check your:  Height and weight. This may be used to calculate body mass index (BMI), which tells if you are at a healthy weight.  Heart rate and blood pressure.  Skin for abnormal spots. Counseling Your health care provider may ask you questions about your:  Alcohol, tobacco, and drug use.  Emotional  well-being.  Home and relationship well-being.  Sexual activity.  Eating habits.  Work and work environment.  Method of birth control.  Menstrual cycle.  Pregnancy history. What immunizations do I need?  Influenza (flu) vaccine  This is recommended every year. Tetanus, diphtheria, and pertussis (Tdap) vaccine  You may need a Td booster every 10 years. Varicella (chickenpox) vaccine  You may need this if you have not been vaccinated. Human papillomavirus (HPV) vaccine  If recommended by your health care provider, you may need three doses over 6 months. Measles, mumps, and rubella (MMR) vaccine  You may need at least one dose of MMR. You may also need a second dose. Meningococcal conjugate (MenACWY) vaccine  One dose is recommended if you are age 19-21 years and a first-year college student living in a residence hall, or if you have one of several medical conditions. You may also need additional booster doses. Pneumococcal conjugate (PCV13) vaccine  You may need this if you have certain conditions and were not previously vaccinated. Pneumococcal polysaccharide (PPSV23) vaccine  You may need one or two doses if you smoke cigarettes or if you have certain conditions. Hepatitis A vaccine  You may need this if you have certain conditions or if you travel or work in places where you may be exposed to hepatitis A. Hepatitis B vaccine  You may need this if you have certain conditions or if you travel or work in places where you may be exposed to hepatitis B. Haemophilus influenzae type b (Hib) vaccine  You   may need this if you have certain conditions. You may receive vaccines as individual doses or as more than one vaccine together in one shot (combination vaccines). Talk with your health care provider about the risks and benefits of combination vaccines. What tests do I need?  Blood tests  Lipid and cholesterol levels. These may be checked every 5 years starting at age  20.  Hepatitis C test.  Hepatitis B test. Screening  Diabetes screening. This is done by checking your blood sugar (glucose) after you have not eaten for a while (fasting).  Sexually transmitted disease (STD) testing.  BRCA-related cancer screening. This may be done if you have a family history of breast, ovarian, tubal, or peritoneal cancers.  Pelvic exam and Pap test. This may be done every 3 years starting at age 21. Starting at age 30, this may be done every 5 years if you have a Pap test in combination with an HPV test. Talk with your health care provider about your test results, treatment options, and if necessary, the need for more tests. Follow these instructions at home: Eating and drinking   Eat a diet that includes fresh fruits and vegetables, whole grains, lean protein, and low-fat dairy.  Take vitamin and mineral supplements as recommended by your health care provider.  Do not drink alcohol if: ? Your health care provider tells you not to drink. ? You are pregnant, may be pregnant, or are planning to become pregnant.  If you drink alcohol: ? Limit how much you have to 0-1 drink a day. ? Be aware of how much alcohol is in your drink. In the [place] one drink equals one 12 oz bottle of beer (355 mL), one 5 oz glass of wine (148 mL), or one 1 oz glass of hard liquor (44 mL). Lifestyle  Take daily care of your teeth and gums.  Stay active. Exercise for at least 30 minutes on 5 or more days each week.  Do not use any products that contain nicotine or tobacco, such as cigarettes, e-cigarettes, and chewing tobacco. If you need help quitting, ask your health care provider.  If you are sexually active, practice safe sex. Use a condom or other form of birth control (contraception) in order to prevent pregnancy and STIs (sexually transmitted infections). If you plan to become pregnant, see your health care provider for a preconception visit. What's next?  Visit your health  care provider once a year for a well check visit.  Ask your health care provider how often you should have your eyes and teeth checked.  Stay up to date on all vaccines. This information is not intended to replace advice given to you by your health care provider. Make sure you discuss any questions you have with your health care provider. Document Revised: 11/25/2017 Document Reviewed: 11/25/2017 Elsevier Patient Education  2020 [employer]vier Inc.  

## 2019-09-05 ENCOUNTER — Other Ambulatory Visit: Payer: Self-pay

## 2019-09-07 LAB — CYTOLOGY - PAP
Chlamydia: NEGATIVE
Comment: NEGATIVE
Comment: NEGATIVE
Comment: NEGATIVE
Comment: NORMAL
Diagnosis: UNDETERMINED — AB
High risk HPV: NEGATIVE
Neisseria Gonorrhea: NEGATIVE
Trichomonas: NEGATIVE

## 2019-09-14 ENCOUNTER — Ambulatory Visit: Payer: BC Managed Care – PPO | Admitting: Hematology and Oncology

## 2019-09-14 ENCOUNTER — Encounter: Payer: Self-pay | Admitting: *Deleted

## 2019-09-14 ENCOUNTER — Inpatient Hospital Stay: Payer: BC Managed Care – PPO

## 2019-09-14 ENCOUNTER — Other Ambulatory Visit: Payer: Self-pay

## 2019-09-14 ENCOUNTER — Inpatient Hospital Stay (HOSPITAL_BASED_OUTPATIENT_CLINIC_OR_DEPARTMENT_OTHER): Payer: BC Managed Care – PPO | Admitting: Medical

## 2019-09-14 VITALS — BP 106/54 | HR 91 | Temp 97.5°F | Resp 17 | Ht 64.0 in | Wt 244.0 lb

## 2019-09-14 DIAGNOSIS — G8929 Other chronic pain: Secondary | ICD-10-CM | POA: Diagnosis not present

## 2019-09-14 DIAGNOSIS — M25562 Pain in left knee: Secondary | ICD-10-CM

## 2019-09-14 DIAGNOSIS — M25561 Pain in right knee: Secondary | ICD-10-CM

## 2019-09-14 DIAGNOSIS — M25551 Pain in right hip: Secondary | ICD-10-CM

## 2019-09-14 DIAGNOSIS — Z170 Estrogen receptor positive status [ER+]: Secondary | ICD-10-CM

## 2019-09-14 DIAGNOSIS — Z95828 Presence of other vascular implants and grafts: Secondary | ICD-10-CM

## 2019-09-14 DIAGNOSIS — M25552 Pain in left hip: Secondary | ICD-10-CM

## 2019-09-14 DIAGNOSIS — C50411 Malignant neoplasm of upper-outer quadrant of right female breast: Secondary | ICD-10-CM | POA: Diagnosis not present

## 2019-09-14 LAB — CMP (CANCER CENTER ONLY)
ALT: 18 U/L (ref 0–44)
AST: 16 U/L (ref 15–41)
Albumin: 3.1 g/dL — ABNORMAL LOW (ref 3.5–5.0)
Alkaline Phosphatase: 50 U/L (ref 38–126)
Anion gap: 9 (ref 5–15)
BUN: 11 mg/dL (ref 6–20)
CO2: 27 mmol/L (ref 22–32)
Calcium: 8.7 mg/dL — ABNORMAL LOW (ref 8.9–10.3)
Chloride: 106 mmol/L (ref 98–111)
Creatinine: 0.84 mg/dL (ref 0.44–1.00)
GFR, Est AFR Am: >60 mL/min (ref >60)
GFR, Estimated: >60 mL/min (ref >60)
Glucose, Bld: 101 mg/dL — ABNORMAL HIGH (ref 70–99)
Potassium: 3.8 mmol/L (ref 3.5–5.1)
Sodium: 142 mmol/L (ref 135–145)
Total Bilirubin: 0.4 mg/dL (ref 0.3–1.2)
Total Protein: 5.6 g/dL — ABNORMAL LOW (ref 6.5–8.1)

## 2019-09-14 LAB — CBC WITH DIFFERENTIAL (CANCER CENTER ONLY)
Abs Immature Granulocytes: 0.02 10*3/uL (ref 0.00–0.07)
Basophils Absolute: 0 10*3/uL (ref 0.0–0.1)
Basophils Relative: 1 %
Eosinophils Absolute: 0 10*3/uL (ref 0.0–0.5)
Eosinophils Relative: 0 %
HCT: 27.2 % — ABNORMAL LOW (ref 36.0–46.0)
Hemoglobin: 9 g/dL — ABNORMAL LOW (ref 12.0–15.0)
Immature Granulocytes: 0 %
Lymphocytes Relative: 33 %
Lymphs Abs: 2 10*3/uL (ref 0.7–4.0)
MCH: 33.3 pg (ref 26.0–34.0)
MCHC: 33.1 g/dL (ref 30.0–36.0)
MCV: 100.7 fL — ABNORMAL HIGH (ref 80.0–100.0)
Monocytes Absolute: 0.7 10*3/uL (ref 0.1–1.0)
Monocytes Relative: 11 %
Neutro Abs: 3.4 10*3/uL (ref 1.7–7.7)
Neutrophils Relative %: 55 %
Platelet Count: 164 10*3/uL (ref 150–400)
RBC: 2.7 MIL/uL — ABNORMAL LOW (ref 3.87–5.11)
RDW: 17.2 % — ABNORMAL HIGH (ref 11.5–15.5)
WBC Count: 6.2 10*3/uL (ref 4.0–10.5)
nRBC: 0.3 % — ABNORMAL HIGH (ref 0.0–0.2)

## 2019-09-14 LAB — PREGNANCY, URINE: Preg Test, Ur: NEGATIVE

## 2019-09-14 MED ORDER — TRASTUZUMAB-DKST CHEMO 150 MG IV SOLR
6.0000 mg/kg | Freq: Once | INTRAVENOUS | Status: AC
  Administered 2019-09-14: 672 mg via INTRAVENOUS
  Filled 2019-09-14: qty 32

## 2019-09-14 MED ORDER — SODIUM CHLORIDE 0.9 % IV SOLN
600.0000 mg | Freq: Once | INTRAVENOUS | Status: AC
  Administered 2019-09-14: 600 mg via INTRAVENOUS
  Filled 2019-09-14: qty 60

## 2019-09-14 MED ORDER — ACETAMINOPHEN 325 MG PO TABS
ORAL_TABLET | ORAL | Status: AC
  Filled 2019-09-14: qty 2

## 2019-09-14 MED ORDER — SODIUM CHLORIDE 0.9% FLUSH
10.0000 mL | Freq: Once | INTRAVENOUS | Status: AC
  Administered 2019-09-14: 10 mL
  Filled 2019-09-14: qty 10

## 2019-09-14 MED ORDER — PALONOSETRON HCL INJECTION 0.25 MG/5ML
INTRAVENOUS | Status: AC
  Filled 2019-09-14: qty 5

## 2019-09-14 MED ORDER — SODIUM CHLORIDE 0.9% FLUSH
10.0000 mL | INTRAVENOUS | Status: DC | PRN
  Administered 2019-09-14: 10 mL
  Filled 2019-09-14: qty 10

## 2019-09-14 MED ORDER — HEPARIN SOD (PORK) LOCK FLUSH 100 UNIT/ML IV SOLN
500.0000 [IU] | Freq: Once | INTRAVENOUS | Status: AC | PRN
  Administered 2019-09-14: 500 [IU]
  Filled 2019-09-14: qty 5

## 2019-09-14 MED ORDER — SODIUM CHLORIDE 0.9 % IV SOLN
Freq: Once | INTRAVENOUS | Status: AC
  Filled 2019-09-14: qty 250

## 2019-09-14 MED ORDER — OXYCODONE-ACETAMINOPHEN 5-325 MG PO TABS: 1.0000 | ORAL_TABLET | Freq: Three times a day (TID) | ORAL | 0 refills | Status: DC | PRN

## 2019-09-14 MED ORDER — SODIUM CHLORIDE 0.9 % IV SOLN
420.0000 mg | Freq: Once | INTRAVENOUS | Status: AC
  Administered 2019-09-14: 420 mg via INTRAVENOUS
  Filled 2019-09-14: qty 14

## 2019-09-14 MED ORDER — SODIUM CHLORIDE 0.9 % IV SOLN
65.0000 mg/m2 | Freq: Once | INTRAVENOUS | Status: AC
  Administered 2019-09-14: 150 mg via INTRAVENOUS
  Filled 2019-09-14: qty 15

## 2019-09-14 MED ORDER — SODIUM CHLORIDE 0.9 % IV SOLN
150.0000 mg | Freq: Once | INTRAVENOUS | Status: AC
  Administered 2019-09-14: 150 mg via INTRAVENOUS
  Filled 2019-09-14: qty 150

## 2019-09-14 MED ORDER — PALONOSETRON HCL INJECTION 0.25 MG/5ML
0.2500 mg | Freq: Once | INTRAVENOUS | Status: AC
  Administered 2019-09-14: 0.25 mg via INTRAVENOUS

## 2019-09-14 MED ORDER — DIPHENHYDRAMINE HCL 25 MG PO CAPS
ORAL_CAPSULE | ORAL | Status: AC
  Filled 2019-09-14: qty 1

## 2019-09-14 MED ORDER — SODIUM CHLORIDE 0.9 % IV SOLN
10.0000 mg | Freq: Once | INTRAVENOUS | Status: AC
  Administered 2019-09-14: 10 mg via INTRAVENOUS
  Filled 2019-09-14: qty 10

## 2019-09-14 MED ORDER — ACETAMINOPHEN 325 MG PO TABS
650.0000 mg | ORAL_TABLET | Freq: Once | ORAL | Status: AC
  Administered 2019-09-14: 650 mg via ORAL

## 2019-09-14 MED ORDER — DIPHENHYDRAMINE HCL 25 MG PO CAPS
25.0000 mg | ORAL_CAPSULE | Freq: Once | ORAL | Status: AC
  Administered 2019-09-14: 25 mg via ORAL

## 2019-09-14 NOTE — Patient Instructions (Signed)
Echo Discharge Instructions for Patients Receiving Chemotherapy  Today you received the following chemotherapy agents: Trastuzumab-dkst (Ogivri), Pertuzumab (Perjeta), Docetaxel (Taxotere), and Carboplatin (Paraplatin)  To help prevent nausea and vomiting after your treatment, we encourage you to take your nausea medication as directed by your provider.   If you develop nausea and vomiting that is not controlled by your nausea medication, call the clinic.   BELOW ARE SYMPTOMS THAT SHOULD BE REPORTED IMMEDIATELY:  *FEVER GREATER THAN 100.5 F  *CHILLS WITH OR WITHOUT FEVER  NAUSEA AND VOMITING THAT IS NOT CONTROLLED WITH YOUR NAUSEA MEDICATION  *UNUSUAL SHORTNESS OF BREATH  *UNUSUAL BRUISING OR BLEEDING  TENDERNESS IN MOUTH AND THROAT WITH OR WITHOUT PRESENCE OF ULCERS  *URINARY PROBLEMS  *BOWEL PROBLEMS  UNUSUAL RASH Items with * indicate a potential emergency and should be followed up as soon as possible.  Feel free to call the clinic should you have any questions or concerns. The clinic phone number is [phone].  Please show the [facility] at check-in to the Emergency Department and triage nurse.

## 2019-09-14 NOTE — Progress Notes (Signed)
Patient Care Team: Rutherford Guys, MD as PCP - General (Family Medicine) Rockwell Germany, RN as Oncology Nurse Navigator Mauro Kaufmann, RN as Oncology Nurse Navigator Rolm Bookbinder, MD as Consulting Physician (General Surgery) Nicholas Lose, MD as Consulting Physician (Hematology and Oncology) Kyung Rudd, MD as Consulting Physician (Radiation Oncology)  DIAGNOSIS:    ICD-10-CM   1. Chronic arthralgias of knees and hips  M25.551 oxyCODONE-acetaminophen (PERCOCET/ROXICET) 5-325 MG tablet   G89.29    M25.561    M25.552    M25.562   2. Malignant neoplasm of upper-outer quadrant of right breast in female, estrogen receptor positive (Heidi Costa)  C50.411    Z17.0     SUMMARY OF ONCOLOGIC HISTORY: Oncology History  Malignant neoplasm of upper-outer quadrant of right breast in female, estrogen receptor positive (Heidi Costa)  06/21/2019 Initial Diagnosis   Patient palpated a painful right breast lump x3 weeks. Mammogram showed a 2.0cm right breast mass at the 10 o'clock position, a 3.6cm enlarged right axillary lymph node, and a 0.5cm left breast mass. Biopsy showed, in the right breast and axilla, IDC, grade 3, HER-2 equivocal by IHC, + by FISH, ER+ 40%, PR - 0%, Ki67 80%, and in the left breast, fibrocystic changes, no malignancy.    07/04/2019 Cancer Staging   Staging form: Breast, AJCC 8th Edition - Clinical stage from 07/04/2019: Stage IIB (cT2, cN1(f), cM0, G3, ER+, PR-, HER2+) - Signed by Nicholas Lose, MD on 07/05/2019   07/14/2019 -  Chemotherapy   The patient had dexamethasone (DECADRON) 4 MG tablet, 4 mg (100 % of original dose 4 mg), Oral, Daily, 1 of 1 cycle, Start date: 07/05/2019, End date: 08/04/2019 Dose modification: 4 mg (original dose 4 mg, Cycle 0) palonosetron (ALOXI) injection 0.25 mg, 0.25 mg, Intravenous,  Once, 4 of 6 cycles Administration: 0.25 mg (07/14/2019), 0.25 mg (08/04/2019), 0.25 mg (08/25/2019), 0.25 mg (09/14/2019) pegfilgrastim-jmdb (FULPHILA) injection 6 mg, 6 mg,  Subcutaneous,  Once, 4 of 6 cycles Administration: 6 mg (07/17/2019), 6 mg (08/07/2019), 6 mg (08/29/2019) CARBOplatin (PARAPLATIN) 700 mg in sodium chloride 0.9 % 250 mL chemo infusion, 700 mg (100 % of original dose 700 mg), Intravenous,  Once, 4 of 6 cycles Dose modification: 700 mg (original dose 700 mg, Cycle 1, Reason: Provider Judgment), 600 mg (original dose 700 mg, Cycle 2, Reason: Dose not tolerated) Administration: 700 mg (07/14/2019), 600 mg (08/04/2019), 600 mg (08/25/2019), 600 mg (09/14/2019) DOCEtaxel (TAXOTERE) 170 mg in sodium chloride 0.9 % 250 mL chemo infusion, 75 mg/m2 = 170 mg, Intravenous,  Once, 4 of 6 cycles Dose modification: 65 mg/m2 (original dose 75 mg/m2, Cycle 2, Reason: Dose not tolerated) Administration: 170 mg (07/14/2019), 150 mg (08/04/2019), 150 mg (08/25/2019), 150 mg (09/14/2019) fosaprepitant (EMEND) 150 mg in sodium chloride 0.9 % 145 mL IVPB, 150 mg, Intravenous,  Once, 4 of 6 cycles Administration: 150 mg (07/14/2019), 150 mg (08/04/2019), 150 mg (08/25/2019), 150 mg (09/14/2019) pertuzumab (PERJETA) 840 mg in sodium chloride 0.9 % 250 mL chemo infusion, 840 mg, Intravenous, Once, 4 of 6 cycles Administration: 840 mg (07/14/2019), 420 mg (08/04/2019), 420 mg (08/25/2019), 420 mg (09/14/2019) trastuzumab-dkst (OGIVRI) 900 mg in sodium chloride 0.9 % 250 mL chemo infusion, 903 mg, Intravenous,  Once, 4 of 6 cycles Administration: 900 mg (07/14/2019), 672 mg (08/04/2019), 672 mg (08/25/2019), 672 mg (09/14/2019)  for chemotherapy treatment.    07/22/2019 Genetic Testing   Negative genetic testing:  No pathogenic variants detected on the [facility] Common Hereditary Cancers Panel. The report  date is 07/22/2019.  The Common Hereditary Cancers Panel offered by [facility] includes sequencing and/or deletion duplication testing of the following 48 genes: APC, ATM, AXIN2, BARD1, BMPR1A, BRCA1, BRCA2, BRIP1, CDH1, CDK4, CDKN2A (p14ARF), CDKN2A (p16INK4a), CHEK2, CTNNA1, DICER1, EPCAM  (Deletion/duplication testing only), GREM1 (promoter region deletion/duplication testing only), KIT, MEN1, MLH1, MSH2, MSH3, MSH6, MUTYH, NBN, NF1, NHTL1, PALB2, PDGFRA, PMS2, POLD1, POLE, PTEN, RAD50, RAD51C, RAD51D, RNF43, SDHB, SDHC, SDHD, SMAD4, SMARCA4. STK11, TP53, TSC1, TSC2, and VHL.  The following genes were evaluated for sequence changes only: SDHA and HOXB13 c.251G>A variant only.      CHIEF COMPLIANT: Cycle 2 TCH Perjeta  INTERVAL HISTORY: Heidi Costa is a 39 y.o. with a history of HER-2 positive breast cancer who is currently on neoadjuvant chemotherapy with [name]. She presents to the clinic today for cycle 4.  She reports that she has mild fatigue with her last treatment.  She also had some diarrhea which is improved with her use of over-the-counter medications.  She has history of ongoing hip and leg pain as well as lower back pain.  She requests a refill of her pain medications today.  She denies chest pain, shortness of breath, nausea, vomiting, fevers, chills.  ALLERGIES:  is allergic to aspirin.  MEDICATIONS:  Current Outpatient Medications  Medication Sig Dispense Refill   acetaminophen (TYLENOL) 500 MG tablet Take 500-1,000 mg by mouth daily as needed for pain.      albuterol (VENTOLIN HFA) 108 (90 Base) MCG/ACT inhaler Inhale 2 puffs into the lungs every 6 (six) hours as needed for wheezing or shortness of breath. 18 g 2   amLODipine (NORVASC) 5 MG tablet Take 1 tablet (5 mg total) by mouth daily. 90 tablet 1   buPROPion (WELLBUTRIN XL) 300 MG 24 hr tablet Take 1 tablet (300 mg total) by mouth daily. 1 tab qam x 7 days then increase to 2 pills 90 tablet 1   CHANTIX STARTING MONTH PAK 0.5 MG X 11 & 1 MG X 42 tablet      fluticasone (FLOVENT HFA) 44 MCG/ACT inhaler Inhale 1 puff into the lungs 2 (two) times daily as needed (asthma). 1 Inhaler 5   ibuprofen (ADVIL) 200 MG tablet Take 200-400 mg by mouth every 6 (six) hours as needed for mild pain.      lidocaine-prilocaine (EMLA) cream Apply to affected area once 30 g 3   lisinopril (ZESTRIL) 20 MG tablet Take 1 tablet (20 mg total) by mouth daily. 90 tablet 1   LORazepam (ATIVAN) 0.5 MG tablet Take 1 tablet (0.5 mg total) by mouth at bedtime as needed for sleep. 30 tablet 3   ondansetron (ZOFRAN ODT) 8 MG disintegrating tablet Take 1 tablet (8 mg total) by mouth every 8 (eight) hours as needed for nausea or vomiting. 20 tablet 0   ondansetron (ZOFRAN) 8 MG tablet Take 1 tablet (8 mg total) by mouth 2 (two) times daily as needed (Nausea or vomiting). Begin 4 days after chemotherapy. 30 tablet 1   oxyCODONE-acetaminophen (PERCOCET/ROXICET) 5-325 MG tablet Take 1 tablet by mouth every 8 (eight) hours as needed for severe pain. 15 tablet 0   prochlorperazine (COMPAZINE) 10 MG tablet Take 1 tablet (10 mg total) by mouth every 6 (six) hours as needed (Nausea or vomiting). 30 tablet 1   Vitamin D, Ergocalciferol, (DRISDOL) 1.25 MG (50000 UNIT) CAPS capsule Take 50,000 Units by mouth once a week.     No current facility-administered medications for this visit.  Review of Systems  Constitutional: Positive for malaise/fatigue. Negative for chills, diaphoresis and fever.  HENT: Negative for sore throat.   Respiratory: Negative for cough, sputum production and shortness of breath.   Cardiovascular: Negative for chest pain, palpitations, orthopnea and claudication.  Gastrointestinal: Positive for diarrhea. Negative for constipation, nausea and vomiting.  Genitourinary: Negative for dysuria, frequency and urgency.  Musculoskeletal: Positive for back pain and myalgias.  Neurological: Negative for dizziness and headaches.    PHYSICAL EXAMINATION: ECOG PERFORMANCE STATUS: 1 - Symptomatic but completely ambulatory  Vitals:   09/14/19 1057  BP: (!) 106/54  Pulse: 91  Resp: 17  Temp: (!) 97.5 F (36.4 C)  SpO2: 100%   Filed Weights   09/14/19 1057  Weight: 244 lb (110.7 kg)   Physical  Exam Constitutional:      General: She is not in acute distress.    Appearance: Normal appearance. She is normal weight. She is not ill-appearing, toxic-appearing or diaphoretic.  HENT:     Head: Normocephalic and atraumatic.  Eyes:     General: No scleral icterus.       Right eye: No discharge.        Left eye: No discharge.     Conjunctiva/sclera: Conjunctivae normal.  Cardiovascular:     Rate and Rhythm: Normal rate and regular rhythm.     Heart sounds: Normal heart sounds. No murmur heard.  No friction rub. No gallop.   Pulmonary:     Effort: Pulmonary effort is normal. No respiratory distress.     Breath sounds: Normal breath sounds. No wheezing, rhonchi or rales.  Skin:    General: Skin is warm and dry.  Neurological:     Mental Status: She is alert.     Coordination: Coordination normal.     Gait: Gait normal.  Psychiatric:        Mood and Affect: Mood normal.        Behavior: Behavior normal.        Thought Content: Thought content normal.        Judgment: Judgment normal.      LABORATORY DATA:  I have reviewed the data as listed CMP Latest Ref Rng & Units 09/14/2019 08/25/2019 08/11/2019  Glucose 70 - 99 mg/dL 101(H) 146(H) 137(H)  BUN 6 - 20 mg/dL 11 11 9   Creatinine 0.44 - 1.00 mg/dL 0.84 0.86 1.03(H)  Sodium 135 - 145 mmol/L 142 144 134(L)  Potassium 3.5 - 5.1 mmol/L 3.8 4.0 4.1  Chloride 98 - 111 mmol/L 106 110 100  CO2 22 - 32 mmol/L 27 22 22   Calcium 8.9 - 10.3 mg/dL 8.7(L) 8.7(L) 9.3  Total Protein 6.5 - 8.1 g/dL 5.6(L) 5.8(L) 7.4  Total Bilirubin 0.3 - 1.2 mg/dL 0.4 0.2(L) 0.4  Alkaline Phos 38 - 126 U/L 50 54 57  AST 15 - 41 U/L 16 15 20   ALT 0 - 44 U/L 18 19 20     Lab Results  Component Value Date   WBC 6.2 09/14/2019   HGB 9.0 (L) 09/14/2019   HCT 27.2 (L) 09/14/2019   MCV 100.7 (H) 09/14/2019   PLT 164 09/14/2019   NEUTROABS 3.4 09/14/2019    ASSESSMENT & PLAN:  Malignant neoplasm of upper-outer quadrant of right breast in female,  estrogen receptor positive ([place]) 06/21/2019:Patient palpated a painful right breast lump x3 weeks. Mammogram showed a 2.0cm right breast mass at the 10 o'clock position, a 3.6cm enlarged right axillary lymph node, and a 0.5cm left breast mass. Biopsy  showed, in the right breast and axilla(lymph node), IDC, grade 3, HER-2 equivocal by IHC, + by FISH, ER+ 40%, PR - 0%, Ki67 80%,  left breast, fibrocystic changes, no malignancy.Can be followed.  Treatment plan: 1. Neoadjuvant chemotherapy with TCH Perjeta 6 cycles followed by HerceptinPerjeta versus Kadcylamaintenance for 1 year 2. Followed by breast conserving surgery if possible with sentinel lymph node study 3. Followed by adjuvant radiation therapy if patient had lumpectomy 4.Consideration for neratinib Genetic counseling Breast MRI on 07/07/19 showed the 2.9cm biopsy-proven malignancy in the right breast and axilla, highly suspicious non-mass enhancement in the lateral right breast spanning 14.5cm, and no left breast malignancy ----------------------------------------------------------------------------------------------------------------------------------------------------------- Current treatment: Cycle 1 day 8 TCH Perjeta.  Chemotherapy started 07/12/2019 Echocardiogram 07/05/2019  Chemo toxicities: 1.  Diarrhea required IV fluids: Dose reduced with cycle 2 2.  Fatigue 3.  Nausea  Major increase in appetite: Discontinued oral Decadron. Diffuse body pains: I prescribed her Percocets to be used as needed.  Patient could not undergo biopsy of the 2 additional non-mass enhancement sites because she was feeling queasy and diaphoretic.  She is scheduled for next week. Return to clinic in 3 weeks for cycle 5   Sandi Mealy, MHS, PA-C

## 2019-09-14 NOTE — Patient Instructions (Signed)

## 2019-09-15 ENCOUNTER — Ambulatory Visit: Payer: BC Managed Care – PPO

## 2019-09-15 ENCOUNTER — Other Ambulatory Visit: Payer: BC Managed Care – PPO

## 2019-09-16 ENCOUNTER — Other Ambulatory Visit: Payer: Self-pay

## 2019-09-16 ENCOUNTER — Inpatient Hospital Stay: Payer: BC Managed Care – PPO

## 2019-09-16 VITALS — BP 121/80 | HR 105 | Temp 98.2°F | Resp 20

## 2019-09-16 DIAGNOSIS — Z170 Estrogen receptor positive status [ER+]: Secondary | ICD-10-CM

## 2019-09-16 DIAGNOSIS — C50411 Malignant neoplasm of upper-outer quadrant of right female breast: Secondary | ICD-10-CM | POA: Diagnosis not present

## 2019-09-16 MED ORDER — PEGFILGRASTIM-JMDB 6 MG/0.6ML ~~LOC~~ SOSY
6.0000 mg | PREFILLED_SYRINGE | Freq: Once | SUBCUTANEOUS | Status: AC
  Administered 2019-09-16: 6 mg via SUBCUTANEOUS

## 2019-09-18 ENCOUNTER — Ambulatory Visit: Payer: BC Managed Care – PPO

## 2019-09-21 ENCOUNTER — Telehealth: Payer: Self-pay | Admitting: *Deleted

## 2019-09-21 ENCOUNTER — Telehealth: Payer: Self-pay | Admitting: Medical

## 2019-09-21 DIAGNOSIS — C50411 Malignant neoplasm of upper-outer quadrant of right female breast: Secondary | ICD-10-CM

## 2019-09-21 NOTE — Telephone Encounter (Signed)
Scheduled appt per 6/24 sch - unable to reach pt . Left message with appt date and time

## 2019-09-21 NOTE — Telephone Encounter (Signed)
Received call from pt with complaint of nausea and increase weakness.  Pt states she is unable to eat due to no appetite and is drinking only 16 ounces of water a day.  Pt states nausea is not relieved by Zofran or Compazine.  Per MD, pt needing to be seen in [facility].  Apt scheduled and pt verbalized understanding.

## 2019-09-22 ENCOUNTER — Inpatient Hospital Stay: Payer: BC Managed Care – PPO

## 2019-09-22 ENCOUNTER — Other Ambulatory Visit: Payer: Self-pay

## 2019-09-22 ENCOUNTER — Inpatient Hospital Stay (HOSPITAL_BASED_OUTPATIENT_CLINIC_OR_DEPARTMENT_OTHER): Payer: BC Managed Care – PPO | Admitting: Medical

## 2019-09-22 DIAGNOSIS — Z170 Estrogen receptor positive status [ER+]: Secondary | ICD-10-CM

## 2019-09-22 DIAGNOSIS — E876 Hypokalemia: Secondary | ICD-10-CM | POA: Diagnosis not present

## 2019-09-22 DIAGNOSIS — C50411 Malignant neoplasm of upper-outer quadrant of right female breast: Secondary | ICD-10-CM

## 2019-09-22 DIAGNOSIS — Z95828 Presence of other vascular implants and grafts: Secondary | ICD-10-CM | POA: Diagnosis not present

## 2019-09-22 LAB — CBC WITH DIFFERENTIAL (CANCER CENTER ONLY)
Abs Immature Granulocytes: 1.03 10*3/uL — ABNORMAL HIGH (ref 0.00–0.07)
Basophils Absolute: 0 10*3/uL (ref 0.0–0.1)
Basophils Relative: 0 %
Eosinophils Absolute: 0 10*3/uL (ref 0.0–0.5)
Eosinophils Relative: 0 %
HCT: 26 % — ABNORMAL LOW (ref 36.0–46.0)
Hemoglobin: 8.8 g/dL — ABNORMAL LOW (ref 12.0–15.0)
Immature Granulocytes: 8 %
Lymphocytes Relative: 23 %
Lymphs Abs: 3.1 10*3/uL (ref 0.7–4.0)
MCH: 34 pg (ref 26.0–34.0)
MCHC: 33.8 g/dL (ref 30.0–36.0)
MCV: 100.4 fL — ABNORMAL HIGH (ref 80.0–100.0)
Monocytes Absolute: 2.2 10*3/uL — ABNORMAL HIGH (ref 0.1–1.0)
Monocytes Relative: 17 %
Neutro Abs: 7.1 10*3/uL (ref 1.7–7.7)
Neutrophils Relative %: 52 %
Platelet Count: 89 10*3/uL — ABNORMAL LOW (ref 150–400)
RBC: 2.59 MIL/uL — ABNORMAL LOW (ref 3.87–5.11)
RDW: 17.9 % — ABNORMAL HIGH (ref 11.5–15.5)
WBC Count: 13.4 10*3/uL — ABNORMAL HIGH (ref 4.0–10.5)
nRBC: 3.7 % — ABNORMAL HIGH (ref 0.0–0.2)

## 2019-09-22 LAB — CMP (CANCER CENTER ONLY)
ALT: 20 U/L (ref 0–44)
AST: 17 U/L (ref 15–41)
Albumin: 3.5 g/dL (ref 3.5–5.0)
Alkaline Phosphatase: 59 U/L (ref 38–126)
Anion gap: 16 — ABNORMAL HIGH (ref 5–15)
BUN: 11 mg/dL (ref 6–20)
CO2: 22 mmol/L (ref 22–32)
Calcium: 8.9 mg/dL (ref 8.9–10.3)
Chloride: 102 mmol/L (ref 98–111)
Creatinine: 1.18 mg/dL — ABNORMAL HIGH (ref 0.44–1.00)
GFR, Est AFR Am: >60 mL/min (ref >60)
GFR, Estimated: 58 mL/min — ABNORMAL LOW (ref >60)
Glucose, Bld: 124 mg/dL — ABNORMAL HIGH (ref 70–99)
Potassium: 3.2 mmol/L — ABNORMAL LOW (ref 3.5–5.1)
Sodium: 140 mmol/L (ref 135–145)
Total Bilirubin: 0.3 mg/dL (ref 0.3–1.2)
Total Protein: 6.3 g/dL — ABNORMAL LOW (ref 6.5–8.1)

## 2019-09-22 LAB — MAGNESIUM: Magnesium: 1.5 mg/dL — ABNORMAL LOW (ref 1.7–2.4)

## 2019-09-22 MED ORDER — POTASSIUM CHLORIDE CRYS ER 20 MEQ PO TBCR
40.0000 meq | EXTENDED_RELEASE_TABLET | Freq: Once | ORAL | Status: AC
  Administered 2019-09-22: 40 meq via ORAL

## 2019-09-22 MED ORDER — SODIUM CHLORIDE 0.9 % IV SOLN
Freq: Once | INTRAVENOUS | Status: AC
  Filled 2019-09-22: qty 1000

## 2019-09-22 MED ORDER — SODIUM CHLORIDE 0.9% FLUSH
10.0000 mL | Freq: Once | INTRAVENOUS | Status: AC
  Administered 2019-09-22: 10 mL
  Filled 2019-09-22: qty 10

## 2019-09-22 MED ORDER — HEPARIN SOD (PORK) LOCK FLUSH 100 UNIT/ML IV SOLN
500.0000 [IU] | Freq: Once | INTRAVENOUS | Status: AC
  Administered 2019-09-22: 500 [IU]
  Filled 2019-09-22: qty 5

## 2019-09-22 MED ORDER — POTASSIUM CHLORIDE CRYS ER 20 MEQ PO TBCR: 20.0000 meq | EXTENDED_RELEASE_TABLET | Freq: Every day | ORAL | 0 refills | Status: DC

## 2019-09-22 MED ORDER — POTASSIUM CHLORIDE CRYS ER 20 MEQ PO TBCR
EXTENDED_RELEASE_TABLET | ORAL | Status: AC
  Filled 2019-09-22: qty 2

## 2019-09-22 MED ORDER — MAGNESIUM SULFATE 4 GM/100ML IV SOLN: 4.0000 g | Freq: Once | INTRAVENOUS | Status: DC

## 2019-09-22 NOTE — Patient Instructions (Signed)
Hypokalemia Hypokalemia means that the amount of potassium in the blood is lower than normal. Potassium is a chemical (electrolyte) that helps regulate the amount of fluid in the body. It also stimulates muscle tightening (contraction) and helps nerves work properly. Normally, most of the body's potassium is inside cells, and only a very small amount is in the blood. Because the amount in the blood is so small, minor changes to potassium levels in the blood can be life-threatening. What are the causes? This condition may be caused by:  Antibiotic medicine.  Diarrhea or vomiting. Taking too much of a medicine that helps you have a bowel movement (laxative) can cause diarrhea and lead to hypokalemia.  Chronic kidney disease (CKD).  Medicines that help the body get rid of excess fluid (diuretics).  Eating disorders, such as bulimia.  Low magnesium levels in the body.  Sweating a lot. What are the signs or symptoms? Symptoms of this condition include:  Weakness.  Constipation.  Fatigue.  Muscle cramps.  Mental confusion.  Skipped heartbeats or irregular heartbeat (palpitations).  Tingling or numbness. How is this diagnosed? This condition is diagnosed with a blood test. How is this treated? This condition may be treated by:  Taking potassium supplements by mouth.  Adjusting the medicines that you take.  Eating more foods that contain a lot of potassium. If your potassium level is very low, you may need to get potassium through an IV and be monitored in the hospital. Follow these instructions at home:   Take over-the-counter and prescription medicines only as told by your health care provider. This includes vitamins and supplements.  Eat a healthy diet. A healthy diet includes fresh fruits and vegetables, whole grains, healthy fats, and lean proteins.  If instructed, eat more foods that contain a lot of potassium. This includes: ? Nuts, such as peanuts and  pistachios. ? Seeds, such as sunflower seeds and pumpkin seeds. ? Peas, lentils, and lima beans. ? Whole grain and bran cereals and breads. ? Fresh fruits and vegetables, such as apricots, avocado, bananas, cantaloupe, kiwi, oranges, tomatoes, asparagus, and potatoes. ? Orange juice. ? Tomato juice. ? Red meats. ? Yogurt.  Keep all follow-up visits as told by your health care provider. This is important. Contact a health care provider if you:  Have weakness that gets worse.  Feel your heart pounding or racing.  Vomit.  Have diarrhea.  Have diabetes (diabetes mellitus) and you have trouble keeping your blood sugar (glucose) in your target range. Get help right away if you:  Have chest pain.  Have shortness of breath.  Have vomiting or diarrhea that lasts for more than 2 days.  Faint. Summary  Hypokalemia means that the amount of potassium in the blood is lower than normal.  This condition is diagnosed with a blood test.  Hypokalemia may be treated by taking potassium supplements, adjusting the medicines that you take, or eating more foods that are high in potassium.  If your potassium level is very low, you may need to get potassium through an IV and be monitored in the hospital. This information is not intended to replace advice given to you by your health care provider. Make sure you discuss any questions you have with your health care provider. Document Revised: 10/27/2017 Document Reviewed: 10/27/2017 Elsevier Patient Education  2020 [employer]    Hypomagnesemia Hypomagnesemia is a condition in which the level of magnesium in the blood is low. Magnesium is a mineral that is found in many   foods. It is used in many different processes in the body. Hypomagnesemia can affect every organ in the body. In severe cases, it can cause life-threatening problems. What are the causes? This condition may be caused by:  Not getting enough magnesium in your  diet.  Malnutrition.  Problems with absorbing magnesium from the intestines.  Dehydration.  Alcohol abuse.  Vomiting.  Severe or chronic diarrhea.  Some medicines, including medicines that make you urinate more (diuretics).  Certain diseases, such as kidney disease, diabetes, celiac disease, and overactive thyroid. What are the signs or symptoms? Symptoms of this condition include:  Loss of appetite.  Nausea and vomiting.  Involuntary shaking or trembling of a body part (tremor).  Muscle weakness.  Tingling in the arms and legs.  Sudden tightening of muscles (muscle spasms).  Confusion.  Psychiatric issues, such as depression, irritability, or psychosis.  A feeling of fluttering of the heart.  Seizures. These symptoms are more severe if magnesium levels drop suddenly. How is this diagnosed? This condition may be diagnosed based on:  Your symptoms and medical history.  A physical exam.  Blood and urine tests. How is this treated? Treatment depends on the cause and the severity of the condition. It may be treated with:  A magnesium supplement. This can be taken in pill form. If the condition is severe, magnesium is usually given through an IV.  Changes to your diet. You may be directed to eat foods that have a lot of magnesium, such as green leafy vegetables, peas, beans, and nuts.  Stopping any intake of alcohol. Follow these instructions at home:      Make sure that your diet includes foods with magnesium. Foods that have a lot of magnesium in them include: ? Green leafy vegetables, such as spinach and broccoli. ? Beans and peas. ? Nuts and seeds, such as almonds and sunflower seeds. ? Whole grains, such as whole grain bread and fortified cereals.  Take magnesium supplements if your health care provider tells you to do that. Take them as directed.  Take over-the-counter and prescription medicines only as told by your health care provider.  Have  your magnesium levels monitored as told by your health care provider.  When you are active, drink fluids that contain electrolytes.  Avoid drinking alcohol.  Keep all follow-up visits as told by your health care provider. This is important. Contact a health care provider if:  You get worse instead of better.  Your symptoms return. Get help right away if you:  Develop severe muscle weakness.  Have trouble breathing.  Feel that your heart is racing. Summary  Hypomagnesemia is a condition in which the level of magnesium in the blood is low.  Hypomagnesemia can affect every organ in the body.  Treatment may include eating more foods that contain magnesium, taking magnesium supplements, and not drinking alcohol.  Have your magnesium levels monitored as told by your health care provider. This information is not intended to replace advice given to you by your health care provider. Make sure you discuss any questions you have with your health care provider. Document Revised: 02/26/2017 Document Reviewed: 02/15/2017 Elsevier Patient Education  2020 Elsevier Inc.   

## 2019-09-22 NOTE — Progress Notes (Signed)
Patient Care Team: Rutherford Guys, MD as PCP - General (Family Medicine) Rockwell Germany, RN as Oncology Nurse Navigator Mauro Kaufmann, RN as Oncology Nurse Navigator Rolm Bookbinder, MD as Consulting Physician (General Surgery) Nicholas Lose, MD as Consulting Physician (Hematology and Oncology) Kyung Rudd, MD as Consulting Physician (Radiation Oncology)  DIAGNOSIS:    ICD-10-CM   1. Hypomagnesemia  E83.42 sodium chloride 0.9 % 1,000 mL with magnesium sulfate 4 g infusion    DISCONTINUED: magnesium sulfate IVPB 4 g 100 mL  2. Hypokalemia  E87.6 potassium chloride SA (KLOR-CON) CR tablet 40 mEq  3. Port-A-Cath in place  Z95.828 heparin lock flush 100 unit/mL    sodium chloride flush (NS) 0.9 % injection 10 mL  4. Malignant neoplasm of upper-outer quadrant of right breast in female, estrogen receptor positive (HCC)  C50.411 heparin lock flush 100 unit/mL   Z17.0 sodium chloride flush (NS) 0.9 % injection 10 mL   Hypomagnesemia: A magnesium level returned at 1.5 today. The patient was given magnesium sulfate 4 grams in 1 liter of normal saline IV x 1 today.  Hypokalemia: A potassium level returned at 3.2 today. She was given potassium chloride 40 mEq p.o. x1 and was sent home with a prescription for potassium chloride 20 mEq once daily for 10 days.  ER positive malignant neoplasm of the right breast: Heidi Costa continues to be followed by Dr. Lindi Adie and is status post cycle 4 of neoadjuvant chemotherapy with [place].  She is scheduled to be seen next on 10/05/2019.  SUMMARY OF ONCOLOGIC HISTORY: Oncology History  Malignant neoplasm of upper-outer quadrant of right breast in female, estrogen receptor positive ([place])  06/21/2019 Initial Diagnosis   Patient palpated a painful right breast lump x3 weeks. Mammogram showed a 2.0cm right breast mass at the 10 o'clock position, a 3.6cm enlarged right axillary lymph node, and a 0.5cm left breast mass. Biopsy showed, in the right breast  and axilla, IDC, grade 3, HER-2 equivocal by IHC, + by FISH, ER+ 40%, PR - 0%, Ki67 80%, and in the left breast, fibrocystic changes, no malignancy.    07/04/2019 Cancer Staging   Staging form: Breast, AJCC 8th Edition - Clinical stage from 07/04/2019: Stage IIB (cT2, cN1(f), cM0, G3, ER+, PR-, HER2+) - Signed by Nicholas Lose, MD on 07/05/2019   07/14/2019 -  Chemotherapy   The patient had dexamethasone (DECADRON) 4 MG tablet, 4 mg (100 % of original dose 4 mg), Oral, Daily, 1 of 1 cycle, Start date: 07/05/2019, End date: 08/04/2019 Dose modification: 4 mg (original dose 4 mg, Cycle 0) palonosetron (ALOXI) injection 0.25 mg, 0.25 mg, Intravenous,  Once, 4 of 6 cycles Administration: 0.25 mg (07/14/2019), 0.25 mg (08/04/2019), 0.25 mg (08/25/2019), 0.25 mg (09/14/2019) pegfilgrastim-jmdb (FULPHILA) injection 6 mg, 6 mg, Subcutaneous,  Once, 4 of 6 cycles Administration: 6 mg (07/17/2019), 6 mg (08/07/2019), 6 mg (08/29/2019) CARBOplatin (PARAPLATIN) 700 mg in sodium chloride 0.9 % 250 mL chemo infusion, 700 mg (100 % of original dose 700 mg), Intravenous,  Once, 4 of 6 cycles Dose modification: 700 mg (original dose 700 mg, Cycle 1, Reason: Provider Judgment), 600 mg (original dose 700 mg, Cycle 2, Reason: Dose not tolerated) Administration: 700 mg (07/14/2019), 600 mg (08/04/2019), 600 mg (08/25/2019), 600 mg (09/14/2019) DOCEtaxel (TAXOTERE) 170 mg in sodium chloride 0.9 % 250 mL chemo infusion, 75 mg/m2 = 170 mg, Intravenous,  Once, 4 of 6 cycles Dose modification: 65 mg/m2 (original dose 75 mg/m2, Cycle 2, Reason: Dose  not tolerated) Administration: 170 mg (07/14/2019), 150 mg (08/04/2019), 150 mg (08/25/2019), 150 mg (09/14/2019) fosaprepitant (EMEND) 150 mg in sodium chloride 0.9 % 145 mL IVPB, 150 mg, Intravenous,  Once, 4 of 6 cycles Administration: 150 mg (07/14/2019), 150 mg (08/04/2019), 150 mg (08/25/2019), 150 mg (09/14/2019) pertuzumab (PERJETA) 840 mg in sodium chloride 0.9 % 250 mL chemo infusion, 840 mg,  Intravenous, Once, 4 of 6 cycles Administration: 840 mg (07/14/2019), 420 mg (08/04/2019), 420 mg (08/25/2019), 420 mg (09/14/2019) trastuzumab-dkst (OGIVRI) 900 mg in sodium chloride 0.9 % 250 mL chemo infusion, 903 mg, Intravenous,  Once, 4 of 6 cycles Administration: 900 mg (07/14/2019), 672 mg (08/04/2019), 672 mg (08/25/2019), 672 mg (09/14/2019)  for chemotherapy treatment.    07/22/2019 Genetic Testing   Negative genetic testing:  No pathogenic variants detected on the [facility] Common Hereditary Cancers Panel. The report date is 07/22/2019.  The Common Hereditary Cancers Panel offered by [facility] includes sequencing and/or deletion duplication testing of the following 48 genes: APC, ATM, AXIN2, BARD1, BMPR1A, BRCA1, BRCA2, BRIP1, CDH1, CDK4, CDKN2A (p14ARF), CDKN2A (p16INK4a), CHEK2, CTNNA1, DICER1, EPCAM (Deletion/duplication testing only), GREM1 (promoter region deletion/duplication testing only), KIT, MEN1, MLH1, MSH2, MSH3, MSH6, MUTYH, NBN, NF1, NHTL1, PALB2, PDGFRA, PMS2, POLD1, POLE, PTEN, RAD50, RAD51C, RAD51D, RNF43, SDHB, SDHC, SDHD, SMAD4, SMARCA4. STK11, TP53, TSC1, TSC2, and VHL.  The following genes were evaluated for sequence changes only: SDHA and HOXB13 c.251G>A variant only.      CHIEF COMPLIANT: Cycle 2 [facility]  INTERVAL HISTORY: Heidi Costa is a 39 y.o. with a history of HER-2 positive breast cancer who is followed by Dr. Lindi Adie and is status post cycle 4 of neoadjuvant chemotherapy with [place].  Her office received a call from the patient stating that she was having nausea and increased weakness.  She was only drinking around 16 ounces of water a day.  Additionally she has been having diarrhea and anorexia.  She has been taking Zofran and Compazine without good relief of her nausea.  She denies fevers, chills, or sweats.  ALLERGIES:  is allergic to aspirin.  MEDICATIONS:  Current Outpatient Medications  Medication Sig Dispense Refill  . acetaminophen (TYLENOL) 500  MG tablet Take 500-1,000 mg by mouth daily as needed for pain.     Marland Kitchen albuterol (VENTOLIN HFA) 108 (90 Base) MCG/ACT inhaler Inhale 2 puffs into the lungs every 6 (six) hours as needed for wheezing or shortness of breath. 18 g 2  . amLODipine (NORVASC) 5 MG tablet Take 1 tablet (5 mg total) by mouth daily. 90 tablet 1  . buPROPion (WELLBUTRIN XL) 300 MG 24 hr tablet Take 1 tablet (300 mg total) by mouth daily. 1 tab qam x 7 days then increase to 2 pills 90 tablet 1  . CHANTIX STARTING MONTH PAK 0.5 MG X 11 & 1 MG X 42 tablet     . fluticasone (FLOVENT HFA) 44 MCG/ACT inhaler Inhale 1 puff into the lungs 2 (two) times daily as needed (asthma). 1 Inhaler 5  . ibuprofen (ADVIL) 200 MG tablet Take 200-400 mg by mouth every 6 (six) hours as needed for mild pain.    Marland Kitchen lidocaine-prilocaine (EMLA) cream Apply to affected area once 30 g 3  . lisinopril (ZESTRIL) 20 MG tablet Take 1 tablet (20 mg total) by mouth daily. 90 tablet 1  . LORazepam (ATIVAN) 0.5 MG tablet Take 1 tablet (0.5 mg total) by mouth at bedtime as needed for sleep. 30 tablet 3  . ondansetron (ZOFRAN  ODT) 8 MG disintegrating tablet Take 1 tablet (8 mg total) by mouth every 8 (eight) hours as needed for nausea or vomiting. 20 tablet 0  . ondansetron (ZOFRAN) 8 MG tablet Take 1 tablet (8 mg total) by mouth 2 (two) times daily as needed (Nausea or vomiting). Begin 4 days after chemotherapy. 30 tablet 1  . oxyCODONE-acetaminophen (PERCOCET/ROXICET) 5-325 MG tablet Take 1 tablet by mouth every 8 (eight) hours as needed for severe pain. 15 tablet 0  . potassium chloride SA (KLOR-CON) 20 MEQ tablet Take 1 tablet (20 mEq total) by mouth daily. 10 tablet 0  . prochlorperazine (COMPAZINE) 10 MG tablet Take 1 tablet (10 mg total) by mouth every 6 (six) hours as needed (Nausea or vomiting). 30 tablet 1  . Vitamin D, Ergocalciferol, (DRISDOL) 1.25 MG (50000 UNIT) CAPS capsule Take 50,000 Units by mouth once a week.     No current facility-administered  medications for this visit.   Review of Systems  Constitutional: Positive for malaise/fatigue. Negative for chills, diaphoresis and fever.  HENT: Negative for sore throat.   Respiratory: Negative for cough, sputum production and shortness of breath.   Cardiovascular: Negative for chest pain, palpitations, orthopnea and claudication.  Gastrointestinal: Positive for diarrhea and nausea. Negative for constipation and vomiting.  Genitourinary: Negative for dysuria, frequency and urgency.  Musculoskeletal: Negative for back pain and myalgias.  Neurological: Negative for dizziness and headaches.    PHYSICAL EXAMINATION: ECOG PERFORMANCE STATUS: 1 - Symptomatic but completely ambulatory  Vitals:   09/22/19 1035  BP: 127/73  Pulse: 98  Resp: 17  Temp: 98.5 F (36.9 C)  SpO2: 100%   Filed Weights   09/22/19 1035  Weight: 230 lb 12.8 oz (104.7 kg)   Physical Exam Constitutional:      General: She is not in acute distress.    Appearance: Normal appearance. She is normal weight. She is not ill-appearing, toxic-appearing or diaphoretic.  HENT:     Head: Normocephalic and atraumatic.  Eyes:     General: No scleral icterus.       Right eye: No discharge.        Left eye: No discharge.     Conjunctiva/sclera: Conjunctivae normal.  Cardiovascular:     Rate and Rhythm: Normal rate and regular rhythm.     Heart sounds: Normal heart sounds. No murmur heard.  No friction rub. No gallop.   Pulmonary:     Effort: Pulmonary effort is normal. No respiratory distress.     Breath sounds: Normal breath sounds. No wheezing, rhonchi or rales.  Skin:    General: Skin is warm and dry.  Neurological:     Mental Status: She is alert.     Coordination: Coordination normal.     Gait: Gait normal.  Psychiatric:        Mood and Affect: Mood normal.        Behavior: Behavior normal.        Thought Content: Thought content normal.        Judgment: Judgment normal.      LABORATORY DATA:  I have  reviewed the data as listed CMP Latest Ref Rng & Units 09/22/2019 09/14/2019 08/25/2019  Glucose 70 - 99 mg/dL 124(H) 101(H) 146(H)  BUN 6 - 20 mg/dL _0 Creatinine 0.44 - 1.00 mg/dL 1.18(H) 0.84 0.86  Sodium 135 - 145 mmol/L 140 142 144  Potassium 3.5 - 5.1 mmol/L 3.2(L) 3.8 4.0  Chloride 98 - 111 mmol/L 102 106 110  CO2 22 - 32 mmol/L _0 Calcium 8.9 - 10.3 mg/dL 8.9 8.7(L) 8.7(L)  Total Protein 6.5 - 8.1 g/dL 6.3(L) 5.6(L) 5.8(L)  Total Bilirubin 0.3 - 1.2 mg/dL 0.3 0.4 0.2(L)  Alkaline Phos 38 - 126 U/L 59 50 54  AST 15 - 41 U/L _1 ALT 0 - 44 U/L _2 Lab Results  Component Value Date   WBC 13.4 (H) 09/22/2019   HGB 8.8 (L) 09/22/2019   HCT 26.0 (L) 09/22/2019   MCV 100.4 (H) 09/22/2019   PLT 89 (L) 09/22/2019   NEUTROABS 7.1 09/22/2019     Sandi Mealy, MHS, PA-C

## 2019-09-22 NOTE — Patient Instructions (Signed)

## 2019-10-04 NOTE — Progress Notes (Signed)
Patient Care Team: Rutherford Guys, MD as PCP - General (Family Medicine) Rockwell Germany, RN as Oncology Nurse Navigator Mauro Kaufmann, RN as Oncology Nurse Navigator Rolm Bookbinder, MD as Consulting Physician (General Surgery) Nicholas Lose, MD as Consulting Physician (Hematology and Oncology) Kyung Rudd, MD as Consulting Physician (Radiation Oncology)  DIAGNOSIS:    ICD-10-CM   1. Malignant neoplasm of upper-outer quadrant of right breast in female, estrogen receptor positive ([place])  C50.411    Z17.0     SUMMARY OF ONCOLOGIC HISTORY: Oncology History  Malignant neoplasm of upper-outer quadrant of right breast in female, estrogen receptor positive ([place])  06/21/2019 Initial Diagnosis   Patient palpated a painful right breast lump x3 weeks. Mammogram showed a 2.0cm right breast mass at the 10 o'clock position, a 3.6cm enlarged right axillary lymph node, and a 0.5cm left breast mass. Biopsy showed, in the right breast and axilla, IDC, grade 3, HER-2 equivocal by IHC, + by FISH, ER+ 40%, PR - 0%, Ki67 80%, and in the left breast, fibrocystic changes, no malignancy.    07/04/2019 Cancer Staging   Staging form: Breast, AJCC 8th Edition - Clinical stage from 07/04/2019: Stage IIB (cT2, cN1(f), cM0, G3, ER+, PR-, HER2+) - Signed by Nicholas Lose, MD on 07/05/2019   07/14/2019 -  Chemotherapy   The patient had dexamethasone (DECADRON) 4 MG tablet, 4 mg (100 % of original dose 4 mg), Oral, Daily, 1 of 1 cycle, Start date: 07/05/2019, End date: 08/04/2019 Dose modification: 4 mg (original dose 4 mg, Cycle 0) palonosetron (ALOXI) injection 0.25 mg, 0.25 mg, Intravenous,  Once, 4 of 6 cycles Administration: 0.25 mg (07/14/2019), 0.25 mg (08/04/2019), 0.25 mg (08/25/2019), 0.25 mg (09/14/2019) pegfilgrastim-jmdb (FULPHILA) injection 6 mg, 6 mg, Subcutaneous,  Once, 4 of 6 cycles Administration: 6 mg (07/17/2019), 6 mg (08/07/2019), 6 mg (08/29/2019) CARBOplatin (PARAPLATIN) 700 mg in sodium chloride 0.9 %  250 mL chemo infusion, 700 mg (100 % of original dose 700 mg), Intravenous,  Once, 4 of 6 cycles Dose modification: 700 mg (original dose 700 mg, Cycle 1, Reason: Provider Judgment), 600 mg (original dose 700 mg, Cycle 2, Reason: Dose not tolerated) Administration: 700 mg (07/14/2019), 600 mg (08/04/2019), 600 mg (08/25/2019), 600 mg (09/14/2019) DOCEtaxel (TAXOTERE) 170 mg in sodium chloride 0.9 % 250 mL chemo infusion, 75 mg/m2 = 170 mg, Intravenous,  Once, 4 of 6 cycles Dose modification: 65 mg/m2 (original dose 75 mg/m2, Cycle 2, Reason: Dose not tolerated) Administration: 170 mg (07/14/2019), 150 mg (08/04/2019), 150 mg (08/25/2019), 150 mg (09/14/2019) fosaprepitant (EMEND) 150 mg in sodium chloride 0.9 % 145 mL IVPB, 150 mg, Intravenous,  Once, 4 of 6 cycles Administration: 150 mg (07/14/2019), 150 mg (08/04/2019), 150 mg (08/25/2019), 150 mg (09/14/2019) pertuzumab (PERJETA) 840 mg in sodium chloride 0.9 % 250 mL chemo infusion, 840 mg, Intravenous, Once, 4 of 6 cycles Administration: 840 mg (07/14/2019), 420 mg (08/04/2019), 420 mg (08/25/2019), 420 mg (09/14/2019) trastuzumab-dkst (OGIVRI) 900 mg in sodium chloride 0.9 % 250 mL chemo infusion, 903 mg, Intravenous,  Once, 4 of 6 cycles Administration: 900 mg (07/14/2019), 672 mg (08/04/2019), 672 mg (08/25/2019), 672 mg (09/14/2019)  for chemotherapy treatment.    07/22/2019 Genetic Testing   Negative genetic testing:  No pathogenic variants detected on the [facility] Common Hereditary Cancers Panel. The report date is 07/22/2019.  The Common Hereditary Cancers Panel offered by [facility] includes sequencing and/or deletion duplication testing of the following 48 genes: APC, ATM, AXIN2, BARD1, BMPR1A, BRCA1, BRCA2, BRIP1,  CDH1, CDK4, CDKN2A (p14ARF), CDKN2A (p16INK4a), CHEK2, CTNNA1, DICER1, EPCAM (Deletion/duplication testing only), GREM1 (promoter region deletion/duplication testing only), KIT, MEN1, MLH1, MSH2, MSH3, MSH6, MUTYH, NBN, NF1, NHTL1, PALB2, PDGFRA, PMS2,  POLD1, POLE, PTEN, RAD50, RAD51C, RAD51D, RNF43, SDHB, SDHC, SDHD, SMAD4, SMARCA4. STK11, TP53, TSC1, TSC2, and VHL.  The following genes were evaluated for sequence changes only: SDHA and HOXB13 c.251G>A variant only.      CHIEF COMPLIANT: Cycle 5 TCH Perjeta  INTERVAL HISTORY: Heidi Costa is a 39 y.o. with above-mentioned history of HER-2 positive breast cancer who completed neoadjuvant chemotherapy and is currently on Herceptin Perjeta miantenance. She presents to the clinic today for treatment.  Her major complaint today is fatigue after chemotherapy that lasts for 1 to 2 weeks.  She is feeling very well today.  Denies any mouth sores.  She does have nausea which is well controlled with antinausea medicines.  She also has diarrhea which is well controlled with Imodium.  ALLERGIES:  is allergic to aspirin.  MEDICATIONS:  Current Outpatient Medications  Medication Sig Dispense Refill  . acetaminophen (TYLENOL) 500 MG tablet Take 500-1,000 mg by mouth daily as needed for pain.     Marland Kitchen albuterol (VENTOLIN HFA) 108 (90 Base) MCG/ACT inhaler Inhale 2 puffs into the lungs every 6 (six) hours as needed for wheezing or shortness of breath. 18 g 2  . amLODipine (NORVASC) 5 MG tablet Take 1 tablet (5 mg total) by mouth daily. 90 tablet 1  . buPROPion (WELLBUTRIN XL) 300 MG 24 hr tablet Take 1 tablet (300 mg total) by mouth daily. 1 tab qam x 7 days then increase to 2 pills 90 tablet 1  . CHANTIX STARTING MONTH PAK 0.5 MG X 11 & 1 MG X 42 tablet     . fluticasone (FLOVENT HFA) 44 MCG/ACT inhaler Inhale 1 puff into the lungs 2 (two) times daily as needed (asthma). 1 Inhaler 5  . ibuprofen (ADVIL) 200 MG tablet Take 200-400 mg by mouth every 6 (six) hours as needed for mild pain.    Marland Kitchen lidocaine-prilocaine (EMLA) cream Apply to affected area once 30 g 3  . lisinopril (ZESTRIL) 20 MG tablet Take 1 tablet (20 mg total) by mouth daily. 90 tablet 1  . LORazepam (ATIVAN) 0.5 MG tablet Take 1 tablet (0.5 mg  total) by mouth at bedtime as needed for sleep. 30 tablet 3  . ondansetron (ZOFRAN ODT) 8 MG disintegrating tablet Take 1 tablet (8 mg total) by mouth every 8 (eight) hours as needed for nausea or vomiting. 20 tablet 0  . ondansetron (ZOFRAN) 8 MG tablet Take 1 tablet (8 mg total) by mouth 2 (two) times daily as needed (Nausea or vomiting). Begin 4 days after chemotherapy. 30 tablet 1  . oxyCODONE-acetaminophen (PERCOCET/ROXICET) 5-325 MG tablet Take 1 tablet by mouth every 8 (eight) hours as needed for severe pain. 15 tablet 0  . potassium chloride SA (KLOR-CON) 20 MEQ tablet Take 1 tablet (20 mEq total) by mouth daily. 10 tablet 0  . prochlorperazine (COMPAZINE) 10 MG tablet Take 1 tablet (10 mg total) by mouth every 6 (six) hours as needed (Nausea or vomiting). 30 tablet 1  . Vitamin D, Ergocalciferol, (DRISDOL) 1.25 MG (50000 UNIT) CAPS capsule Take 50,000 Units by mouth once a week.     No current facility-administered medications for this visit.    PHYSICAL EXAMINATION: ECOG PERFORMANCE STATUS: 1 - Symptomatic but completely ambulatory  Vitals:   10/05/19 1043  BP: 140/82  Pulse:  93  Resp: 18  Temp: 98.7 F (37.1 C)  SpO2: 100%   Filed Weights   10/05/19 1043  Weight: 245 lb 14.4 oz (111.5 kg)    LABORATORY DATA:  I have reviewed the data as listed CMP Latest Ref Rng & Units 10/05/2019 09/22/2019 09/14/2019  Glucose 70 - 99 mg/dL 102(H) 124(H) 101(H)  BUN 6 - 20 mg/dL _0 Creatinine 0.44 - 1.00 mg/dL 0.83 1.18(H) 0.84  Sodium 135 - 145 mmol/L 143 140 142  Potassium 3.5 - 5.1 mmol/L 3.5 3.2(L) 3.8  Chloride 98 - 111 mmol/L 105 102 106  CO2 22 - 32 mmol/L _1 Calcium 8.9 - 10.3 mg/dL 8.5(L) 8.9 8.7(L)  Total Protein 6.5 - 8.1 g/dL 5.6(L) 6.3(L) 5.6(L)  Total Bilirubin 0.3 - 1.2 mg/dL 0.3 0.3 0.4  Alkaline Phos 38 - 126 U/L 42 59 50  AST 15 - 41 U/L _2 ALT 0 - 44 U/L _3 Lab Results  Component Value Date   WBC 5.7 10/05/2019   HGB 8.5 (L)  10/05/2019   HCT 26.7 (L) 10/05/2019   MCV 108.1 (H) 10/05/2019   PLT 268 10/05/2019   NEUTROABS 2.8 10/05/2019    ASSESSMENT & PLAN:  Malignant neoplasm of upper-outer quadrant of right breast in female, estrogen receptor positive ([place]) 06/21/2019:Patient palpated a painful right breast lump x3 weeks. Mammogram showed a 2.0cm right breast mass at the 10 o'clock position, a 3.6cm enlarged right axillary lymph node, and a 0.5cm left breast mass. Biopsy showed, in the right breast and axilla(lymph node), IDC, grade 3, HER-2 equivocal by IHC, + by FISH, ER+ 40%, PR - 0%, Ki67 80%,  left breast, fibrocystic changes, no malignancy.Can be followed.  Treatment plan: 1. Neoadjuvant chemotherapy with TCH Perjeta 6 cycles followed by HerceptinPerjeta versus Kadcylamaintenance for 1 year 2. Followed by breast conserving surgery if possible with sentinel lymph node study 3. Followed by adjuvant radiation therapy if patient had lumpectomy 4.Consideration for neratinib Genetic counseling Breast MRI on 07/07/19 showed the 2.9cm biopsy-proven malignancy in the right breast and axilla, highly suspicious non-mass enhancement in the lateral right breast spanning 14.5cm, and no left breast malignancy ----------------------------------------------------------------------------------------------------------------------------------------------------------- Current treatment: Cycle 5 day1TCH Perjeta.Chemotherapy started4/14/2021 Echocardiogram 07/12/2019 EF 60-65%  Chemo toxicities: 1.Diarrhea required IV fluids: Dose reduced with cycle 2 2.Fatigue 3.Nausea 4. Post Neulasta pain: Encouraged her to take Tylenol or Motrin. 5. Monitoring closely for peripheral neuropathy which she hasn't developed.    Return to clinic in 3 weeks for cycle 6 of treatment. I sent an order for breast MRI to be done after 6 cycles of treatment. We will present her in the tumor board afterwards and will have her  see Dr. [name] to discuss surgical plan.    No orders of the defined types were placed in this encounter.  The patient has a good understanding of the overall plan. she agrees with it. she will call with any problems that may develop before the next visit here.  Total time spent: 30 mins including face to face time and time spent for planning, charting and coordination of care  Nicholas Lose, MD 10/05/2019  I, Cloyde Reams Dorshimer, am acting as scribe for Dr. Nicholas Lose.  I have reviewed the above documentation for accuracy and completeness, and I agree with the above.

## 2019-10-04 NOTE — Assessment & Plan Note (Signed)
06/21/2019:Patient palpated a painful right breast lump x3 weeks. Mammogram showed a 2.0cm right breast mass at the 10 o'clock position, a 3.6cm enlarged right axillary lymph node, and a 0.5cm left breast mass. Biopsy showed, in the right breast and axilla(lymph node), IDC, grade 3, HER-2 equivocal by IHC, + by FISH, ER+ 40%, PR - 0%, Ki67 80%,  left breast, fibrocystic changes, no malignancy.Can be followed.  Treatment plan: 1. Neoadjuvant chemotherapy with TCH Perjeta 6 cycles followed by HerceptinPerjeta versus Kadcylamaintenance for 1 year 2. Followed by breast conserving surgery if possible with sentinel lymph node study 3. Followed by adjuvant radiation therapy if patient had lumpectomy 4.Consideration for neratinib Genetic counseling Breast MRI on 07/07/19 showed the 2.9cm biopsy-proven malignancy in the right breast and axilla, highly suspicious non-mass enhancement in the lateral right breast spanning 14.5cm, and no left breast malignancy ----------------------------------------------------------------------------------------------------------------------------------------------------------- Current treatment: Cycle 5 day1TCH Perjeta.Chemotherapy started4/14/2021 Echocardiogram 07/12/2019 EF 60-65%  Chemo toxicities: 1.Diarrhea required IV fluids: Dose reduced with cycle 2 2.Fatigue 3.Nausea 4. Post Neulasta pain: sent in Percocet #15, and directed her to continue with heating pad, one tylenol and one aleve as  Much as TID PRN, and percocet for the week after her neulasta BID PRN.  She understands this.   5. Monitoring closely for peripheral neuropathy which she hasn't developed.    Return to clinic in 3 weeks for cycle 6 of treatment.

## 2019-10-05 ENCOUNTER — Encounter: Payer: Self-pay | Admitting: *Deleted

## 2019-10-05 ENCOUNTER — Inpatient Hospital Stay: Payer: BC Managed Care – PPO

## 2019-10-05 ENCOUNTER — Inpatient Hospital Stay: Payer: BC Managed Care – PPO | Attending: Hematology and Oncology

## 2019-10-05 ENCOUNTER — Other Ambulatory Visit: Payer: Self-pay | Admitting: *Deleted

## 2019-10-05 ENCOUNTER — Other Ambulatory Visit: Payer: Self-pay

## 2019-10-05 ENCOUNTER — Inpatient Hospital Stay (HOSPITAL_BASED_OUTPATIENT_CLINIC_OR_DEPARTMENT_OTHER): Payer: BC Managed Care – PPO | Admitting: Hematology and Oncology

## 2019-10-05 DIAGNOSIS — R197 Diarrhea, unspecified: Secondary | ICD-10-CM | POA: Insufficient documentation

## 2019-10-05 DIAGNOSIS — C50411 Malignant neoplasm of upper-outer quadrant of right female breast: Secondary | ICD-10-CM | POA: Insufficient documentation

## 2019-10-05 DIAGNOSIS — R5383 Other fatigue: Secondary | ICD-10-CM | POA: Diagnosis not present

## 2019-10-05 DIAGNOSIS — Z5111 Encounter for antineoplastic chemotherapy: Secondary | ICD-10-CM | POA: Insufficient documentation

## 2019-10-05 DIAGNOSIS — Z170 Estrogen receptor positive status [ER+]: Secondary | ICD-10-CM

## 2019-10-05 DIAGNOSIS — Z5181 Encounter for therapeutic drug level monitoring: Secondary | ICD-10-CM

## 2019-10-05 DIAGNOSIS — R110 Nausea: Secondary | ICD-10-CM | POA: Insufficient documentation

## 2019-10-05 DIAGNOSIS — Z79899 Other long term (current) drug therapy: Secondary | ICD-10-CM | POA: Insufficient documentation

## 2019-10-05 DIAGNOSIS — Z5189 Encounter for other specified aftercare: Secondary | ICD-10-CM | POA: Insufficient documentation

## 2019-10-05 DIAGNOSIS — Z95828 Presence of other vascular implants and grafts: Secondary | ICD-10-CM

## 2019-10-05 LAB — CMP (CANCER CENTER ONLY)
ALT: 13 U/L (ref 0–44)
AST: 16 U/L (ref 15–41)
Albumin: 3.1 g/dL — ABNORMAL LOW (ref 3.5–5.0)
Alkaline Phosphatase: 42 U/L (ref 38–126)
Anion gap: 12 (ref 5–15)
BUN: 8 mg/dL (ref 6–20)
CO2: 26 mmol/L (ref 22–32)
Calcium: 8.5 mg/dL — ABNORMAL LOW (ref 8.9–10.3)
Chloride: 105 mmol/L (ref 98–111)
Creatinine: 0.83 mg/dL (ref 0.44–1.00)
GFR, Est AFR Am: >60 mL/min (ref >60)
GFR, Estimated: >60 mL/min (ref >60)
Glucose, Bld: 102 mg/dL — ABNORMAL HIGH (ref 70–99)
Potassium: 3.5 mmol/L (ref 3.5–5.1)
Sodium: 143 mmol/L (ref 135–145)
Total Bilirubin: 0.3 mg/dL (ref 0.3–1.2)
Total Protein: 5.6 g/dL — ABNORMAL LOW (ref 6.5–8.1)

## 2019-10-05 LAB — CBC WITH DIFFERENTIAL (CANCER CENTER ONLY)
Abs Immature Granulocytes: 0.01 K/uL (ref 0.00–0.07)
Basophils Absolute: 0 K/uL (ref 0.0–0.1)
Basophils Relative: 1 %
Eosinophils Absolute: 0 K/uL (ref 0.0–0.5)
Eosinophils Relative: 1 %
HCT: 26.7 % — ABNORMAL LOW (ref 36.0–46.0)
Hemoglobin: 8.5 g/dL — ABNORMAL LOW (ref 12.0–15.0)
Immature Granulocytes: 0 %
Lymphocytes Relative: 39 %
Lymphs Abs: 2.2 K/uL (ref 0.7–4.0)
MCH: 34.4 pg — ABNORMAL HIGH (ref 26.0–34.0)
MCHC: 31.8 g/dL (ref 30.0–36.0)
MCV: 108.1 fL — ABNORMAL HIGH (ref 80.0–100.0)
Monocytes Absolute: 0.7 K/uL (ref 0.1–1.0)
Monocytes Relative: 11 %
Neutro Abs: 2.8 K/uL (ref 1.7–7.7)
Neutrophils Relative %: 48 %
Platelet Count: 268 K/uL (ref 150–400)
RBC: 2.47 MIL/uL — ABNORMAL LOW (ref 3.87–5.11)
RDW: 21.6 % — ABNORMAL HIGH (ref 11.5–15.5)
WBC Count: 5.7 K/uL (ref 4.0–10.5)
nRBC: 0 % (ref 0.0–0.2)

## 2019-10-05 LAB — PREGNANCY, URINE: Preg Test, Ur: NEGATIVE

## 2019-10-05 MED ORDER — SODIUM CHLORIDE 0.9 % IV SOLN
Freq: Once | INTRAVENOUS | Status: AC
  Filled 2019-10-05: qty 250

## 2019-10-05 MED ORDER — ACETAMINOPHEN 325 MG PO TABS
ORAL_TABLET | ORAL | Status: AC
  Filled 2019-10-05: qty 2

## 2019-10-05 MED ORDER — ACETAMINOPHEN 325 MG PO TABS
650.0000 mg | ORAL_TABLET | Freq: Once | ORAL | Status: AC
  Administered 2019-10-05: 650 mg via ORAL

## 2019-10-05 MED ORDER — TRASTUZUMAB-DKST CHEMO 150 MG IV SOLR
6.0000 mg/kg | Freq: Once | INTRAVENOUS | Status: AC
  Administered 2019-10-05: 672 mg via INTRAVENOUS
  Filled 2019-10-05: qty 32

## 2019-10-05 MED ORDER — SODIUM CHLORIDE 0.9% FLUSH
10.0000 mL | Freq: Once | INTRAVENOUS | Status: AC
  Administered 2019-10-05: 10 mL
  Filled 2019-10-05: qty 10

## 2019-10-05 MED ORDER — PALONOSETRON HCL INJECTION 0.25 MG/5ML
0.2500 mg | Freq: Once | INTRAVENOUS | Status: AC
  Administered 2019-10-05: 0.25 mg via INTRAVENOUS

## 2019-10-05 MED ORDER — PALONOSETRON HCL INJECTION 0.25 MG/5ML
INTRAVENOUS | Status: AC
  Filled 2019-10-05: qty 5

## 2019-10-05 MED ORDER — SODIUM CHLORIDE 0.9 % IV SOLN
420.0000 mg | Freq: Once | INTRAVENOUS | Status: AC
  Administered 2019-10-05: 420 mg via INTRAVENOUS
  Filled 2019-10-05: qty 14

## 2019-10-05 MED ORDER — SODIUM CHLORIDE 0.9 % IV SOLN
150.0000 mg | Freq: Once | INTRAVENOUS | Status: AC
  Administered 2019-10-05: 150 mg via INTRAVENOUS
  Filled 2019-10-05: qty 150

## 2019-10-05 MED ORDER — SODIUM CHLORIDE 0.9 % IV SOLN
65.0000 mg/m2 | Freq: Once | INTRAVENOUS | Status: AC
  Administered 2019-10-05: 150 mg via INTRAVENOUS
  Filled 2019-10-05: qty 15

## 2019-10-05 MED ORDER — DIPHENHYDRAMINE HCL 25 MG PO CAPS
25.0000 mg | ORAL_CAPSULE | Freq: Once | ORAL | Status: AC
  Administered 2019-10-05: 25 mg via ORAL

## 2019-10-05 MED ORDER — HEPARIN SOD (PORK) LOCK FLUSH 100 UNIT/ML IV SOLN
500.0000 [IU] | Freq: Once | INTRAVENOUS | Status: AC | PRN
  Administered 2019-10-05: 500 [IU]
  Filled 2019-10-05: qty 5

## 2019-10-05 MED ORDER — DIPHENHYDRAMINE HCL 25 MG PO CAPS
ORAL_CAPSULE | ORAL | Status: AC
  Filled 2019-10-05: qty 1

## 2019-10-05 MED ORDER — SODIUM CHLORIDE 0.9 % IV SOLN
10.0000 mg | Freq: Once | INTRAVENOUS | Status: AC
  Administered 2019-10-05: 10 mg via INTRAVENOUS
  Filled 2019-10-05: qty 10

## 2019-10-05 MED ORDER — SODIUM CHLORIDE 0.9 % IV SOLN
600.0000 mg | Freq: Once | INTRAVENOUS | Status: AC
  Administered 2019-10-05: 600 mg via INTRAVENOUS
  Filled 2019-10-05: qty 60

## 2019-10-05 MED ORDER — SODIUM CHLORIDE 0.9% FLUSH
10.0000 mL | INTRAVENOUS | Status: DC | PRN
  Administered 2019-10-05: 10 mL
  Filled 2019-10-05: qty 10

## 2019-10-05 NOTE — Patient Instructions (Signed)
[facility] Discharge Instructions for Patients Receiving Chemotherapy  Today you received the following chemotherapy agents: trastuzumab/pertuzumab/taxotere/carboplatin.  To help prevent nausea and vomiting after your treatment, we encourage you to take your nausea medication as directed.   If you develop nausea and vomiting that is not controlled by your nausea medication, call the clinic.   BELOW ARE SYMPTOMS THAT SHOULD BE REPORTED IMMEDIATELY:  *FEVER GREATER THAN 100.5 F  *CHILLS WITH OR WITHOUT FEVER  NAUSEA AND VOMITING THAT IS NOT CONTROLLED WITH YOUR NAUSEA MEDICATION  *UNUSUAL SHORTNESS OF BREATH  *UNUSUAL BRUISING OR BLEEDING  TENDERNESS IN MOUTH AND THROAT WITH OR WITHOUT PRESENCE OF ULCERS  *URINARY PROBLEMS  *BOWEL PROBLEMS  UNUSUAL RASH Items with * indicate a potential emergency and should be followed up as soon as possible.  Feel free to call the clinic should you have any questions or concerns. The clinic phone number is [phone].  Please show the [facility] at check-in to the Emergency Department and triage nurse.

## 2019-10-06 ENCOUNTER — Telehealth: Payer: Self-pay | Admitting: *Deleted

## 2019-10-06 NOTE — Telephone Encounter (Signed)
Spoke with patient to give her an appointment with Dr. [name] 8/2 at 215pm.  She verbalized understanding.

## 2019-10-07 ENCOUNTER — Other Ambulatory Visit: Payer: Self-pay

## 2019-10-07 ENCOUNTER — Inpatient Hospital Stay: Payer: BC Managed Care – PPO

## 2019-10-07 VITALS — BP 132/68 | HR 88 | Temp 97.9°F | Resp 18

## 2019-10-07 DIAGNOSIS — Z170 Estrogen receptor positive status [ER+]: Secondary | ICD-10-CM

## 2019-10-07 DIAGNOSIS — C50411 Malignant neoplasm of upper-outer quadrant of right female breast: Secondary | ICD-10-CM | POA: Diagnosis not present

## 2019-10-07 MED ORDER — PEGFILGRASTIM-JMDB 6 MG/0.6ML ~~LOC~~ SOSY
6.0000 mg | PREFILLED_SYRINGE | Freq: Once | SUBCUTANEOUS | Status: AC
  Administered 2019-10-07: 6 mg via SUBCUTANEOUS

## 2019-10-07 NOTE — Patient Instructions (Signed)

## 2019-10-09 ENCOUNTER — Telehealth: Payer: Self-pay | Admitting: Hematology and Oncology

## 2019-10-09 NOTE — Telephone Encounter (Signed)
Per 7/8 los, no changes made to pt schedule  

## 2019-10-12 ENCOUNTER — Inpatient Hospital Stay (HOSPITAL_COMMUNITY): Admission: RE | Admit: 2019-10-12 | Payer: BC Managed Care – PPO | Source: Ambulatory Visit

## 2019-10-17 ENCOUNTER — Ambulatory Visit: Payer: BC Managed Care – PPO | Admitting: Family Medicine

## 2019-10-17 ENCOUNTER — Encounter: Payer: Self-pay | Admitting: Family Medicine

## 2019-10-17 ENCOUNTER — Other Ambulatory Visit: Payer: Self-pay

## 2019-10-17 VITALS — BP 140/84 | HR 100 | Temp 97.7°F | Ht 64.0 in | Wt 240.6 lb

## 2019-10-17 DIAGNOSIS — F321 Major depressive disorder, single episode, moderate: Secondary | ICD-10-CM | POA: Diagnosis not present

## 2019-10-17 DIAGNOSIS — J4530 Mild persistent asthma, uncomplicated: Secondary | ICD-10-CM | POA: Diagnosis not present

## 2019-10-17 DIAGNOSIS — C50411 Malignant neoplasm of upper-outer quadrant of right female breast: Secondary | ICD-10-CM | POA: Diagnosis not present

## 2019-10-17 DIAGNOSIS — I10 Essential (primary) hypertension: Secondary | ICD-10-CM

## 2019-10-17 DIAGNOSIS — Z170 Estrogen receptor positive status [ER+]: Secondary | ICD-10-CM

## 2019-10-17 MED ORDER — FLUTICASONE PROPIONATE HFA 44 MCG/ACT IN AERO: 1.0000 | INHALATION_SPRAY | Freq: Two times a day (BID) | RESPIRATORY_TRACT | 5 refills | Status: DC

## 2019-10-17 NOTE — Progress Notes (Signed)
7/20/202110:23 AM  Heidi Costa December 22, 1980, 39 y.o., female [id number]  Chief Complaint  Patient presents with  . Hypertension    taing amlodipine and lisinopril, ahve no concerns with these meds  . Depression    medication working well per pt    HPI:   Patient is a 39 y.o. female with past medical history significant for HTN, depression, right breast cancer, asthma who presents [facility] for [facility]  Previous PCP Felton Clinton, NP Undergoing chemo for right breast cancer dx April 2021 Onc Dr Lindi Adie, last OV July 8th 2021 Sees surgeon on aug 8th and after surgery plans for radiation  She is overall doing well  She quit smoking ~ April 2021, denies any cravings Reports asthma well controlled on flovent  Aware of exercise component, sometimes wakes up at night Uses albuterol maybe 3 x week - wheezing and SOB Has not been very consistent with flovent use Denies any hosp 2/2 asthma Feels depression is well controlled on wellbutrin Has good support group re cancer dx KCL rx during chemo, denies previous issues with low K   BP Readings from Last 3 Encounters:  10/17/19 140/84  10/07/19 132/68  10/05/19 140/82    Lab Results  Component Value Date   CREATININE 0.83 10/05/2019   BUN 8 10/05/2019   NA 143 10/05/2019   K 3.5 10/05/2019   CL 105 10/05/2019   CO2 26 10/05/2019    Depression screen PHQ 2/9 10/17/2019 07/04/2019 06/23/2019  Decreased Interest 0 0 1  Down, Depressed, Hopeless 0 0 1  PHQ - 2 Score 0 0 2  Altered sleeping 0 0 1  Tired, decreased energy 1 1 1   Change in appetite 0 0 1  Feeling bad or failure about yourself  0 0 1  Trouble concentrating 0 0 1  Moving slowly or fidgety/restless 0 0 0  Suicidal thoughts 0 0 1  PHQ-9 Score 1 1 8   Difficult doing work/chores - Somewhat difficult Very difficult    Fall Risk  10/17/2019 09/01/2019 07/04/2019 06/23/2019 06/06/2019  Falls in the past year? 0 0 0 0 0  Number falls in past yr: 0 0 0 0 0  Injury with Fall? 0 0 0 0 0    Risk for fall due to : Medication side effect - - - -  Follow up - - Falls evaluation completed Falls evaluation completed Falls evaluation completed     Allergies  Allergen Reactions  . Aspirin Rash    Prior to Admission medications   Medication Sig Start Date End Date Taking? Authorizing Provider  acetaminophen (TYLENOL) 500 MG tablet Take 500-1,000 mg by mouth daily as needed for pain.    Yes [provider]  albuterol (VENTOLIN HFA) 108 (90 Base) MCG/ACT inhaler Inhale 2 puffs into the lungs every 6 (six) hours as needed for wheezing or shortness of breath. 09/01/19  Yes Rutherford Guys, MD  amLODipine (NORVASC) 5 MG tablet Take 1 tablet (5 mg total) by mouth daily. 09/01/19  Yes Rutherford Guys, MD  buPROPion (WELLBUTRIN XL) 300 MG 24 hr tablet Take 1 tablet (300 mg total) by mouth daily. 1 tab qam x 7 days then increase to 2 pills 09/01/19  Yes Rutherford Guys, MD  fluticasone (FLOVENT HFA) 44 MCG/ACT inhaler Inhale 1 puff into the lungs 2 (two) times daily as needed (asthma). 09/01/19  Yes Rutherford Guys, MD  ibuprofen (ADVIL) 200 MG tablet Take 200-400 mg by mouth every 6 (six) hours as  needed for mild pain.   Yes [provider]  lidocaine-prilocaine (EMLA) cream Apply to affected area once 07/05/19  Yes Nicholas Lose, MD  lisinopril (ZESTRIL) 20 MG tablet Take 1 tablet (20 mg total) by mouth daily. 09/01/19  Yes Rutherford Guys, MD  LORazepam (ATIVAN) 0.5 MG tablet Take 1 tablet (0.5 mg total) by mouth at bedtime as needed for sleep. 08/08/19  Yes Nicholas Lose, MD  ondansetron (ZOFRAN ODT) 8 MG disintegrating tablet Take 1 tablet (8 mg total) by mouth every 8 (eight) hours as needed for nausea or vomiting. 08/11/19  Yes Tanner, Lyndon Code., PA-C  ondansetron (ZOFRAN) 8 MG tablet Take 1 tablet (8 mg total) by mouth 2 (two) times daily as needed (Nausea or vomiting). Begin 4 days after chemotherapy. 07/05/19  Yes Nicholas Lose, MD  potassium chloride SA (KLOR-CON) 20 MEQ tablet  Take 1 tablet (20 mEq total) by mouth daily. 09/22/19  Yes Tanner, Lyndon Code., PA-C  prochlorperazine (COMPAZINE) 10 MG tablet Take 1 tablet (10 mg total) by mouth every 6 (six) hours as needed (Nausea or vomiting). 07/05/19  Yes Nicholas Lose, MD    Past Medical History:  Diagnosis Date  . Abnormality of gait 08/02/2012   resolved per patient on 07/12/19  . Anemia    iron taking in the past, not currently  . Anxiety   . Arthritis    spine- cerv.  & arm   . Asthma   . Bulging lumbar disc   . Cancer [facility])    Right Breast-starts chemo on 07/14/19  . Chronic arthralgias of knees and hips 08/02/2012  . Depression    Pt. took self off Celexa one month ago, states she did n't like how it made her feel .  Marland Kitchen Family history of cancer   . GERD (gastroesophageal reflux disease)    diet controlled  . Heart murmur    pt. been told that this is a fact, no echo or cardiac surveillance in the past   . HLD (hyperlipidemia)    no meds, diet controlled  . Hypertension   . Obesity   . Spinal stenosis     Past Surgical History:  Procedure Laterality Date  . ANTERIOR CERVICAL DECOMP/DISCECTOMY FUSION N/A 10/27/2012   Procedure: ANTERIOR CERVICAL DECOMPRESSION/DISCECTOMY FUSION 1 LEVEL;  Surgeon: Faythe Ghee, MD;  Location: [facility];  Service: Neurosurgery;  Laterality: N/A;  ANTERIOR CERVICAL DECOMPRESSION/DISCECTOMY FUSION 1 LEVEL  . CHALAZION EXCISION     L eye   . PORTACATH PLACEMENT Left 07/13/2019   Procedure: INSERTION PORT-A-CATH WITH ULTRASOUND;  Surgeon: Rolm Bookbinder, MD;  Location: [facility];  Service: General;  Laterality: Left;  Marland Kitchen VAGINAL DELIVERY  2002  . WISDOM TOOTH EXTRACTION      Social History   Tobacco Use  . Smoking status: Former Smoker    Packs/day: 1.00    Years: 10.00    Pack years: 10.00    Types: Cigarettes    Quit date: 06/25/2019    Years since quitting: 0.3  . Smokeless tobacco: Never Used  Substance Use Topics  . Alcohol use: Yes    Comment: occas     Family History  Problem Relation Age of Onset  . Osteoarthritis Mother   . Hypertension Mother   . Diabetes Mother   . Hypertension Father   . Gout Father   . Stroke Father 59       embolic  . Diabetes Half-Brother   . Hypertension Half-Brother   .  Heart failure Half-Brother 45  . Kidney disease Half-Brother   . Diabetes Paternal Grandfather   . Cancer Paternal Grandfather 60       breast cancer    Review of Systems  Constitutional: Positive for malaise/fatigue. Negative for chills and fever.  Respiratory: Positive for shortness of breath and wheezing. Negative for cough.   Cardiovascular: Negative for chest pain, palpitations and leg swelling.  Gastrointestinal: Positive for diarrhea, nausea and vomiting. Negative for abdominal pain, blood in stool and melena.  Genitourinary: Negative for dysuria and hematuria.  per hpi   OBJECTIVE:  Today's Vitals   10/17/19 1013  BP: 140/84  Pulse: 100  Temp: 97.7 F (36.5 C)  SpO2: 100%  Weight: 240 lb 9.6 oz (109.1 kg)  Height: 5\' 4"  (1.626 m)   Body mass index is 41.3 kg/m.   Physical Exam Vitals and nursing note reviewed.  Constitutional:      Appearance: She is well-developed.  HENT:     Head: Normocephalic and atraumatic.     Mouth/Throat:     Pharynx: No oropharyngeal exudate.  Eyes:     General: No scleral icterus.    Conjunctiva/sclera: Conjunctivae normal.     Pupils: Pupils are equal, round, and reactive to light.  Cardiovascular:     Rate and Rhythm: Normal rate and regular rhythm.     Heart sounds: Normal heart sounds. No murmur heard.  No friction rub. No gallop.   Pulmonary:     Effort: Pulmonary effort is normal.     Breath sounds: Normal breath sounds. No wheezing or rales.  Musculoskeletal:     Cervical back: Neck supple.     Right lower leg: No edema.     Left lower leg: No edema.  Skin:    General: Skin is warm and dry.  Neurological:     Mental Status: She is alert and oriented to  person, place, and time.     No results found for this or any previous visit (from the past 24 hour(s)).  No results found.   ASSESSMENT and PLAN  1. Essential hypertension Controlled. Continue current regime.   2. Depression, major, single episode, moderate (HCC) Controlled. Continue current regime.   3. Mild persistent asthma without complication Not adequately controlled, stressed importance of flovent compliance. RTC precautions given  4. Malignant neoplasm of upper-outer quadrant of right breast in female, estrogen receptor positive ([place]) Under active treatment, managed by onc  Other orders - fluticasone (FLOVENT HFA) 44 MCG/ACT inhaler; Inhale 1 puff into the lungs 2 (two) times daily.  Return in about 3 months (around 01/17/2020).    Rutherford Guys, MD Primary Care at [place], [place] [address] Ph.  [phone] Fax [phone]

## 2019-10-17 NOTE — Patient Instructions (Addendum)
    Work on more consistent use of flovent 1 puff twice a day   If you have lab work done today you will be contacted with your lab results within the next 2 weeks.  If you have not heard from [place] then please contact us. The fastest way to get your results is to register for My Chart.   IF you received an x-ray today, you will receive an invoice from [facility]. Please contact [facility] at [phone] with questions or concerns regarding your invoice.   IF you received labwork today, you will receive an invoice from [facility]. Please contact [facility] at [phone] with questions or concerns regarding your invoice.   Our billing staff will not be able to assist you with questions regarding bills from these companies.  You will be contacted with the lab results as soon as they are available. The fastest way to get your results is to activate your My Chart account. Instructions are located on the last page of this paperwork. If you have not heard from [place] regarding the results in 2 weeks, please contact this office.

## 2019-10-24 ENCOUNTER — Telehealth: Payer: Self-pay | Admitting: *Deleted

## 2019-10-24 NOTE — Telephone Encounter (Signed)
Received call from pt stating her husband was diagnosed with Covid on 10/22/19 and her son has a pending Covid test.  Pt requesting MD advice if upcoming apts need to be re scheduled.  Per MD as long as pt is quarantined from her husband and son and remains asymptomatic, okay to receive treatment this week.  Pt educated to call the office if any symptoms develop.  Pt verbalized understanding.

## 2019-10-25 MED FILL — Dexamethasone Sodium Phosphate Inj 100 MG/10ML: INTRAMUSCULAR | Qty: 1 | Status: AC

## 2019-10-25 MED FILL — Fosaprepitant Dimeglumine For IV Infusion 150 MG (Base Eq): INTRAVENOUS | Qty: 5 | Status: AC

## 2019-10-26 ENCOUNTER — Inpatient Hospital Stay: Payer: BC Managed Care – PPO | Admitting: Hematology and Oncology

## 2019-10-26 ENCOUNTER — Inpatient Hospital Stay: Payer: BC Managed Care – PPO

## 2019-10-26 ENCOUNTER — Encounter: Payer: Self-pay | Admitting: *Deleted

## 2019-10-26 NOTE — Assessment & Plan Note (Deleted)
06/21/2019:Patient palpated a painful right breast lump x3 weeks. Mammogram showed a 2.0cm right breast mass at the 10 o'clock position, a 3.6cm enlarged right axillary lymph node, and a 0.5cm left breast mass. Biopsy showed, in the right breast and axilla(lymph node), IDC, grade 3, HER-2 equivocal by IHC, + by FISH, ER+ 40%, PR - 0%, Ki67 80%,  left breast, fibrocystic changes, no malignancy.Can be followed.  Treatment plan: 1. Neoadjuvant chemotherapy with TCH Perjeta 6 cycles followed by HerceptinPerjeta versus Kadcylamaintenance for 1 year 2. Followed by breast conserving surgery if possible with sentinel lymph node study 3. Followed by adjuvant radiation therapy if patient had lumpectomy 4.Consideration for neratinib Genetic counseling Breast MRI on 07/07/19 showed the 2.9cm biopsy-proven malignancy in the right breast and axilla, highly suspicious non-mass enhancement in the lateral right breast spanning 14.5cm, and no left breast malignancy ----------------------------------------------------------------------------------------------------------------------------------------------------------- Current treatment: Cycle5day1TCH Perjeta.Chemotherapy started4/14/2021 Echocardiogram 07/12/2019 EF 60-65%  Chemo toxicities: 1.Diarrhea required IV fluids: Dose reduced with cycle 2 2.Fatigue 3.Nausea 4. Post Neulasta pain: Encouraged her to take Tylenol or Motrin. 5. Monitoring closely for peripheral neuropathy which she hasn't developed.  Return to clinic in 3 weeks for cycle 6 of treatment. Breast MRI has been scheduled for 10/27/2019 We will present her in the tumor board afterwards and will have her see Dr. Wakefield to discuss surgical plan.  

## 2019-10-27 ENCOUNTER — Other Ambulatory Visit: Payer: BC Managed Care – PPO

## 2019-10-28 ENCOUNTER — Inpatient Hospital Stay: Payer: BC Managed Care – PPO

## 2019-10-31 ENCOUNTER — Encounter: Payer: Self-pay | Admitting: *Deleted

## 2019-10-31 ENCOUNTER — Telehealth: Payer: Self-pay | Admitting: *Deleted

## 2019-10-31 NOTE — Telephone Encounter (Signed)
Spoke to patient to check in concerning her family with COVID and rescheduling of appts.  She tells me she also tested +for COVID along with her son and husband.  Per policy active chemo patients need to wait 21 days after the COVID diagnosis to continue w/tx.  Per Dr. Lindi Adie pt will omit cycle 6 TCH/P and proceed with H/P only around 8/23 (orders placed). Also sent message to have MRI rescheduled to 8/24.   Explained all this to patient and she verbalized understanding.

## 2019-11-01 ENCOUNTER — Telehealth: Payer: Self-pay | Admitting: Hematology and Oncology

## 2019-11-01 NOTE — Telephone Encounter (Signed)
Scheduled appt per 8/3 sch msg - pt is aware of appt date and time

## 2019-11-02 ENCOUNTER — Encounter: Payer: Self-pay | Admitting: *Deleted

## 2019-11-18 ENCOUNTER — Other Ambulatory Visit: Payer: Self-pay | Admitting: Oncology

## 2019-11-19 NOTE — Progress Notes (Addendum)
Patient Care Team: Rutherford Guys, MD as PCP - General (Family Medicine) Rockwell Germany, RN as Oncology Nurse Navigator Mauro Kaufmann, RN as Oncology Nurse Navigator Rolm Bookbinder, MD as Consulting Physician (General Surgery) Nicholas Lose, MD as Consulting Physician (Hematology and Oncology) Kyung Rudd, MD as Consulting Physician (Radiation Oncology)  DIAGNOSIS:    ICD-10-CM   1. Malignant neoplasm of upper-outer quadrant of right breast in female, estrogen receptor positive ([place])  C50.411    Z17.0     SUMMARY OF ONCOLOGIC HISTORY: Oncology History  Malignant neoplasm of upper-outer quadrant of right breast in female, estrogen receptor positive ([place])  06/21/2019 Initial Diagnosis   Patient palpated a painful right breast lump x3 weeks. Mammogram showed a 2.0cm right breast mass at the 10 o'clock position, a 3.6cm enlarged right axillary lymph node, and a 0.5cm left breast mass. Biopsy showed, in the right breast and axilla, IDC, grade 3, HER-2 equivocal by IHC, + by FISH, ER+ 40%, PR - 0%, Ki67 80%, and in the left breast, fibrocystic changes, no malignancy.    07/04/2019 Cancer Staging   Staging form: Breast, AJCC 8th Edition - Clinical stage from 07/04/2019: Stage IIB (cT2, cN1(f), cM0, G3, ER+, PR-, HER2+) - Signed by Nicholas Lose, MD on 07/05/2019   07/14/2019 -  Chemotherapy   The patient had dexamethasone (DECADRON) 4 MG tablet, 4 mg (100 % of original dose 4 mg), Oral, Daily, 1 of 1 cycle, Start date: 07/05/2019, End date: 08/04/2019 Dose modification: 4 mg (original dose 4 mg, Cycle 0) palonosetron (ALOXI) injection 0.25 mg, 0.25 mg, Intravenous,  Once, 5 of 5 cycles Administration: 0.25 mg (07/14/2019), 0.25 mg (08/04/2019), 0.25 mg (10/05/2019), 0.25 mg (08/25/2019), 0.25 mg (09/14/2019) pegfilgrastim-jmdb (FULPHILA) injection 6 mg, 6 mg, Subcutaneous,  Once, 5 of 5 cycles Administration: 6 mg (07/17/2019), 6 mg (08/07/2019), 6 mg (10/07/2019), 6 mg (08/29/2019), 6 mg  (09/16/2019) CARBOplatin (PARAPLATIN) 700 mg in sodium chloride 0.9 % 250 mL chemo infusion, 700 mg (100 % of original dose 700 mg), Intravenous,  Once, 5 of 5 cycles Dose modification: 700 mg (original dose 700 mg, Cycle 1, Reason: Provider Judgment), 600 mg (original dose 700 mg, Cycle 2, Reason: Dose not tolerated) Administration: 700 mg (07/14/2019), 600 mg (08/04/2019), 600 mg (10/05/2019), 600 mg (08/25/2019), 600 mg (09/14/2019) DOCEtaxel (TAXOTERE) 170 mg in sodium chloride 0.9 % 250 mL chemo infusion, 75 mg/m2 = 170 mg, Intravenous,  Once, 5 of 5 cycles Dose modification: 65 mg/m2 (original dose 75 mg/m2, Cycle 2, Reason: Dose not tolerated) Administration: 170 mg (07/14/2019), 150 mg (08/04/2019), 150 mg (10/05/2019), 150 mg (08/25/2019), 150 mg (09/14/2019) fosaprepitant (EMEND) 150 mg in sodium chloride 0.9 % 145 mL IVPB, 150 mg, Intravenous,  Once, 5 of 5 cycles Administration: 150 mg (07/14/2019), 150 mg (08/04/2019), 150 mg (10/05/2019), 150 mg (08/25/2019), 150 mg (09/14/2019) pertuzumab (PERJETA) 840 mg in sodium chloride 0.9 % 250 mL chemo infusion, 840 mg, Intravenous, Once, 5 of 5 cycles Administration: 840 mg (07/14/2019), 420 mg (08/04/2019), 420 mg (10/05/2019), 420 mg (08/25/2019), 420 mg (09/14/2019) trastuzumab-dkst (OGIVRI) 900 mg in sodium chloride 0.9 % 250 mL chemo infusion, 903 mg, Intravenous,  Once, 5 of 5 cycles Administration: 900 mg (07/14/2019), 672 mg (08/04/2019), 672 mg (10/05/2019), 672 mg (08/25/2019), 672 mg (09/14/2019)  for chemotherapy treatment.    07/22/2019 Genetic Testing   Negative genetic testing:  No pathogenic variants detected on the [facility] Common Hereditary Cancers Panel. The report date is 07/22/2019.  The Common Hereditary  Cancers Panel offered by [facility] includes sequencing and/or deletion duplication testing of the following 48 genes: APC, ATM, AXIN2, BARD1, BMPR1A, BRCA1, BRCA2, BRIP1, CDH1, CDK4, CDKN2A (p14ARF), CDKN2A (p16INK4a), CHEK2, CTNNA1, DICER1, EPCAM  (Deletion/duplication testing only), GREM1 (promoter region deletion/duplication testing only), KIT, MEN1, MLH1, MSH2, MSH3, MSH6, MUTYH, NBN, NF1, NHTL1, PALB2, PDGFRA, PMS2, POLD1, POLE, PTEN, RAD50, RAD51C, RAD51D, RNF43, SDHB, SDHC, SDHD, SMAD4, SMARCA4. STK11, TP53, TSC1, TSC2, and VHL.  The following genes were evaluated for sequence changes only: SDHA and HOXB13 c.251G>A variant only.      CHIEF COMPLIANT: Herceptin Perjeta maintenance  INTERVAL HISTORY: Heidi Costa is a 39 y.o. with above-mentioned history of HER-2 positive breast cancer who completed neoadjuvant chemotherapyand is currently on Herceptin Perjeta maintenance.She tested positive for COVID-19 and treatment has been delayed as a result. She presents to the clinic todayfor treatment. She has been tolerating her treatment extremely well without any major side effects. She does not complain of neuropathy. She has an appointment for day after tomorrow for breast MRI and soon after follow-up with the surgeon.  ALLERGIES:  is allergic to aspirin.  MEDICATIONS:  Current Outpatient Medications  Medication Sig Dispense Refill   acetaminophen (TYLENOL) 500 MG tablet Take 500-1,000 mg by mouth daily as needed for pain.      albuterol (VENTOLIN HFA) 108 (90 Base) MCG/ACT inhaler Inhale 2 puffs into the lungs every 6 (six) hours as needed for wheezing or shortness of breath. 18 g 2   amLODipine (NORVASC) 5 MG tablet Take 1 tablet (5 mg total) by mouth daily. 90 tablet 1   buPROPion (WELLBUTRIN XL) 300 MG 24 hr tablet Take 1 tablet (300 mg total) by mouth daily. 1 tab qam x 7 days then increase to 2 pills 90 tablet 1   fluticasone (FLOVENT HFA) 44 MCG/ACT inhaler Inhale 1 puff into the lungs 2 (two) times daily. 1 Inhaler 5   ibuprofen (ADVIL) 200 MG tablet Take 200-400 mg by mouth every 6 (six) hours as needed for mild pain.     lidocaine-prilocaine (EMLA) cream Apply to affected area once 30 g 3   lisinopril (ZESTRIL) 20  MG tablet Take 1 tablet (20 mg total) by mouth daily. 90 tablet 1   LORazepam (ATIVAN) 0.5 MG tablet Take 1 tablet (0.5 mg total) by mouth at bedtime as needed for sleep. 30 tablet 3   ondansetron (ZOFRAN ODT) 8 MG disintegrating tablet Take 1 tablet (8 mg total) by mouth every 8 (eight) hours as needed for nausea or vomiting. 20 tablet 0   ondansetron (ZOFRAN) 8 MG tablet Take 1 tablet (8 mg total) by mouth 2 (two) times daily as needed (Nausea or vomiting). Begin 4 days after chemotherapy. 30 tablet 1   potassium chloride SA (KLOR-CON) 20 MEQ tablet Take 1 tablet (20 mEq total) by mouth daily. 10 tablet 0   prochlorperazine (COMPAZINE) 10 MG tablet Take 1 tablet (10 mg total) by mouth every 6 (six) hours as needed (Nausea or vomiting). 30 tablet 1   No current facility-administered medications for this visit.    PHYSICAL EXAMINATION: ECOG PERFORMANCE STATUS: 1 - Symptomatic but completely ambulatory  Vitals:   11/20/19 1058  BP: (!) 137/99  Pulse: 86  Resp: 18  Temp: 98.3 F (36.8 C)  SpO2: 100%   Filed Weights   11/20/19 1058  Weight: 232 lb 11.2 oz (105.6 kg)    LABORATORY DATA:  I have reviewed the data as listed CMP Latest Ref Rng & Units  11/20/2019 10/05/2019 09/22/2019  Glucose 70 - 99 mg/dL 101(H) 102(H) 124(H)  BUN 6 - 20 mg/dL _0 Creatinine 0.44 - 1.00 mg/dL 0.88 0.83 1.18(H)  Sodium 135 - 145 mmol/L 141 143 140  Potassium 3.5 - 5.1 mmol/L 3.8 3.5 3.2(L)  Chloride 98 - 111 mmol/L 108 105 102  CO2 22 - 32 mmol/L _1 Calcium 8.9 - 10.3 mg/dL 9.5 8.5(L) 8.9  Total Protein 6.5 - 8.1 g/dL 6.6 5.6(L) 6.3(L)  Total Bilirubin 0.3 - 1.2 mg/dL 0.4 0.3 0.3  Alkaline Phos 38 - 126 U/L 59 42 59  AST 15 - 41 U/L _2 ALT 0 - 44 U/L _3 Lab Results  Component Value Date   WBC 4.6 11/20/2019   HGB 9.7 (L) 11/20/2019   HCT 29.4 (L) 11/20/2019   MCV 105.8 (H) 11/20/2019   PLT 206 11/20/2019   NEUTROABS 2.0 11/20/2019    ASSESSMENT & PLAN:   Malignant neoplasm of upper-outer quadrant of right breast in female, estrogen receptor positive ([place]) 06/21/2019:Patient palpated a painful right breast lump x3 weeks. Mammogram showed a 2.0cm right breast mass at the 10 o'clock position, a 3.6cm enlarged right axillary lymph node, and a 0.5cm left breast mass. Biopsy showed, in the right breast and axilla(lymph node), IDC, grade 3, HER-2 equivocal by IHC, + by FISH, ER+ 40%, PR - 0%, Ki67 80%,  left breast, fibrocystic changes, no malignancy.Can be followed.  Treatment plan: 1. Neoadjuvant chemotherapy with TCH Perjeta 6 cycles followed by HerceptinPerjeta versus Kadcylamaintenance for 1 year 2. Followed by breast conserving surgery if possible with sentinel lymph node study 3. Followed by adjuvant radiation therapy if patient had lumpectomy 4.Consideration for neratinib Genetic counseling Breast MRI on 07/07/19 showed the 2.9cm biopsy-proven malignancy in the right breast and axilla, highly suspicious non-mass enhancement in the lateral right breast spanning 14.5cm, and no left breast malignancy ----------------------------------------------------------------------------------------------------------------------------------------------------------- Current treatment: Completed 6 cycles ofTCH Perjeta.Chemotherapy started4/14/2021 today is Herceptin and Perjeta maintenance Echocardiogram 07/12/2019 EF 60-65%     Breast MRI scheduled for 11/22/2019 We will present her in the tumor board afterwards and will have her see Dr. [name] to discuss surgical plan.  I discussed with her briefly that post surgery treatment plan could differ based on the response to treatment. If she has a complete response then she will 1 Herceptin Perjeta maintenance. If she has a partial response then she will go on a Kadcyla maintenance and also consider clinical trial participation [employer] HER 2 trial which randomizes patients between Kadcyla versus  Kadcyla plus Tucatinib.   No orders of the defined types were placed in this encounter.  The patient has a good understanding of the overall plan. she agrees with it. she will call with any problems that may develop before the next visit here.  Total time spent: 30 mins including face to face time and time spent for planning, charting and coordination of care  Nicholas Lose, MD 11/20/2019  I, Cloyde Reams Dorshimer, am acting as scribe for Dr. Nicholas Lose.  I have reviewed the above documentation for accuracy and completeness, and I agree with the above.

## 2019-11-20 ENCOUNTER — Inpatient Hospital Stay: Payer: BC Managed Care – PPO

## 2019-11-20 ENCOUNTER — Encounter: Payer: Self-pay | Admitting: *Deleted

## 2019-11-20 ENCOUNTER — Inpatient Hospital Stay (HOSPITAL_BASED_OUTPATIENT_CLINIC_OR_DEPARTMENT_OTHER): Payer: BC Managed Care – PPO | Admitting: Hematology and Oncology

## 2019-11-20 ENCOUNTER — Other Ambulatory Visit: Payer: Self-pay

## 2019-11-20 ENCOUNTER — Inpatient Hospital Stay: Payer: BC Managed Care – PPO | Attending: Hematology and Oncology

## 2019-11-20 VITALS — BP 137/99 | HR 86 | Temp 98.3°F | Resp 18 | Ht 64.0 in | Wt 232.7 lb

## 2019-11-20 VITALS — BP 131/71 | HR 64 | Resp 18

## 2019-11-20 DIAGNOSIS — Z170 Estrogen receptor positive status [ER+]: Secondary | ICD-10-CM | POA: Insufficient documentation

## 2019-11-20 DIAGNOSIS — C50411 Malignant neoplasm of upper-outer quadrant of right female breast: Secondary | ICD-10-CM

## 2019-11-20 DIAGNOSIS — U071 COVID-19: Secondary | ICD-10-CM | POA: Diagnosis not present

## 2019-11-20 DIAGNOSIS — G629 Polyneuropathy, unspecified: Secondary | ICD-10-CM | POA: Insufficient documentation

## 2019-11-20 DIAGNOSIS — Z95828 Presence of other vascular implants and grafts: Secondary | ICD-10-CM

## 2019-11-20 DIAGNOSIS — Z5111 Encounter for antineoplastic chemotherapy: Secondary | ICD-10-CM | POA: Insufficient documentation

## 2019-11-20 DIAGNOSIS — Z79899 Other long term (current) drug therapy: Secondary | ICD-10-CM | POA: Insufficient documentation

## 2019-11-20 LAB — CBC WITH DIFFERENTIAL (CANCER CENTER ONLY)
Abs Immature Granulocytes: 0.01 10*3/uL (ref 0.00–0.07)
Basophils Absolute: 0 10*3/uL (ref 0.0–0.1)
Basophils Relative: 0 %
Eosinophils Absolute: 0.3 10*3/uL (ref 0.0–0.5)
Eosinophils Relative: 6 %
HCT: 29.4 % — ABNORMAL LOW (ref 36.0–46.0)
Hemoglobin: 9.7 g/dL — ABNORMAL LOW (ref 12.0–15.0)
Immature Granulocytes: 0 %
Lymphocytes Relative: 43 %
Lymphs Abs: 2 10*3/uL (ref 0.7–4.0)
MCH: 34.9 pg — ABNORMAL HIGH (ref 26.0–34.0)
MCHC: 33 g/dL (ref 30.0–36.0)
MCV: 105.8 fL — ABNORMAL HIGH (ref 80.0–100.0)
Monocytes Absolute: 0.3 10*3/uL (ref 0.1–1.0)
Monocytes Relative: 7 %
Neutro Abs: 2 10*3/uL (ref 1.7–7.7)
Neutrophils Relative %: 44 %
Platelet Count: 206 10*3/uL (ref 150–400)
RBC: 2.78 MIL/uL — ABNORMAL LOW (ref 3.87–5.11)
RDW: 14.7 % (ref 11.5–15.5)
WBC Count: 4.6 10*3/uL (ref 4.0–10.5)
nRBC: 0 % (ref 0.0–0.2)

## 2019-11-20 LAB — PREGNANCY, URINE: Preg Test, Ur: NEGATIVE

## 2019-11-20 LAB — CMP (CANCER CENTER ONLY)
ALT: 23 U/L (ref 0–44)
AST: 18 U/L (ref 15–41)
Albumin: 3.4 g/dL — ABNORMAL LOW (ref 3.5–5.0)
Alkaline Phosphatase: 59 U/L (ref 38–126)
Anion gap: 8 (ref 5–15)
BUN: 10 mg/dL (ref 6–20)
CO2: 25 mmol/L (ref 22–32)
Calcium: 9.5 mg/dL (ref 8.9–10.3)
Chloride: 108 mmol/L (ref 98–111)
Creatinine: 0.88 mg/dL (ref 0.44–1.00)
GFR, Est AFR Am: >60 mL/min (ref >60)
GFR, Estimated: >60 mL/min (ref >60)
Glucose, Bld: 101 mg/dL — ABNORMAL HIGH (ref 70–99)
Potassium: 3.8 mmol/L (ref 3.5–5.1)
Sodium: 141 mmol/L (ref 135–145)
Total Bilirubin: 0.4 mg/dL (ref 0.3–1.2)
Total Protein: 6.6 g/dL (ref 6.5–8.1)

## 2019-11-20 MED ORDER — ACETAMINOPHEN 325 MG PO TABS
650.0000 mg | ORAL_TABLET | Freq: Once | ORAL | Status: AC
  Administered 2019-11-20: 650 mg via ORAL

## 2019-11-20 MED ORDER — ACETAMINOPHEN 325 MG PO TABS
ORAL_TABLET | ORAL | Status: AC
  Filled 2019-11-20: qty 2

## 2019-11-20 MED ORDER — SODIUM CHLORIDE 0.9% FLUSH
10.0000 mL | INTRAVENOUS | Status: DC | PRN
  Administered 2019-11-20: 10 mL
  Filled 2019-11-20: qty 10

## 2019-11-20 MED ORDER — TRASTUZUMAB-DKST CHEMO 150 MG IV SOLR
6.0000 mg/kg | Freq: Once | INTRAVENOUS | Status: AC
  Administered 2019-11-20: 630 mg via INTRAVENOUS
  Filled 2019-11-20: qty 30

## 2019-11-20 MED ORDER — DIPHENHYDRAMINE HCL 25 MG PO CAPS
50.0000 mg | ORAL_CAPSULE | Freq: Once | ORAL | Status: AC
  Administered 2019-11-20: 50 mg via ORAL

## 2019-11-20 MED ORDER — SODIUM CHLORIDE 0.9 % IV SOLN
420.0000 mg | Freq: Once | INTRAVENOUS | Status: AC
  Administered 2019-11-20: 420 mg via INTRAVENOUS
  Filled 2019-11-20: qty 14

## 2019-11-20 MED ORDER — SODIUM CHLORIDE 0.9% FLUSH
10.0000 mL | Freq: Once | INTRAVENOUS | Status: AC
  Administered 2019-11-20: 10 mL
  Filled 2019-11-20: qty 10

## 2019-11-20 MED ORDER — HEPARIN SOD (PORK) LOCK FLUSH 100 UNIT/ML IV SOLN
500.0000 [IU] | Freq: Once | INTRAVENOUS | Status: AC | PRN
  Administered 2019-11-20: 500 [IU]
  Filled 2019-11-20: qty 5

## 2019-11-20 MED ORDER — SODIUM CHLORIDE 0.9 % IV SOLN
Freq: Once | INTRAVENOUS | Status: AC
  Filled 2019-11-20: qty 250

## 2019-11-20 MED ORDER — DIPHENHYDRAMINE HCL 25 MG PO CAPS
ORAL_CAPSULE | ORAL | Status: AC
  Filled 2019-11-20: qty 2

## 2019-11-20 NOTE — Progress Notes (Signed)
No re-loading w/ Her/Perj per MD order.  Kennith Center, Pharm.D., CPP 11/20/2019@12 :09 PM

## 2019-11-20 NOTE — Addendum Note (Signed)
Addended by: Nicholas Lose on: 11/20/2019 11:57 AM   Modules accepted: Orders

## 2019-11-20 NOTE — Progress Notes (Signed)
Patient did not wish to stay for their 30-minute observation period. Patient educated to call the office or 911 if any issues arise. Patient stated that she feels "fine" and has had no issues with the medication.  Vital signs retaken and remain stable. Patient left with no signs of distress.

## 2019-11-20 NOTE — Patient Instructions (Signed)
[facility] Discharge Instructions for Patients Receiving Chemotherapy  Today you received the following chemotherapy agents: Trastuzumab and Perjeta  To help prevent nausea and vomiting after your treatment, we encourage you to take your nausea medication as prescribed.   If you develop nausea and vomiting that is not controlled by your nausea medication, call the clinic.   BELOW ARE SYMPTOMS THAT SHOULD BE REPORTED IMMEDIATELY:  *FEVER GREATER THAN 100.5 F  *CHILLS WITH OR WITHOUT FEVER  NAUSEA AND VOMITING THAT IS NOT CONTROLLED WITH YOUR NAUSEA MEDICATION  *UNUSUAL SHORTNESS OF BREATH  *UNUSUAL BRUISING OR BLEEDING  TENDERNESS IN MOUTH AND THROAT WITH OR WITHOUT PRESENCE OF ULCERS  *URINARY PROBLEMS  *BOWEL PROBLEMS  UNUSUAL RASH Items with * indicate a potential emergency and should be followed up as soon as possible.  Feel free to call the clinic should you have any questions or concerns. The clinic phone number is [phone].  Please show the [facility] at check-in to the Emergency Department and triage nurse.

## 2019-11-20 NOTE — Assessment & Plan Note (Signed)
06/21/2019:Patient palpated a painful right breast lump x3 weeks. Mammogram showed a 2.0cm right breast mass at the 10 o'clock position, a 3.6cm enlarged right axillary lymph node, and a 0.5cm left breast mass. Biopsy showed, in the right breast and axilla(lymph node), IDC, grade 3, HER-2 equivocal by IHC, + by FISH, ER+ 40%, PR - 0%, Ki67 80%,  left breast, fibrocystic changes, no malignancy.Can be followed.  Treatment plan: 1. Neoadjuvant chemotherapy with TCH Perjeta 6 cycles followed by HerceptinPerjeta versus Kadcylamaintenance for 1 year 2. Followed by breast conserving surgery if possible with sentinel lymph node study 3. Followed by adjuvant radiation therapy if patient had lumpectomy 4.Consideration for neratinib Genetic counseling Breast MRI on 07/07/19 showed the 2.9cm biopsy-proven malignancy in the right breast and axilla, highly suspicious non-mass enhancement in the lateral right breast spanning 14.5cm, and no left breast malignancy ----------------------------------------------------------------------------------------------------------------------------------------------------------- Current treatment: [place].Chemotherapy started4/14/2021 Echocardiogram 07/12/2019 EF 60-65%  Chemo toxicities: 1.Diarrhea required IV fluids: Dose reduced with cycle 2 2.Fatigue 3.Nausea 4. Post Neulasta pain: Encouraged her to take Tylenol or Motrin. 5. Monitoring closely for peripheral neuropathy which she hasn't developed.  Breast MRI scheduled for 11/22/2019 We will present her in the tumor board afterwards and will have her see Dr. [name] to discuss surgical plan.

## 2019-11-21 ENCOUNTER — Telehealth: Payer: Self-pay | Admitting: Hematology and Oncology

## 2019-11-21 ENCOUNTER — Ambulatory Visit (HOSPITAL_COMMUNITY)
Admission: RE | Admit: 2019-11-21 | Discharge: 2019-11-21 | Disposition: A | Discharge status: Home / Self Care | Payer: BC Managed Care – PPO | Source: Ambulatory Visit | Attending: Hematology and Oncology | Admitting: Hematology and Oncology

## 2019-11-21 DIAGNOSIS — R011 Cardiac murmur, unspecified: Secondary | ICD-10-CM | POA: Insufficient documentation

## 2019-11-21 DIAGNOSIS — E785 Hyperlipidemia, unspecified: Secondary | ICD-10-CM | POA: Insufficient documentation

## 2019-11-21 DIAGNOSIS — Z5181 Encounter for therapeutic drug level monitoring: Secondary | ICD-10-CM | POA: Diagnosis not present

## 2019-11-21 DIAGNOSIS — Z79899 Other long term (current) drug therapy: Secondary | ICD-10-CM | POA: Insufficient documentation

## 2019-11-21 DIAGNOSIS — Z170 Estrogen receptor positive status [ER+]: Secondary | ICD-10-CM | POA: Insufficient documentation

## 2019-11-21 DIAGNOSIS — C50411 Malignant neoplasm of upper-outer quadrant of right female breast: Secondary | ICD-10-CM | POA: Insufficient documentation

## 2019-11-21 DIAGNOSIS — K219 Gastro-esophageal reflux disease without esophagitis: Secondary | ICD-10-CM | POA: Insufficient documentation

## 2019-11-21 DIAGNOSIS — I10 Essential (primary) hypertension: Secondary | ICD-10-CM | POA: Insufficient documentation

## 2019-11-21 LAB — ECHOCARDIOGRAM COMPLETE
Area-P 1/2: 2.09 cm2
S' Lateral: 3.5 cm

## 2019-11-21 NOTE — Progress Notes (Signed)
  Echocardiogram 2D Echocardiogram has been performed.  Heidi Costa Heidi Costa 11/21/2019, 9:03 AM

## 2019-11-21 NOTE — Telephone Encounter (Signed)
Added office visit between appts that were already on pt's schedule per 8/23 los. Made no changes to pt's arrival time.

## 2019-11-22 ENCOUNTER — Other Ambulatory Visit: Payer: Self-pay

## 2019-11-22 ENCOUNTER — Ambulatory Visit
Admission: RE | Admit: 2019-11-22 | Discharge: 2019-11-22 | Disposition: A | Discharge status: Home / Self Care | Payer: BC Managed Care – PPO | Source: Ambulatory Visit | Attending: Hematology and Oncology | Admitting: Hematology and Oncology

## 2019-11-22 DIAGNOSIS — Z170 Estrogen receptor positive status [ER+]: Secondary | ICD-10-CM

## 2019-11-22 MED ORDER — GADOBUTROL 1 MMOL/ML IV SOLN
10.0000 mL | Freq: Once | INTRAVENOUS | Status: AC | PRN
  Administered 2019-11-22: 10 mL via INTRAVENOUS

## 2019-11-23 ENCOUNTER — Encounter: Payer: Self-pay | Admitting: *Deleted

## 2019-11-24 ENCOUNTER — Other Ambulatory Visit: Payer: Self-pay | Admitting: General Surgery

## 2019-11-24 DIAGNOSIS — Z170 Estrogen receptor positive status [ER+]: Secondary | ICD-10-CM

## 2019-11-27 ENCOUNTER — Encounter: Payer: Self-pay | Admitting: *Deleted

## 2019-11-30 ENCOUNTER — Encounter: Payer: Self-pay | Admitting: *Deleted

## 2019-12-06 ENCOUNTER — Other Ambulatory Visit: Payer: Self-pay

## 2019-12-06 ENCOUNTER — Encounter (HOSPITAL_BASED_OUTPATIENT_CLINIC_OR_DEPARTMENT_OTHER): Payer: Self-pay | Admitting: General Surgery

## 2019-12-08 ENCOUNTER — Other Ambulatory Visit: Payer: Self-pay | Admitting: *Deleted

## 2019-12-08 DIAGNOSIS — Z170 Estrogen receptor positive status [ER+]: Secondary | ICD-10-CM

## 2019-12-09 ENCOUNTER — Emergency Department (HOSPITAL_COMMUNITY)
Admission: EM | Admit: 2019-12-09 | Discharge: 2019-12-09 | Discharge status: Absent without leave | Payer: BC Managed Care – PPO

## 2019-12-09 ENCOUNTER — Inpatient Hospital Stay (HOSPITAL_COMMUNITY)
Admission: RE | Admit: 2019-12-09 | Discharge: 2019-12-09 | Disposition: A | Discharge status: Home / Self Care | Payer: BC Managed Care – PPO | Source: Ambulatory Visit

## 2019-12-09 ENCOUNTER — Other Ambulatory Visit: Payer: Self-pay

## 2019-12-09 NOTE — Progress Notes (Signed)
Pt not tested for covid today 12/09/19 due to pt testing + for covid on 10/27/19 via [facility]. Pt will bring documentation on the day of the procedure. Based on the guidelines the pt is in the 90 day window to not retest. The pt is still expected to quarantine until their procedure. Therefore, the pt can still have the scheduled procedure. This appointment will be cancelled.   These are the guidelines as follows: Guidance: Patient previously tested + COVID; now past 90 day window seeking elective surgery (asymptomatic)  Retest patient If negative, proceed with surgery If positive, postpone surgery for 10 days from positive test Patient to quarantine for the (10 days) Do not retest again prior to surgery (even if scheduled a couple of weeks out) Use standard precautions for surgery

## 2019-12-10 NOTE — Progress Notes (Signed)
Patient Care Team: Rutherford Guys, MD as PCP - General (Family Medicine) Rockwell Germany, RN as Oncology Nurse Navigator Mauro Kaufmann, RN as Oncology Nurse Navigator Rolm Bookbinder, MD as Consulting Physician (General Surgery) Nicholas Lose, MD as Consulting Physician (Hematology and Oncology) Kyung Rudd, MD as Consulting Physician (Radiation Oncology)  DIAGNOSIS:    ICD-10-CM   1. Malignant neoplasm of upper-outer quadrant of right breast in female, estrogen receptor positive ([place])  C50.411    Z17.0     SUMMARY OF ONCOLOGIC HISTORY: Oncology History  Malignant neoplasm of upper-outer quadrant of right breast in female, estrogen receptor positive ([place])  06/21/2019 Initial Diagnosis   Patient palpated a painful right breast lump x3 weeks. Mammogram showed a 2.0cm right breast mass at the 10 o'clock position, a 3.6cm enlarged right axillary lymph node, and a 0.5cm left breast mass. Biopsy showed, in the right breast and axilla, IDC, grade 3, HER-2 equivocal by IHC, + by FISH, ER+ 40%, PR - 0%, Ki67 80%, and in the left breast, fibrocystic changes, no malignancy.    07/04/2019 Cancer Staging   Staging form: Breast, AJCC 8th Edition - Clinical stage from 07/04/2019: Stage IIB (cT2, cN1(f), cM0, G3, ER+, PR-, HER2+) - Signed by Nicholas Lose, MD on 07/05/2019   07/14/2019 - 10/07/2019 Chemotherapy   The patient had dexamethasone (DECADRON) 4 MG tablet, 4 mg (100 % of original dose 4 mg), Oral, Daily, 1 of 1 cycle, Start date: 07/05/2019, End date: 08/04/2019 Dose modification: 4 mg (original dose 4 mg, Cycle 0) palonosetron (ALOXI) injection 0.25 mg, 0.25 mg, Intravenous,  Once, 5 of 5 cycles Administration: 0.25 mg (07/14/2019), 0.25 mg (08/04/2019), 0.25 mg (10/05/2019), 0.25 mg (08/25/2019), 0.25 mg (09/14/2019) pegfilgrastim-jmdb (FULPHILA) injection 6 mg, 6 mg, Subcutaneous,  Once, 5 of 5 cycles Administration: 6 mg (07/17/2019), 6 mg (08/07/2019), 6 mg (10/07/2019), 6 mg (08/29/2019), 6 mg  (09/16/2019) CARBOplatin (PARAPLATIN) 700 mg in sodium chloride 0.9 % 250 mL chemo infusion, 700 mg (100 % of original dose 700 mg), Intravenous,  Once, 5 of 5 cycles Dose modification: 700 mg (original dose 700 mg, Cycle 1, Reason: Provider Judgment), 600 mg (original dose 700 mg, Cycle 2, Reason: Dose not tolerated) Administration: 700 mg (07/14/2019), 600 mg (08/04/2019), 600 mg (10/05/2019), 600 mg (08/25/2019), 600 mg (09/14/2019) DOCEtaxel (TAXOTERE) 170 mg in sodium chloride 0.9 % 250 mL chemo infusion, 75 mg/m2 = 170 mg, Intravenous,  Once, 5 of 5 cycles Dose modification: 65 mg/m2 (original dose 75 mg/m2, Cycle 2, Reason: Dose not tolerated) Administration: 170 mg (07/14/2019), 150 mg (08/04/2019), 150 mg (10/05/2019), 150 mg (08/25/2019), 150 mg (09/14/2019) fosaprepitant (EMEND) 150 mg in sodium chloride 0.9 % 145 mL IVPB, 150 mg, Intravenous,  Once, 5 of 5 cycles Administration: 150 mg (07/14/2019), 150 mg (08/04/2019), 150 mg (10/05/2019), 150 mg (08/25/2019), 150 mg (09/14/2019) pertuzumab (PERJETA) 840 mg in sodium chloride 0.9 % 250 mL chemo infusion, 840 mg, Intravenous, Once, 5 of 5 cycles Administration: 840 mg (07/14/2019), 420 mg (08/04/2019), 420 mg (10/05/2019), 420 mg (08/25/2019), 420 mg (09/14/2019) trastuzumab-dkst (OGIVRI) 900 mg in sodium chloride 0.9 % 250 mL chemo infusion, 903 mg, Intravenous,  Once, 5 of 5 cycles Administration: 900 mg (07/14/2019), 672 mg (08/04/2019), 672 mg (10/05/2019), 672 mg (08/25/2019), 672 mg (09/14/2019)  for chemotherapy treatment.    07/22/2019 Genetic Testing   Negative genetic testing:  No pathogenic variants detected on the [facility] Common Hereditary Cancers Panel. The report date is 07/22/2019.  The Common Hereditary  Cancers Panel offered by [facility] includes sequencing and/or deletion duplication testing of the following 48 genes: APC, ATM, AXIN2, BARD1, BMPR1A, BRCA1, BRCA2, BRIP1, CDH1, CDK4, CDKN2A (p14ARF), CDKN2A (p16INK4a), CHEK2, CTNNA1, DICER1, EPCAM  (Deletion/duplication testing only), GREM1 (promoter region deletion/duplication testing only), KIT, MEN1, MLH1, MSH2, MSH3, MSH6, MUTYH, NBN, NF1, NHTL1, PALB2, PDGFRA, PMS2, POLD1, POLE, PTEN, RAD50, RAD51C, RAD51D, RNF43, SDHB, SDHC, SDHD, SMAD4, SMARCA4. STK11, TP53, TSC1, TSC2, and VHL.  The following genes were evaluated for sequence changes only: SDHA and HOXB13 c.251G>A variant only.    11/20/2019 -  Chemotherapy   The patient had pertuzumab (PERJETA) 420 mg in sodium chloride 0.9 % 250 mL chemo infusion, 420 mg (50 % of original dose 840 mg), Intravenous, Once, 1 of 6 cycles Dose modification: 420 mg (original dose 840 mg, Cycle 1, Reason: Provider Judgment) Administration: 420 mg (11/20/2019) trastuzumab-dkst (OGIVRI) 630 mg in sodium chloride 0.9 % 250 mL chemo infusion, 6 mg/kg = 630 mg (75 % of original dose 8 mg/kg), Intravenous,  Once, 1 of 6 cycles Dose modification: 6 mg/kg (original dose 8 mg/kg, Cycle 1, Reason: Provider Judgment) Administration: 630 mg (11/20/2019)  for chemotherapy treatment.      CHIEF COMPLIANT: Follow-up to discuss further treatment, receiving Herceptin and Perjeta today  INTERVAL HISTORY: Heidi Costa is a 39 y.o. with above-mentioned history of HER-2 positive breast cancer whocompletedneoadjuvant chemotherapyand is currently on Herceptin Perjeta maintenance. Echo on 11/21/19 showed an ejection fraction of 55-60%. Breast MRI on 11/22/19 showed significant improvement s/p treatment, with residual malignancy measuring 15.9cm, continued mild diffuse right breast skin thickening, and improved right axillary metastatic adenopathy. She presents to the clinic todayto discuss furthertreatment.   She is here to receive Herceptin and Perjeta.  She tells me that she occasionally feels nauseated.  ALLERGIES:  is allergic to aspirin.  MEDICATIONS:  Current Outpatient Medications  Medication Sig Dispense Refill  . acetaminophen (TYLENOL) 500 MG tablet Take  500-1,000 mg by mouth daily as needed for pain.     Marland Kitchen albuterol (VENTOLIN HFA) 108 (90 Base) MCG/ACT inhaler Inhale 2 puffs into the lungs every 6 (six) hours as needed for wheezing or shortness of breath. 18 g 2  . amLODipine (NORVASC) 5 MG tablet Take 1 tablet (5 mg total) by mouth daily. 90 tablet 1  . buPROPion (WELLBUTRIN XL) 300 MG 24 hr tablet Take 1 tablet (300 mg total) by mouth daily. 1 tab qam x 7 days then increase to 2 pills 90 tablet 1  . fluticasone (FLOVENT HFA) 44 MCG/ACT inhaler Inhale 1 puff into the lungs 2 (two) times daily. 1 Inhaler 5  . ibuprofen (ADVIL) 200 MG tablet Take 200-400 mg by mouth every 6 (six) hours as needed for mild pain.    Marland Kitchen lisinopril (ZESTRIL) 20 MG tablet Take 1 tablet (20 mg total) by mouth daily. 90 tablet 1  . ondansetron (ZOFRAN ODT) 8 MG disintegrating tablet Take 1 tablet (8 mg total) by mouth every 8 (eight) hours as needed for nausea or vomiting. 20 tablet 0  . potassium chloride SA (KLOR-CON) 20 MEQ tablet Take 1 tablet (20 mEq total) by mouth daily. 10 tablet 0   No current facility-administered medications for this visit.   Facility-Administered Medications Ordered in Other Visits  Medication Dose Route Frequency Provider Last Rate Last Admin  . [START ON 12/12/2019] feeding supplement (ENSURE PRE-SURGERY) liquid 296 mL  296 mL Oral Once Emelia Loron, MD        PHYSICAL EXAMINATION: ECOG  PERFORMANCE STATUS: 1 - Symptomatic but completely ambulatory  Vitals:   12/11/19 1257  BP: (!) 145/83  Pulse: 73  Resp: 18  Temp: 97.9 F (36.6 C)  SpO2: 100%   Filed Weights   12/11/19 1257  Weight: 228 lb 9.6 oz (103.7 kg)    LABORATORY DATA:  I have reviewed the data as listed CMP Latest Ref Rng & Units 11/20/2019 10/05/2019 09/22/2019  Glucose 70 - 99 mg/dL 101(H) 102(H) 124(H)  BUN 6 - 20 mg/dL $Remove'10 8 11  'oDbeSLk$ Creatinine 0.44 - 1.00 mg/dL 0.88 0.83 1.18(H)  Sodium 135 - 145 mmol/L 141 143 140  Potassium 3.5 - 5.1 mmol/L 3.8 3.5 3.2(L)    Chloride 98 - 111 mmol/L 108 105 102  CO2 22 - 32 mmol/L $RemoveB'25 26 22  'bCwTTIdC$ Calcium 8.9 - 10.3 mg/dL 9.5 8.5(L) 8.9  Total Protein 6.5 - 8.1 g/dL 6.6 5.6(L) 6.3(L)  Total Bilirubin 0.3 - 1.2 mg/dL 0.4 0.3 0.3  Alkaline Phos 38 - 126 U/L 59 42 59  AST 15 - 41 U/L $Remo'18 16 17  'WGVsp$ ALT 0 - 44 U/L $Remo'23 13 20    'abYPh$ Lab Results  Component Value Date   WBC 5.7 12/11/2019   HGB 10.7 (L) 12/11/2019   HCT 32.0 (L) 12/11/2019   MCV 103.9 (H) 12/11/2019   PLT 323 12/11/2019   NEUTROABS 2.3 12/11/2019    ASSESSMENT & PLAN:  Malignant neoplasm of upper-outer quadrant of right breast in female, estrogen receptor positive ([place]) 06/21/2019:Patient palpated a painful right breast lump x3 weeks. Mammogram showed a 2.0cm right breast mass at the 10 o'clock position, a 3.6cm enlarged right axillary lymph node, and a 0.5cm left breast mass. Biopsy showed, in the right breast and axilla(lymph node), IDC, grade 3, HER-2 equivocal by IHC, + by FISH, ER+ 40%, PR - 0%, Ki67 80%,  left breast, fibrocystic changes, no malignancy.Can be followed.  Treatment plan: 1. Neoadjuvant chemotherapy with TCH Perjeta 6 cycles followed by HerceptinPerjeta versus Kadcylamaintenance for 1 year started 07/12/2019 2. Followed by breast conserving surgery if possible with sentinel lymph node study 3. Followed by adjuvant radiation therapy if patient had lumpectomy 4.Consideration for neratinib Genetic counseling Breast MRI on 07/07/19 showed the 2.9cm biopsy-proven malignancy in the right breast and axilla, highly suspicious non-mass enhancement in the lateral right breast spanning 14.5cm, and no left breast malignancy ----------------------------------------------------------------------------------------------------------------------------------------------------------- Current treatment: Completed 6 cycles ofTCH Perjeta.Current treatment: Herceptin and Perjeta maintenance Echocardiogram 07/12/2019 EF 60-65%  Breast MRI 11/22/2019:  Significant improvement in the malignancy with residual changes spanning 15.9 x 6.2 x 3.2 cm.  Continued mild diffuse skin thickening, improved right axillary adenopathy  Treatment plan: Breast conserving surgery with targeted node dissection Return to clinic after surgery to discuss the pathology report and to decide on her appropriate maintenance between Herceptin Perjeta versus Kadcyla versus Compass HER-2 trial    No orders of the defined types were placed in this encounter.  The patient has a good understanding of the overall plan. she agrees with it. she will call with any problems that may develop before the next visit here.  Total time spent: 30 mins including face to face time and time spent for planning, charting and coordination of care  Nicholas Lose, MD 12/11/2019  I, Cloyde Reams Dorshimer, am acting as scribe for Dr. Nicholas Lose.  I have reviewed the above documentation for accuracy and completeness, and I agree with the above.

## 2019-12-11 ENCOUNTER — Encounter: Payer: Self-pay | Admitting: *Deleted

## 2019-12-11 ENCOUNTER — Inpatient Hospital Stay: Payer: BC Managed Care – PPO

## 2019-12-11 ENCOUNTER — Inpatient Hospital Stay (HOSPITAL_BASED_OUTPATIENT_CLINIC_OR_DEPARTMENT_OTHER): Payer: BC Managed Care – PPO | Admitting: Hematology and Oncology

## 2019-12-11 ENCOUNTER — Other Ambulatory Visit: Payer: Self-pay

## 2019-12-11 ENCOUNTER — Inpatient Hospital Stay: Payer: BC Managed Care – PPO | Attending: Hematology and Oncology

## 2019-12-11 VITALS — BP 135/78 | HR 75 | Temp 98.2°F | Resp 20

## 2019-12-11 DIAGNOSIS — Z170 Estrogen receptor positive status [ER+]: Secondary | ICD-10-CM

## 2019-12-11 DIAGNOSIS — Z5112 Encounter for antineoplastic immunotherapy: Secondary | ICD-10-CM | POA: Diagnosis not present

## 2019-12-11 DIAGNOSIS — Z9221 Personal history of antineoplastic chemotherapy: Secondary | ICD-10-CM | POA: Insufficient documentation

## 2019-12-11 DIAGNOSIS — Z923 Personal history of irradiation: Secondary | ICD-10-CM | POA: Insufficient documentation

## 2019-12-11 DIAGNOSIS — C773 Secondary and unspecified malignant neoplasm of axilla and upper limb lymph nodes: Secondary | ICD-10-CM | POA: Insufficient documentation

## 2019-12-11 DIAGNOSIS — Z79899 Other long term (current) drug therapy: Secondary | ICD-10-CM | POA: Diagnosis not present

## 2019-12-11 DIAGNOSIS — C50411 Malignant neoplasm of upper-outer quadrant of right female breast: Secondary | ICD-10-CM | POA: Insufficient documentation

## 2019-12-11 DIAGNOSIS — R110 Nausea: Secondary | ICD-10-CM | POA: Diagnosis not present

## 2019-12-11 DIAGNOSIS — Z95828 Presence of other vascular implants and grafts: Secondary | ICD-10-CM

## 2019-12-11 LAB — CMP (CANCER CENTER ONLY)
ALT: 15 U/L (ref 0–44)
AST: 16 U/L (ref 15–41)
Albumin: 3.5 g/dL (ref 3.5–5.0)
Alkaline Phosphatase: 64 U/L (ref 38–126)
Anion gap: 9 (ref 5–15)
BUN: 10 mg/dL (ref 6–20)
CO2: 24 mmol/L (ref 22–32)
Calcium: 9.2 mg/dL (ref 8.9–10.3)
Chloride: 108 mmol/L (ref 98–111)
Creatinine: 0.84 mg/dL (ref 0.44–1.00)
GFR, Est AFR Am: >60 mL/min (ref >60)
GFR, Estimated: >60 mL/min (ref >60)
Glucose, Bld: 125 mg/dL — ABNORMAL HIGH (ref 70–99)
Potassium: 3.7 mmol/L (ref 3.5–5.1)
Sodium: 141 mmol/L (ref 135–145)
Total Bilirubin: 0.3 mg/dL (ref 0.3–1.2)
Total Protein: 7 g/dL (ref 6.5–8.1)

## 2019-12-11 LAB — CBC WITH DIFFERENTIAL (CANCER CENTER ONLY)
Abs Immature Granulocytes: 0.01 10*3/uL (ref 0.00–0.07)
Basophils Absolute: 0 10*3/uL (ref 0.0–0.1)
Basophils Relative: 1 %
Eosinophils Absolute: 0.3 10*3/uL (ref 0.0–0.5)
Eosinophils Relative: 5 %
HCT: 32 % — ABNORMAL LOW (ref 36.0–46.0)
Hemoglobin: 10.7 g/dL — ABNORMAL LOW (ref 12.0–15.0)
Immature Granulocytes: 0 %
Lymphocytes Relative: 46 %
Lymphs Abs: 2.7 10*3/uL (ref 0.7–4.0)
MCH: 34.7 pg — ABNORMAL HIGH (ref 26.0–34.0)
MCHC: 33.4 g/dL (ref 30.0–36.0)
MCV: 103.9 fL — ABNORMAL HIGH (ref 80.0–100.0)
Monocytes Absolute: 0.3 10*3/uL (ref 0.1–1.0)
Monocytes Relative: 6 %
Neutro Abs: 2.3 10*3/uL (ref 1.7–7.7)
Neutrophils Relative %: 42 %
Platelet Count: 323 10*3/uL (ref 150–400)
RBC: 3.08 MIL/uL — ABNORMAL LOW (ref 3.87–5.11)
RDW: 13.4 % (ref 11.5–15.5)
WBC Count: 5.7 10*3/uL (ref 4.0–10.5)
nRBC: 0 % (ref 0.0–0.2)

## 2019-12-11 LAB — PREGNANCY, URINE: Preg Test, Ur: NEGATIVE

## 2019-12-11 MED ORDER — DIPHENHYDRAMINE HCL 25 MG PO CAPS
ORAL_CAPSULE | ORAL | Status: AC
  Filled 2019-12-11: qty 2

## 2019-12-11 MED ORDER — ACETAMINOPHEN 325 MG PO TABS
ORAL_TABLET | ORAL | Status: AC
  Filled 2019-12-11: qty 2

## 2019-12-11 MED ORDER — SODIUM CHLORIDE 0.9 % IV SOLN
Freq: Once | INTRAVENOUS | Status: AC
  Filled 2019-12-11: qty 250

## 2019-12-11 MED ORDER — SODIUM CHLORIDE 0.9% FLUSH
10.0000 mL | INTRAVENOUS | Status: DC | PRN
  Administered 2019-12-11: 10 mL
  Filled 2019-12-11: qty 10

## 2019-12-11 MED ORDER — ACETAMINOPHEN 325 MG PO TABS
650.0000 mg | ORAL_TABLET | Freq: Once | ORAL | Status: AC
  Administered 2019-12-11: 650 mg via ORAL

## 2019-12-11 MED ORDER — ENSURE PRE-SURGERY PO LIQD: 296.0000 mL | Freq: Once | ORAL | Status: DC

## 2019-12-11 MED ORDER — TRASTUZUMAB-DKST CHEMO 150 MG IV SOLR
6.0000 mg/kg | Freq: Once | INTRAVENOUS | Status: AC
  Administered 2019-12-11: 630 mg via INTRAVENOUS
  Filled 2019-12-11: qty 30

## 2019-12-11 MED ORDER — SODIUM CHLORIDE 0.9 % IV SOLN
420.0000 mg | Freq: Once | INTRAVENOUS | Status: AC
  Administered 2019-12-11: 420 mg via INTRAVENOUS
  Filled 2019-12-11: qty 14

## 2019-12-11 MED ORDER — SODIUM CHLORIDE 0.9% FLUSH
10.0000 mL | Freq: Once | INTRAVENOUS | Status: AC
  Administered 2019-12-11: 10 mL
  Filled 2019-12-11: qty 10

## 2019-12-11 MED ORDER — HEPARIN SOD (PORK) LOCK FLUSH 100 UNIT/ML IV SOLN
500.0000 [IU] | Freq: Once | INTRAVENOUS | Status: AC | PRN
  Administered 2019-12-11: 500 [IU]
  Filled 2019-12-11: qty 5

## 2019-12-11 MED ORDER — DIPHENHYDRAMINE HCL 25 MG PO CAPS
50.0000 mg | ORAL_CAPSULE | Freq: Once | ORAL | Status: AC
  Administered 2019-12-11: 50 mg via ORAL

## 2019-12-11 NOTE — Progress Notes (Signed)
Pt. declines to stay 30 minute post observation. States she is tolerating treatment well. Left via ambulation, no respiratory distress noted

## 2019-12-11 NOTE — Progress Notes (Signed)

## 2019-12-11 NOTE — Assessment & Plan Note (Signed)
06/21/2019:Patient palpated a painful right breast lump x3 weeks. Mammogram showed a 2.0cm right breast mass at the 10 o'clock position, a 3.6cm enlarged right axillary lymph node, and a 0.5cm left breast mass. Biopsy showed, in the right breast and axilla(lymph node), IDC, grade 3, HER-2 equivocal by IHC, + by FISH, ER+ 40%, PR - 0%, Ki67 80%,  left breast, fibrocystic changes, no malignancy.Can be followed.  Treatment plan: 1. Neoadjuvant chemotherapy with TCH Perjeta 6 cycles followed by HerceptinPerjeta versus Kadcylamaintenance for 1 year started 07/12/2019 2. Followed by breast conserving surgery if possible with sentinel lymph node study 3. Followed by adjuvant radiation therapy if patient had lumpectomy 4.Consideration for neratinib Genetic counseling Breast MRI on 07/07/19 showed the 2.9cm biopsy-proven malignancy in the right breast and axilla, highly suspicious non-mass enhancement in the lateral right breast spanning 14.5cm, and no left breast malignancy ----------------------------------------------------------------------------------------------------------------------------------------------------------- Current treatment: Completed 6 cycles ofTCH Perjeta.Current treatment: Herceptin and Perjeta maintenance Echocardiogram 07/12/2019 EF 60-65%  Breast MRI 11/22/2019: Significant improvement in the malignancy with residual changes spanning 15.9 x 6.2 x 3.2 cm.  Continued mild diffuse skin thickening, improved right axillary adenopathy  Treatment plan: Mastectomy with targeted node dissection Return to clinic after surgery to discuss the pathology report and to decide on her appropriate maintenance between Herceptin Perjeta versus Kadcyla versus Compass HER-2 trial

## 2019-12-11 NOTE — Patient Instructions (Signed)
[facility] Discharge Instructions for Patients Receiving Chemotherapy  Today you received the following immunotherapy agents: Trastuzumab and Pertuzumab (Perjeta)  To help prevent nausea and vomiting after your treatment, we encourage you to take your nausea medication as directed by your MD.   If you develop nausea and vomiting that is not controlled by your nausea medication, call the clinic.   BELOW ARE SYMPTOMS THAT SHOULD BE REPORTED IMMEDIATELY:  *FEVER GREATER THAN 100.5 F  *CHILLS WITH OR WITHOUT FEVER  NAUSEA AND VOMITING THAT IS NOT CONTROLLED WITH YOUR NAUSEA MEDICATION  *UNUSUAL SHORTNESS OF BREATH  *UNUSUAL BRUISING OR BLEEDING  TENDERNESS IN MOUTH AND THROAT WITH OR WITHOUT PRESENCE OF ULCERS  *URINARY PROBLEMS  *BOWEL PROBLEMS  UNUSUAL RASH Items with * indicate a potential emergency and should be followed up as soon as possible.  Feel free to call the clinic should you have any questions or concerns. The clinic phone number is [phone].  Please show the CHEMO ALERT CARD at check-in to the Emergency Department and triage nurse.   

## 2019-12-12 ENCOUNTER — Telehealth: Payer: Self-pay | Admitting: Hematology and Oncology

## 2019-12-12 NOTE — Telephone Encounter (Signed)
No 9/13 los, no changes made to pt schedule

## 2019-12-12 NOTE — Progress Notes (Signed)
Unable to find COVID lab result from 10/18/19. Patient states she will bring a copy of her COVID positive lab result from 09/2119 with her tomorrow.

## 2019-12-13 ENCOUNTER — Other Ambulatory Visit: Payer: Self-pay

## 2019-12-13 ENCOUNTER — Ambulatory Visit (HOSPITAL_COMMUNITY)
Admission: RE | Admit: 2019-12-13 | Discharge: 2019-12-13 | Disposition: A | Discharge status: Home / Self Care | Payer: BC Managed Care – PPO | Source: Ambulatory Visit | Attending: General Surgery | Admitting: General Surgery

## 2019-12-13 ENCOUNTER — Ambulatory Visit (HOSPITAL_BASED_OUTPATIENT_CLINIC_OR_DEPARTMENT_OTHER): Payer: BC Managed Care – PPO | Admitting: Anesthesiology

## 2019-12-13 ENCOUNTER — Encounter (HOSPITAL_BASED_OUTPATIENT_CLINIC_OR_DEPARTMENT_OTHER): Payer: Self-pay | Admitting: General Surgery

## 2019-12-13 ENCOUNTER — Ambulatory Visit (HOSPITAL_BASED_OUTPATIENT_CLINIC_OR_DEPARTMENT_OTHER)
Admission: RE | Admit: 2019-12-13 | Discharge: 2019-12-13 | Disposition: A | Discharge status: Home / Self Care | Payer: BC Managed Care – PPO | Attending: General Surgery | Admitting: General Surgery

## 2019-12-13 ENCOUNTER — Encounter (HOSPITAL_BASED_OUTPATIENT_CLINIC_OR_DEPARTMENT_OTHER)
Admission: RE | Disposition: A | Discharge status: Home / Self Care | Payer: Self-pay | Source: Home / Self Care | Attending: General Surgery

## 2019-12-13 DIAGNOSIS — Z923 Personal history of irradiation: Secondary | ICD-10-CM | POA: Insufficient documentation

## 2019-12-13 DIAGNOSIS — C50911 Malignant neoplasm of unspecified site of right female breast: Secondary | ICD-10-CM | POA: Diagnosis present

## 2019-12-13 DIAGNOSIS — I10 Essential (primary) hypertension: Secondary | ICD-10-CM | POA: Insufficient documentation

## 2019-12-13 DIAGNOSIS — Z170 Estrogen receptor positive status [ER+]: Secondary | ICD-10-CM

## 2019-12-13 DIAGNOSIS — Z853 Personal history of malignant neoplasm of breast: Secondary | ICD-10-CM | POA: Diagnosis not present

## 2019-12-13 HISTORY — PX: BREAST LUMPECTOMY WITH RADIOACTIVE SEED AND SENTINEL LYMPH NODE BIOPSY: SHX6550

## 2019-12-13 SURGERY — BREAST LUMPECTOMY WITH RADIOACTIVE SEED AND SENTINEL LYMPH NODE BIOPSY
Anesthesia: General | Site: Breast | Laterality: Right

## 2019-12-13 MED ORDER — CEFAZOLIN SODIUM-DEXTROSE 2-4 GM/100ML-% IV SOLN
INTRAVENOUS | Status: AC
  Filled 2019-12-13: qty 100

## 2019-12-13 MED ORDER — PROPOFOL 500 MG/50ML IV EMUL
INTRAVENOUS | Status: DC | PRN
  Administered 2019-12-13: 50 ug/kg/min via INTRAVENOUS

## 2019-12-13 MED ORDER — ACETAMINOPHEN 500 MG PO TABS
ORAL_TABLET | ORAL | Status: AC
  Filled 2019-12-13: qty 2

## 2019-12-13 MED ORDER — LACTATED RINGERS IV SOLN: INTRAVENOUS | Status: DC

## 2019-12-13 MED ORDER — SODIUM CHLORIDE (PF) 0.9 % IJ SOLN
INTRAVENOUS | Status: DC | PRN
  Administered 2019-12-13: 5 mL via INTRADERMAL

## 2019-12-13 MED ORDER — ACETAMINOPHEN 500 MG PO TABS
1000.0000 mg | ORAL_TABLET | ORAL | Status: AC
  Administered 2019-12-13: 1000 mg via ORAL

## 2019-12-13 MED ORDER — DEXMEDETOMIDINE (PRECEDEX) IN NS 20 MCG/5ML (4 MCG/ML) IV SYRINGE
PREFILLED_SYRINGE | INTRAVENOUS | Status: AC
  Filled 2019-12-13: qty 5

## 2019-12-13 MED ORDER — MIDAZOLAM HCL 2 MG/2ML IJ SOLN
2.0000 mg | Freq: Once | INTRAMUSCULAR | Status: AC
  Administered 2019-12-13: 1 mg via INTRAVENOUS

## 2019-12-13 MED ORDER — ONDANSETRON HCL 4 MG/2ML IJ SOLN: 4.0000 mg | Freq: Once | INTRAMUSCULAR | Status: DC | PRN

## 2019-12-13 MED ORDER — FENTANYL CITRATE (PF) 100 MCG/2ML IJ SOLN
INTRAMUSCULAR | Status: AC
  Filled 2019-12-13: qty 2

## 2019-12-13 MED ORDER — MIDAZOLAM HCL 2 MG/2ML IJ SOLN
INTRAMUSCULAR | Status: AC
  Filled 2019-12-13: qty 2

## 2019-12-13 MED ORDER — FENTANYL CITRATE (PF) 100 MCG/2ML IJ SOLN
25.0000 ug | INTRAMUSCULAR | Status: DC | PRN
  Administered 2019-12-13 (×3): 25 ug via INTRAVENOUS

## 2019-12-13 MED ORDER — ONDANSETRON HCL 4 MG/2ML IJ SOLN
INTRAMUSCULAR | Status: DC | PRN
  Administered 2019-12-13: 4 mg via INTRAVENOUS

## 2019-12-13 MED ORDER — FENTANYL CITRATE (PF) 100 MCG/2ML IJ SOLN
100.0000 ug | Freq: Once | INTRAMUSCULAR | Status: AC
  Administered 2019-12-13: 50 ug via INTRAVENOUS

## 2019-12-13 MED ORDER — FENTANYL CITRATE (PF) 100 MCG/2ML IJ SOLN
INTRAMUSCULAR | Status: DC | PRN
  Administered 2019-12-13 (×4): 50 ug via INTRAVENOUS

## 2019-12-13 MED ORDER — BUPIVACAINE HCL (PF) 0.25 % IJ SOLN
INTRAMUSCULAR | Status: DC | PRN
  Administered 2019-12-13: 8 mL

## 2019-12-13 MED ORDER — GABAPENTIN 100 MG PO CAPS
ORAL_CAPSULE | ORAL | Status: AC
  Filled 2019-12-13: qty 1

## 2019-12-13 MED ORDER — DEXAMETHASONE SODIUM PHOSPHATE 4 MG/ML IJ SOLN
INTRAMUSCULAR | Status: DC | PRN
  Administered 2019-12-13: 5 mg via INTRAVENOUS

## 2019-12-13 MED ORDER — GABAPENTIN 100 MG PO CAPS
100.0000 mg | ORAL_CAPSULE | ORAL | Status: AC
  Administered 2019-12-13: 100 mg via ORAL

## 2019-12-13 MED ORDER — OXYCODONE HCL 5 MG PO TABS
ORAL_TABLET | ORAL | Status: AC
  Filled 2019-12-13: qty 1

## 2019-12-13 MED ORDER — LIDOCAINE 2% (20 MG/ML) 5 ML SYRINGE
INTRAMUSCULAR | Status: DC | PRN
  Administered 2019-12-13: 60 mg via INTRAVENOUS

## 2019-12-13 MED ORDER — OXYCODONE HCL 5 MG PO TABS: 5.0000 mg | ORAL_TABLET | Freq: Four times a day (QID) | ORAL | 0 refills | Status: DC | PRN

## 2019-12-13 MED ORDER — CEFAZOLIN SODIUM-DEXTROSE 2-4 GM/100ML-% IV SOLN
2.0000 g | INTRAVENOUS | Status: AC
  Administered 2019-12-13: 2 g via INTRAVENOUS

## 2019-12-13 MED ORDER — ONDANSETRON HCL 4 MG/2ML IJ SOLN
INTRAMUSCULAR | Status: AC
  Filled 2019-12-13: qty 2

## 2019-12-13 MED ORDER — TECHNETIUM TC 99M SULFUR COLLOID FILTERED
1.0000 | Freq: Once | INTRAVENOUS | Status: AC | PRN
  Administered 2019-12-13: 1 via INTRADERMAL

## 2019-12-13 MED ORDER — LIDOCAINE 2% (20 MG/ML) 5 ML SYRINGE
INTRAMUSCULAR | Status: AC
  Filled 2019-12-13: qty 5

## 2019-12-13 MED ORDER — OXYCODONE HCL 5 MG PO TABS
5.0000 mg | ORAL_TABLET | Freq: Once | ORAL | Status: AC | PRN
  Administered 2019-12-13: 5 mg via ORAL

## 2019-12-13 MED ORDER — PROPOFOL 10 MG/ML IV BOLUS
INTRAVENOUS | Status: DC | PRN
  Administered 2019-12-13: 20 mg via INTRAVENOUS
  Administered 2019-12-13: 180 mg via INTRAVENOUS

## 2019-12-13 MED ORDER — MIDAZOLAM HCL 2 MG/2ML IJ SOLN
1.0000 mg | Freq: Once | INTRAMUSCULAR | Status: AC
  Administered 2019-12-13: 1 mg via INTRAVENOUS

## 2019-12-13 MED ORDER — OXYCODONE HCL 5 MG/5ML PO SOLN: 5.0000 mg | Freq: Once | ORAL | Status: AC | PRN

## 2019-12-13 SURGICAL SUPPLY — 59 items
ADH SKN CLS APL DERMABOND .7 (GAUZE/BANDAGES/DRESSINGS) ×1
APPLIER CLIP 9.375 MED OPEN (MISCELLANEOUS) ×6
BINDER BREAST LRG (GAUZE/BANDAGES/DRESSINGS) IMPLANT
BINDER BREAST MEDIUM (GAUZE/BANDAGES/DRESSINGS) IMPLANT
BINDER BREAST XLRG (GAUZE/BANDAGES/DRESSINGS) IMPLANT
BINDER BREAST XXLRG (GAUZE/BANDAGES/DRESSINGS) ×3 IMPLANT
BLADE SURG 15 STRL LF DISP TIS (BLADE) ×1 IMPLANT
BLADE SURG 15 STRL SS (BLADE) ×3
CANISTER SUC SOCK COL 7IN (MISCELLANEOUS) IMPLANT
CANISTER SUCT 1200ML W/VALVE (MISCELLANEOUS) IMPLANT
CHLORAPREP W/TINT 26 (MISCELLANEOUS) ×3 IMPLANT
CLIP APPLIE 9.375 MED OPEN (MISCELLANEOUS) ×2 IMPLANT
CLIP VESOCCLUDE SM WIDE 6/CT (CLIP) ×3 IMPLANT
CLOSURE WOUND 1/2 X4 (GAUZE/BANDAGES/DRESSINGS) ×1
COVER BACK TABLE 60X90IN (DRAPES) ×3 IMPLANT
COVER MAYO STAND STRL (DRAPES) ×3 IMPLANT
COVER PROBE W GEL 5X96 (DRAPES) ×3 IMPLANT
COVER WAND RF STERILE (DRAPES) IMPLANT
DECANTER SPIKE VIAL GLASS SM (MISCELLANEOUS) IMPLANT
DERMABOND ADVANCED (GAUZE/BANDAGES/DRESSINGS) ×2
DERMABOND ADVANCED .7 DNX12 (GAUZE/BANDAGES/DRESSINGS) ×1 IMPLANT
DRAPE LAPAROSCOPIC ABDOMINAL (DRAPES) ×3 IMPLANT
DRAPE UTILITY XL STRL (DRAPES) ×3 IMPLANT
ELECT COATED BLADE 2.86 ST (ELECTRODE) ×3 IMPLANT
ELECT REM PT RETURN 9FT ADLT (ELECTROSURGICAL) ×3
ELECTRODE REM PT RTRN 9FT ADLT (ELECTROSURGICAL) ×1 IMPLANT
GLOVE BIO SURGEON STRL SZ7 (GLOVE) ×6 IMPLANT
GLOVE BIOGEL PI IND STRL 7.5 (GLOVE) ×1 IMPLANT
GLOVE BIOGEL PI INDICATOR 7.5 (GLOVE) ×2
GOWN STRL REUS W/ TWL LRG LVL3 (GOWN DISPOSABLE) ×2 IMPLANT
GOWN STRL REUS W/TWL LRG LVL3 (GOWN DISPOSABLE) ×6
HEMOSTAT ARISTA ABSORB 3G PWDR (HEMOSTASIS) IMPLANT
HEMOSTAT SNOW SURGICEL 2X4 (HEMOSTASIS) ×3 IMPLANT
KIT MARKER MARGIN INK (KITS) ×3 IMPLANT
NDL SAFETY ECLIPSE 18X1.5 (NEEDLE) IMPLANT
NEEDLE HYPO 18GX1.5 SHARP (NEEDLE)
NEEDLE HYPO 25X1 1.5 SAFETY (NEEDLE) ×6 IMPLANT
NS IRRIG 1000ML POUR BTL (IV SOLUTION) IMPLANT
PACK BASIN DAY SURGERY FS (CUSTOM PROCEDURE TRAY) ×3 IMPLANT
PENCIL SMOKE EVACUATOR (MISCELLANEOUS) ×3 IMPLANT
RETRACTOR ONETRAX LX 90X20 (MISCELLANEOUS) ×3 IMPLANT
SLEEVE SCD COMPRESS KNEE MED (MISCELLANEOUS) ×3 IMPLANT
SPONGE LAP 4X18 RFD (DISPOSABLE) ×3 IMPLANT
STRIP CLOSURE SKIN 1/2X4 (GAUZE/BANDAGES/DRESSINGS) ×2 IMPLANT
SUT ETHILON 2 0 FS 18 (SUTURE) IMPLANT
SUT MNCRL AB 4-0 PS2 18 (SUTURE) ×3 IMPLANT
SUT MON AB 5-0 PS2 18 (SUTURE) IMPLANT
SUT SILK 2 0 SH (SUTURE) IMPLANT
SUT VIC AB 2-0 SH 27 (SUTURE) ×3
SUT VIC AB 2-0 SH 27XBRD (SUTURE) ×1 IMPLANT
SUT VIC AB 3-0 SH 27 (SUTURE) ×3
SUT VIC AB 3-0 SH 27X BRD (SUTURE) ×1 IMPLANT
SUT VIC AB 5-0 PS2 18 (SUTURE) IMPLANT
SYR CONTROL 10ML LL (SYRINGE) ×3 IMPLANT
TOWEL GREEN STERILE FF (TOWEL DISPOSABLE) ×3 IMPLANT
TRAY FAXITRON CT DISP (TRAY / TRAY PROCEDURE) ×3 IMPLANT
TUBE CONNECTING 20'X1/4 (TUBING)
TUBE CONNECTING 20X1/4 (TUBING) IMPLANT
YANKAUER SUCT BULB TIP NO VENT (SUCTIONS) ×3 IMPLANT

## 2019-12-13 NOTE — Interval H&P Note (Signed)
History and Physical Interval Note:  12/13/2019 9:50 AM  Heidi Costa  has presented today for surgery, with the diagnosis of RIGHT BREAST CANCER.  The various methods of treatment have been discussed with the patient and family. After consideration of risks, benefits and other options for treatment, the patient has consented to  Procedure(s) with comments: RIGHT BREAST LUMPECTOMY WITH RADIOACTIVE SEED AND RIGHT AXILLARY  SENTINEL LYMPH NODE BIOPSY, RIGHT AXILLARY NODE SEED GUIDED EXCISION, BLUE DYE INJECTION (Right) - PEC BLOCK as a surgical intervention.  The patient's history has been reviewed, patient examined, no change in status, stable for surgery.  I have reviewed the patient's chart and labs.  Questions were answered to the patient's satisfaction.     Rolm Bookbinder

## 2019-12-13 NOTE — Anesthesia Procedure Notes (Signed)
Anesthesia Regional Block: Pectoralis block   Pre-Anesthetic Checklist: ,, timeout performed, Correct Patient, Correct Site, Correct Laterality, Correct Procedure, Correct Position, site marked, Risks and benefits discussed,  Surgical consent,  Pre-op evaluation,  At surgeon's request and post-op pain management  Laterality: Right  Prep: chloraprep       Needles:  Injection technique: Single-shot  Needle Type: Echogenic Needle     Needle Length: 9cm  Needle Gauge: 21     Additional Needles:   Narrative:  Start time: 12/13/2019 9:10 AM End time: 12/13/2019 9:20 AM Injection made incrementally with aspirations every 5 mL.  Performed by: Personally  Anesthesiologist: Roberts Gaudy, MD  Additional Notes: 30 cc 0.25% bupivacaine 1:200 epi injected easily

## 2019-12-13 NOTE — Anesthesia Procedure Notes (Signed)
Procedure Name: LMA Insertion Date/Time: 12/13/2019 10:15 AM Performed by: Maryella Shivers, CRNA Pre-anesthesia Checklist: Patient identified, Emergency Drugs available, Suction available and Patient being monitored Patient Re-evaluated:Patient Re-evaluated prior to induction Oxygen Delivery Method: Circle system utilized Preoxygenation: Pre-oxygenation with 100% oxygen Induction Type: IV induction Ventilation: Mask ventilation without difficulty LMA: LMA inserted LMA Size: 4.0 Number of attempts: 1 Airway Equipment and Method: Bite block Placement Confirmation: positive ETCO2 Tube secured with: Tape Dental Injury: Teeth and Oropharynx as per pre-operative assessment

## 2019-12-13 NOTE — Transfer of Care (Signed)
Immediate Anesthesia Transfer of Care Note  Patient: RASHEMA SEAWRIGHT  Procedure(s) Performed: RIGHT BREAST LUMPECTOMY WITH RADIOACTIVE SEED AND RIGHT AXILLARY  SENTINEL LYMPH NODE BIOPSY, RIGHT AXILLARY NODE SEED GUIDED EXCISION, BLUE DYE INJECTION (Right Breast)  Patient Location: PACU  Anesthesia Type:GA combined with regional for post-op pain  Level of Consciousness: sedated  Airway & Oxygen Therapy: Patient Spontanous Breathing and Patient connected to face mask oxygen  Post-op Assessment: Report given to RN and Post -op Vital signs reviewed and stable  Post vital signs: Reviewed and stable  Last Vitals:  Vitals Value Taken Time  BP 153/89 12/13/19 1138  Temp    Pulse 82 12/13/19 1141  Resp 22 12/13/19 1141  SpO2 100 % 12/13/19 1141  Vitals shown include unvalidated device data.  Last Pain:  Vitals:   12/13/19 1140  TempSrc:   PainSc: (P) 0-No pain         Complications: No complications documented.

## 2019-12-13 NOTE — Op Note (Signed)
Preoperative diagnosis: Clinical stage II right breast cancer status post primary chemotherapy Postoperative diagnosis: Same as above Procedure: 1.  Right breast radioactive seed guided lumpectomy 2.  Right axillary targeted lymph node dissection (right axillary sentinel node seed guided excision and right deep axillary sentinel lymph node biopsy) 3.  Injection of blue dye for sentinel lymph node identification Surgeon: Dr. Serita Grammes Anesthesia: General with pectoral block Estimated blood loss: 80 cc Specimens: 1.  Right breast tissue marked with paint containing clip and seed 2.  Right breast targeted lymph node containing seed 3.  Right axillary sentinel lymph nodes with highest count of 999 4.  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 she had associated adh and lcis. she underwent radiotherapy and declined antiestrogens due to her concern for uterine cancer. she has been followed since then. she has no mass or dc. she is otherwise well. she has screening mm that shows a left breast stable area and ain right breast there is a 2 cm mm mass that on [place] measures 1.3x1.1x1.2 cm in size. there is no ax adenopathy. biopsy was done that shows gII idc with dcis er pos, pr pos, her 2 negative and Ki 10%. We discussed options and I discussed with radiology and decided to proceed with generous right lumpectomy and a TAD.    Procedure: After informed consent was obtained the patient was taken to the operating room.  She had a seed placed in her axilla as well as in the breast prior to beginning.  She underwent a pectoral block.  She was injected with technetium in the standard  periareolar fashion.  She had SCDs on.  Antibiotics were given.  She was placed under general anesthesia without complication.  Her right breast was prepped and draped in the standard sterile surgical fashion.  A surgical timeout was then performed.  I first infiltrated a mixture of methylene blue dye and saline in the retroareolar position.  I then massaged this.  This was done for later sentinel lymph node identification.  I then identified the radioactive seed in the lateral right breast.  I made a periareolar incision in order to hide the scar later.  I tunneled to the seed.  I then remove the seed with an attempt to get a margin of normal tissue around it.  I confirmed mammographically that I remove the seed and the clip.  I looked at all my margins with the 3D images I took and I elected to remove additional medial margin. I do think I took a lot of the disease that was seen possibly on MRI. We will await margins.  I then obtained hemostasis. I placed clips in the cavity.   I closed the cavity with 2-0 Vicryl suture.  I then closed the dermis with 3-0 Vicryl 5-0 Monocryl.  Glue and Steri-Strips were eventually applied.  I identified the radioactive seed in the low axilla.  I made an incision below the axillary hairline and carried out through the fascia.  I was able to identify the actual lymph node as well as the seed coming out of it.  I used cautery to excise this.  Mammogram showed the clip and seed present  This was clearly  the node that had been biopsied though.  This was not a sentinel node.  I then proceeded to remove several other sentinel lymph nodes that were radioactive. There was some bleeding that I controlled with clips and I did place some Arista in the cavity.   There was no background radioactivity when I had completed this procedure.  I then closed this with 2-0 Vicryl, 3-0 Vicryl, 4-0 Monocryl.  Glue and Steri-Strips were applied.  She tolerated this well was extubated and  transferred to recovery stable.

## 2019-12-13 NOTE — Anesthesia Postprocedure Evaluation (Signed)
Anesthesia Post Note  Patient: Heidi Costa  Procedure(s) Performed: RIGHT BREAST LUMPECTOMY WITH RADIOACTIVE SEED AND RIGHT AXILLARY  SENTINEL LYMPH NODE BIOPSY, RIGHT AXILLARY NODE SEED GUIDED EXCISION, BLUE DYE INJECTION (Right Breast)     Patient location during evaluation: PACU Anesthesia Type: General Level of consciousness: awake and alert Pain management: pain level controlled Vital Signs Assessment: post-procedure vital signs reviewed and stable Respiratory status: spontaneous breathing, nonlabored ventilation, respiratory function stable and patient connected to nasal cannula oxygen Cardiovascular status: blood pressure returned to baseline and stable Postop Assessment: no apparent nausea or vomiting Anesthetic complications: no   No complications documented.  Last Vitals:  Vitals:   12/13/19 1245 12/13/19 1315  BP: 121/79 122/89  Pulse: 76 66  Resp: 18 16  Temp:  36.9 C  SpO2: 100% 100%    Last Pain:  Vitals:   12/13/19 1315  TempSrc:   PainSc: 2                  Lisette Mancebo COKER

## 2019-12-13 NOTE — Progress Notes (Signed)
Nuc med at bedside for nuc injections.  Pt tolerated well, VSS.  No additional sedation given.

## 2019-12-13 NOTE — H&P (Signed)
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 she had associated adh and lcis. she underwent radiotherapy and declined antiestrogens due to her concern for uterine cancer. she has been followed since then. she has no mass or dc. she is otherwise well. she has screening mm that shows a left breast stable area and ain right breast there is a 2 cm mm mass that on [place] measures 1.3x1.1x1.2 cm in size. there is no ax adenopathy. biopsy was done that shows gII idc with dcis er pos, pr pos, her 2 negative and Ki 10%. she is here to discuss options today   Past Surgical History Darden Palmer, [place]; 11/20/2019 10:26 AM) Breast Biopsy  Bilateral, Left. Breast Mass; Local Excision  Left.  Allergies Darden Palmer, [place]; 11/20/2019 10:34 AM) No Known Drug Allergies  [07/23/2014]: Allergies Reconciled   Medication History Darden Palmer, [place]; 11/20/2019 10:35 AM) AmLODIPine Besylate (5MG  Tablet, Oral) Active. Losartan Potassium (100MG  Tablet, Oral) Active. Metoprolol Succinate ER (50MG  Tablet ER 24HR, Oral) Active. Medications Reconciled  Social History Darden Palmer, [place]; 11/20/2019 10:26 AM) Alcohol use  Occasional alcohol use. Caffeine use  Tea. No drug use  Tobacco use  Never smoker.  Family History Darden Palmer, [place]; 11/20/2019 10:26 AM) Diabetes Mellitus  Mother.  Other Problems Darden Palmer, [place]; 11/20/2019 10:26 AM) No pertinent past medical history     Review of Systems Darden Palmer RMA; 11/20/2019 10:27 AM) General Not Present- Appetite Loss, Chills, Fatigue, Fever, Night Sweats, Weight Gain and Weight Loss. Skin Not Present- Change in Wart/Mole, Dryness, Hives, Jaundice, New Lesions, Non-Healing Wounds, Rash and Ulcer. HEENT Not Present- Earache, Hearing Loss, Hoarseness, Nose Bleed, Oral Ulcers, Ringing in the Ears, Seasonal Allergies, Sinus Pain, Sore Throat, Visual Disturbances,  Wears glasses/contact lenses and Yellow Eyes. Respiratory Not Present- Bloody sputum, Chronic Cough, Difficulty Breathing, Snoring and Wheezing. Breast Not Present- Breast Mass, Breast Pain, Nipple Discharge and Skin Changes. Cardiovascular Not Present- Chest Pain, Difficulty Breathing Lying Down, Leg Cramps, Palpitations, Rapid Heart Rate, Shortness of Breath and Swelling of Extremities. Gastrointestinal Not Present- Abdominal Pain, Bloating, Bloody Stool, Change in Bowel Habits, Chronic diarrhea, Constipation, Difficulty Swallowing, Excessive gas, Gets full quickly at meals, Hemorrhoids, Indigestion, Nausea, Rectal Pain and Vomiting. Female Genitourinary Not Present- Frequency, Nocturia, Painful Urination, Pelvic Pain and Urgency. Musculoskeletal Not Present- Back Pain, Joint Pain, Joint Stiffness, Muscle Pain, Muscle Weakness and Swelling of Extremities. Neurological Not Present- Decreased Memory, Fainting, Headaches, Numbness, Seizures, Tingling, Tremor, Trouble walking and Weakness. Psychiatric Not Present- Anxiety, Bipolar, Change in Sleep Pattern, Depression, Fearful and Frequent crying. Endocrine Not Present- Cold Intolerance, Excessive Hunger, Hair Changes, Heat Intolerance, Hot flashes and New Diabetes. Hematology Not Present- Blood Thinners, Easy Bruising, Excessive bleeding, Gland problems, HIV and Persistent Infections.  Vitals Lattie Haw Vowinckel RMA; 11/20/2019 10:36 AM) 11/20/2019 10:35 AM Weight: 161.25 lb Height: 67in Body Surface Area: 1.85 m Body Mass Index: 25.26 kg/m  Temp.: 45F  Pulse: 53 (Regular)  P.OX: 98% (Room air) BP: 124/86(Sitting, Left Arm, Standard)       Physical Exam Rolm Bookbinder MD; 11/20/2019 11:11 AM) General Mental Status-Alert. Orientation-Oriented X3.  Breast Nipples-No Discharge. Breast Lump-No Palpable Breast Mass. Note: right breast lateral hematoma   Lymphatic Head & Neck  General Head & Neck Lymphatics:  Bilateral - Description - Normal. Axillary  General Axillary Region: Bilateral - Description - Normal. Note: no [place] adenopathy  Assessment & Plan Rolm Bookbinder MD; 11/20/2019 11:19 AM)  Right breast seed guided lumpectomy, right TAD I think that proceeding with lumpectomy and taking generous area reasonable and will see about margins. I am not overly concerned about nme now. discussed seed guided excision of prior pos node and sn biopsy. discussed lymphedema risk as well

## 2019-12-13 NOTE — Progress Notes (Signed)
Assisted Dr. Linna Caprice with right, ultrasound guided, pectoralis block. Side rails up, monitors on throughout procedure. See vital signs in flow sheet. Tolerated Procedure well.

## 2019-12-13 NOTE — Anesthesia Preprocedure Evaluation (Signed)
Anesthesia Evaluation  Patient identified by MRN, date of birth, ID band Patient awake    Reviewed: Allergy & Precautions, NPO status , Patient's Chart, lab work & pertinent test results  Airway Mallampati: III  TM Distance: >3 FB Neck ROM: Full    Dental  (+) Edentulous Upper, Dental Advisory Given   Pulmonary former smoker,    breath sounds clear to auscultation       Cardiovascular hypertension,  Rhythm:Regular Rate:Normal     Neuro/Psych    GI/Hepatic   Endo/Other    Renal/GU      Musculoskeletal   Abdominal (+) + obese,   Peds  Hematology   Anesthesia Other Findings   Reproductive/Obstetrics                             Anesthesia Physical Anesthesia Plan  ASA: III  Anesthesia Plan: General   Post-op Pain Management:    Induction: Intravenous  PONV Risk Score and Plan: Ondansetron and Dexamethasone  Airway Management Planned: LMA  Additional Equipment:   Intra-op Plan:   Post-operative Plan:   Informed Consent: I have reviewed the patients History and Physical, chart, labs and discussed the procedure including the risks, benefits and alternatives for the proposed anesthesia with the patient or authorized representative who has indicated his/her understanding and acceptance.     Dental advisory given  Plan Discussed with: CRNA and Anesthesiologist  Anesthesia Plan Comments:         Anesthesia Quick Evaluation

## 2019-12-13 NOTE — Discharge Instructions (Signed)
°  Post Anesthesia Home Care Instructions  Activity: Get plenty of rest for the remainder of the day. A responsible individual must stay with you for 24 hours following the procedure.  For the next 24 hours, DO NOT: -Drive a car -Paediatric nurse -Drink alcoholic beverages -Take any medication unless instructed by your physician -Make any legal decisions or sign important papers.  Meals: Start with liquid foods such as gelatin or soup. Progress to regular foods as tolerated. Avoid greasy, spicy, heavy foods. If nausea and/or vomiting occur, drink only clear liquids until the nausea and/or vomiting subsides. Call your physician if vomiting continues.  Special Instructions/Symptoms: Your throat may feel dry or sore from the anesthesia or the breathing tube placed in your throat during surgery. If this causes discomfort, gargle with warm salt water. The discomfort should disappear within 24 hours.  If you had a scopolamine patch placed behind your ear for the management of post- operative nausea and/or vomiting:  1. The medication in the patch is effective for 72 hours, after which it should be removed.  Wrap patch in a tissue and discard in the trash. Wash hands thoroughly with soap and water. 2. You may remove the patch earlier than 72 hours if you experience unpleasant side effects which may include dry mouth, dizziness or visual disturbances. 3. Avoid touching the patch. Wash your hands with soap and water after contact with the patch.    Regional Anesthesia Blocks  1. Numbness or the inability to move the "blocked" extremity may last from 3-48 hours after placement. The length of time depends on the medication injected and your individual response to the medication. If the numbness is not going away after 48 hours, call your surgeon.  2. The extremity that is blocked will need to be protected until the numbness is gone and the  Strength has returned. Because you cannot feel it, you will  need to take extra care to avoid injury. Because it may be weak, you may have difficulty moving it or using it. You may not know what position it is in without looking at it while the block is in effect.  3. For blocks in the legs and feet, returning to weight bearing and walking needs to be done carefully. You will need to wait until the numbness is entirely gone and the strength has returned. You should be able to move your leg and foot normally before you try and bear weight or walk. You will need someone to be with you when you first try to ensure you do not fall and possibly risk injury.  4. Bruising and tenderness at the needle site are common side effects and will resolve in a few days.  5. Persistent numbness or new problems with movement should be communicated to the surgeon or the [place] [phone] [place] [phone]).  May take Tylenol after 3pm, if needed.

## 2019-12-15 LAB — SURGICAL PATHOLOGY

## 2019-12-18 ENCOUNTER — Encounter (HOSPITAL_BASED_OUTPATIENT_CLINIC_OR_DEPARTMENT_OTHER): Payer: Self-pay | Admitting: General Surgery

## 2019-12-18 ENCOUNTER — Encounter: Payer: Self-pay | Admitting: *Deleted

## 2019-12-28 ENCOUNTER — Encounter: Payer: Self-pay | Admitting: Physical Therapy

## 2019-12-28 ENCOUNTER — Other Ambulatory Visit: Payer: Self-pay

## 2019-12-28 ENCOUNTER — Ambulatory Visit: Payer: BC Managed Care – PPO | Attending: General Surgery | Admitting: Physical Therapy

## 2019-12-28 DIAGNOSIS — Z170 Estrogen receptor positive status [ER+]: Secondary | ICD-10-CM | POA: Diagnosis present

## 2019-12-28 DIAGNOSIS — Z483 Aftercare following surgery for neoplasm: Secondary | ICD-10-CM | POA: Insufficient documentation

## 2019-12-28 DIAGNOSIS — R293 Abnormal posture: Secondary | ICD-10-CM | POA: Diagnosis present

## 2019-12-28 DIAGNOSIS — M25611 Stiffness of right shoulder, not elsewhere classified: Secondary | ICD-10-CM | POA: Diagnosis present

## 2019-12-28 DIAGNOSIS — C50411 Malignant neoplasm of upper-outer quadrant of right female breast: Secondary | ICD-10-CM | POA: Insufficient documentation

## 2019-12-28 NOTE — Patient Instructions (Signed)
Shoulder: Flexion (Supine)    With hands shoulder width apart, slowly lower dowel to floor behind head. Do not let elbows bend. Keep back flat. Hold _10-15___ seconds. Repeat _10___ times. Do _2___ sessions per day. CAUTION: Stretch slowly and gently.  Copyright  VHI. All rights reserved.  Shoulder: Abduction (Supine)    With right arm flat on floor, hold dowel in palm. Slowly move arm up to side of head by pushing with opposite arm. Do not let elbow bend. Hold _10-15___ seconds. Repeat _10___ times. Do _2___ sessions per day. CAUTION: Stretch slowly and gently.  Copyright  VHI. All rights reserved.

## 2019-12-28 NOTE — Therapy (Signed)
[place], [place], [address] Phone: [phone]   Fax:  [phone]  Physical Therapy Evaluation  Patient Details  Name: Heidi Costa MRN: [id number] Date of Birth: [birth year] Referring Provider (PT): [name]   Encounter Date: 12/28/2019   PT End of Session - 12/28/19 1349    Visit Number 2    Number of Visits 10    Date for PT Re-Evaluation 01/25/20    PT Start Time 1304    PT Stop Time 1346    PT Time Calculation (min) 42 min    Activity Tolerance Patient tolerated treatment well    Behavior During Therapy [facility] for tasks assessed/performed           Past Medical History:  Diagnosis Date  . Abnormality of gait 08/02/2012   resolved per patient on 07/12/19  . Anemia    iron taking in the past, not currently  . Anxiety   . Arthritis    spine- cerv.  & arm   . Asthma   . Bulging lumbar disc   . Cancer [facility])    Right Breast-starts chemo on 07/14/19  . Chronic arthralgias of knees and hips 08/02/2012  . Depression    Pt. took self off Celexa one month ago, states she did n't like how it made her feel .  Marland Kitchen Family history of cancer   . GERD (gastroesophageal reflux disease)    diet controlled  . Heart murmur    pt. been told that this is a fact, no echo or cardiac surveillance in the past   . HLD (hyperlipidemia)    no meds, diet controlled  . Hypertension   . Obesity   . Spinal stenosis     Past Surgical History:  Procedure Laterality Date  . ANTERIOR CERVICAL DECOMP/DISCECTOMY FUSION N/A 10/27/2012   Procedure: ANTERIOR CERVICAL DECOMPRESSION/DISCECTOMY FUSION 1 LEVEL;  Surgeon: Faythe Ghee, MD;  Location: [facility];  Service: Neurosurgery;  Laterality: N/A;  ANTERIOR CERVICAL DECOMPRESSION/DISCECTOMY FUSION 1 LEVEL  . BREAST LUMPECTOMY WITH RADIOACTIVE SEED AND SENTINEL LYMPH NODE BIOPSY Right 12/13/2019   Procedure: RIGHT BREAST LUMPECTOMY WITH RADIOACTIVE SEED AND RIGHT  AXILLARY  SENTINEL LYMPH NODE BIOPSY, RIGHT AXILLARY NODE SEED GUIDED EXCISION, BLUE DYE INJECTION;  Surgeon: Rolm Bookbinder, MD;  Location: [place];  Service: General;  Laterality: Right;  PEC BLOCK  . CHALAZION EXCISION     L eye   . PORTACATH PLACEMENT Left 07/13/2019   Procedure: INSERTION PORT-A-CATH WITH ULTRASOUND;  Surgeon: Rolm Bookbinder, MD;  Location: [place];  Service: General;  Laterality: Left;  Marland Kitchen VAGINAL DELIVERY  2002  . WISDOM TOOTH EXTRACTION      There were no vitals filed for this visit.    Subjective Assessment - 12/28/19 1309    Subjective My surgery was on the 15th. My arm and armpit hurt but it has been improving since I made the appointment.    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 It measures 2.3 cm and is located in the upper outer quadrant. She has an axillary node that measures 3.7 cm and was biopsied and is positive. Her cancer is ER positive, PR negative and HER2 positive with a Ki67 of 80%. There is also a 5 mm area on her left breast which will be followed on MRI. 12/13/19- R lumpectomy and SLNB (1 nodes- negative)    Patient Stated Goals Reduce  lymphedema risk and learn post op shoulder ROM HEP    Currently in Pain? Yes    Pain Score 1     Pain Location Arm    Pain Orientation Right;Upper    Pain Descriptors / Indicators Sore    Pain Type Surgical pain    Pain Onset 1 to 4 weeks ago    Pain Frequency Constant    Aggravating Factors  moving too much    Pain Relieving Factors taking tylenol and neurontin    Effect of Pain on Daily Activities hard to reach up but improving              [facility] PT Assessment - 12/28/19 0001      Assessment   Referring Provider (PT) [name]      Observation/Other Assessments   Observations pt has palpable cording in right axilla that extends to elbow, also has increased scar tissue or possible seroma in area of R lymph  node biopsy scar      AROM   Right Shoulder Flexion 155 Degrees    Right Shoulder ABduction 170 Degrees    Right Shoulder Internal Rotation 51 Degrees    Right Shoulder External Rotation 80 Degrees             LYMPHEDEMA/ONCOLOGY QUESTIONNAIRE - 12/28/19 0001      Right Upper Extremity Lymphedema   10 cm Proximal to Olecranon Process 39.5 cm    Olecranon Process 30.2 cm    10 cm Proximal to Ulnar Styloid Process 24.5 cm    Just Proximal to Ulnar Styloid Process 18 cm    Across Hand at [employer] 21.6 cm    At [place] of 2nd Digit 6.5 cm      Left Upper Extremity Lymphedema   10 cm Proximal to Olecranon Process 38.8 cm    Olecranon Process 29 cm    10 cm Proximal to Ulnar Styloid Process 24 cm    Just Proximal to Ulnar Styloid Process 18.2 cm    Across Hand at [employer] 21.5 cm    At [place] of 2nd Digit 6.5 cm                   Objective measurements completed on examination: See above findings.       [place] Adult PT Treatment/Exercise - 12/28/19 0001      Shoulder Exercises: Supine   Horizontal ABduction Right;5 reps;AAROM   with dowel with 5 sec holds   Flexion AAROM;Both;5 reps   with dowel with 5 sec holds     Manual Therapy   Manual Therapy Edema management;Myofascial release    Edema Management created foam chip pack for pt to wear in her bra over area of SLNB scar to help decrease swelling    Myofascial Release to cording from R axilla to wrist with numerous cords palpable                       PT Long Term Goals - 12/28/19 1357      PT LONG TERM GOAL #1   Title Patient will demonstrate she has regained full shoulder ROM and function post operatively compared to baselines.    Time 4    Period Weeks    Status On-going      PT LONG TERM GOAL #2   Title Pt will report a 75% improvement in comfort of RUE to allow pt to reach overhead without discomfort from cording.  Time 4    Period Weeks    Status New    Target Date  01/25/20      PT LONG TERM GOAL #3   Title Pt will demonstrate a 75% improvement in scar tissue in area of SLNB scar to allow improved comfort.    Time 4    Period Weeks    Status New    Target Date 01/25/20                  Plan - 12/28/19 1350    Clinical Impression Statement Pt returns for post op follow up visit today. She underwent a R lumpectomy and SLNB on 12/13/19. Overall her ROM has improved since baseline except for shoulder flexion which is 10 degrees less than baseline. She presents with signficant cording from R axilla to wrist which causes her discomfort. She has scar tissue and a possible seroma in area of lymph node biopsy scar. Created a foam chip pack for pt to wear over this to help. Pt would benefit from skilled PT services to decrease discomfort in R UE by decreasing cording and scar tissue.    Examination-Activity Limitations Lift;Reach Overhead;Carry    PT Frequency 2x / week    PT Duration 4 weeks    PT Treatment/Interventions ADLs/Self Care Home Management;Therapeutic exercise;Patient/family education;Manual techniques;Manual lymph drainage;Compression bandaging;Scar mobilization;Passive range of motion;Vasopneumatic Device    PT Next Visit Plan MFR to R cording from axilla to wrist, MLD to R UE following cording, PROM to RUE - pulleys and ball    PT Home Exercise Plan supine dowel exercises    Consulted and Agree with Plan of Care Patient           Patient will benefit from skilled therapeutic intervention in order to improve the following deficits and impairments:  Postural dysfunction, Decreased range of motion, Decreased knowledge of precautions, Impaired UE functional use, Pain, Increased edema, Increased fascial restricitons  Visit Diagnosis: Aftercare following surgery for neoplasm  Stiffness of right shoulder, not elsewhere classified  Abnormal posture  Malignant neoplasm of upper-outer quadrant of right breast in female, estrogen receptor  positive ([place])     Problem List Patient Active Problem List   Diagnosis Date Noted  . Depression, major, single episode, moderate ([place]) 10/17/2019  . Mild persistent asthma without complication 50/56/9794  . Port-A-Cath in place 08/04/2019  . Genetic testing 07/24/2019  . Family history of cancer   . Dysphagia 06/23/2019  . Malignant neoplasm of upper-outer quadrant of right breast in female, estrogen receptor positive ([place]) 06/21/2019  . Tobacco consumption 06/06/2019  . Breast mass, right 06/06/2019  . Sensation of change in body temperature 06/06/2019  . Neuropathic pain 06/06/2019  . Bite, insect 10/19/2016  . Swelling of eyelid, left 10/19/2016  . Essential hypertension 08/10/2016  . Disturbance of skin sensation 05/08/2013  . Cervical spondylosis with myelopathy 05/08/2013  . Abnormality of gait 08/02/2012  . Chronic arthralgias of knees and hips 08/02/2012    Allyson Sabal [facility] 12/28/2019, 2:02 PM  [place], [place], [address] Phone: [phone]   Fax:  [phone]  Name: Heidi Costa MRN: [id number] Date of Birth: [birth year]  Manus Gunning, PT 12/28/19 2:02 PM

## 2019-12-29 ENCOUNTER — Other Ambulatory Visit: Payer: Self-pay | Admitting: *Deleted

## 2019-12-29 DIAGNOSIS — Z170 Estrogen receptor positive status [ER+]: Secondary | ICD-10-CM

## 2019-12-31 NOTE — Progress Notes (Signed)
Patient Care Team: Rutherford Guys, MD as PCP - General (Family Medicine) Rockwell Germany, RN as Oncology Nurse Navigator Mauro Kaufmann, RN as Oncology Nurse Navigator Rolm Bookbinder, MD as Consulting Physician (General Surgery) Nicholas Lose, MD as Consulting Physician (Hematology and Oncology) Kyung Rudd, MD as Consulting Physician (Radiation Oncology)  DIAGNOSIS:    ICD-10-CM   1. Malignant neoplasm of upper-outer quadrant of right breast in female, estrogen receptor positive ([place])  C50.411    Z17.0     SUMMARY OF ONCOLOGIC HISTORY: Oncology History  Malignant neoplasm of upper-outer quadrant of right breast in female, estrogen receptor positive ([place])  06/21/2019 Initial Diagnosis   Patient palpated a painful right breast lump x3 weeks. Mammogram showed a 2.0cm right breast mass at the 10 o'clock position, a 3.6cm enlarged right axillary lymph node, and a 0.5cm left breast mass. Biopsy showed, in the right breast and axilla, IDC, grade 3, HER-2 equivocal by IHC, + by FISH, ER+ 40%, PR - 0%, Ki67 80%, and in the left breast, fibrocystic changes, no malignancy.    07/04/2019 Cancer Staging   Staging form: Breast, AJCC 8th Edition - Clinical stage from 07/04/2019: Stage IIB (cT2, cN1(f), cM0, G3, ER+, PR-, HER2+) - Signed by Nicholas Lose, MD on 07/05/2019   07/14/2019 - 10/07/2019 Chemotherapy   The patient had dexamethasone (DECADRON) 4 MG tablet, 4 mg (100 % of original dose 4 mg), Oral, Daily, 1 of 1 cycle, Start date: 07/05/2019, End date: 08/04/2019 Dose modification: 4 mg (original dose 4 mg, Cycle 0) palonosetron (ALOXI) injection 0.25 mg, 0.25 mg, Intravenous,  Once, 5 of 5 cycles Administration: 0.25 mg (07/14/2019), 0.25 mg (08/04/2019), 0.25 mg (10/05/2019), 0.25 mg (08/25/2019), 0.25 mg (09/14/2019) pegfilgrastim-jmdb (FULPHILA) injection 6 mg, 6 mg, Subcutaneous,  Once, 5 of 5 cycles Administration: 6 mg (07/17/2019), 6 mg (08/07/2019), 6 mg (10/07/2019), 6 mg (08/29/2019), 6 mg  (09/16/2019) CARBOplatin (PARAPLATIN) 700 mg in sodium chloride 0.9 % 250 mL chemo infusion, 700 mg (100 % of original dose 700 mg), Intravenous,  Once, 5 of 5 cycles Dose modification: 700 mg (original dose 700 mg, Cycle 1, Reason: Provider Judgment), 600 mg (original dose 700 mg, Cycle 2, Reason: Dose not tolerated) Administration: 700 mg (07/14/2019), 600 mg (08/04/2019), 600 mg (10/05/2019), 600 mg (08/25/2019), 600 mg (09/14/2019) DOCEtaxel (TAXOTERE) 170 mg in sodium chloride 0.9 % 250 mL chemo infusion, 75 mg/m2 = 170 mg, Intravenous,  Once, 5 of 5 cycles Dose modification: 65 mg/m2 (original dose 75 mg/m2, Cycle 2, Reason: Dose not tolerated) Administration: 170 mg (07/14/2019), 150 mg (08/04/2019), 150 mg (10/05/2019), 150 mg (08/25/2019), 150 mg (09/14/2019) fosaprepitant (EMEND) 150 mg in sodium chloride 0.9 % 145 mL IVPB, 150 mg, Intravenous,  Once, 5 of 5 cycles Administration: 150 mg (07/14/2019), 150 mg (08/04/2019), 150 mg (10/05/2019), 150 mg (08/25/2019), 150 mg (09/14/2019) pertuzumab (PERJETA) 840 mg in sodium chloride 0.9 % 250 mL chemo infusion, 840 mg, Intravenous, Once, 5 of 5 cycles Administration: 840 mg (07/14/2019), 420 mg (08/04/2019), 420 mg (10/05/2019), 420 mg (08/25/2019), 420 mg (09/14/2019) trastuzumab-dkst (OGIVRI) 900 mg in sodium chloride 0.9 % 250 mL chemo infusion, 903 mg, Intravenous,  Once, 5 of 5 cycles Administration: 900 mg (07/14/2019), 672 mg (08/04/2019), 672 mg (10/05/2019), 672 mg (08/25/2019), 672 mg (09/14/2019)  for chemotherapy treatment.    07/22/2019 Genetic Testing   Negative genetic testing:  No pathogenic variants detected on the [facility] Common Hereditary Cancers Panel. The report date is 07/22/2019.  The Common Hereditary  Cancers Panel offered by [facility] includes sequencing and/or deletion duplication testing of the following 48 genes: APC, ATM, AXIN2, BARD1, BMPR1A, BRCA1, BRCA2, BRIP1, CDH1, CDK4, CDKN2A (p14ARF), CDKN2A (p16INK4a), CHEK2, CTNNA1, DICER1, EPCAM  (Deletion/duplication testing only), GREM1 (promoter region deletion/duplication testing only), KIT, MEN1, MLH1, MSH2, MSH3, MSH6, MUTYH, NBN, NF1, NHTL1, PALB2, PDGFRA, PMS2, POLD1, POLE, PTEN, RAD50, RAD51C, RAD51D, RNF43, SDHB, SDHC, SDHD, SMAD4, SMARCA4. STK11, TP53, TSC1, TSC2, and VHL.  The following genes were evaluated for sequence changes only: SDHA and HOXB13 c.251G>A variant only.    11/20/2019 -  Chemotherapy   The patient had pertuzumab (PERJETA) 420 mg in sodium chloride 0.9 % 250 mL chemo infusion, 420 mg (50 % of original dose 840 mg), Intravenous, Once, 2 of 6 cycles Dose modification: 420 mg (original dose 840 mg, Cycle 1, Reason: Provider Judgment) Administration: 420 mg (11/20/2019), 420 mg (12/11/2019) trastuzumab-dkst (OGIVRI) 630 mg in sodium chloride 0.9 % 250 mL chemo infusion, 6 mg/kg = 630 mg (75 % of original dose 8 mg/kg), Intravenous,  Once, 2 of 6 cycles Dose modification: 6 mg/kg (original dose 8 mg/kg, Cycle 1, Reason: Provider Judgment) Administration: 630 mg (11/20/2019), 630 mg (12/11/2019)  for chemotherapy treatment.    12/13/2019 Surgery   Right lumpectomy [name]): IDC, 5 right axillary lymph nodes negative for carcinoma.      CHIEF COMPLIANT: Follow-up s/p lumpectomy, receiving Herceptin and Perjeta today  INTERVAL HISTORY: Heidi Costa is a 39 y.o. with above-mentioned history of HER-2 positive breast cancer whocompletedneoadjuvant chemotherapyand is currently on Herceptin Perjeta maintenance. She underwent a right lumpectomy on 12/13/19 with Dr. [name] for which pathology showed no residual invasive carcinoma, 5 right axillary lymph nodes negative for carcinoma. She presents to the clinic todayfor treatment.  She is healing and recovering very well from recent surgery.  She does have some numbness in the right arm.  She has felt a seroma in the right breast.  ALLERGIES:  is allergic to aspirin.  MEDICATIONS:  Current Outpatient Medications    Medication Sig Dispense Refill  . acetaminophen (TYLENOL) 500 MG tablet Take 500-1,000 mg by mouth daily as needed for pain.     Marland Kitchen albuterol (VENTOLIN HFA) 108 (90 Base) MCG/ACT inhaler Inhale 2 puffs into the lungs every 6 (six) hours as needed for wheezing or shortness of breath. 18 g 2  . amLODipine (NORVASC) 5 MG tablet Take 1 tablet (5 mg total) by mouth daily. 90 tablet 1  . buPROPion (WELLBUTRIN XL) 300 MG 24 hr tablet Take 1 tablet (300 mg total) by mouth daily. 1 tab qam x 7 days then increase to 2 pills 90 tablet 1  . fluticasone (FLOVENT HFA) 44 MCG/ACT inhaler Inhale 1 puff into the lungs 2 (two) times daily. 1 Inhaler 5  . oxyCODONE (OXY IR/ROXICODONE) 5 MG immediate release tablet Take 1 tablet (5 mg total) by mouth every 6 (six) hours as needed. 10 tablet 0   No current facility-administered medications for this visit.    PHYSICAL EXAMINATION: ECOG PERFORMANCE STATUS: 1 - Symptomatic but completely ambulatory  Vitals:   01/01/20 0925  BP: (!) 136/96  Pulse: 77  Resp: 18  Temp: (!) 97.2 F (36.2 C)   Filed Weights   01/01/20 0925  Weight: 229 lb (103.9 kg)    LABORATORY DATA:  I have reviewed the data as listed CMP Latest Ref Rng & Units 12/11/2019 11/20/2019 10/05/2019  Glucose 70 - 99 mg/dL 125(H) 101(H) 102(H)  BUN 6 - 20 mg/dL 10  10 8  Creatinine 0.44 - 1.00 mg/dL 0.84 0.88 0.83  Sodium 135 - 145 mmol/L 141 141 143  Potassium 3.5 - 5.1 mmol/L 3.7 3.8 3.5  Chloride 98 - 111 mmol/L 108 108 105  CO2 22 - 32 mmol/L _0 Calcium 8.9 - 10.3 mg/dL 9.2 9.5 8.5(L)  Total Protein 6.5 - 8.1 g/dL 7.0 6.6 5.6(L)  Total Bilirubin 0.3 - 1.2 mg/dL 0.3 0.4 0.3  Alkaline Phos 38 - 126 U/L 64 59 42  AST 15 - 41 U/L _1 ALT 0 - 44 U/L _2 Lab Results  Component Value Date   WBC 6.5 01/01/2020   HGB 10.9 (L) 01/01/2020   HCT 32.8 (L) 01/01/2020   MCV 100.6 (H) 01/01/2020   PLT 319 01/01/2020   NEUTROABS 3.5 01/01/2020    ASSESSMENT & PLAN:   Malignant neoplasm of upper-outer quadrant of right breast in female, estrogen receptor positive ([place]) 06/21/2019:Patient palpated a painful right breast lump x3 weeks. Mammogram showed a 2.0cm right breast mass at the 10 o'clock position, a 3.6cm enlarged right axillary lymph node, and a 0.5cm left breast mass. Biopsy showed, in the right breast and axilla(lymph node), IDC, grade 3, HER-2 equivocal by IHC, + by FISH, ER+ 40%, PR - 0%, Ki67 80%,  left breast, fibrocystic changes, no malignancy.Can be followed.  Treatment plan: 1. Neoadjuvant chemotherapy with TCH Perjeta 5 cycles completed 10/05/2019 followed by HerceptinPerjeta versus Kadcylamaintenance for 1 year started 07/12/2019 2. breast conserving surgery if possible with sentinel lymph node study: 12/13/2019: Pathologic complete response, 0/5 lymph nodes negative 3. Followed by adjuvant radiation therapy  4.  Followed by antiestrogen therapy   Genetic counseling Breast MRI on 07/07/19 showed the 2.9cm biopsy-proven malignancy in the right breast and axilla, highly suspicious non-mass enhancement in the lateral right breast spanning 14.5cm, and no left breast malignancy ----------------------------------------------------------------------------------------------------------------------------------------------------------- Pathology counseling: I discussed the final pathology report of the patient provided  a copy of this report. I discussed the margins as well as lymph node surgeries. We also discussed the final staging along with previously performed ER/PR and HER-2/neu testing.   Treatment plan:  Herceptin and Perjeta maintenance and adjuvant radiation She is getting physical therapy to the arm. Complaining of a seroma in the right breast: She has appointment to see her surgeon today. Hot flashes: Gabapentin appears to be helping her. She has appointments to see radiation oncology.  Return to clinic every 3 weeks for Herceptin  Perjeta and every 6 weeks to follow-up with me.    No orders of the defined types were placed in this encounter.  The patient has a good understanding of the overall plan. she agrees with it. she will call with any problems that may develop before the next visit here.  Total time spent: 30 mins including face to face time and time spent for planning, charting and coordination of care  Nicholas Lose, MD 01/01/2020  I, Cloyde Reams Dorshimer, am acting as scribe for Dr. Nicholas Lose.  I have reviewed the above documentation for accuracy and completeness, and I agree with the above.

## 2020-01-01 ENCOUNTER — Inpatient Hospital Stay: Payer: BC Managed Care – PPO

## 2020-01-01 ENCOUNTER — Inpatient Hospital Stay: Payer: BC Managed Care – PPO | Attending: Hematology and Oncology

## 2020-01-01 ENCOUNTER — Other Ambulatory Visit: Payer: Self-pay

## 2020-01-01 ENCOUNTER — Inpatient Hospital Stay (HOSPITAL_BASED_OUTPATIENT_CLINIC_OR_DEPARTMENT_OTHER): Payer: BC Managed Care – PPO | Admitting: Hematology and Oncology

## 2020-01-01 VITALS — BP 124/86

## 2020-01-01 DIAGNOSIS — Z5189 Encounter for other specified aftercare: Secondary | ICD-10-CM | POA: Insufficient documentation

## 2020-01-01 DIAGNOSIS — C50411 Malignant neoplasm of upper-outer quadrant of right female breast: Secondary | ICD-10-CM | POA: Diagnosis not present

## 2020-01-01 DIAGNOSIS — Z170 Estrogen receptor positive status [ER+]: Secondary | ICD-10-CM

## 2020-01-01 DIAGNOSIS — Z23 Encounter for immunization: Secondary | ICD-10-CM | POA: Diagnosis not present

## 2020-01-01 DIAGNOSIS — Z5112 Encounter for antineoplastic immunotherapy: Secondary | ICD-10-CM | POA: Diagnosis not present

## 2020-01-01 DIAGNOSIS — Z95828 Presence of other vascular implants and grafts: Secondary | ICD-10-CM

## 2020-01-01 LAB — CBC WITH DIFFERENTIAL (CANCER CENTER ONLY)
Abs Immature Granulocytes: 0.01 10*3/uL (ref 0.00–0.07)
Basophils Absolute: 0 10*3/uL (ref 0.0–0.1)
Basophils Relative: 1 %
Eosinophils Absolute: 0.3 10*3/uL (ref 0.0–0.5)
Eosinophils Relative: 5 %
HCT: 32.8 % — ABNORMAL LOW (ref 36.0–46.0)
Hemoglobin: 10.9 g/dL — ABNORMAL LOW (ref 12.0–15.0)
Immature Granulocytes: 0 %
Lymphocytes Relative: 34 %
Lymphs Abs: 2.2 10*3/uL (ref 0.7–4.0)
MCH: 33.4 pg (ref 26.0–34.0)
MCHC: 33.2 g/dL (ref 30.0–36.0)
MCV: 100.6 fL — ABNORMAL HIGH (ref 80.0–100.0)
Monocytes Absolute: 0.5 10*3/uL (ref 0.1–1.0)
Monocytes Relative: 7 %
Neutro Abs: 3.5 10*3/uL (ref 1.7–7.7)
Neutrophils Relative %: 53 %
Platelet Count: 319 10*3/uL (ref 150–400)
RBC: 3.26 MIL/uL — ABNORMAL LOW (ref 3.87–5.11)
RDW: 12.9 % (ref 11.5–15.5)
WBC Count: 6.5 10*3/uL (ref 4.0–10.5)
nRBC: 0 % (ref 0.0–0.2)

## 2020-01-01 LAB — CMP (CANCER CENTER ONLY)
ALT: 14 U/L (ref 0–44)
AST: 13 U/L — ABNORMAL LOW (ref 15–41)
Albumin: 3.4 g/dL — ABNORMAL LOW (ref 3.5–5.0)
Alkaline Phosphatase: 64 U/L (ref 38–126)
Anion gap: 11 (ref 5–15)
BUN: 11 mg/dL (ref 6–20)
CO2: 24 mmol/L (ref 22–32)
Calcium: 9.5 mg/dL (ref 8.9–10.3)
Chloride: 107 mmol/L (ref 98–111)
Creatinine: 0.83 mg/dL (ref 0.44–1.00)
GFR, Est AFR Am: >60 mL/min (ref >60)
GFR, Estimated: >60 mL/min (ref >60)
Glucose, Bld: 99 mg/dL (ref 70–99)
Potassium: 3.8 mmol/L (ref 3.5–5.1)
Sodium: 142 mmol/L (ref 135–145)
Total Bilirubin: 0.4 mg/dL (ref 0.3–1.2)
Total Protein: 6.8 g/dL (ref 6.5–8.1)

## 2020-01-01 LAB — PREGNANCY, URINE: Preg Test, Ur: NEGATIVE

## 2020-01-01 MED ORDER — SODIUM CHLORIDE 0.9 % IV SOLN
Freq: Once | INTRAVENOUS | Status: AC
  Filled 2020-01-01: qty 250

## 2020-01-01 MED ORDER — ACETAMINOPHEN 325 MG PO TABS
ORAL_TABLET | ORAL | Status: AC
  Filled 2020-01-01: qty 2

## 2020-01-01 MED ORDER — GABAPENTIN 100 MG PO CAPS: 100.0000 mg | ORAL_CAPSULE | Freq: Two times a day (BID) | ORAL | Status: DC

## 2020-01-01 MED ORDER — SODIUM CHLORIDE 0.9% FLUSH
10.0000 mL | INTRAVENOUS | Status: DC | PRN
  Administered 2020-01-01: 10 mL
  Filled 2020-01-01: qty 10

## 2020-01-01 MED ORDER — ACETAMINOPHEN 325 MG PO TABS
650.0000 mg | ORAL_TABLET | Freq: Once | ORAL | Status: AC
  Administered 2020-01-01: 650 mg via ORAL

## 2020-01-01 MED ORDER — INFLUENZA VAC SPLIT QUAD 0.5 ML IM SUSY
PREFILLED_SYRINGE | INTRAMUSCULAR | Status: AC
  Filled 2020-01-01: qty 0.5

## 2020-01-01 MED ORDER — HEPARIN SOD (PORK) LOCK FLUSH 100 UNIT/ML IV SOLN
500.0000 [IU] | Freq: Once | INTRAVENOUS | Status: AC | PRN
  Administered 2020-01-01: 500 [IU]
  Filled 2020-01-01: qty 5

## 2020-01-01 MED ORDER — DIPHENHYDRAMINE HCL 25 MG PO CAPS
ORAL_CAPSULE | ORAL | Status: AC
  Filled 2020-01-01: qty 2

## 2020-01-01 MED ORDER — INFLUENZA VAC SPLIT QUAD 0.5 ML IM SUSY
0.5000 mL | PREFILLED_SYRINGE | Freq: Once | INTRAMUSCULAR | Status: AC
  Administered 2020-01-01: 0.5 mL via INTRAMUSCULAR

## 2020-01-01 MED ORDER — TRASTUZUMAB-DKST CHEMO 150 MG IV SOLR
6.0000 mg/kg | Freq: Once | INTRAVENOUS | Status: AC
  Administered 2020-01-01: 630 mg via INTRAVENOUS
  Filled 2020-01-01: qty 30

## 2020-01-01 MED ORDER — SODIUM CHLORIDE 0.9 % IV SOLN
420.0000 mg | Freq: Once | INTRAVENOUS | Status: AC
  Administered 2020-01-01: 420 mg via INTRAVENOUS
  Filled 2020-01-01: qty 14

## 2020-01-01 MED ORDER — DIPHENHYDRAMINE HCL 25 MG PO CAPS
50.0000 mg | ORAL_CAPSULE | Freq: Once | ORAL | Status: AC
  Administered 2020-01-01: 50 mg via ORAL

## 2020-01-01 MED ORDER — SODIUM CHLORIDE 0.9% FLUSH
10.0000 mL | Freq: Once | INTRAVENOUS | Status: AC
  Administered 2020-01-01: 10 mL
  Filled 2020-01-01: qty 10

## 2020-01-01 NOTE — Assessment & Plan Note (Signed)
06/21/2019:Patient palpated a painful right breast lump x3 weeks. Mammogram showed a 2.0cm right breast mass at the 10 o'clock position, a 3.6cm enlarged right axillary lymph node, and a 0.5cm left breast mass. Biopsy showed, in the right breast and axilla(lymph node), IDC, grade 3, HER-2 equivocal by IHC, + by FISH, ER+ 40%, PR - 0%, Ki67 80%,  left breast, fibrocystic changes, no malignancy.Can be followed.  Treatment plan: 1. Neoadjuvant chemotherapy with TCH Perjeta 5 cycles completed 10/05/2019 followed by HerceptinPerjeta versus Kadcylamaintenance for 1 year started 07/12/2019 2. breast conserving surgery if possible with sentinel lymph node study: 12/13/2019: Pathologic complete response, 0/5 lymph nodes negative 3. Followed by adjuvant radiation therapy  4.  Followed by antiestrogen therapy   Genetic counseling Breast MRI on 07/07/19 showed the 2.9cm biopsy-proven malignancy in the right breast and axilla, highly suspicious non-mass enhancement in the lateral right breast spanning 14.5cm, and no left breast malignancy ----------------------------------------------------------------------------------------------------------------------------------------------------------- Pathology counseling: I discussed the final pathology report of the patient provided  a copy of this report. I discussed the margins as well as lymph node surgeries. We also discussed the final staging along with previously performed ER/PR and HER-2/neu testing.   Treatment plan:  Herceptin and Perjeta maintenance and adjuvant radiation Return to clinic every 3 weeks for Herceptin Perjeta and every 6 weeks to follow-up with me. 

## 2020-01-01 NOTE — Patient Instructions (Signed)
[facility] Discharge Instructions for Patients Receiving Chemotherapy  Today you received the following chemotherapy agents: trastuzumab, pertuzumab To help prevent nausea and vomiting after your treatment, we encourage you to take your nausea medication as directed.   If you develop nausea and vomiting that is not controlled by your nausea medication, call the clinic.   BELOW ARE SYMPTOMS THAT SHOULD BE REPORTED IMMEDIATELY:  *FEVER GREATER THAN 100.5 F  *CHILLS WITH OR WITHOUT FEVER  NAUSEA AND VOMITING THAT IS NOT CONTROLLED WITH YOUR NAUSEA MEDICATION  *UNUSUAL SHORTNESS OF BREATH  *UNUSUAL BRUISING OR BLEEDING  TENDERNESS IN MOUTH AND THROAT WITH OR WITHOUT PRESENCE OF ULCERS  *URINARY PROBLEMS  *BOWEL PROBLEMS  UNUSUAL RASH Items with * indicate a potential emergency and should be followed up as soon as possible.  Feel free to call the clinic should you have any questions or concerns. The clinic phone number is [phone].  Please show the CHEMO ALERT CARD at check-in to the Emergency Department and triage nurse.   

## 2020-01-01 NOTE — Patient Instructions (Signed)

## 2020-01-01 NOTE — Progress Notes (Signed)
Declined to stay 30 minute observation post-perjeta. Discharged in stable condition.  

## 2020-01-03 ENCOUNTER — Telehealth: Payer: Self-pay | Admitting: Hematology and Oncology

## 2020-01-03 ENCOUNTER — Ambulatory Visit: Payer: BC Managed Care – PPO

## 2020-01-03 NOTE — Telephone Encounter (Signed)
Scheduled per 10/4 los. Called and spoke with pt confirmed added appts

## 2020-01-04 ENCOUNTER — Encounter: Payer: BC Managed Care – PPO | Admitting: Physical Therapy

## 2020-01-04 ENCOUNTER — Encounter: Payer: Self-pay | Admitting: *Deleted

## 2020-01-04 ENCOUNTER — Ambulatory Visit: Payer: BC Managed Care – PPO | Attending: General Surgery

## 2020-01-04 ENCOUNTER — Other Ambulatory Visit: Payer: Self-pay

## 2020-01-04 DIAGNOSIS — Z483 Aftercare following surgery for neoplasm: Secondary | ICD-10-CM | POA: Insufficient documentation

## 2020-01-04 DIAGNOSIS — C50411 Malignant neoplasm of upper-outer quadrant of right female breast: Secondary | ICD-10-CM | POA: Insufficient documentation

## 2020-01-04 DIAGNOSIS — R293 Abnormal posture: Secondary | ICD-10-CM | POA: Diagnosis present

## 2020-01-04 DIAGNOSIS — M25611 Stiffness of right shoulder, not elsewhere classified: Secondary | ICD-10-CM | POA: Insufficient documentation

## 2020-01-04 DIAGNOSIS — Z170 Estrogen receptor positive status [ER+]: Secondary | ICD-10-CM | POA: Diagnosis present

## 2020-01-04 NOTE — Therapy (Signed)
[place], [place], [address] Phone: [phone]   Fax:  [phone]  Physical Therapy Treatment  Patient Details  Name: Heidi Costa MRN: [id number] Date of Birth: 1980-07-11 Referring Provider (PT): [name]   Encounter Date: 01/04/2020   PT End of Session - 01/04/20 1119    Visit Number 3    Number of Visits 10    Date for PT Re-Evaluation 01/25/20    PT Start Time 1010    PT Stop Time 1100    PT Time Calculation (min) 50 min    Activity Tolerance Patient tolerated treatment well    Behavior During Therapy [facility] for tasks assessed/performed           Past Medical History:  Diagnosis Date  . Abnormality of gait 08/02/2012   resolved per patient on 07/12/19  . Anemia    iron taking in the past, not currently  . Anxiety   . Arthritis    spine- cerv.  & arm   . Asthma   . Bulging lumbar disc   . Cancer [facility])    Right Breast-starts chemo on 07/14/19  . Chronic arthralgias of knees and hips 08/02/2012  . Depression    Pt. took self off Celexa one month ago, states she did n't like how it made her feel .  Marland Kitchen Family history of cancer   . GERD (gastroesophageal reflux disease)    diet controlled  . Heart murmur    pt. been told that this is a fact, no echo or cardiac surveillance in the past   . HLD (hyperlipidemia)    no meds, diet controlled  . Hypertension   . Obesity   . Spinal stenosis     Past Surgical History:  Procedure Laterality Date  . ANTERIOR CERVICAL DECOMP/DISCECTOMY FUSION N/A 10/27/2012   Procedure: ANTERIOR CERVICAL DECOMPRESSION/DISCECTOMY FUSION 1 LEVEL;  Surgeon: Faythe Ghee, MD;  Location: [place] [facility];  Service: Neurosurgery;  Laterality: N/A;  ANTERIOR CERVICAL DECOMPRESSION/DISCECTOMY FUSION 1 LEVEL  . BREAST LUMPECTOMY WITH RADIOACTIVE SEED AND SENTINEL LYMPH NODE BIOPSY Right 12/13/2019   Procedure: RIGHT BREAST LUMPECTOMY WITH RADIOACTIVE SEED AND RIGHT  AXILLARY  SENTINEL LYMPH NODE BIOPSY, RIGHT AXILLARY NODE SEED GUIDED EXCISION, BLUE DYE INJECTION;  Surgeon: Rolm Bookbinder, MD;  Location: [place];  Service: General;  Laterality: Right;  PEC BLOCK  . CHALAZION EXCISION     L eye   . PORTACATH PLACEMENT Left 07/13/2019   Procedure: INSERTION PORT-A-CATH WITH ULTRASOUND;  Surgeon: Rolm Bookbinder, MD;  Location: [place];  Service: General;  Laterality: Left;  Marland Kitchen VAGINAL DELIVERY  2002  . WISDOM TOOTH EXTRACTION      There were no vitals filed for this visit.   Subjective Assessment - 01/04/20 1010    Subjective The next day after working on the cording my arm was really sore.  cords are still there.Feel soreness at lateral breast and had fluid drawn off on 01/01/20.  They will drain again today.Chip bag seems to help.    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 It measures 2.3 cm and is located in the upper outer quadrant. She has an axillary node that measures 3.7 cm and was biopsied and is positive. Her cancer is ER positive, PR negative and HER2 positive with a Ki67 of 80%. There is also a 5 mm area on her left breast which will be followed  on MRI. 12/13/19- R lumpectomy and SLNB (1 nodes- negative)    Patient Stated Goals Reduce lymphedema risk and learn post op shoulder ROM HEP    Currently in Pain? Yes    Pain Score 4     Pain Location Arm    Pain Orientation Right    Pain Descriptors / Indicators Aching;Tightness    Pain Radiating Towards Shoulder to wrist with cording    Pain Onset 1 to 4 weeks ago    Pain Frequency Intermittent    Aggravating Factors  moving arm and stretching, when breast fills with fluid has increased pain                       Outpatient [facility]  Lymphedema Life Impact Scale Total Score 7.35 %            [facility] Adult PT  Treatment/Exercise - 01/04/20 0001      Exercises   Exercises Shoulder      Shoulder Exercises: Pulleys   Flexion 2 minutes    ABduction 2 minutes      Shoulder Exercises: Therapy Ball   Flexion Both;10 reps    ABduction Right;5 reps      Manual Therapy   Manual Therapy Edema management;Myofascial release;Manual Lymphatic Drainage (MLD)    Manual therapy comments PROM right shoulder flex, scaption, ER    Myofascial Release to cording from R axilla to wrist with numerous cords palpable    Manual Lymphatic Drainage (MLD) MLD short neck, left axillary, nodes, left inguinal nodes, anterior interaxillary anastomosis, iright axillary inguinal, Right upper extemity to wrist starting proximally and then retracing all steps, also performed in SL to lateral trunk/breast                  PT Education - 01/04/20 1118    Education Details Educated in gentle elbow wrist extension in standing to stretch area of cording    Person(s) Educated Patient    Methods Explanation;Demonstration    Comprehension Returned demonstration               PT Long Term Goals - 12/28/19 1357      PT LONG TERM GOAL #1   Title Patient will demonstrate she has regained full shoulder ROM and function post operatively compared to baselines.    Time 4    Period Weeks    Status On-going      PT LONG TERM GOAL #2   Title Pt will report a 75% improvement in comfort of RUE to allow pt to reach overhead without discomfort from cording.    Time 4    Period Weeks    Status New    Target Date 01/25/20      PT LONG TERM GOAL #3   Title Pt will demonstrate a 75% improvement in scar tissue in area of SLNB scar to allow improved comfort.    Time 4    Period Weeks    Status New    Target Date 01/25/20                 Plan - 01/04/20 1120    Clinical Impression Statement Pt was very sore the day after previous session.  She had seroma drained on 01/01/20  and felt much better however it has  returned and she will have it drained again today.  She had good tolerance for all techniques performed with minimal discomfort.  Still lacking end range of shoulder flexion and with tenderness at incision site    Examination-Activity Limitations Lift;Reach Overhead;Carry    Stability/Clinical Decision Making Stable/Uncomplicated    Rehab Potential Excellent    PT Frequency 2x / week    PT Duration 4 weeks    PT Treatment/Interventions ADLs/Self Care Home Management;Therapeutic exercise;Patient/family education;Manual techniques;Manual lymph drainage;Compression bandaging;Scar mobilization;Passive range of motion;Vasopneumatic Device    PT Next Visit Plan MFR to R cording from axilla to wrist, MLD to R UE following cording, PROM to RUE - pulleys and ball    Consulted and Agree with Plan of Care Patient           Patient will benefit from skilled therapeutic intervention in order to improve the following deficits and impairments:  Postural dysfunction, Decreased range of motion, Decreased knowledge of precautions, Impaired UE functional use, Pain, Increased edema, Increased fascial restricitons  Visit Diagnosis: Aftercare following surgery for neoplasm  Stiffness of right shoulder, not elsewhere classified  Abnormal posture  Malignant neoplasm of upper-outer quadrant of right breast in female, estrogen receptor positive ([place])     Problem List Patient Active Problem List   Diagnosis Date Noted  . Depression, major, single episode, moderate ([place]) 10/17/2019  . Mild persistent asthma without complication 49/82/6415  . Port-A-Cath in place 08/04/2019  . Genetic testing 07/24/2019  . Family history of cancer   . Dysphagia 06/23/2019  . Malignant neoplasm of upper-outer quadrant of right breast in female, estrogen receptor positive ([place]) 06/21/2019  . Tobacco consumption 06/06/2019  . Breast mass, right 06/06/2019  . Sensation of change in body temperature 06/06/2019  . Neuropathic  pain 06/06/2019  . Bite, insect 10/19/2016  . Swelling of eyelid, left 10/19/2016  . Essential hypertension 08/10/2016  . Disturbance of skin sensation 05/08/2013  . Cervical spondylosis with myelopathy 05/08/2013  . Abnormality of gait 08/02/2012  . Chronic arthralgias of knees and hips 08/02/2012    Elsie Ra [facility] 01/04/2020, 11:34 AM  [place], [place], [address] Phone: [phone]   Fax:  [phone]  Name: Heidi Costa MRN: [id number] Date of Birth: 24-Jul-1980

## 2020-01-05 ENCOUNTER — Ambulatory Visit: Payer: BC Managed Care – PPO

## 2020-01-05 DIAGNOSIS — C50411 Malignant neoplasm of upper-outer quadrant of right female breast: Secondary | ICD-10-CM

## 2020-01-05 DIAGNOSIS — R293 Abnormal posture: Secondary | ICD-10-CM

## 2020-01-05 DIAGNOSIS — Z483 Aftercare following surgery for neoplasm: Secondary | ICD-10-CM | POA: Diagnosis not present

## 2020-01-05 DIAGNOSIS — Z170 Estrogen receptor positive status [ER+]: Secondary | ICD-10-CM

## 2020-01-05 DIAGNOSIS — M25611 Stiffness of right shoulder, not elsewhere classified: Secondary | ICD-10-CM

## 2020-01-05 NOTE — Patient Instructions (Signed)
Instructed in wall climb for abe, clasped hands flexion in supine, and wrist ext in various degrees of shoulder abd. Pt given illustrated instructions for HEP

## 2020-01-05 NOTE — Therapy (Signed)
[place], [place], [address] Phone: [phone]   Fax:  [phone]  Physical Therapy Treatment  Patient Details  Name: Heidi Costa MRN: [id number] Date of Birth: 10/14/1980 Referring Provider (PT): Ave Filter Date: 01/05/2020   PT End of Session - 01/05/20 0901    Visit Number 4    Number of Visits 10    Date for PT Re-Evaluation 01/25/20    PT Start Time 0804    PT Stop Time 3474    PT Time Calculation (min) 53 min    Activity Tolerance Patient tolerated treatment well    Behavior During Therapy [facility] for tasks assessed/performed           Past Medical History:  Diagnosis Date  . Abnormality of gait 08/02/2012   resolved per patient on 07/12/19  . Anemia    iron taking in the past, not currently  . Anxiety   . Arthritis    spine- cerv.  & arm   . Asthma   . Bulging lumbar disc   . Cancer [facility])    Right Breast-starts chemo on 07/14/19  . Chronic arthralgias of knees and hips 08/02/2012  . Depression    Pt. took self off Celexa one month ago, states she did n't like how it made her feel .  Marland Kitchen Family history of cancer   . GERD (gastroesophageal reflux disease)    diet controlled  . Heart murmur    pt. been told that this is a fact, no echo or cardiac surveillance in the past   . HLD (hyperlipidemia)    no meds, diet controlled  . Hypertension   . Obesity   . Spinal stenosis     Past Surgical History:  Procedure Laterality Date  . ANTERIOR CERVICAL DECOMP/DISCECTOMY FUSION N/A 10/27/2012   Procedure: ANTERIOR CERVICAL DECOMPRESSION/DISCECTOMY FUSION 1 LEVEL;  Surgeon: Faythe Ghee, MD;  Location: [place] [facility];  Service: Neurosurgery;  Laterality: N/A;  ANTERIOR CERVICAL DECOMPRESSION/DISCECTOMY FUSION 1 LEVEL  . BREAST LUMPECTOMY WITH RADIOACTIVE SEED AND SENTINEL LYMPH NODE BIOPSY Right 12/13/2019   Procedure: RIGHT BREAST LUMPECTOMY WITH RADIOACTIVE SEED AND RIGHT  AXILLARY  SENTINEL LYMPH NODE BIOPSY, RIGHT AXILLARY NODE SEED GUIDED EXCISION, BLUE DYE INJECTION;  Surgeon: Rolm Bookbinder, MD;  Location: [place];  Service: General;  Laterality: Right;  PEC BLOCK  . CHALAZION EXCISION     L eye   . PORTACATH PLACEMENT Left 07/13/2019   Procedure: INSERTION PORT-A-CATH WITH ULTRASOUND;  Surgeon: Rolm Bookbinder, MD;  Location: [place];  Service: General;  Laterality: Left;  Marland Kitchen VAGINAL DELIVERY  2002  . WISDOM TOOTH EXTRACTION      There were no vitals filed for this visit.   Subjective Assessment - 01/05/20 0801    Subjective Got arm drained of 30 cc yesterday.  They think my body will absorb the rest.  It feels better overall.  I was not really sore after last visit.  Still can't reach up high like I normally could.    Currently in Pain? No/denies    Pain Onset 1 to 4 weeks ago                       Outpatient Rehab from 12/28/2019 in [place]  Lymphedema Life Impact Scale Total Score 7.35 %            [facility] Adult PT Treatment/Exercise - 01/05/20 0001  Shoulder Exercises: Standing   Shoulder ABduction Weight (lbs) wall slides x 10      Shoulder Exercises: Pulleys   Flexion 2 minutes    ABduction 2 minutes      Shoulder Exercises: Therapy Ball   Flexion Both;10 reps    ABduction Right;5 reps      Manual Therapy   Manual Therapy Edema management;Myofascial release;Manual Lymphatic Drainage (MLD)    Manual therapy comments PROM right shoulder flex, scaption, ER    Myofascial Release to cording from R axilla to wrist with numerous cords palpable    Manual Lymphatic Drainage (MLD) MLD short neck, left axillary, nodes, left inguinal nodes, anterior interaxillary anastomosis, iright axillary inguinal, Right upper extemity to wrist starting proximally and then retracing all steps, also performed in SL to lateral trunk/breast                  PT Education -  01/05/20 0900    Education Details Pt educated in standing wall slides for abd, supine flexion ROM with clasped hands and supine abd with wrist extension in various ranges.  Discussed pt purchasing a sports ype bra with higher sides to provide some compression over area of seroma while using her foam pack.    Person(s) Educated Patient    Methods Explanation;Demonstration    Comprehension Verbalized understanding;Returned demonstration               PT Long Term Goals - 12/28/19 1357      PT LONG TERM GOAL #1   Title Patient will demonstrate she has regained full shoulder ROM and function post operatively compared to baselines.    Time 4    Period Weeks    Status On-going      PT LONG TERM GOAL #2   Title Pt will report a 75% improvement in comfort of RUE to allow pt to reach overhead without discomfort from cording.    Time 4    Period Weeks    Status New    Target Date 01/25/20      PT LONG TERM GOAL #3   Title Pt will demonstrate a 75% improvement in scar tissue in area of SLNB scar to allow improved comfort.    Time 4    Period Weeks    Status New    Target Date 01/25/20                 Plan - 01/05/20 0902    Clinical Impression Statement Less soreness after previous session.  Felt better around incision after having seroma drained of 30 cc yesterday.  Tenderness at incision site is less.  Cording still very palpable especially with arm abd while on pulleys, but ROM is less painfull    Stability/Clinical Decision Making Stable/Uncomplicated    Rehab Potential Excellent    PT Frequency 2x / week    PT Duration 4 weeks           Patient will benefit from skilled therapeutic intervention in order to improve the following deficits and impairments:  Postural dysfunction, Decreased range of motion, Decreased knowledge of precautions, Impaired UE functional use, Pain, Increased edema, Increased fascial restricitons  Visit Diagnosis: Aftercare following surgery  for neoplasm  Stiffness of right shoulder, not elsewhere classified  Abnormal posture  Malignant neoplasm of upper-outer quadrant of right breast in female, estrogen receptor positive ([place])     Problem List Patient Active Problem List   Diagnosis Date Noted  . Depression, major, single  episode, moderate ([place]) 10/17/2019  . Mild persistent asthma without complication 69/48/5462  . Port-A-Cath in place 08/04/2019  . Genetic testing 07/24/2019  . Family history of cancer   . Dysphagia 06/23/2019  . Malignant neoplasm of upper-outer quadrant of right breast in female, estrogen receptor positive ([place]) 06/21/2019  . Tobacco consumption 06/06/2019  . Breast mass, right 06/06/2019  . Sensation of change in body temperature 06/06/2019  . Neuropathic pain 06/06/2019  . Bite, insect 10/19/2016  . Swelling of eyelid, left 10/19/2016  . Essential hypertension 08/10/2016  . Disturbance of skin sensation 05/08/2013  . Cervical spondylosis with myelopathy 05/08/2013  . Abnormality of gait 08/02/2012  . Chronic arthralgias of knees and hips 08/02/2012    [facility] 01/05/2020, 9:09 AM  [place], [place], [address] Phone: [phone]   Fax:  [phone]  Name: Heidi Costa MRN: [id number] Date of Birth: 02-07-1981

## 2020-01-10 ENCOUNTER — Ambulatory Visit: Payer: BC Managed Care – PPO

## 2020-01-11 ENCOUNTER — Encounter: Payer: Self-pay | Admitting: Radiation Oncology

## 2020-01-11 ENCOUNTER — Other Ambulatory Visit: Payer: Self-pay

## 2020-01-11 ENCOUNTER — Ambulatory Visit
Admission: RE | Admit: 2020-01-11 | Discharge: 2020-01-11 | Disposition: A | Discharge status: Home / Self Care | Payer: BC Managed Care – PPO | Source: Ambulatory Visit | Attending: Radiation Oncology | Admitting: Radiation Oncology

## 2020-01-11 DIAGNOSIS — Z170 Estrogen receptor positive status [ER+]: Secondary | ICD-10-CM

## 2020-01-11 NOTE — Progress Notes (Signed)
Radiation Oncology         (336) [phone] ________________________________   Name: Heidi Costa        MRN: [id number]  Date of Service: 01/11/2020 DOB: [birth year]  SW:NIOEVOJJ, Heidi Argue, MD  Nicholas Lose, MD     REFERRING PHYSICIAN: Nicholas Lose, MD   DIAGNOSIS: The encounter diagnosis was Malignant neoplasm of upper-outer quadrant of right breast in female, estrogen receptor positive ([place]).   HISTORY OF PRESENT ILLNESS: Heidi Costa is a 39 y.o. female originally seen in the multidisciplinary breast clinic for a new diagnosis of right breast cancer. he patient was noted to have a mass in the right breast and was seen by her PCP.  She was sent for diagnostic imaging of the breast at [facility] and by ultrasound there was a mass measuring 1.8 x 2 x 1.4 cm in the right breast at 10:00 which was hypoechoic and correlated to a diagnostic mammography finding.  In the right axilla she had a 3.6 x 3.7 x 2.9 cm enlarged lymph node with a circumscribed margin without fatty hilum there is also been a 5 mm lesion seen in the left breast on diagnostic mammography and ultrasonography revealed normal fibroglandular tissue without correlate.  She underwent a biopsy on 06/19/2019 the left breast lower inner quadrant was biopsied stereotactically and revealed mild fibrocystic change.  The right breast mass at 10:00 was biopsied and revealed a grade 3 invasive ductal carcinoma, her tumor was ER positive, PR negative HER-2 amplified with a Ki 67 of 80%, and her lymph node in the right axilla was also biopsied and consistent with ductal carcinoma.  She subsequently underwent neoadjuvant chemotherapy between April 16 and October 07, 2019.  She has continued on Perjeta, and on 12/13/2019 underwent right lumpectomy with targeted axillary lymph node dissection. Final pathology revealed no residual invasive carcinoma in the right breast specimen, additional medial margin was also negative for disease.  She had 5 sampled  lymph nodes that were all negative for disease.  She is seen today via MyChart to discuss next steps for adjuvant therapy.  She has plans to continue Perjeta under the care of Dr. Lindi Adie.     PREVIOUS RADIATION THERAPY: No   PAST MEDICAL HISTORY:  Past Medical History:  Diagnosis Date  . Abnormality of gait 08/02/2012   resolved per patient on 07/12/19  . Anemia    iron taking in the past, not currently  . Anxiety   . Arthritis    spine- cerv.  & arm   . Asthma   . Bulging lumbar disc   . Cancer [facility])    Right Breast-starts chemo on 07/14/19  . Chronic arthralgias of knees and hips 08/02/2012  . Depression    Pt. took self off Celexa one month ago, states she did n't like how it made her feel .  Marland Kitchen Family history of cancer   . GERD (gastroesophageal reflux disease)    diet controlled  . Heart murmur    pt. been told that this is a fact, no echo or cardiac surveillance in the past   . HLD (hyperlipidemia)    no meds, diet controlled  . Hypertension   . Obesity   . Spinal stenosis        PAST SURGICAL HISTORY: Past Surgical History:  Procedure Laterality Date  . ANTERIOR CERVICAL DECOMP/DISCECTOMY FUSION N/A 10/27/2012   Procedure: ANTERIOR CERVICAL DECOMPRESSION/DISCECTOMY FUSION 1 LEVEL;  Surgeon: Faythe Ghee, MD;  Location: [facility];  Service: Neurosurgery;  Laterality: N/A;  ANTERIOR CERVICAL DECOMPRESSION/DISCECTOMY FUSION 1 LEVEL  . BREAST LUMPECTOMY WITH RADIOACTIVE SEED AND SENTINEL LYMPH NODE BIOPSY Right 12/13/2019   Procedure: RIGHT BREAST LUMPECTOMY WITH RADIOACTIVE SEED AND RIGHT AXILLARY  SENTINEL LYMPH NODE BIOPSY, RIGHT AXILLARY NODE SEED GUIDED EXCISION, BLUE DYE INJECTION;  Surgeon: Rolm Bookbinder, MD;  Location: [place];  Service: General;  Laterality: Right;  PEC BLOCK  . CHALAZION EXCISION     L eye   . PORTACATH PLACEMENT Left 07/13/2019   Procedure: INSERTION PORT-A-CATH WITH ULTRASOUND;  Surgeon: Rolm Bookbinder, MD;   Location: [place];  Service: General;  Laterality: Left;  Marland Kitchen VAGINAL DELIVERY  2002  . WISDOM TOOTH EXTRACTION       FAMILY HISTORY:  Family History  Problem Relation Age of Onset  . Osteoarthritis Mother   . Hypertension Mother   . Diabetes Mother   . Hypertension Father   . Gout Father   . Stroke Father 56       embolic  . Diabetes Half-Brother   . Hypertension Half-Brother   . Heart failure Half-Brother 45  . Kidney disease Half-Brother   . Diabetes Paternal Grandfather   . Cancer Paternal Grandfather 76       breast cancer     SOCIAL HISTORY:  reports that she quit smoking about 6 months ago. Her smoking use included cigarettes. She has a 10.00 pack-year smoking history. She has never used smokeless tobacco. She reports current alcohol use. She reports current drug use. Drug: Marijuana. The patient is married and lives in [place]. She's a Pharmacist, hospital for a preschool.  ALLERGIES: Aspirin   MEDICATIONS:  Current Outpatient Medications  Medication Sig Dispense Refill  . acetaminophen (TYLENOL) 500 MG tablet Take 500-1,000 mg by mouth daily as needed for pain.     Marland Kitchen albuterol (VENTOLIN HFA) 108 (90 Base) MCG/ACT inhaler Inhale 2 puffs into the lungs every 6 (six) hours as needed for wheezing or shortness of breath. 18 g 2  . amLODipine (NORVASC) 5 MG tablet Take 1 tablet (5 mg total) by mouth daily. 90 tablet 1  . buPROPion (WELLBUTRIN XL) 300 MG 24 hr tablet Take 1 tablet (300 mg total) by mouth daily. 1 tab qam x 7 days then increase to 2 pills 90 tablet 1  . fluticasone (FLOVENT HFA) 44 MCG/ACT inhaler Inhale 1 puff into the lungs 2 (two) times daily. 1 Inhaler 5  . gabapentin (NEURONTIN) 100 MG capsule Take 1 capsule (100 mg total) by mouth 2 (two) times daily.    Marland Kitchen oxyCODONE (OXY IR/ROXICODONE) 5 MG immediate release tablet Take 1 tablet (5 mg total) by mouth every 6 (six) hours as needed. 10 tablet 0   No current facility-administered medications for this encounter.      REVIEW OF SYSTEMS: On review of systems, the patient reports that she is doing well since chemo. She had seroma accumulation that required aspiration on two occasions but she has done very well since then. She is working with PT for ROM and to avoid lymphedema. No other complaints are verbalized.     PHYSICAL EXAM:  Vitals unable to be assessed due to encounter type.  In general this is a well appearing African American female in no acute distress. She's alert and oriented x4 and appropriate throughout the examination. Cardiopulmonary assessment is negative for acute distress and she exhibits normal effort. Bilateral breast exam is deferred.    ECOG = 0  0 - Asymptomatic (Fully  active, able to carry on all predisease activities without restriction)  1 - Symptomatic but completely ambulatory (Restricted in physically strenuous activity but ambulatory and able to carry out work of a light or sedentary nature. For example, light housework, office work)  2 - Symptomatic, <50% in bed during the day (Ambulatory and capable of all self care but unable to carry out any work activities. Up and about more than 50% of waking hours)  3 - Symptomatic, >50% in bed, but not bedbound (Capable of only limited self-care, confined to bed or chair 50% or more of waking hours)  4 - Bedbound (Completely disabled. Cannot carry on any self-care. Totally confined to bed or chair)  5 - Death   Eustace Pen MM, Creech RH, Tormey DC, et al. [phone]). "Toxicity and response criteria of the [facility]". [place] Oncol. 5 (6): 649-55    LABORATORY DATA:  Lab Results  Component Value Date   WBC 6.5 01/01/2020   HGB 10.9 (L) 01/01/2020   HCT 32.8 (L) 01/01/2020   MCV 100.6 (H) 01/01/2020   PLT 319 01/01/2020   Lab Results  Component Value Date   NA 142 01/01/2020   K 3.8 01/01/2020   CL 107 01/01/2020   CO2 24 01/01/2020   Lab Results  Component Value Date   ALT 14 01/01/2020    AST 13 (L) 01/01/2020   ALKPHOS 64 01/01/2020   BILITOT 0.4 01/01/2020      RADIOGRAPHY: NM Sentinel Node Inj-No Rpt (Breast)  Result Date: 12/13/2019 Sulfur colloid was injected by the nuclear medicine technologist for melanoma sentinel node.       IMPRESSION/PLAN: 1. 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 Dr. Lisbeth Renshaw discusses the final pathology findings and reviews the nature of node positive breast disease. Dr. Lisbeth Renshaw reviews the rationale for external radiotherapy to the breast and regional nodes followed by antiestrogen therapy. We discussed the risks, benefits, short, and long term effects of radiotherapy, and the patient is interested in proceeding. Dr. Lisbeth Renshaw discusses the delivery and logistics of radiotherapy and recommends 6 1/2  weeks of radiotherapy to the right breast and regional nodes. We reviewed consent via MyChart and she will sign this tomorrow at the time of her simulation. 2. Contraceptive Counseling. Dr. Lindi Adie has recommended copper IUD, but she has a negative pregnancy test from last Monday and knows to use condoms or abstinence during radiotherapy to avoid pregnancy and the known complications if she were to become pregnant.   This encounter was provided by telemedicine platform MyChart.  The patient has provided two factor identification and has given verbal consent for this type of encounter and has been advised to only accept a meeting of this type in a secure network environment. The time spent during this encounter was 60 minutes including preparation, discussion, and coordination of the patient's care. The attendants for this meeting include  Dr. Lisbeth Renshaw, Hayden Pedro  and Sharene Butters.  During the encounter, Dr. Lisbeth Renshaw, and Hayden Pedro were located at [facility] Radiation Oncology Department.  ZITA OZIMEK was located at home.    The above documentation  reflects my direct findings during this shared patient visit. Please see the separate note by Dr. Lisbeth Renshaw on this date for the remainder of the patient's plan of care.    Carola Rhine, PAC

## 2020-01-12 ENCOUNTER — Ambulatory Visit: Payer: BC Managed Care – PPO

## 2020-01-12 ENCOUNTER — Ambulatory Visit
Admission: RE | Admit: 2020-01-12 | Payer: BC Managed Care – PPO | Source: Ambulatory Visit | Admitting: Radiation Oncology

## 2020-01-12 DIAGNOSIS — Z483 Aftercare following surgery for neoplasm: Secondary | ICD-10-CM | POA: Diagnosis not present

## 2020-01-12 DIAGNOSIS — M25611 Stiffness of right shoulder, not elsewhere classified: Secondary | ICD-10-CM

## 2020-01-12 DIAGNOSIS — C50411 Malignant neoplasm of upper-outer quadrant of right female breast: Secondary | ICD-10-CM | POA: Insufficient documentation

## 2020-01-12 DIAGNOSIS — R293 Abnormal posture: Secondary | ICD-10-CM

## 2020-01-12 DIAGNOSIS — Z510 Encounter for antineoplastic radiation therapy: Secondary | ICD-10-CM | POA: Insufficient documentation

## 2020-01-12 DIAGNOSIS — Z170 Estrogen receptor positive status [ER+]: Secondary | ICD-10-CM

## 2020-01-12 NOTE — Therapy (Signed)
[place], [place], [address] Phone: [phone]   Fax:  [phone]  Physical Therapy Treatment  Patient Details  Name: Heidi Costa MRN: [id number] Date of Birth: 08-20-1980 Referring Provider (PT): Ave Filter Date: 01/12/2020   PT End of Session - 01/12/20 0848    Visit Number 5    Number of Visits 10    Date for PT Re-Evaluation 01/25/20    PT Start Time 0806    PT Stop Time 0853    PT Time Calculation (min) 47 min    Activity Tolerance Patient tolerated treatment well    Behavior During Therapy [facility] for tasks assessed/performed           Past Medical History:  Diagnosis Date  . Abnormality of gait 08/02/2012   resolved per patient on 07/12/19  . Anemia    iron taking in the past, not currently  . Anxiety   . Arthritis    spine- cerv.  & arm   . Asthma   . Bulging lumbar disc   . Cancer [facility])    Right Breast-starts chemo on 07/14/19  . Chronic arthralgias of knees and hips 08/02/2012  . Depression    Pt. took self off Celexa one month ago, states she did n't like how it made her feel .  Marland Kitchen Family history of cancer   . GERD (gastroesophageal reflux disease)    diet controlled  . Heart murmur    pt. been told that this is a fact, no echo or cardiac surveillance in the past   . HLD (hyperlipidemia)    no meds, diet controlled  . Hypertension   . Obesity   . Spinal stenosis     Past Surgical History:  Procedure Laterality Date  . ANTERIOR CERVICAL DECOMP/DISCECTOMY FUSION N/A 10/27/2012   Procedure: ANTERIOR CERVICAL DECOMPRESSION/DISCECTOMY FUSION 1 LEVEL;  Surgeon: Faythe Ghee, MD;  Location: [place] [facility];  Service: Neurosurgery;  Laterality: N/A;  ANTERIOR CERVICAL DECOMPRESSION/DISCECTOMY FUSION 1 LEVEL  . BREAST LUMPECTOMY WITH RADIOACTIVE SEED AND SENTINEL LYMPH NODE BIOPSY Right 12/13/2019   Procedure: RIGHT BREAST LUMPECTOMY WITH RADIOACTIVE SEED AND RIGHT  AXILLARY  SENTINEL LYMPH NODE BIOPSY, RIGHT AXILLARY NODE SEED GUIDED EXCISION, BLUE DYE INJECTION;  Surgeon: Rolm Bookbinder, MD;  Location: [place];  Service: General;  Laterality: Right;  PEC BLOCK  . CHALAZION EXCISION     L eye   . PORTACATH PLACEMENT Left 07/13/2019   Procedure: INSERTION PORT-A-CATH WITH ULTRASOUND;  Surgeon: Rolm Bookbinder, MD;  Location: [place];  Service: General;  Laterality: Left;  Marland Kitchen VAGINAL DELIVERY  2002  . WISDOM TOOTH EXTRACTION      There were no vitals filed for this visit.   Subjective Assessment - 01/12/20 0804    Subjective Seroma hasn't filled back up but sometimes it hurts.  It was achy when I woke up yesterday it was achy but its better today. Still feel cording at elbow, but not as bad.  Still can't achieve full shoulder ROM.  Have not worn chip pack since last week.Have radiation simulation today and start radiation next week.    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 It measures 2.3 cm and is located in the upper outer quadrant. She has an axillary node that measures 3.7 cm and was biopsied and is positive. Her cancer is ER positive, PR negative and HER2 positive with  a Ki67 of 80%. There is also a 5 mm area on her left breast which will be followed on MRI. 12/13/19- R lumpectomy and SLNB (1 nodes- negative)    Currently in Pain? No/denies    Pain Orientation Right                       Outpatient [facility]  Lymphedema Life Impact Scale Total Score 7.35 %            [facility] Adult PT Treatment/Exercise - 01/12/20 0001      Shoulder Exercises: Pulleys   Flexion 2 minutes    ABduction 2 minutes      Shoulder Exercises: Therapy Ball   Flexion Both;10 reps    ABduction Right;10 reps      Manual Therapy   Manual Therapy Edema management;Myofascial release;Manual Lymphatic  Drainage (MLD)    Manual therapy comments PROM right shoulder flex, scaption, ER    Myofascial Release to cording from R axilla to wrist with numerous cords palpable    Manual Lymphatic Drainage (MLD) MLD short neck, left axillary, nodes, left inguinal nodes, anterior interaxillary anastomosis, iright axillary inguinal, Right upper extemity to wrist starting proximally and then retracing all steps, also performed in SL to lateral trunk/breast                       PT Long Term Goals - 12/28/19 1357      PT LONG TERM GOAL #1   Title Patient will demonstrate she has regained full shoulder ROM and function post operatively compared to baselines.    Time 4    Period Weeks    Status On-going      PT LONG TERM GOAL #2   Title Pt will report a 75% improvement in comfort of RUE to allow pt to reach overhead without discomfort from cording.    Time 4    Period Weeks    Status New    Target Date 01/25/20      PT LONG TERM GOAL #3   Title Pt will demonstrate a 75% improvement in scar tissue in area of SLNB scar to allow improved comfort.    Time 4    Period Weeks    Status New    Target Date 01/25/20                 Plan - 01/12/20 0849    Clinical Impression Statement Pt has not had any more noticeable fluid in region of incision since having seroma drained last week.  cording is still present but not as tender or painful.    Examination-Activity Limitations Lift;Reach Overhead;Carry    Stability/Clinical Decision Making Stable/Uncomplicated    Rehab Potential Excellent    PT Frequency 2x / week    PT Duration 4 weeks    PT Treatment/Interventions ADLs/Self Care Home Management;Therapeutic exercise;Patient/family education;Manual techniques;Manual lymph drainage;Compression bandaging;Scar mobilization;Passive range of motion;Vasopneumatic Device    PT Next Visit Plan MFR to R cording from axilla to wrist, MLD to R UE following cording, PROM to RUE - cont pulleys and  ball.  Review sports bra and see if she has purchased    Consulted and Agree with Plan of Care Patient           Patient will benefit from skilled therapeutic intervention in order to improve the following deficits and impairments:  Postural dysfunction, Decreased range of motion, Decreased  knowledge of precautions, Impaired UE functional use, Pain, Increased edema, Increased fascial restricitons  Visit Diagnosis: Aftercare following surgery for neoplasm  Stiffness of right shoulder, not elsewhere classified  Abnormal posture  Malignant neoplasm of upper-outer quadrant of right breast in female, estrogen receptor positive ([place])     Problem List Patient Active Problem List   Diagnosis Date Noted  . Depression, major, single episode, moderate ([place]) 10/17/2019  . Mild persistent asthma without complication 76/22/6333  . Port-A-Cath in place 08/04/2019  . Genetic testing 07/24/2019  . Family history of cancer   . Dysphagia 06/23/2019  . Malignant neoplasm of upper-outer quadrant of right breast in female, estrogen receptor positive ([place]) 06/21/2019  . Tobacco consumption 06/06/2019  . Breast mass, right 06/06/2019  . Sensation of change in body temperature 06/06/2019  . Neuropathic pain 06/06/2019  . Bite, insect 10/19/2016  . Swelling of eyelid, left 10/19/2016  . Essential hypertension 08/10/2016  . Disturbance of skin sensation 05/08/2013  . Cervical spondylosis with myelopathy 05/08/2013  . Abnormality of gait 08/02/2012  . Chronic arthralgias of knees and hips 08/02/2012    Claris Pong, PT 01/12/2020, 8:59 AM  [place], [place], [address] Phone: [phone]   Fax:  [phone]  Name: Heidi Costa MRN: [id number] Date of Birth: [birth year]

## 2020-01-17 ENCOUNTER — Other Ambulatory Visit: Payer: Self-pay

## 2020-01-17 ENCOUNTER — Ambulatory Visit: Payer: BC Managed Care – PPO

## 2020-01-17 DIAGNOSIS — Z170 Estrogen receptor positive status [ER+]: Secondary | ICD-10-CM

## 2020-01-17 DIAGNOSIS — M25611 Stiffness of right shoulder, not elsewhere classified: Secondary | ICD-10-CM

## 2020-01-17 DIAGNOSIS — Z483 Aftercare following surgery for neoplasm: Secondary | ICD-10-CM

## 2020-01-17 DIAGNOSIS — R293 Abnormal posture: Secondary | ICD-10-CM

## 2020-01-17 DIAGNOSIS — C50411 Malignant neoplasm of upper-outer quadrant of right female breast: Secondary | ICD-10-CM

## 2020-01-17 NOTE — Patient Instructions (Signed)
   Access Code: [email or web address]

## 2020-01-17 NOTE — Therapy (Signed)
[place], [place], [address] Phone: [phone]   Fax:  [phone]  Physical Therapy Treatment  Patient Details  Name: Heidi Costa MRN: [id number] Date of Birth: 11-13-1980 Referring Provider (PT): Ave Filter Date: 01/17/2020   PT End of Session - 01/17/20 1007    Visit Number 6    Number of Visits 10    Date for PT Re-Evaluation 01/25/20    PT Start Time 0904    PT Stop Time 0950    PT Time Calculation (min) 46 min    Activity Tolerance Patient tolerated treatment well    Behavior During Therapy [facility] for tasks assessed/performed           Past Medical History:  Diagnosis Date  . Abnormality of gait 08/02/2012   resolved per patient on 07/12/19  . Anemia    iron taking in the past, not currently  . Anxiety   . Arthritis    spine- cerv.  & arm   . Asthma   . Bulging lumbar disc   . Cancer [facility])    Right Breast-starts chemo on 07/14/19  . Chronic arthralgias of knees and hips 08/02/2012  . Depression    Pt. took self off Celexa one month ago, states she did n't like how it made her feel .  Marland Kitchen Family history of cancer   . GERD (gastroesophageal reflux disease)    diet controlled  . Heart murmur    pt. been told that this is a fact, no echo or cardiac surveillance in the past   . HLD (hyperlipidemia)    no meds, diet controlled  . Hypertension   . Obesity   . Spinal stenosis     Past Surgical History:  Procedure Laterality Date  . ANTERIOR CERVICAL DECOMP/DISCECTOMY FUSION N/A 10/27/2012   Procedure: ANTERIOR CERVICAL DECOMPRESSION/DISCECTOMY FUSION 1 LEVEL;  Surgeon: Faythe Ghee, MD;  Location: [place] [facility];  Service: Neurosurgery;  Laterality: N/A;  ANTERIOR CERVICAL DECOMPRESSION/DISCECTOMY FUSION 1 LEVEL  . BREAST LUMPECTOMY WITH RADIOACTIVE SEED AND SENTINEL LYMPH NODE BIOPSY Right 12/13/2019   Procedure: RIGHT BREAST LUMPECTOMY WITH RADIOACTIVE SEED AND RIGHT  AXILLARY  SENTINEL LYMPH NODE BIOPSY, RIGHT AXILLARY NODE SEED GUIDED EXCISION, BLUE DYE INJECTION;  Surgeon: Rolm Bookbinder, MD;  Location: [place];  Service: General;  Laterality: Right;  PEC BLOCK  . CHALAZION EXCISION     L eye   . PORTACATH PLACEMENT Left 07/13/2019   Procedure: INSERTION PORT-A-CATH WITH ULTRASOUND;  Surgeon: Rolm Bookbinder, MD;  Location: [place];  Service: General;  Laterality: Left;  Marland Kitchen VAGINAL DELIVERY  2002  . WISDOM TOOTH EXTRACTION      There were no vitals filed for this visit.   Subjective Assessment - 01/17/20 0903    Subjective Had radiation simulation last Fri and start on Monday next week.Seroma area still doing well and has not returned. Still have some cording but it isn't hurting as bad along arm as it was.    Patient Stated Goals reaching to extremes,    Currently in Pain? No/denies              [facility] PT Assessment - 01/17/20 0001      AROM   Right Shoulder Extension 75 Degrees    Right Shoulder Flexion 168 Degrees    Right Shoulder ABduction 179 Degrees    Right Shoulder Internal Rotation 50 Degrees    Right Shoulder External Rotation 84  Degrees    Left Shoulder Flexion 169 Degrees    Left Shoulder ABduction 180 Degrees                   Outpatient Re[facility] from [facility]28/[facility]  Lymphedema Life Impact Scale Total Score 7.35 %            [facility] Adult PT Treatment/Exercise - 01/17/20 0001      Shoulder Exercises: Standing   Extension Weight (lbs) yellow x 10    Row Weight (lbs) yellow x 10    Other Standing Exercises shoulder flexion,scaption abd x 10, no resistance      Shoulder Exercises: Pulleys   Flexion 2 minutes    ABduction 2 minutes      Manual Therapy   Manual therapy comments PROM right shoulder flex, scaption, ER    Myofascial Release to cording from R axilla to wrist with numerous cords palpable    Manual Lymphatic Drainage (MLD) MLD short  neck, left axillary, nodes, left inguinal nodes, anterior interaxillary anastomosis, iright axillary inguinal, Right upper extemity to wrist starting proximally and then retracing all steps, also performed in SL to lateral trunk/breast                  PT Education - 01/17/20 0953    Education Details pt was educated in standing flex, scaption and abd to shoulder height x 10, and scapular retraction and shouder ext with yellow band x 10.  pt was given illustrated and written instructions    Person(s) Educated Patient    Methods Demonstration;Verbal cues;Handout    Comprehension Returned demonstration;Verbalized understanding               PT Long Term Goals - 12/28/19 1357      PT LONG TERM GOAL #1   Title Patient will demonstrate she has regained full shoulder ROM and function post operatively compared to baselines.    Time 4    Period Weeks    Status On-going      PT LONG TERM GOAL #2   Title Pt will report a 75% improvement in comfort of RUE to allow pt to reach overhead without discomfort from cording.    Time 4    Period Weeks    Status New    Target Date 01/25/20      PT LONG TERM GOAL #3   Title Pt will demonstrate a 75% improvement in scar tissue in area of SLNB scar to allow improved comfort.    Time 4    Period Weeks    Status New    Target Date 01/25/20                 Plan - 01/17/20 1007    Clinical Impression Statement Pt has had excellent improvement in shoulder AROM.  She continues with mild cording mainly noted at elbow, but minimal complaints with this.  Area of seroma still doing well.  pt had good tolerance for new exercises added today.    Examination-Activity Limitations Lift;Reach Overhead;Carry    Stability/Clinical Decision Making Stable/Uncomplicated    Rehab Potential Excellent    PT Frequency 2x / week    PT Duration 4 weeks    PT Treatment/Interventions ADLs/Self Care Home Management;Therapeutic exercise;Patient/family  education;Manual techniques;Manual lymph drainage;Compression bandaging;Scar mobilization;Passive range of motion;Vasopneumatic Device    PT Next Visit Plan Review exs instructed today, Release techniques to cording, MLD    PT Home Exercise Plan standing  scap retraction yellow x 10, extension yellow x 10, standing 3 way shoulder no resistance x 10    Consulted and Agree with Plan of Care Patient           Patient will benefit from skilled therapeutic intervention in order to improve the following deficits and impairments:  Postural dysfunction, Decreased range of motion, Decreased knowledge of precautions, Impaired UE functional use, Pain, Increased edema, Increased fascial restricitons  Visit Diagnosis: Aftercare following surgery for neoplasm  Stiffness of right shoulder, not elsewhere classified  Abnormal posture  Malignant neoplasm of upper-outer quadrant of right breast in female, estrogen receptor positive ([place])     Problem List Patient Active Problem List   Diagnosis Date Noted  . Depression, major, single episode, moderate ([place]) 10/17/2019  . Mild persistent asthma without complication 92/01/9416  . Port-A-Cath in place 08/04/2019  . Genetic testing 07/24/2019  . Family history of cancer   . Dysphagia 06/23/2019  . Malignant neoplasm of upper-outer quadrant of right breast in female, estrogen receptor positive ([place]) 06/21/2019  . Tobacco consumption 06/06/2019  . Breast mass, right 06/06/2019  . Sensation of change in body temperature 06/06/2019  . Neuropathic pain 06/06/2019  . Bite, insect 10/19/2016  . Swelling of eyelid, left 10/19/2016  . Essential hypertension 08/10/2016  . Disturbance of skin sensation 05/08/2013  . Cervical spondylosis with myelopathy 05/08/2013  . Abnormality of gait 08/02/2012  . Chronic arthralgias of knees and hips 08/02/2012    Elsie Ra [facility] 01/17/2020, 10:13 AM  [place], [place], [address] Phone: [phone]   Fax:  [phone]  Name: NYESHA CLIFF MRN: [id number] Date of Birth: 1980-09-05

## 2020-01-18 ENCOUNTER — Encounter: Payer: Self-pay | Admitting: *Deleted

## 2020-01-19 ENCOUNTER — Other Ambulatory Visit: Payer: Self-pay

## 2020-01-19 ENCOUNTER — Ambulatory Visit: Payer: BC Managed Care – PPO

## 2020-01-19 DIAGNOSIS — Z483 Aftercare following surgery for neoplasm: Secondary | ICD-10-CM | POA: Diagnosis not present

## 2020-01-19 DIAGNOSIS — C50411 Malignant neoplasm of upper-outer quadrant of right female breast: Secondary | ICD-10-CM | POA: Diagnosis not present

## 2020-01-19 DIAGNOSIS — Z170 Estrogen receptor positive status [ER+]: Secondary | ICD-10-CM

## 2020-01-19 DIAGNOSIS — M25611 Stiffness of right shoulder, not elsewhere classified: Secondary | ICD-10-CM

## 2020-01-19 DIAGNOSIS — R293 Abnormal posture: Secondary | ICD-10-CM

## 2020-01-19 NOTE — Therapy (Signed)
[place], [place], [address] Phone: [phone]   Fax:  [phone]  Physical Therapy Treatment  Patient Details  Name: Heidi Costa MRN: [id number] Date of Birth: 04-Nov-1980 Referring Provider (PT): Ave Filter Date: 01/19/2020   PT End of Session - 01/19/20 0950    Visit Number 7    Number of Visits 10    Date for PT Re-Evaluation 01/25/20    PT Start Time 0904    PT Stop Time 0950    PT Time Calculation (min) 46 min    Activity Tolerance Patient tolerated treatment well    Behavior During Therapy [facility] for tasks assessed/performed           Past Medical History:  Diagnosis Date  . Abnormality of gait 08/02/2012   resolved per patient on 07/12/19  . Anemia    iron taking in the past, not currently  . Anxiety   . Arthritis    spine- cerv.  & arm   . Asthma   . Bulging lumbar disc   . Cancer [facility])    Right Breast-starts chemo on 07/14/19  . Chronic arthralgias of knees and hips 08/02/2012  . Depression    Pt. took self off Celexa one month ago, states she did n't like how it made her feel .  Marland Kitchen Family history of cancer   . GERD (gastroesophageal reflux disease)    diet controlled  . Heart murmur    pt. been told that this is a fact, no echo or cardiac surveillance in the past   . HLD (hyperlipidemia)    no meds, diet controlled  . Hypertension   . Obesity   . Spinal stenosis     Past Surgical History:  Procedure Laterality Date  . ANTERIOR CERVICAL DECOMP/DISCECTOMY FUSION N/A 10/27/2012   Procedure: ANTERIOR CERVICAL DECOMPRESSION/DISCECTOMY FUSION 1 LEVEL;  Surgeon: Faythe Ghee, MD;  Location: [place] [facility];  Service: Neurosurgery;  Laterality: N/A;  ANTERIOR CERVICAL DECOMPRESSION/DISCECTOMY FUSION 1 LEVEL  . BREAST LUMPECTOMY WITH RADIOACTIVE SEED AND SENTINEL LYMPH NODE BIOPSY Right 12/13/2019   Procedure: RIGHT BREAST LUMPECTOMY WITH RADIOACTIVE SEED AND RIGHT  AXILLARY  SENTINEL LYMPH NODE BIOPSY, RIGHT AXILLARY NODE SEED GUIDED EXCISION, BLUE DYE INJECTION;  Surgeon: Rolm Bookbinder, MD;  Location: [place];  Service: General;  Laterality: Right;  PEC BLOCK  . CHALAZION EXCISION     L eye   . PORTACATH PLACEMENT Left 07/13/2019   Procedure: INSERTION PORT-A-CATH WITH ULTRASOUND;  Surgeon: Rolm Bookbinder, MD;  Location: [place];  Service: General;  Laterality: Left;  Marland Kitchen VAGINAL DELIVERY  2002  . WISDOM TOOTH EXTRACTION      There were no vitals filed for this visit.   Subjective Assessment - 01/19/20 0904    Subjective The pain/tightness down my arm is much more faint. Nearly 85% better.  Incision is feeling better.  Haven't noticed any swelling.                       [facility]patient [facility]  Lymphedema Life Impact Scale Total Score 7.35 %            [facility] Adult PT Treatment/Exercise - 01/19/20 0001      Shoulder Exercises: Standing   Theraband Level (Shoulder Extension) Level 1 (Yellow)    Extension Weight (lbs) yellow x 10    Theraband Level (Shoulder Row) Level 1 (  Yellow)    Row Weight (lbs) yellow x 10      Shoulder Exercises: Pulleys   Flexion 2 minutes    ABduction 2 minutes      Shoulder Exercises: Therapy Ball   Flexion Right;Left;10 reps    ABduction Right;Left;10 reps    Scaption Right;Left;10 reps      Manual Therapy   Manual therapy comments PROM right shoulder flex, scaption, ER    Myofascial Release to cording from R axilla to wrist with numerous cords palpable    Manual Lymphatic Drainage (MLD) MLD short neck, left axillary, nodes, left inguinal nodes, anterior interaxillary anastomosis, iright axillary inguinal, Right upper extemity to wrist starting proximally and then retracing all steps, also performed in SL to lateral trunk/breast                  PT Education - 01/19/20 0906    Education Details Reviewed  exercises and reinforced               PT Long Term Goals - 01/19/20 0953      PT LONG TERM GOAL #1   Title Patient will demonstrate she has regained full shoulder ROM and function post operatively compared to baselines.    Time 4    Period Weeks    Status On-going      PT LONG TERM GOAL #2   Title Pt will report a 75% improvement in comfort of RUE to allow pt to reach overhead without discomfort from cording.    Baseline 85% better 01/19/20    Time 4    Period Weeks    Status Achieved      PT LONG TERM GOAL #3   Title Pt will demonstrate a 75% improvement in scar tissue in area of SLNB scar to allow improved comfort.    Time 4    Period Weeks                 Plan - 01/19/20 0950    Clinical Impression Statement Cording continues to improve and pts pain is 85% better.  Mild cording still noted at elbow    Examination-Activity Limitations Lift;Reach Overhead;Carry    Stability/Clinical Decision Making Stable/Uncomplicated    Rehab Potential Excellent    PT Frequency 2x / week    PT Duration 4 weeks    PT Treatment/Interventions ADLs/Self Care Home Management;Therapeutic exercise;Patient/family education;Manual techniques;Manual lymph drainage;Compression bandaging;Scar mobilization;Passive range of motion;Vasopneumatic Device    PT Next Visit Plan Review HEP again, MFR to cording, check goals ? place on hold for a week or 2    PT Home Exercise Plan standing scap retraction yellow x 10, extension yellow x 10, standing 3 way shoulder no resistance x 10    Consulted and Agree with Plan of Care Patient           Patient will benefit from skilled therapeutic intervention in order to improve the following deficits and impairments:  Postural dysfunction, Decreased range of motion, Decreased knowledge of precautions, Impaired UE functional use, Pain, Increased edema, Increased fascial restricitons  Visit Diagnosis: Aftercare following surgery for neoplasm  Stiffness  of right shoulder, not elsewhere classified  Abnormal posture  Malignant neoplasm of upper-outer quadrant of right breast in female, estrogen receptor positive ([place])     Problem List Patient Active Problem List   Diagnosis Date Noted  . Depression, major, single episode, moderate ([place]) 10/17/2019  . Mild persistent asthma without complication 86/57/8469  . Port-A-Cath in place  08/04/2019  . Genetic testing 07/24/2019  . Family history of cancer   . Dysphagia 06/23/2019  . Malignant neoplasm of upper-outer quadrant of right breast in female, estrogen receptor positive ([place]) 06/21/2019  . Tobacco consumption 06/06/2019  . Breast mass, right 06/06/2019  . Sensation of change in body temperature 06/06/2019  . Neuropathic pain 06/06/2019  . Bite, insect 10/19/2016  . Swelling of eyelid, left 10/19/2016  . Essential hypertension 08/10/2016  . Disturbance of skin sensation 05/08/2013  . Cervical spondylosis with myelopathy 05/08/2013  . Abnormality of gait 08/02/2012  . Chronic arthralgias of knees and hips 08/02/2012    Elsie Ra [facility] 01/19/2020, 9:58 AM  [place], [place], [address] Phone: [phone]   Fax:  [phone]  Name: Heidi Costa MRN: [id number] Date of Birth: 01-05-1981

## 2020-01-22 ENCOUNTER — Ambulatory Visit
Admission: RE | Admit: 2020-01-22 | Discharge: 2020-01-22 | Disposition: A | Discharge status: Home / Self Care | Payer: BC Managed Care – PPO | Source: Ambulatory Visit | Attending: Radiation Oncology | Admitting: Radiation Oncology

## 2020-01-22 ENCOUNTER — Inpatient Hospital Stay: Payer: BC Managed Care – PPO

## 2020-01-22 ENCOUNTER — Other Ambulatory Visit: Payer: Self-pay

## 2020-01-22 VITALS — BP 138/84 | HR 83 | Temp 97.7°F | Resp 17

## 2020-01-22 DIAGNOSIS — C50411 Malignant neoplasm of upper-outer quadrant of right female breast: Secondary | ICD-10-CM | POA: Diagnosis not present

## 2020-01-22 DIAGNOSIS — Z170 Estrogen receptor positive status [ER+]: Secondary | ICD-10-CM

## 2020-01-22 LAB — PREGNANCY, URINE: Preg Test, Ur: NEGATIVE

## 2020-01-22 MED ORDER — ACETAMINOPHEN 325 MG PO TABS
650.0000 mg | ORAL_TABLET | Freq: Once | ORAL | Status: AC
  Administered 2020-01-22: 650 mg via ORAL

## 2020-01-22 MED ORDER — TRASTUZUMAB-DKST CHEMO 150 MG IV SOLR
6.0000 mg/kg | Freq: Once | INTRAVENOUS | Status: AC
  Administered 2020-01-22: 630 mg via INTRAVENOUS
  Filled 2020-01-22: qty 30

## 2020-01-22 MED ORDER — SODIUM CHLORIDE 0.9 % IV SOLN
420.0000 mg | Freq: Once | INTRAVENOUS | Status: AC
  Administered 2020-01-22: 420 mg via INTRAVENOUS
  Filled 2020-01-22: qty 14

## 2020-01-22 MED ORDER — HEPARIN SOD (PORK) LOCK FLUSH 100 UNIT/ML IV SOLN
500.0000 [IU] | Freq: Once | INTRAVENOUS | Status: AC | PRN
  Administered 2020-01-22: 500 [IU]
  Filled 2020-01-22: qty 5

## 2020-01-22 MED ORDER — SODIUM CHLORIDE 0.9% FLUSH
10.0000 mL | INTRAVENOUS | Status: DC | PRN
  Administered 2020-01-22: 10 mL
  Filled 2020-01-22: qty 10

## 2020-01-22 MED ORDER — DIPHENHYDRAMINE HCL 25 MG PO CAPS
50.0000 mg | ORAL_CAPSULE | Freq: Once | ORAL | Status: AC
  Administered 2020-01-22: 50 mg via ORAL

## 2020-01-22 MED ORDER — SODIUM CHLORIDE 0.9 % IV SOLN
Freq: Once | INTRAVENOUS | Status: AC
  Filled 2020-01-22: qty 250

## 2020-01-22 NOTE — Patient Instructions (Signed)
[facility] Discharge Instructions for Patients Receiving Chemotherapy  Today you received the following chemotherapy agents: trastuzumab, pertuzumab To help prevent nausea and vomiting after your treatment, we encourage you to take your nausea medication as directed.   If you develop nausea and vomiting that is not controlled by your nausea medication, call the clinic.   BELOW ARE SYMPTOMS THAT SHOULD BE REPORTED IMMEDIATELY:  *FEVER GREATER THAN 100.5 F  *CHILLS WITH OR WITHOUT FEVER  NAUSEA AND VOMITING THAT IS NOT CONTROLLED WITH YOUR NAUSEA MEDICATION  *UNUSUAL SHORTNESS OF BREATH  *UNUSUAL BRUISING OR BLEEDING  TENDERNESS IN MOUTH AND THROAT WITH OR WITHOUT PRESENCE OF ULCERS  *URINARY PROBLEMS  *BOWEL PROBLEMS  UNUSUAL RASH Items with * indicate a potential emergency and should be followed up as soon as possible.  Feel free to call the clinic should you have any questions or concerns. The clinic phone number is [phone].  Please show the CHEMO ALERT CARD at check-in to the Emergency Department and triage nurse.   

## 2020-01-22 NOTE — Progress Notes (Signed)
Declined to stay 30 minute observation post-perjeta. Discharged in stable condition.

## 2020-01-23 ENCOUNTER — Ambulatory Visit
Admission: RE | Admit: 2020-01-23 | Discharge: 2020-01-23 | Disposition: A | Discharge status: Home / Self Care | Payer: BC Managed Care – PPO | Source: Ambulatory Visit | Attending: Radiation Oncology | Admitting: Radiation Oncology

## 2020-01-23 ENCOUNTER — Other Ambulatory Visit: Payer: Self-pay

## 2020-01-23 DIAGNOSIS — C50411 Malignant neoplasm of upper-outer quadrant of right female breast: Secondary | ICD-10-CM | POA: Diagnosis not present

## 2020-01-24 ENCOUNTER — Ambulatory Visit
Admission: RE | Admit: 2020-01-24 | Discharge: 2020-01-24 | Disposition: A | Discharge status: Home / Self Care | Payer: BC Managed Care – PPO | Source: Ambulatory Visit | Attending: Radiation Oncology | Admitting: Radiation Oncology

## 2020-01-24 ENCOUNTER — Other Ambulatory Visit: Payer: Self-pay

## 2020-01-24 ENCOUNTER — Ambulatory Visit: Payer: BC Managed Care – PPO

## 2020-01-24 DIAGNOSIS — C50411 Malignant neoplasm of upper-outer quadrant of right female breast: Secondary | ICD-10-CM | POA: Diagnosis not present

## 2020-01-25 ENCOUNTER — Other Ambulatory Visit: Payer: Self-pay

## 2020-01-25 ENCOUNTER — Ambulatory Visit
Admission: RE | Admit: 2020-01-25 | Discharge: 2020-01-25 | Disposition: A | Discharge status: Home / Self Care | Payer: BC Managed Care – PPO | Source: Ambulatory Visit | Attending: Radiation Oncology | Admitting: Radiation Oncology

## 2020-01-25 DIAGNOSIS — C50411 Malignant neoplasm of upper-outer quadrant of right female breast: Secondary | ICD-10-CM | POA: Diagnosis not present

## 2020-01-26 ENCOUNTER — Ambulatory Visit: Payer: BC Managed Care – PPO

## 2020-01-26 DIAGNOSIS — Z483 Aftercare following surgery for neoplasm: Secondary | ICD-10-CM | POA: Diagnosis not present

## 2020-01-26 DIAGNOSIS — Z170 Estrogen receptor positive status [ER+]: Secondary | ICD-10-CM

## 2020-01-26 DIAGNOSIS — R293 Abnormal posture: Secondary | ICD-10-CM

## 2020-01-26 DIAGNOSIS — C50411 Malignant neoplasm of upper-outer quadrant of right female breast: Secondary | ICD-10-CM

## 2020-01-26 DIAGNOSIS — M25611 Stiffness of right shoulder, not elsewhere classified: Secondary | ICD-10-CM

## 2020-01-26 NOTE — Therapy (Signed)
[place], [place], [address] Phone: [phone]   Fax:  [phone]  Physical Therapy Treatment  Patient Details  Name: Heidi Costa MRN: [id number] Date of Birth: [birth year] Referring Provider (PT): [name]   Encounter Date: 01/26/2020   PT End of Session - 01/26/20 1249    Visit Number 8    Number of Visits 13    Date for PT Re-Evaluation 03/08/20    PT Start Time 1005    PT Stop Time 1050    PT Time Calculation (min) 45 min    Activity Tolerance Patient tolerated treatment well    Behavior During Therapy [facility] for tasks assessed/performed           Past Medical History:  Diagnosis Date  . Abnormality of gait 08/02/2012   resolved per patient on 07/12/19  . Anemia    iron taking in the past, not currently  . Anxiety   . Arthritis    spine- cerv.  & arm   . Asthma   . Bulging lumbar disc   . Cancer [facility])    Right Breast-starts chemo on 07/14/19  . Chronic arthralgias of knees and hips 08/02/2012  . Depression    Pt. took self off Celexa one month ago, states she did n't like how it made her feel .  Marland Kitchen Family history of cancer   . GERD (gastroesophageal reflux disease)    diet controlled  . Heart murmur    pt. been told that this is a fact, no echo or cardiac surveillance in the past   . HLD (hyperlipidemia)    no meds, diet controlled  . Hypertension   . Obesity   . Spinal stenosis     Past Surgical History:  Procedure Laterality Date  . ANTERIOR CERVICAL DECOMP/DISCECTOMY FUSION N/A 10/27/2012   Procedure: ANTERIOR CERVICAL DECOMPRESSION/DISCECTOMY FUSION 1 LEVEL;  Surgeon: Faythe Ghee, MD;  Location: [place] [facility];  Service: Neurosurgery;  Laterality: N/A;  ANTERIOR CERVICAL DECOMPRESSION/DISCECTOMY FUSION 1 LEVEL  . BREAST LUMPECTOMY WITH RADIOACTIVE SEED AND SENTINEL LYMPH NODE BIOPSY Right 12/13/2019   Procedure: RIGHT BREAST LUMPECTOMY WITH RADIOACTIVE SEED AND RIGHT  AXILLARY  SENTINEL LYMPH NODE BIOPSY, RIGHT AXILLARY NODE SEED GUIDED EXCISION, BLUE DYE INJECTION;  Surgeon: Rolm Bookbinder, MD;  Location: [place];  Service: General;  Laterality: Right;  PEC BLOCK  . CHALAZION EXCISION     L eye   . PORTACATH PLACEMENT Left 07/13/2019   Procedure: INSERTION PORT-A-CATH WITH ULTRASOUND;  Surgeon: Rolm Bookbinder, MD;  Location: [place];  Service: General;  Laterality: Left;  Marland Kitchen VAGINAL DELIVERY  2002  . WISDOM TOOTH EXTRACTION      There were no vitals filed for this visit.   Subjective Assessment - 01/26/20 1004    Subjective Have started radiation and skin is doing well.  Still having chemo until April 1x/ month. ROM is still doing well, and I am feeling really good. Return to work Monday 8-4. Incision area is feeling softer.    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 It measures 2.3 cm and is located in the upper outer quadrant. She has an axillary node that measures 3.7 cm and was biopsied and is positive. Her cancer is ER positive, PR negative and HER2 positive with a Ki67 of 80%. There is also a 5 mm area on her left breast which will be followed on MRI.  12/13/19- R lumpectomy and SLNB (1 nodes- negative)    Currently in Pain? No/denies    Effect of Pain on Daily Activities not as hard to reachup now              [facility] PT Assessment - 01/26/20 0001      AROM   Right Shoulder Extension 80 Degrees    Right Shoulder Flexion 168 Degrees    Right Shoulder ABduction 180 Degrees    Right Shoulder Internal Rotation 52 Degrees    Right Shoulder External Rotation 100 Degrees             LYMPHEDEMA/ONCOLOGY QUESTIONNAIRE - 01/26/20 0001      Right Upper Extremity Lymphedema   10 cm Proximal to Olecranon Process 40 cm    Olecranon Process 30.6 cm    10 cm Proximal to Ulnar Styloid Process 23 cm    Just Proximal to Ulnar Styloid Process 18.5 cm     Across Hand at [employer] 21.3 cm    At [place] of 2nd Digit 6.2 cm                Outpatient Rehab from 12/28/2019 [facility]  Lymphedema Life Impact Scale Total Score Not given            [facility] Adult PT Treatment/Exercise - 01/26/20 0001      Shoulder Exercises: Standing   External Rotation 10 reps;Right;Left    Theraband Level (Shoulder External Rotation) Level 1 (Yellow)    Theraband Level (Shoulder Extension) Level 1 (Yellow)    Extension Weight (lbs) yellow x 10    Theraband Level (Shoulder Row) Level 1 (Yellow)    Row Weight (lbs) yellow x 10      Shoulder Exercises: Pulleys   Flexion 2 minutes    ABduction 2 minutes      Manual Therapy   Manual therapy comments PROM right shoulder flex, scaption, ER    Edema Management applied TG soft to see if it might make medial arm tenderness better.    Myofascial Release to forearm and mid upper arm to areas of tenderness                  PT Education - 01/26/20 1248    Education Details Educated in towel stretch for IR behind back               PT Long Term Goals - 01/26/20 1014      PT LONG TERM GOAL #1   Title Patient will demonstrate she has regained full shoulder ROM and function post operatively compared to baselines.    Baseline achieved all except IR behind back    Time 4    Period Weeks    Status Partially Met    Target Date 03/08/20      PT LONG TERM GOAL #2   Title Pt will report a 75% improvement in comfort of RUE to allow pt to reach overhead without discomfort from cording.    Baseline 85% better 01/19/20    Time 4    Period Weeks    Status Achieved      PT LONG TERM GOAL #3   Title Pt will demonstrate a 75% improvement in scar tissue in area of SLNB scar to allow improved comfort.    Baseline 100% better    Time 4    Period Weeks    Status Achieved      PT LONG TERM  GOAL #4   Title Pt will maintain full ROM, and decreased cording as  she continues radiation    Time 6    Period Weeks    Status New    Target Date 03/08/20                 Plan - 01/26/20 1250    Clinical Impression Statement Pts. ROM is WNL except for decreased IR behind the back and she has no measureable swelling in arm.  SLNB incision is now soft except 1 very small area at most medial part.  She is compliant with HEP and returns to work next week..  Suggested she continue PT every other week until she completes radiation to be sure her ROM continues to be maintained.  She is still tender at medial arm and forearm and she was given TG soft for this    Examination-Activity Limitations Lift;Reach Overhead;Carry    Stability/Clinical Decision Making Stable/Uncomplicated    Rehab Potential Excellent    PT Frequency Biweekly    PT Duration 6 weeks    PT Treatment/Interventions ADLs/Self Care Home Management;Therapeutic exercise;Patient/family education;Manual techniques;Manual lymph drainage;Compression bandaging;Scar mobilization;Passive range of motion;Vasopneumatic Device    PT Next Visit Plan reassess ROM esp IR behind back, Continue MFR and PROM prn    PT Home Exercise Plan standing scap retraction yellow x 10, extension yellow x 10, standing 3 way shoulder no resistance x 10, IR with towel stretch    Consulted and Agree with Plan of Care Patient           Patient will benefit from skilled therapeutic intervention in order to improve the following deficits and impairments:  Postural dysfunction, Decreased range of motion, Decreased knowledge of precautions, Impaired UE functional use, Pain, Increased edema, Increased fascial restricitons  Visit Diagnosis: Aftercare following surgery for neoplasm  Stiffness of right shoulder, not elsewhere classified  Abnormal posture  Malignant neoplasm of upper-outer quadrant of right breast in female, estrogen receptor positive ([place])     Problem List Patient Active Problem List   Diagnosis Date  Noted  . Depression, major, single episode, moderate ([place]) 10/17/2019  . Mild persistent asthma without complication 01/75/1025  . Port-A-Cath in place 08/04/2019  . Genetic testing 07/24/2019  . Family history of cancer   . Dysphagia 06/23/2019  . Malignant neoplasm of upper-outer quadrant of right breast in female, estrogen receptor positive ([place]) 06/21/2019  . Tobacco consumption 06/06/2019  . Breast mass, right 06/06/2019  . Sensation of change in body temperature 06/06/2019  . Neuropathic pain 06/06/2019  . Bite, insect 10/19/2016  . Swelling of eyelid, left 10/19/2016  . Essential hypertension 08/10/2016  . Disturbance of skin sensation 05/08/2013  . Cervical spondylosis with myelopathy 05/08/2013  . Abnormality of gait 08/02/2012  . Chronic arthralgias of knees and hips 08/02/2012    Claris Pong, PT 01/26/2020, 12:58 PM  [place], [place], [address] Phone: [phone]   Fax:  [phone]  Name: Heidi Costa MRN: [id number] Date of Birth: 1980/06/02

## 2020-01-29 ENCOUNTER — Other Ambulatory Visit: Payer: Self-pay

## 2020-01-29 ENCOUNTER — Ambulatory Visit
Admission: RE | Admit: 2020-01-29 | Discharge: 2020-01-29 | Disposition: A | Discharge status: Home / Self Care | Payer: BC Managed Care – PPO | Source: Ambulatory Visit | Attending: Radiation Oncology | Admitting: Radiation Oncology

## 2020-01-29 DIAGNOSIS — Z170 Estrogen receptor positive status [ER+]: Secondary | ICD-10-CM | POA: Insufficient documentation

## 2020-01-29 DIAGNOSIS — Z510 Encounter for antineoplastic radiation therapy: Secondary | ICD-10-CM | POA: Diagnosis not present

## 2020-01-29 DIAGNOSIS — C50411 Malignant neoplasm of upper-outer quadrant of right female breast: Secondary | ICD-10-CM | POA: Insufficient documentation

## 2020-01-29 MED ORDER — RADIAPLEXRX EX GEL: Freq: Once | CUTANEOUS | Status: AC

## 2020-01-29 MED ORDER — ALRA NON-METALLIC DEODORANT (RAD-ONC)
1.0000 "application " | Freq: Once | TOPICAL | Status: AC
  Administered 2020-01-29: 1 via TOPICAL

## 2020-01-29 NOTE — Progress Notes (Signed)
Pt here for patient teaching.  Pt given Radiation and You booklet, skin care instructions, Alra deodorant and Radiaplex gel.  Reviewed areas of pertinence such as fatigue, hair loss, skin changes, breast tenderness and breast swelling . Pt able to give teach back of to pat skin and use unscented/gentle soap,apply Radiaplex bid, avoid applying anything to skin within 4 hours of treatment, avoid wearing an under wire bra and to use an electric razor if they must shave. Pt verbalizes understanding of information given and will contact nursing with any questions or concerns.     Vedanshi Massaro M. Venie Montesinos RN, BSN      

## 2020-01-30 ENCOUNTER — Ambulatory Visit
Admission: RE | Admit: 2020-01-30 | Discharge: 2020-01-30 | Disposition: A | Discharge status: Home / Self Care | Payer: BC Managed Care – PPO | Source: Ambulatory Visit | Attending: Radiation Oncology | Admitting: Radiation Oncology

## 2020-01-30 DIAGNOSIS — C50411 Malignant neoplasm of upper-outer quadrant of right female breast: Secondary | ICD-10-CM | POA: Diagnosis not present

## 2020-01-31 ENCOUNTER — Other Ambulatory Visit: Payer: Self-pay

## 2020-01-31 ENCOUNTER — Ambulatory Visit
Admission: RE | Admit: 2020-01-31 | Discharge: 2020-01-31 | Disposition: A | Discharge status: Home / Self Care | Payer: BC Managed Care – PPO | Source: Ambulatory Visit | Attending: Radiation Oncology | Admitting: Radiation Oncology

## 2020-01-31 DIAGNOSIS — C50411 Malignant neoplasm of upper-outer quadrant of right female breast: Secondary | ICD-10-CM | POA: Diagnosis not present

## 2020-02-01 ENCOUNTER — Ambulatory Visit
Admission: RE | Admit: 2020-02-01 | Discharge: 2020-02-01 | Disposition: A | Discharge status: Home / Self Care | Payer: BC Managed Care – PPO | Source: Ambulatory Visit | Attending: Radiation Oncology | Admitting: Radiation Oncology

## 2020-02-01 ENCOUNTER — Other Ambulatory Visit: Payer: Self-pay

## 2020-02-01 DIAGNOSIS — C50411 Malignant neoplasm of upper-outer quadrant of right female breast: Secondary | ICD-10-CM | POA: Diagnosis not present

## 2020-02-02 ENCOUNTER — Ambulatory Visit
Admission: RE | Admit: 2020-02-02 | Discharge: 2020-02-02 | Disposition: A | Discharge status: Home / Self Care | Payer: BC Managed Care – PPO | Source: Ambulatory Visit | Attending: Radiation Oncology | Admitting: Radiation Oncology

## 2020-02-02 DIAGNOSIS — C50411 Malignant neoplasm of upper-outer quadrant of right female breast: Secondary | ICD-10-CM | POA: Diagnosis not present

## 2020-02-05 ENCOUNTER — Other Ambulatory Visit: Payer: Self-pay

## 2020-02-05 ENCOUNTER — Ambulatory Visit
Admission: RE | Admit: 2020-02-05 | Discharge: 2020-02-05 | Disposition: A | Discharge status: Home / Self Care | Payer: BC Managed Care – PPO | Source: Ambulatory Visit | Attending: Radiation Oncology | Admitting: Radiation Oncology

## 2020-02-05 DIAGNOSIS — C50411 Malignant neoplasm of upper-outer quadrant of right female breast: Secondary | ICD-10-CM | POA: Diagnosis not present

## 2020-02-06 ENCOUNTER — Other Ambulatory Visit: Payer: Self-pay

## 2020-02-06 ENCOUNTER — Ambulatory Visit
Admission: RE | Admit: 2020-02-06 | Discharge: 2020-02-06 | Disposition: A | Discharge status: Home / Self Care | Payer: BC Managed Care – PPO | Source: Ambulatory Visit | Attending: Radiation Oncology | Admitting: Radiation Oncology

## 2020-02-06 DIAGNOSIS — C50411 Malignant neoplasm of upper-outer quadrant of right female breast: Secondary | ICD-10-CM | POA: Diagnosis not present

## 2020-02-07 ENCOUNTER — Ambulatory Visit
Admission: RE | Admit: 2020-02-07 | Discharge: 2020-02-07 | Disposition: A | Discharge status: Home / Self Care | Payer: BC Managed Care – PPO | Source: Ambulatory Visit | Attending: Radiation Oncology | Admitting: Radiation Oncology

## 2020-02-07 DIAGNOSIS — C50411 Malignant neoplasm of upper-outer quadrant of right female breast: Secondary | ICD-10-CM | POA: Diagnosis not present

## 2020-02-08 ENCOUNTER — Other Ambulatory Visit: Payer: Self-pay

## 2020-02-08 ENCOUNTER — Ambulatory Visit
Admission: RE | Admit: 2020-02-08 | Discharge: 2020-02-08 | Disposition: A | Discharge status: Home / Self Care | Payer: BC Managed Care – PPO | Source: Ambulatory Visit | Attending: Radiation Oncology | Admitting: Radiation Oncology

## 2020-02-08 ENCOUNTER — Ambulatory Visit: Payer: BC Managed Care – PPO | Attending: General Surgery

## 2020-02-08 DIAGNOSIS — C50411 Malignant neoplasm of upper-outer quadrant of right female breast: Secondary | ICD-10-CM | POA: Diagnosis not present

## 2020-02-09 ENCOUNTER — Ambulatory Visit: Payer: BC Managed Care – PPO | Admitting: Radiation Oncology

## 2020-02-09 ENCOUNTER — Ambulatory Visit
Admission: RE | Admit: 2020-02-09 | Discharge: 2020-02-09 | Disposition: A | Discharge status: Home / Self Care | Payer: BC Managed Care – PPO | Source: Ambulatory Visit | Attending: Radiation Oncology | Admitting: Radiation Oncology

## 2020-02-09 DIAGNOSIS — C50411 Malignant neoplasm of upper-outer quadrant of right female breast: Secondary | ICD-10-CM | POA: Diagnosis not present

## 2020-02-11 NOTE — Progress Notes (Signed)
Patient Care Team: Patient, [name] as PCP - General (General Practice) Heidi Germany, RN as Oncology Nurse Navigator Heidi Kaufmann, RN as Oncology Nurse Navigator Heidi Bookbinder, MD as Consulting Physician (General Surgery) Heidi Lose, MD as Consulting Physician (Hematology and Oncology) Heidi Rudd, MD as Consulting Physician (Radiation Oncology)  DIAGNOSIS:    ICD-10-CM   1. Malignant neoplasm of upper-outer quadrant of right breast in female, estrogen receptor positive ([place])  C50.411    Z17.0     SUMMARY OF ONCOLOGIC HISTORY: Oncology History  Malignant neoplasm of upper-outer quadrant of right breast in female, estrogen receptor positive ([place])  06/21/2019 Initial Diagnosis   Patient palpated a painful right breast lump x3 weeks. Mammogram showed a 2.0cm right breast mass at the 10 o'clock position, a 3.6cm enlarged right axillary lymph node, and a 0.5cm left breast mass. Biopsy showed, in the right breast and axilla, IDC, grade 3, HER-2 equivocal by IHC, + by FISH, ER+ 40%, PR - 0%, Ki67 80%, and in the left breast, fibrocystic changes, no malignancy.    07/04/2019 Cancer Staging   Staging form: Breast, AJCC 8th Edition - Clinical stage from 07/04/2019: Stage IIB (cT2, cN1(f), cM0, G3, ER+, PR-, HER2+) - Signed by Heidi Lose, MD on 07/05/2019   07/14/2019 - 10/07/2019 Chemotherapy   The patient had dexamethasone (DECADRON) 4 MG tablet, 4 mg (100 % of original dose 4 mg), Oral, Daily, 1 of 1 cycle, Start date: 07/05/2019, End date: 08/04/2019 Dose modification: 4 mg (original dose 4 mg, Cycle 0) palonosetron (ALOXI) injection 0.25 mg, 0.25 mg, Intravenous,  Once, 5 of 5 cycles Administration: 0.25 mg (07/14/2019), 0.25 mg (08/04/2019), 0.25 mg (10/05/2019), 0.25 mg (08/25/2019), 0.25 mg (09/14/2019) pegfilgrastim-jmdb (FULPHILA) injection 6 mg, 6 mg, Subcutaneous,  Once, 5 of 5 cycles Administration: 6 mg (07/17/2019), 6 mg (08/07/2019), 6 mg (10/07/2019), 6 mg (08/29/2019), 6 mg  (09/16/2019) CARBOplatin (PARAPLATIN) 700 mg in sodium chloride 0.9 % 250 mL chemo infusion, 700 mg (100 % of original dose 700 mg), Intravenous,  Once, 5 of 5 cycles Dose modification: 700 mg (original dose 700 mg, Cycle 1, Reason: Provider Judgment), 600 mg (original dose 700 mg, Cycle 2, Reason: Dose not tolerated) Administration: 700 mg (07/14/2019), 600 mg (08/04/2019), 600 mg (10/05/2019), 600 mg (08/25/2019), 600 mg (09/14/2019) DOCEtaxel (TAXOTERE) 170 mg in sodium chloride 0.9 % 250 mL chemo infusion, 75 mg/m2 = 170 mg, Intravenous,  Once, 5 of 5 cycles Dose modification: 65 mg/m2 (original dose 75 mg/m2, Cycle 2, Reason: Dose not tolerated) Administration: 170 mg (07/14/2019), 150 mg (08/04/2019), 150 mg (10/05/2019), 150 mg (08/25/2019), 150 mg (09/14/2019) fosaprepitant (EMEND) 150 mg in sodium chloride 0.9 % 145 mL IVPB, 150 mg, Intravenous,  Once, 5 of 5 cycles Administration: 150 mg (07/14/2019), 150 mg (08/04/2019), 150 mg (10/05/2019), 150 mg (08/25/2019), 150 mg (09/14/2019) pertuzumab (PERJETA) 840 mg in sodium chloride 0.9 % 250 mL chemo infusion, 840 mg, Intravenous, Once, 5 of 5 cycles Administration: 840 mg (07/14/2019), 420 mg (08/04/2019), 420 mg (10/05/2019), 420 mg (08/25/2019), 420 mg (09/14/2019) trastuzumab-dkst (OGIVRI) 900 mg in sodium chloride 0.9 % 250 mL chemo infusion, 903 mg, Intravenous,  Once, 5 of 5 cycles Administration: 900 mg (07/14/2019), 672 mg (08/04/2019), 672 mg (10/05/2019), 672 mg (08/25/2019), 672 mg (09/14/2019)  for chemotherapy treatment.    07/22/2019 Genetic Testing   Negative genetic testing:  No pathogenic variants detected on the [employer] Common Hereditary Cancers Panel. The report date is 07/22/2019.  The Common Hereditary  Cancers Panel offered by [facility] includes sequencing and/or deletion duplication testing of the following 48 genes: APC, ATM, AXIN2, BARD1, BMPR1A, BRCA1, BRCA2, BRIP1, CDH1, CDK4, CDKN2A (p14ARF), CDKN2A (p16INK4a), CHEK2, CTNNA1, DICER1, EPCAM  (Deletion/duplication testing only), GREM1 (promoter region deletion/duplication testing only), KIT, MEN1, MLH1, MSH2, MSH3, MSH6, MUTYH, NBN, NF1, NHTL1, PALB2, PDGFRA, PMS2, POLD1, POLE, PTEN, RAD50, RAD51C, RAD51D, RNF43, SDHB, SDHC, SDHD, SMAD4, SMARCA4. STK11, TP53, TSC1, TSC2, and VHL.  The following genes were evaluated for sequence changes only: SDHA and HOXB13 c.251G>A variant only.    11/20/2019 -  Chemotherapy   The patient had pertuzumab (PERJETA) 420 mg in sodium chloride 0.9 % 250 mL chemo infusion, 420 mg (50 % of original dose 840 mg), Intravenous, Once, 4 of 6 cycles Dose modification: 420 mg (original dose 840 mg, Cycle 1, Reason: Provider Judgment) Administration: 420 mg (11/20/2019), 420 mg (12/11/2019), 420 mg (01/01/2020), 420 mg (01/22/2020) trastuzumab-dkst (OGIVRI) 630 mg in sodium chloride 0.9 % 250 mL chemo infusion, 6 mg/kg = 630 mg (75 % of original dose 8 mg/kg), Intravenous,  Once, 4 of 6 cycles Dose modification: 6 mg/kg (original dose 8 mg/kg, Cycle 1, Reason: Provider Judgment) Administration: 630 mg (11/20/2019), 630 mg (12/11/2019), 630 mg (01/01/2020), 630 mg (01/22/2020)  for chemotherapy treatment.    12/13/2019 Surgery   Right lumpectomy [name]): IDC, 5 right axillary lymph nodes negative for carcinoma.    01/23/2020 -  Radiation Therapy   Adjuvant radiation     CHIEF COMPLIANT: Herceptin and Perjeta maintenance  INTERVAL HISTORY: Heidi Costa is a 39 y.o. with above-mentioned history of HER-2 positive breast cancer whocompletedneoadjuvant chemotherapy, underwent a right lumpectomy,and is currently on radiation and Herceptin Perjeta maintenance.She presents to the clinic todayfor treatment.   She developed abdominal cramps and pain that lasted for 24 hours last week.  This is not repeated again.  She had mild diarrhea for which she took Imodium.  ALLERGIES:  is allergic to aspirin.  MEDICATIONS:  Current Outpatient Medications  Medication Sig  Dispense Refill  . acetaminophen (TYLENOL) 500 MG tablet Take 500-1,000 mg by mouth daily as needed for pain.     Marland Kitchen albuterol (VENTOLIN HFA) 108 (90 Base) MCG/ACT inhaler Inhale 2 puffs into the lungs every 6 (six) hours as needed for wheezing or shortness of breath. 18 g 2  . amLODipine (NORVASC) 5 MG tablet Take 1 tablet (5 mg total) by mouth daily. 90 tablet 1  . buPROPion (WELLBUTRIN XL) 300 MG 24 hr tablet Take 1 tablet (300 mg total) by mouth daily. 1 tab qam x 7 days then increase to 2 pills 90 tablet 1  . fluticasone (FLOVENT HFA) 44 MCG/ACT inhaler Inhale 1 puff into the lungs 2 (two) times daily. 1 Inhaler 5  . gabapentin (NEURONTIN) 100 MG capsule Take 1 capsule (100 mg total) by mouth 2 (two) times daily.    Marland Kitchen lidocaine-prilocaine (EMLA) cream SMARTSIG:1 Topical Every Night     No current facility-administered medications for this visit.    PHYSICAL EXAMINATION: ECOG PERFORMANCE STATUS: 1 - Symptomatic but completely ambulatory  Vitals:   02/12/20 1307  BP: (!) 148/90  Pulse: (!) 56  Resp: 20  Temp: (!) 97.4 F (36.3 C)  SpO2: 99%   Filed Weights   02/12/20 1307  Weight: 225 lb 14.4 oz (102.5 kg)    LABORATORY DATA:  I have reviewed the data as listed CMP Latest Ref Rng & Units 01/01/2020 12/11/2019 11/20/2019  Glucose 70 - 99 mg/dL 99 125(H)  101(H)  BUN 6 - 20 mg/dL _0 Creatinine 0.44 - 1.00 mg/dL 0.83 0.84 0.88  Sodium 135 - 145 mmol/L 142 141 141  Potassium 3.5 - 5.1 mmol/L 3.8 3.7 3.8  Chloride 98 - 111 mmol/L 107 108 108  CO2 22 - 32 mmol/L _1 Calcium 8.9 - 10.3 mg/dL 9.5 9.2 9.5  Total Protein 6.5 - 8.1 g/dL 6.8 7.0 6.6  Total Bilirubin 0.3 - 1.2 mg/dL 0.4 0.3 0.4  Alkaline Phos 38 - 126 U/L 64 64 59  AST 15 - 41 U/L 13(L) 16 18  ALT 0 - 44 U/L _2 Lab Results  Component Value Date   WBC 4.0 02/12/2020   HGB 11.4 (L) 02/12/2020   HCT 33.8 (L) 02/12/2020   MCV 97.4 02/12/2020   PLT 279 02/12/2020   NEUTROABS 2.4 02/12/2020     ASSESSMENT & PLAN:  Malignant neoplasm of upper-outer quadrant of right breast in female, estrogen receptor positive ([place]) 06/21/2019:Patient palpated a painful right breast lump x3 weeks. Mammogram showed a 2.0cm right breast mass at the 10 o'clock position, a 3.6cm enlarged right axillary lymph node, and a 0.5cm left breast mass. Biopsy showed, in the right breast and axilla(lymph node), IDC, grade 3, HER-2 equivocal by IHC, + by FISH, ER+ 40%, PR - 0%, Ki67 80%,  left breast, fibrocystic changes, no malignancy.Can be followed.  Treatment plan: 1. Neoadjuvant chemotherapy with [place] Perjeta 5 cycles completed 10/05/2019 followed by HerceptinPerjeta versus Kadcylamaintenance for 1 yearstarted 07/12/2019 2. breast conserving surgery if possible with sentinel lymph node study: 12/13/2019: Pathologic complete response, 0/5 lymph nodes negative 3. Followed by adjuvant radiation therapy  4.  Followed by antiestrogen therapy   Genetic counseling Breast MRI on 07/07/19 showed the 2.9cm biopsy-proven malignancy in the right breast and axilla, highly suspicious non-mass enhancement in the lateral right breast spanning 14.5cm, and no left breast malignancy ----------------------------------------------------------------------------------------------------------------------------------------------------------- Current treatment: Herceptin Perjeta maintenance Adjuvant radiation started 01/23/2020  Herceptin Perjeta toxicities: Diarrhea and cramps, I sent a prescription for Bentyl for the cramps and probiotics as well. Hot flashes: Gabapentin is helping. I ordered an echocardiogram to be done in the next 1 to 2 weeks.  Once radiation is complete we will start her on antiestrogen therapy. Return to clinic every 3 weeks for Herceptin Perjeta every 6 weeks for follow-up with me.    No orders of the defined types were placed in this encounter.  The patient has a good understanding of the overall  plan. she agrees with it. she will call with any problems that may develop before the next visit here.  Total time spent: 30 mins including face to face time and time spent for planning, charting and coordination of care  Heidi Lose, MD 02/12/2020  I, Cloyde Reams Dorshimer, am acting as scribe for Dr. Nicholas Costa.  I have reviewed the above documentation for accuracy and completeness, and I agree with the above.

## 2020-02-12 ENCOUNTER — Ambulatory Visit: Payer: BC Managed Care – PPO

## 2020-02-12 ENCOUNTER — Inpatient Hospital Stay: Payer: BC Managed Care – PPO | Attending: Hematology and Oncology

## 2020-02-12 ENCOUNTER — Inpatient Hospital Stay: Payer: BC Managed Care – PPO

## 2020-02-12 ENCOUNTER — Other Ambulatory Visit: Payer: Self-pay | Admitting: *Deleted

## 2020-02-12 ENCOUNTER — Encounter: Payer: Self-pay | Admitting: Registered Nurse

## 2020-02-12 ENCOUNTER — Inpatient Hospital Stay (HOSPITAL_BASED_OUTPATIENT_CLINIC_OR_DEPARTMENT_OTHER): Payer: BC Managed Care – PPO | Admitting: Hematology and Oncology

## 2020-02-12 ENCOUNTER — Other Ambulatory Visit: Payer: BC Managed Care – PPO

## 2020-02-12 ENCOUNTER — Ambulatory Visit
Admission: RE | Admit: 2020-02-12 | Discharge: 2020-02-12 | Disposition: A | Discharge status: Home / Self Care | Payer: BC Managed Care – PPO | Source: Ambulatory Visit | Attending: Radiation Oncology | Admitting: Radiation Oncology

## 2020-02-12 ENCOUNTER — Other Ambulatory Visit: Payer: Self-pay

## 2020-02-12 ENCOUNTER — Ambulatory Visit: Payer: BC Managed Care – PPO | Admitting: Hematology and Oncology

## 2020-02-12 ENCOUNTER — Ambulatory Visit (INDEPENDENT_AMBULATORY_CARE_PROVIDER_SITE_OTHER): Payer: BC Managed Care – PPO | Admitting: Registered Nurse

## 2020-02-12 VITALS — BP 144/92 | HR 94 | Temp 98.0°F | Ht 65.0 in | Wt 226.2 lb

## 2020-02-12 DIAGNOSIS — E785 Hyperlipidemia, unspecified: Secondary | ICD-10-CM | POA: Diagnosis not present

## 2020-02-12 DIAGNOSIS — R197 Diarrhea, unspecified: Secondary | ICD-10-CM | POA: Diagnosis not present

## 2020-02-12 DIAGNOSIS — Z803 Family history of malignant neoplasm of breast: Secondary | ICD-10-CM | POA: Diagnosis not present

## 2020-02-12 DIAGNOSIS — R232 Flushing: Secondary | ICD-10-CM | POA: Insufficient documentation

## 2020-02-12 DIAGNOSIS — Z87891 Personal history of nicotine dependence: Secondary | ICD-10-CM | POA: Insufficient documentation

## 2020-02-12 DIAGNOSIS — F418 Other specified anxiety disorders: Secondary | ICD-10-CM | POA: Insufficient documentation

## 2020-02-12 DIAGNOSIS — Z923 Personal history of irradiation: Secondary | ICD-10-CM | POA: Diagnosis not present

## 2020-02-12 DIAGNOSIS — Z95828 Presence of other vascular implants and grafts: Secondary | ICD-10-CM

## 2020-02-12 DIAGNOSIS — C50411 Malignant neoplasm of upper-outer quadrant of right female breast: Secondary | ICD-10-CM | POA: Diagnosis not present

## 2020-02-12 DIAGNOSIS — J45909 Unspecified asthma, uncomplicated: Secondary | ICD-10-CM | POA: Diagnosis not present

## 2020-02-12 DIAGNOSIS — K219 Gastro-esophageal reflux disease without esophagitis: Secondary | ICD-10-CM | POA: Diagnosis not present

## 2020-02-12 DIAGNOSIS — R011 Cardiac murmur, unspecified: Secondary | ICD-10-CM | POA: Diagnosis not present

## 2020-02-12 DIAGNOSIS — Z170 Estrogen receptor positive status [ER+]: Secondary | ICD-10-CM

## 2020-02-12 DIAGNOSIS — M129 Arthropathy, unspecified: Secondary | ICD-10-CM | POA: Diagnosis not present

## 2020-02-12 DIAGNOSIS — Z5112 Encounter for antineoplastic immunotherapy: Secondary | ICD-10-CM | POA: Diagnosis not present

## 2020-02-12 DIAGNOSIS — R109 Unspecified abdominal pain: Secondary | ICD-10-CM | POA: Diagnosis not present

## 2020-02-12 DIAGNOSIS — I10 Essential (primary) hypertension: Secondary | ICD-10-CM | POA: Diagnosis not present

## 2020-02-12 DIAGNOSIS — R1084 Generalized abdominal pain: Secondary | ICD-10-CM | POA: Diagnosis not present

## 2020-02-12 DIAGNOSIS — E669 Obesity, unspecified: Secondary | ICD-10-CM | POA: Diagnosis not present

## 2020-02-12 LAB — CBC WITH DIFFERENTIAL (CANCER CENTER ONLY)
Abs Immature Granulocytes: 0.01 10*3/uL (ref 0.00–0.07)
Basophils Absolute: 0 10*3/uL (ref 0.0–0.1)
Basophils Relative: 1 %
Eosinophils Absolute: 0.1 10*3/uL (ref 0.0–0.5)
Eosinophils Relative: 4 %
HCT: 33.8 % — ABNORMAL LOW (ref 36.0–46.0)
Hemoglobin: 11.4 g/dL — ABNORMAL LOW (ref 12.0–15.0)
Immature Granulocytes: 0 %
Lymphocytes Relative: 29 %
Lymphs Abs: 1.2 10*3/uL (ref 0.7–4.0)
MCH: 32.9 pg (ref 26.0–34.0)
MCHC: 33.7 g/dL (ref 30.0–36.0)
MCV: 97.4 fL (ref 80.0–100.0)
Monocytes Absolute: 0.3 10*3/uL (ref 0.1–1.0)
Monocytes Relative: 8 %
Neutro Abs: 2.4 10*3/uL (ref 1.7–7.7)
Neutrophils Relative %: 58 %
Platelet Count: 279 10*3/uL (ref 150–400)
RBC: 3.47 MIL/uL — ABNORMAL LOW (ref 3.87–5.11)
RDW: 13.7 % (ref 11.5–15.5)
WBC Count: 4 10*3/uL (ref 4.0–10.5)
nRBC: 0 % (ref 0.0–0.2)

## 2020-02-12 LAB — CMP (CANCER CENTER ONLY)
ALT: 7 U/L (ref 0–44)
AST: 12 U/L — ABNORMAL LOW (ref 15–41)
Albumin: 3.6 g/dL (ref 3.5–5.0)
Alkaline Phosphatase: 69 U/L (ref 38–126)
Anion gap: 7 (ref 5–15)
BUN: 11 mg/dL (ref 6–20)
CO2: 25 mmol/L (ref 22–32)
Calcium: 9.1 mg/dL (ref 8.9–10.3)
Chloride: 109 mmol/L (ref 98–111)
Creatinine: 0.93 mg/dL (ref 0.44–1.00)
GFR, Estimated: >60 mL/min (ref >60)
Glucose, Bld: 92 mg/dL (ref 70–99)
Potassium: 3.7 mmol/L (ref 3.5–5.1)
Sodium: 141 mmol/L (ref 135–145)
Total Bilirubin: 0.4 mg/dL (ref 0.3–1.2)
Total Protein: 6.8 g/dL (ref 6.5–8.1)

## 2020-02-12 LAB — PREGNANCY, URINE: Preg Test, Ur: NEGATIVE

## 2020-02-12 MED ORDER — SACCHAROMYCES BOULARDII 250 MG PO CAPS: 250.0000 mg | ORAL_CAPSULE | Freq: Two times a day (BID) | ORAL | 3 refills | Status: DC

## 2020-02-12 MED ORDER — SODIUM CHLORIDE 0.9 % IV SOLN
Freq: Once | INTRAVENOUS | Status: AC
  Filled 2020-02-12: qty 250

## 2020-02-12 MED ORDER — DICYCLOMINE HCL 10 MG PO CAPS: 10.0000 mg | ORAL_CAPSULE | Freq: Three times a day (TID) | ORAL | 1 refills | Status: DC | PRN

## 2020-02-12 MED ORDER — ACETAMINOPHEN 325 MG PO TABS
650.0000 mg | ORAL_TABLET | Freq: Once | ORAL | Status: AC
  Administered 2020-02-12: 650 mg via ORAL

## 2020-02-12 MED ORDER — DIPHENHYDRAMINE HCL 25 MG PO CAPS
50.0000 mg | ORAL_CAPSULE | Freq: Once | ORAL | Status: AC
  Administered 2020-02-12: 50 mg via ORAL

## 2020-02-12 MED ORDER — HEPARIN SOD (PORK) LOCK FLUSH 100 UNIT/ML IV SOLN
500.0000 [IU] | Freq: Once | INTRAVENOUS | Status: DC | PRN
  Filled 2020-02-12: qty 5

## 2020-02-12 MED ORDER — SODIUM CHLORIDE 0.9 % IV SOLN
420.0000 mg | Freq: Once | INTRAVENOUS | Status: AC
  Administered 2020-02-12: 420 mg via INTRAVENOUS
  Filled 2020-02-12: qty 14

## 2020-02-12 MED ORDER — SODIUM CHLORIDE 0.9% FLUSH
10.0000 mL | INTRAVENOUS | Status: DC | PRN
  Filled 2020-02-12: qty 10

## 2020-02-12 MED ORDER — TRASTUZUMAB-DKST CHEMO 150 MG IV SOLR
6.0000 mg/kg | Freq: Once | INTRAVENOUS | Status: AC
  Administered 2020-02-12: 630 mg via INTRAVENOUS
  Filled 2020-02-12: qty 30

## 2020-02-12 MED ORDER — SODIUM CHLORIDE 0.9% FLUSH
10.0000 mL | Freq: Once | INTRAVENOUS | Status: AC
  Administered 2020-02-12: 10 mL
  Filled 2020-02-12: qty 10

## 2020-02-12 NOTE — Assessment & Plan Note (Signed)
06/21/2019:Patient palpated a painful right breast lump x3 weeks. Mammogram showed a 2.0cm right breast mass at the 10 o'clock position, a 3.6cm enlarged right axillary lymph node, and a 0.5cm left breast mass. Biopsy showed, in the right breast and axilla(lymph node), IDC, grade 3, HER-2 equivocal by IHC, + by FISH, ER+ 40%, PR - 0%, Ki67 80%,  left breast, fibrocystic changes, no malignancy.Can be followed.  Treatment plan: 1. Neoadjuvant chemotherapy with [place] Perjeta 5 cycles completed 10/05/2019 followed by HerceptinPerjeta versus Kadcylamaintenance for 1 yearstarted 07/12/2019 2. breast conserving surgery if possible with sentinel lymph node study: 12/13/2019: Pathologic complete response, 0/5 lymph nodes negative 3. Followed by adjuvant radiation therapy  4.  Followed by antiestrogen therapy   Genetic counseling Breast MRI on 07/07/19 showed the 2.9cm biopsy-proven malignancy in the right breast and axilla, highly suspicious non-mass enhancement in the lateral right breast spanning 14.5cm, and no left breast malignancy ----------------------------------------------------------------------------------------------------------------------------------------------------------- Current treatment: Herceptin Perjeta maintenance Adjuvant radiation started 01/23/2020  Herceptin Perjeta toxicities: None Hot flashes: Gabapentin is helping.  Once radiation is complete we will start her on antiestrogen therapy. Return to clinic every 3 weeks for Herceptin Perjeta every 6 weeks for follow-up with me.

## 2020-02-12 NOTE — Progress Notes (Signed)
Patient refused to wait 30 minute post observation. Stated she has had treatment several times. VSS

## 2020-02-12 NOTE — Patient Instructions (Signed)
° ° ° °  If you have lab work done today you will be contacted with your lab results within the next 2 weeks.  If you have not heard from us then please contact us. The fastest way to get your results is to register for My Chart. ° ° °IF you received an x-ray today, you will receive an invoice from [facility]. Please contact [facility] at [phone] with questions or concerns regarding your invoice.  ° °IF you received labwork today, you will receive an invoice from [facility]. Please contact [facility] at [phone] with questions or concerns regarding your invoice.  ° °Our billing staff will not be able to assist you with questions regarding bills from these companies. ° °You will be contacted with the lab results as soon as they are available. The fastest way to get your results is to activate your My Chart account. Instructions are located on the last page of this paperwork. If you have not heard from us regarding the results in 2 weeks, please contact this office. °  ° ° ° °

## 2020-02-13 ENCOUNTER — Ambulatory Visit
Admission: RE | Admit: 2020-02-13 | Discharge: 2020-02-13 | Disposition: A | Discharge status: Home / Self Care | Payer: BC Managed Care – PPO | Source: Ambulatory Visit | Attending: Radiation Oncology | Admitting: Radiation Oncology

## 2020-02-13 DIAGNOSIS — C50411 Malignant neoplasm of upper-outer quadrant of right female breast: Secondary | ICD-10-CM | POA: Diagnosis not present

## 2020-02-14 ENCOUNTER — Ambulatory Visit
Admission: RE | Admit: 2020-02-14 | Discharge: 2020-02-14 | Disposition: A | Discharge status: Home / Self Care | Payer: BC Managed Care – PPO | Source: Ambulatory Visit | Attending: Radiation Oncology | Admitting: Radiation Oncology

## 2020-02-14 ENCOUNTER — Telehealth: Payer: Self-pay | Admitting: Hematology and Oncology

## 2020-02-14 DIAGNOSIS — C50411 Malignant neoplasm of upper-outer quadrant of right female breast: Secondary | ICD-10-CM | POA: Diagnosis not present

## 2020-02-14 NOTE — Telephone Encounter (Signed)
Scheduled per 11/15 los. Pt will receive an updated appt calendar per next visit appt notes

## 2020-02-15 ENCOUNTER — Ambulatory Visit
Admission: RE | Admit: 2020-02-15 | Discharge: 2020-02-15 | Disposition: A | Discharge status: Home / Self Care | Payer: BC Managed Care – PPO | Source: Ambulatory Visit | Attending: Radiation Oncology | Admitting: Radiation Oncology

## 2020-02-15 DIAGNOSIS — C50411 Malignant neoplasm of upper-outer quadrant of right female breast: Secondary | ICD-10-CM | POA: Diagnosis not present

## 2020-02-16 ENCOUNTER — Other Ambulatory Visit: Payer: Self-pay

## 2020-02-16 ENCOUNTER — Ambulatory Visit
Admission: RE | Admit: 2020-02-16 | Discharge: 2020-02-16 | Disposition: A | Discharge status: Home / Self Care | Payer: BC Managed Care – PPO | Source: Ambulatory Visit | Attending: Radiation Oncology | Admitting: Radiation Oncology

## 2020-02-16 DIAGNOSIS — C50411 Malignant neoplasm of upper-outer quadrant of right female breast: Secondary | ICD-10-CM | POA: Diagnosis not present

## 2020-02-18 ENCOUNTER — Ambulatory Visit
Admission: RE | Admit: 2020-02-18 | Discharge: 2020-02-18 | Disposition: A | Discharge status: Home / Self Care | Payer: BC Managed Care – PPO | Source: Ambulatory Visit | Attending: Radiation Oncology | Admitting: Radiation Oncology

## 2020-02-18 ENCOUNTER — Other Ambulatory Visit: Payer: Self-pay

## 2020-02-18 DIAGNOSIS — C50411 Malignant neoplasm of upper-outer quadrant of right female breast: Secondary | ICD-10-CM | POA: Diagnosis not present

## 2020-02-19 ENCOUNTER — Other Ambulatory Visit: Payer: Self-pay | Admitting: *Deleted

## 2020-02-19 ENCOUNTER — Telehealth: Payer: Self-pay | Admitting: *Deleted

## 2020-02-19 ENCOUNTER — Inpatient Hospital Stay: Payer: BC Managed Care – PPO

## 2020-02-19 ENCOUNTER — Ambulatory Visit
Admission: RE | Admit: 2020-02-19 | Discharge: 2020-02-19 | Disposition: A | Discharge status: Home / Self Care | Payer: BC Managed Care – PPO | Source: Ambulatory Visit | Attending: Radiation Oncology | Admitting: Radiation Oncology

## 2020-02-19 DIAGNOSIS — Z170 Estrogen receptor positive status [ER+]: Secondary | ICD-10-CM

## 2020-02-19 DIAGNOSIS — C50411 Malignant neoplasm of upper-outer quadrant of right female breast: Secondary | ICD-10-CM

## 2020-02-19 LAB — CBC WITH DIFFERENTIAL (CANCER CENTER ONLY)
Abs Immature Granulocytes: 0.01 10*3/uL (ref 0.00–0.07)
Basophils Absolute: 0 10*3/uL (ref 0.0–0.1)
Basophils Relative: 1 %
Eosinophils Absolute: 0.2 10*3/uL (ref 0.0–0.5)
Eosinophils Relative: 4 %
HCT: 36.3 % (ref 36.0–46.0)
Hemoglobin: 12.1 g/dL (ref 12.0–15.0)
Immature Granulocytes: 0 %
Lymphocytes Relative: 30 %
Lymphs Abs: 1.3 10*3/uL (ref 0.7–4.0)
MCH: 32.8 pg (ref 26.0–34.0)
MCHC: 33.3 g/dL (ref 30.0–36.0)
MCV: 98.4 fL (ref 80.0–100.0)
Monocytes Absolute: 0.4 10*3/uL (ref 0.1–1.0)
Monocytes Relative: 8 %
Neutro Abs: 2.4 10*3/uL (ref 1.7–7.7)
Neutrophils Relative %: 57 %
Platelet Count: 305 10*3/uL (ref 150–400)
RBC: 3.69 MIL/uL — ABNORMAL LOW (ref 3.87–5.11)
RDW: 13.7 % (ref 11.5–15.5)
WBC Count: 4.2 10*3/uL (ref 4.0–10.5)
nRBC: 0 % (ref 0.0–0.2)

## 2020-02-19 LAB — CMP (CANCER CENTER ONLY)
ALT: 8 U/L (ref 0–44)
AST: 12 U/L — ABNORMAL LOW (ref 15–41)
Albumin: 3.7 g/dL (ref 3.5–5.0)
Alkaline Phosphatase: 73 U/L (ref 38–126)
Anion gap: 11 (ref 5–15)
BUN: 9 mg/dL (ref 6–20)
CO2: 25 mmol/L (ref 22–32)
Calcium: 9.3 mg/dL (ref 8.9–10.3)
Chloride: 104 mmol/L (ref 98–111)
Creatinine: 1.01 mg/dL — ABNORMAL HIGH (ref 0.44–1.00)
GFR, Estimated: >60 mL/min (ref >60)
Glucose, Bld: 89 mg/dL (ref 70–99)
Potassium: 4 mmol/L (ref 3.5–5.1)
Sodium: 140 mmol/L (ref 135–145)
Total Bilirubin: 0.3 mg/dL (ref 0.3–1.2)
Total Protein: 7.2 g/dL (ref 6.5–8.1)

## 2020-02-19 NOTE — Telephone Encounter (Signed)
Received call from pt with complaint of severe fatigue with radiation therapy.  Per MD pt to be seen in [facility] for [facility] evaluation.  Apt scheduled and pt verbalized understanding of apt date and time.

## 2020-02-20 ENCOUNTER — Other Ambulatory Visit: Payer: Self-pay

## 2020-02-20 ENCOUNTER — Inpatient Hospital Stay (HOSPITAL_BASED_OUTPATIENT_CLINIC_OR_DEPARTMENT_OTHER): Payer: BC Managed Care – PPO | Admitting: Medical

## 2020-02-20 ENCOUNTER — Ambulatory Visit
Admission: RE | Admit: 2020-02-20 | Discharge: 2020-02-20 | Disposition: A | Discharge status: Home / Self Care | Payer: BC Managed Care – PPO | Source: Ambulatory Visit | Attending: Radiation Oncology | Admitting: Radiation Oncology

## 2020-02-20 VITALS — BP 133/90 | HR 57 | Temp 97.7°F | Resp 17 | Ht 65.0 in | Wt 222.7 lb

## 2020-02-20 DIAGNOSIS — C50411 Malignant neoplasm of upper-outer quadrant of right female breast: Secondary | ICD-10-CM | POA: Diagnosis not present

## 2020-02-20 DIAGNOSIS — Z170 Estrogen receptor positive status [ER+]: Secondary | ICD-10-CM | POA: Diagnosis not present

## 2020-02-21 ENCOUNTER — Ambulatory Visit: Payer: BC Managed Care – PPO

## 2020-02-23 NOTE — Progress Notes (Signed)
[facility]pt[facility] Progress Note   Heidi Costa [id number] October 19, 1980 39 y.o.  Heidi Costa is managed by Dr. Nicholas Lose  Actively treated with chemotherapy/immunotherapy/hormonal therapy: yes  Current therapy: Perjeta and Ogivri  Last treated: 02/12/2020 (cycle 5, day 1)  Next scheduled appointment with provider: 05/06/2020  Assessment: Plan:    Malignant neoplasm of upper-outer quadrant of right breast in female, estrogen receptor positive ([place])   ER positive malignant neoplasm of the right breast: Ms. Crute presents to the clinic today status post cycle 5, day 1 of Perjeta and Ogivri which was dosed on 02/12/2020.  She continues with concurrent radiation therapy and will return for treatment only and then will be seen in follow-up by Dr. Nicholas Lose on 05/06/2020.  Please see After Visit Summary for patient specific instructions.  Future Appointments  Date Time Provider [place]  02/26/2020  4:00 PM [facility] LINAC 4 [facility] None  02/27/2020  4:00 PM [facility] LINAC 4 [facility] None  02/28/2020  4:00 PM [facility] LINAC 4 [facility] None  02/29/2020  4:00 PM [facility] LINAC 4 [facility] None  03/01/2020  4:00 PM [facility] LINAC 3 [facility] None  03/04/2020  2:00 PM [facility] LINAC 4 [facility] None  03/04/2020  2:30 PM [facility] INFUSION [facility] None  03/05/2020  4:00 PM [facility] LINAC 4 [facility] None  03/06/2020  4:00 PM [facility] LINAC 3 [facility] None  03/07/2020  4:00 PM [facility] LINAC 3 [facility] None  03/08/2020  4:00 PM [facility] LINAC 4 [facility] None  03/11/2020  4:00 PM [facility] LINAC 4 [facility] None  03/25/2020  3:30 PM Rosenberger, Valerie A, PTA [facility] None  03/26/2020 12:15 PM [facility] LAB [facility] None  03/26/2020 12:30 PM [facility] [place] FLUSH [facility] None  03/26/2020  1:30 PM [facility] INFUSION [facility] None  04/15/2020  2:30 PM [facility] INFUSION [facility] None   05/02/2020  4:00 PM Just, Laurita Quint, FNP PCP-PCP [facility]  05/06/2020  1:00 PM [facility] LAB [facility] None  05/06/2020  1:15 PM [facility] [place] FLUSH [facility] None  05/06/2020  1:45 PM Nicholas Lose, MD [facility] None  05/06/2020  2:30 PM [facility] INFUSION [facility] None    No orders of the defined types were placed in this encounter.      Subjective:   Patient ID:  Heidi Costa is a 39 y.o. (DOB 10/23/1980) female.  Chief Complaint: No chief complaint on file.   HPI Heidi Costa  is a 39 y.o. female with a diagnosis of an ER positive malignant neoplasm of the right breast.  She is followed by Dr. Nicholas Lose and is status post cycle 5, day 1 of Perjeta and Ogivri which was dosed on 02/12/2020.  She continues with concurrent radiation therapy and has completed 20/30 fractions of radiation thus far.  She reports that her fluid intake has been slightly lower but that she is eating well.  She had reported having some shortness of breath which has resolved.  She denies orthostatic hypotension, dizziness, fevers, chills, sweats, nausea, or vomiting.  She reports that she feels as if she is doing well today.   Medications: I have reviewed the patient's current medications.  Allergies:  Allergies  Allergen Reactions  . Aspirin Rash    Past Medical History:  Diagnosis Date  . Abnormality of gait 08/02/2012   resolved per patient on 07/12/19  . Anemia    iron taking in the past, not currently  . Anxiety   . Arthritis    spine- cerv.  & arm   .  Asthma   . Bulging lumbar disc   . Cancer [facility])    Right Breast-starts chemo on 07/14/19  . Chronic arthralgias of knees and hips 08/02/2012  . Depression    Pt. took self off Celexa one month ago, states she did n't like how it made her feel .  Marland Kitchen Family history of cancer   . GERD (gastroesophageal reflux disease)    diet controlled  . Heart murmur    pt. been told that this is a fact, no echo or cardiac surveillance in the past    . HLD (hyperlipidemia)    no meds, diet controlled  . Hypertension   . Obesity   . Spinal stenosis     Past Surgical History:  Procedure Laterality Date  . ANTERIOR CERVICAL DECOMP/DISCECTOMY FUSION N/A 10/27/2012   Procedure: ANTERIOR CERVICAL DECOMPRESSION/DISCECTOMY FUSION 1 LEVEL;  Surgeon: Faythe Ghee, MD;  Location: [place] NEURO ORS;  Service: Neurosurgery;  Laterality: N/A;  ANTERIOR CERVICAL DECOMPRESSION/DISCECTOMY FUSION 1 LEVEL  . BREAST LUMPECTOMY WITH RADIOACTIVE SEED AND SENTINEL LYMPH NODE BIOPSY Right 12/13/2019   Procedure: RIGHT BREAST LUMPECTOMY WITH RADIOACTIVE SEED AND RIGHT AXILLARY  SENTINEL LYMPH NODE BIOPSY, RIGHT AXILLARY NODE SEED GUIDED EXCISION, BLUE DYE INJECTION;  Surgeon: Rolm Bookbinder, MD;  Location: [place];  Service: General;  Laterality: Right;  PEC BLOCK  . CHALAZION EXCISION     L eye   . PORTACATH PLACEMENT Left 07/13/2019   Procedure: INSERTION PORT-A-CATH WITH ULTRASOUND;  Surgeon: Rolm Bookbinder, MD;  Location: [place];  Service: General;  Laterality: Left;  Marland Kitchen VAGINAL DELIVERY  2002  . WISDOM TOOTH EXTRACTION      Family History  Problem Relation Age of Onset  . Osteoarthritis Mother   . Hypertension Mother   . Diabetes Mother   . Hypertension Father   . Gout Father   . Stroke Father 17       embolic  . Diabetes Half-Brother   . Hypertension Half-Brother   . Heart failure Half-Brother 45  . Kidney disease Half-Brother   . Diabetes Paternal Grandfather   . Cancer Paternal Grandfather 81       breast cancer    Social History   Socioeconomic History  . Marital status: Married    Spouse name: Not on file  . Number of children: 1  . Years of education: associates  . Highest education level: Not on file  Occupational History    Employer: OTHER  Tobacco Use  . Smoking status: Former Smoker    Packs/day: 1.00    Years: 10.00    Pack years: 10.00    Types: Cigarettes    Quit date: 06/25/2019    Years since  quitting: 0.6  . Smokeless tobacco: Never Used  Vaping Use  . Vaping Use: Never used  Substance and Sexual Activity  . Alcohol use: Yes    Comment: occas  . Drug use: Yes    Types: Marijuana    Comment: uses daily - last use 07/12/19  . Sexual activity: Yes    Birth control/protection: None  Other Topics Concern  . Not on file  Social History Narrative   Patient is married with one child.   Patient is right handed.   Patient has a Associate's degree.   Patient drinks 2-3 cups daily.   Social Determinants of Health   Financial Resource Strain:   . Difficulty of Paying Living Expenses: Not on file  Food Insecurity:   . Worried About  Running Out of Food in the Last Year: Not on file  . Ran Out of Food in the Last Year: Not on file  Transportation Needs:   . Lack of Transportation (Medical): Not on file  . Lack of Transportation (Non-Medical): Not on file  Physical Activity:   . Days of Exercise per Week: Not on file  . Minutes of Exercise per Session: Not on file  Stress:   . Feeling of Stress : Not on file  Social Connections:   . Frequency of Communication with Friends and Family: Not on file  . Frequency of Social Gatherings with Friends and Family: Not on file  . Attends Religious Services: Not on file  . Active Member of Clubs or Organizations: Not on file  . Attends Archivist Meetings: Not on file  . Marital Status: Not on file  Intimate Partner Violence:   . Fear of Current or Ex-Partner: Not on file  . Emotionally Abused: Not on file  . Physically Abused: Not on file  . Sexually Abused: Not on file    Past Medical History, Surgical history, Social history, and Family history were reviewed and updated as appropriate.   Please see review of systems for further details on the patient's review from today.   Review of Systems:  Review of Systems  Constitutional: Negative for chills, diaphoresis and fever.  HENT: Negative for trouble swallowing and  voice change.   Respiratory: Negative for cough, chest tightness, shortness of breath and wheezing.   Cardiovascular: Negative for chest pain and palpitations.  Gastrointestinal: Negative for abdominal pain, constipation, diarrhea, nausea and vomiting.  Musculoskeletal: Negative for back pain and myalgias.  Neurological: Negative for dizziness, light-headedness and headaches.    Objective:   Physical Exam:  BP 133/90 (BP Location: Left Arm, Patient Position: Sitting)   Pulse (!) 57   Temp 97.7 F (36.5 C) (Tympanic)   Resp 17   Ht 5\' 5"  (1.651 m)   Wt 222 lb 11.2 oz (101 kg)   SpO2 100%   BMI 37.06 kg/m  ECOG: 0  Orthostatic Blood Pressure: Blood pressure:   sitting 135/85, standing 133/70 Pulse:   sitting 55, standing 59    Physical Exam Constitutional:      General: She is not in acute distress.    Appearance: She is not diaphoretic.  HENT:     Head: Normocephalic and atraumatic.  Eyes:     General: No scleral icterus.       Right eye: No discharge.        Left eye: No discharge.     Conjunctiva/sclera: Conjunctivae normal.  Cardiovascular:     Rate and Rhythm: Normal rate and regular rhythm.     Heart sounds: Normal heart sounds. No murmur heard.  No friction rub. No gallop.   Pulmonary:     Effort: Pulmonary effort is normal. No respiratory distress.     Breath sounds: Normal breath sounds. No wheezing or rales.  Skin:    General: Skin is warm and dry.     Findings: No erythema or rash.  Neurological:     Mental Status: She is alert.     Coordination: Coordination normal.     Gait: Gait normal.  Psychiatric:        Mood and Affect: Mood normal.        Behavior: Behavior normal.        Thought Content: Thought content normal.        Judgment: Judgment  normal.     Lab Review:     Component Value Date/Time   NA 140 02/19/2020 1558   NA 141 06/06/2019 1545   K 4.0 02/19/2020 1558   CL 104 02/19/2020 1558   CO2 25 02/19/2020 1558   GLUCOSE 89  02/19/2020 1558   BUN 9 02/19/2020 1558   BUN 7 06/06/2019 1545   CREATININE 1.01 (H) 02/19/2020 1558   CREATININE 0.79 07/11/2015 1905   CALCIUM 9.3 02/19/2020 1558   PROT 7.2 02/19/2020 1558   PROT 6.5 06/06/2019 1545   ALBUMIN 3.7 02/19/2020 1558   ALBUMIN 4.4 06/06/2019 1545   AST 12 (L) 02/19/2020 1558   ALT 8 02/19/2020 1558   ALKPHOS 73 02/19/2020 1558   BILITOT 0.3 02/19/2020 1558   GFRNONAA >60 02/19/2020 1558   GFRNONAA >89 07/11/2015 1905   GFRAA >60 01/01/2020 0914   GFRAA >89 07/11/2015 1905       Component Value Date/Time   WBC 4.2 02/19/2020 1558   WBC 6.2 10/25/2012 1400   RBC 3.69 (L) 02/19/2020 1558   HGB 12.1 02/19/2020 1558   HCT 36.3 02/19/2020 1558   PLT 305 02/19/2020 1558   MCV 98.4 02/19/2020 1558   MCV 93.1 07/11/2015 1908   MCH 32.8 02/19/2020 1558   MCHC 33.3 02/19/2020 1558   RDW 13.7 02/19/2020 1558   LYMPHSABS 1.3 02/19/2020 1558   MONOABS 0.4 02/19/2020 1558   EOSABS 0.2 02/19/2020 1558   BASOSABS 0.0 02/19/2020 1558   -------------------------------  Imaging from last 24 hours (if applicable):  Radiology interpretation: No results found.

## 2020-02-26 ENCOUNTER — Ambulatory Visit: Payer: BC Managed Care – PPO | Admitting: Radiation Oncology

## 2020-02-26 ENCOUNTER — Ambulatory Visit
Admission: RE | Admit: 2020-02-26 | Discharge: 2020-02-26 | Disposition: A | Discharge status: Home / Self Care | Payer: BC Managed Care – PPO | Source: Ambulatory Visit | Attending: Radiation Oncology | Admitting: Radiation Oncology

## 2020-02-26 ENCOUNTER — Encounter: Payer: Self-pay | Admitting: Radiation Oncology

## 2020-02-26 ENCOUNTER — Other Ambulatory Visit: Payer: Self-pay

## 2020-02-26 DIAGNOSIS — C50411 Malignant neoplasm of upper-outer quadrant of right female breast: Secondary | ICD-10-CM | POA: Diagnosis not present

## 2020-02-26 NOTE — Progress Notes (Signed)
The patient was seen today after her 23/28 fraction to the right breast and regional nodes. She has not started her boost. She was concerned and called this morning about her skin at this time.   On examination she has multiple cell islands and dry desquamation with a small area of moist desquamation along the lateral aspect of her inframammary fold.  There is also anticipated hyperpigmentation of the right breast. Her breasts are large and pendulous, and we will switch to silvadene as she confirmed no sulfa allergy. We applied this for her as well along the dry desquamation at the base of the neck along the supraclavicular treatment site.   We will plan to see her on Wednesday for a skin check, or sooner if needed. She was given silvadene and telfa dressings to try. A letter for work was given so she can work remotely during this time.     Carola Rhine, PAC

## 2020-02-27 ENCOUNTER — Ambulatory Visit
Admission: RE | Admit: 2020-02-27 | Discharge: 2020-02-27 | Disposition: A | Discharge status: Home / Self Care | Payer: BC Managed Care – PPO | Source: Ambulatory Visit | Attending: Radiation Oncology | Admitting: Radiation Oncology

## 2020-02-27 DIAGNOSIS — C50411 Malignant neoplasm of upper-outer quadrant of right female breast: Secondary | ICD-10-CM | POA: Diagnosis not present

## 2020-02-28 ENCOUNTER — Ambulatory Visit
Admission: RE | Admit: 2020-02-28 | Discharge: 2020-02-28 | Disposition: A | Discharge status: Home / Self Care | Payer: BC Managed Care – PPO | Source: Ambulatory Visit | Attending: Radiation Oncology | Admitting: Radiation Oncology

## 2020-02-28 DIAGNOSIS — C50411 Malignant neoplasm of upper-outer quadrant of right female breast: Secondary | ICD-10-CM | POA: Diagnosis not present

## 2020-02-28 DIAGNOSIS — Z170 Estrogen receptor positive status [ER+]: Secondary | ICD-10-CM | POA: Diagnosis not present

## 2020-02-28 DIAGNOSIS — Z510 Encounter for antineoplastic radiation therapy: Secondary | ICD-10-CM | POA: Diagnosis not present

## 2020-02-29 ENCOUNTER — Ambulatory Visit
Admission: RE | Admit: 2020-02-29 | Discharge: 2020-02-29 | Disposition: A | Discharge status: Home / Self Care | Payer: BC Managed Care – PPO | Source: Ambulatory Visit | Attending: Radiation Oncology | Admitting: Radiation Oncology

## 2020-02-29 DIAGNOSIS — C50411 Malignant neoplasm of upper-outer quadrant of right female breast: Secondary | ICD-10-CM | POA: Diagnosis not present

## 2020-03-01 ENCOUNTER — Ambulatory Visit
Admission: RE | Admit: 2020-03-01 | Discharge: 2020-03-01 | Disposition: A | Discharge status: Home / Self Care | Payer: BC Managed Care – PPO | Source: Ambulatory Visit | Attending: Radiation Oncology | Admitting: Radiation Oncology

## 2020-03-01 ENCOUNTER — Ambulatory Visit: Payer: BC Managed Care – PPO | Admitting: Radiation Oncology

## 2020-03-01 ENCOUNTER — Other Ambulatory Visit: Payer: Self-pay

## 2020-03-01 DIAGNOSIS — C50411 Malignant neoplasm of upper-outer quadrant of right female breast: Secondary | ICD-10-CM | POA: Diagnosis not present

## 2020-03-04 ENCOUNTER — Encounter: Payer: Self-pay | Admitting: *Deleted

## 2020-03-04 ENCOUNTER — Other Ambulatory Visit: Payer: Self-pay | Admitting: Radiation Oncology

## 2020-03-04 ENCOUNTER — Encounter: Payer: Self-pay | Admitting: Radiation Oncology

## 2020-03-04 ENCOUNTER — Ambulatory Visit: Payer: BC Managed Care – PPO

## 2020-03-04 ENCOUNTER — Inpatient Hospital Stay: Payer: BC Managed Care – PPO | Attending: Hematology and Oncology

## 2020-03-04 ENCOUNTER — Ambulatory Visit
Admission: RE | Admit: 2020-03-04 | Discharge: 2020-03-04 | Disposition: A | Discharge status: Home / Self Care | Payer: BC Managed Care – PPO | Source: Ambulatory Visit | Attending: Radiation Oncology | Admitting: Radiation Oncology

## 2020-03-04 ENCOUNTER — Other Ambulatory Visit: Payer: Self-pay

## 2020-03-04 VITALS — BP 149/69 | HR 75 | Temp 97.7°F | Resp 16 | Wt 223.5 lb

## 2020-03-04 DIAGNOSIS — C50411 Malignant neoplasm of upper-outer quadrant of right female breast: Secondary | ICD-10-CM | POA: Insufficient documentation

## 2020-03-04 DIAGNOSIS — Z170 Estrogen receptor positive status [ER+]: Secondary | ICD-10-CM | POA: Insufficient documentation

## 2020-03-04 DIAGNOSIS — Z5112 Encounter for antineoplastic immunotherapy: Secondary | ICD-10-CM | POA: Insufficient documentation

## 2020-03-04 LAB — PREGNANCY, URINE: Preg Test, Ur: NEGATIVE

## 2020-03-04 MED ORDER — SODIUM CHLORIDE 0.9% FLUSH
10.0000 mL | INTRAVENOUS | Status: DC | PRN
  Administered 2020-03-04: 10 mL
  Filled 2020-03-04: qty 10

## 2020-03-04 MED ORDER — SODIUM CHLORIDE 0.9 % IV SOLN
420.0000 mg | Freq: Once | INTRAVENOUS | Status: AC
  Administered 2020-03-04: 420 mg via INTRAVENOUS
  Filled 2020-03-04: qty 14

## 2020-03-04 MED ORDER — DIPHENHYDRAMINE HCL 25 MG PO CAPS
ORAL_CAPSULE | ORAL | Status: AC
  Filled 2020-03-04: qty 2

## 2020-03-04 MED ORDER — OXYCODONE HCL 5 MG PO TABS
5.0000 mg | ORAL_TABLET | ORAL | 0 refills | Status: DC | PRN
Start: 2020-03-04 — End: 2020-03-12

## 2020-03-04 MED ORDER — HEPARIN SOD (PORK) LOCK FLUSH 100 UNIT/ML IV SOLN
500.0000 [IU] | Freq: Once | INTRAVENOUS | Status: AC | PRN
  Administered 2020-03-04: 500 [IU]
  Filled 2020-03-04: qty 5

## 2020-03-04 MED ORDER — ACETAMINOPHEN 325 MG PO TABS
ORAL_TABLET | ORAL | Status: AC
  Filled 2020-03-04: qty 2

## 2020-03-04 MED ORDER — ACETAMINOPHEN 325 MG PO TABS
ORAL_TABLET | ORAL | Status: AC
  Filled 2020-03-04: qty 1

## 2020-03-04 MED ORDER — ACETAMINOPHEN 325 MG PO TABS
650.0000 mg | ORAL_TABLET | Freq: Once | ORAL | Status: AC
  Administered 2020-03-04: 650 mg via ORAL

## 2020-03-04 MED ORDER — DIPHENHYDRAMINE HCL 25 MG PO CAPS
50.0000 mg | ORAL_CAPSULE | Freq: Once | ORAL | Status: AC
  Administered 2020-03-04: 50 mg via ORAL

## 2020-03-04 MED ORDER — SODIUM CHLORIDE 0.9 % IV SOLN
Freq: Once | INTRAVENOUS | Status: AC
  Filled 2020-03-04: qty 250

## 2020-03-04 MED ORDER — TRASTUZUMAB-DKST CHEMO 150 MG IV SOLR
6.0000 mg/kg | Freq: Once | INTRAVENOUS | Status: AC
  Administered 2020-03-04: 630 mg via INTRAVENOUS
  Filled 2020-03-04: qty 30

## 2020-03-04 NOTE — Progress Notes (Signed)
Patient completed infusion without incident. Declined 30 minute post observation. In no visible distress at time of discharge. Ambulated out of cancer center. AVS provided.

## 2020-03-04 NOTE — Progress Notes (Signed)
I saw the patient today following her treatment.  She has subsequently completed 28 fractions, and starts her boost tomorrow.  Her axilla is the most irritated today, there is now moist desquamation in an area approximately 4 x 3 cm in greatest dimension.  Her inframammary site continues to have cell islands and dry desquamation. her supraclavicular site is hyperpigmented but the previous desquamation has resolved.  We discussed the rationale for hydrogel pads and these were given to the patient and applied today.  She can alternate this with Silvadene.  She was also given a new prescription sent into her pharmacy for oxycodone 5 mg to be taken 1 p.o. every 6 hours as needed pain #30 without refills, she was encouraged that her symptoms should improve significantly in the next week, to where she can discontinue further opiates.  She is hoping to avoid this anyway as she is still trying to work, and she is aware she should not drive or go to work when taking pain medication, that it should be 4 hours after taking it before she can do these things.  She is in agreement and plans to only take this after work and at nighttime as she has been waking up in pain.  I will follow-up with her in a few weeks or sooner if needed to see how she is doing.

## 2020-03-04 NOTE — Patient Instructions (Signed)
[place] Discharge Instructions for Patients Receiving Chemotherapy  Today you received the following chemotherapy agents: trastuzumab and pertuzumab.   To help prevent nausea and vomiting after your treatment, we encourage you to take your nausea medication as directed.   If you develop nausea and vomiting that is not controlled by your nausea medication, call the clinic.   BELOW ARE SYMPTOMS THAT SHOULD BE REPORTED IMMEDIATELY:  *FEVER GREATER THAN 100.5 F  *CHILLS WITH OR WITHOUT FEVER  NAUSEA AND VOMITING THAT IS NOT CONTROLLED WITH YOUR NAUSEA MEDICATION  *UNUSUAL SHORTNESS OF BREATH  *UNUSUAL BRUISING OR BLEEDING  TENDERNESS IN MOUTH AND THROAT WITH OR WITHOUT PRESENCE OF ULCERS  *URINARY PROBLEMS  *BOWEL PROBLEMS  UNUSUAL RASH Items with * indicate a potential emergency and should be followed up as soon as possible.  Feel free to call the clinic should you have any questions or concerns. The clinic phone number is [phone].  Please show the [place] at check-in to the Emergency Department and triage nurse.

## 2020-03-05 ENCOUNTER — Ambulatory Visit
Admission: RE | Admit: 2020-03-05 | Discharge: 2020-03-05 | Disposition: A | Discharge status: Home / Self Care | Payer: BC Managed Care – PPO | Source: Ambulatory Visit | Attending: Radiation Oncology | Admitting: Radiation Oncology

## 2020-03-05 ENCOUNTER — Ambulatory Visit: Payer: BC Managed Care – PPO

## 2020-03-05 DIAGNOSIS — C50411 Malignant neoplasm of upper-outer quadrant of right female breast: Secondary | ICD-10-CM | POA: Diagnosis not present

## 2020-03-06 ENCOUNTER — Ambulatory Visit
Admission: RE | Admit: 2020-03-06 | Discharge: 2020-03-06 | Disposition: A | Discharge status: Home / Self Care | Payer: BC Managed Care – PPO | Source: Ambulatory Visit | Attending: Radiation Oncology | Admitting: Radiation Oncology

## 2020-03-06 ENCOUNTER — Other Ambulatory Visit: Payer: Self-pay

## 2020-03-06 DIAGNOSIS — C50411 Malignant neoplasm of upper-outer quadrant of right female breast: Secondary | ICD-10-CM | POA: Diagnosis not present

## 2020-03-07 ENCOUNTER — Ambulatory Visit
Admission: RE | Admit: 2020-03-07 | Discharge: 2020-03-07 | Disposition: A | Discharge status: Home / Self Care | Payer: BC Managed Care – PPO | Source: Ambulatory Visit | Attending: Radiation Oncology | Admitting: Radiation Oncology

## 2020-03-07 DIAGNOSIS — C50411 Malignant neoplasm of upper-outer quadrant of right female breast: Secondary | ICD-10-CM

## 2020-03-07 MED ORDER — SILVER SULFADIAZINE 1 % EX CREA: TOPICAL_CREAM | Freq: Once | CUTANEOUS | Status: AC

## 2020-03-08 ENCOUNTER — Ambulatory Visit: Payer: BC Managed Care – PPO

## 2020-03-11 ENCOUNTER — Ambulatory Visit: Payer: BC Managed Care – PPO

## 2020-03-11 ENCOUNTER — Encounter: Payer: Self-pay | Admitting: *Deleted

## 2020-03-11 ENCOUNTER — Telehealth: Payer: Self-pay | Admitting: Radiation Oncology

## 2020-03-11 ENCOUNTER — Ambulatory Visit
Admission: RE | Admit: 2020-03-11 | Discharge: 2020-03-11 | Disposition: A | Discharge status: Home / Self Care | Payer: BC Managed Care – PPO | Source: Ambulatory Visit | Attending: Radiation Oncology | Admitting: Radiation Oncology

## 2020-03-11 DIAGNOSIS — C50411 Malignant neoplasm of upper-outer quadrant of right female breast: Secondary | ICD-10-CM | POA: Diagnosis not present

## 2020-03-11 NOTE — Telephone Encounter (Signed)
I called and spoke with the patient. She is really having a tough time with her moist desquamation. She's out of hydrogel pads but we discussed the importance of trying to finish her treatment if she is willing. She is willing and will try to come today and tomorrow for her last two treatments. We will give an additional pack of hydrogel and silvadene.

## 2020-03-12 ENCOUNTER — Ambulatory Visit
Admission: RE | Admit: 2020-03-12 | Discharge: 2020-03-12 | Disposition: A | Discharge status: Home / Self Care | Payer: BC Managed Care – PPO | Source: Ambulatory Visit | Attending: Radiation Oncology | Admitting: Radiation Oncology

## 2020-03-12 ENCOUNTER — Other Ambulatory Visit: Payer: Self-pay | Admitting: Radiation Oncology

## 2020-03-12 ENCOUNTER — Encounter: Payer: Self-pay | Admitting: Radiation Oncology

## 2020-03-12 DIAGNOSIS — C50411 Malignant neoplasm of upper-outer quadrant of right female breast: Secondary | ICD-10-CM | POA: Diagnosis not present

## 2020-03-12 MED ORDER — OXYCODONE HCL 5 MG PO TABS: 5.0000 mg | ORAL_TABLET | ORAL | 0 refills | Status: DC | PRN

## 2020-03-13 ENCOUNTER — Other Ambulatory Visit (HOSPITAL_COMMUNITY): Payer: BC Managed Care – PPO

## 2020-03-14 ENCOUNTER — Telehealth: Payer: Self-pay | Admitting: *Deleted

## 2020-03-14 NOTE — Telephone Encounter (Signed)
CALLED PATIENT TO INFORM OF FU ON 03-21-20 WITH DR. MOODY @ 10 AM, SPOKE WITH PATIENT AND SHE AGREED TO THIS DAY AND TIME

## 2020-03-21 ENCOUNTER — Ambulatory Visit
Admission: RE | Admit: 2020-03-21 | Discharge: 2020-03-21 | Disposition: A | Discharge status: Home / Self Care | Payer: BC Managed Care – PPO | Source: Ambulatory Visit | Attending: Radiation Oncology | Admitting: Radiation Oncology

## 2020-03-21 ENCOUNTER — Other Ambulatory Visit: Payer: Self-pay

## 2020-03-21 NOTE — Progress Notes (Signed)
The patient is a week and a half out from her right breast and regional nodal irradiation. Her skin was quite irritated with moist desquamation. Now however the area has sealed over and is no longer draining. There are two small areas of cell islands still present that measured about 5 mm in the inframammary fold. She is using silvadene and radioplex and denies any pain. She will continue her current skin regimen until the cell islands are no longer present then she can switch to any OTC creams or lotions she would like.

## 2020-03-25 ENCOUNTER — Other Ambulatory Visit: Payer: Self-pay

## 2020-03-25 ENCOUNTER — Other Ambulatory Visit: Payer: Self-pay | Admitting: *Deleted

## 2020-03-25 ENCOUNTER — Ambulatory Visit: Payer: BC Managed Care – PPO | Attending: General Surgery | Admitting: Rehabilitation

## 2020-03-25 DIAGNOSIS — Z170 Estrogen receptor positive status [ER+]: Secondary | ICD-10-CM

## 2020-03-25 DIAGNOSIS — Z483 Aftercare following surgery for neoplasm: Secondary | ICD-10-CM | POA: Insufficient documentation

## 2020-03-25 NOTE — Therapy (Signed)
[place], [place], [address] Phone: [phone]   Fax:  [phone]  Physical Therapy Treatment  Patient Details  Name: Heidi Costa MRN: [id number] Date of Birth: Jul 13, 1980 Referring Provider (PT): [name]   Encounter Date: 03/25/2020   PT End of Session - 03/25/20 1544    Visit Number 8   screen only   PT Start Time 5956    PT Stop Time 3875    PT Time Calculation (min) 7 min           Past Medical History:  Diagnosis Date  . Abnormality of gait 08/02/2012   resolved per patient on 07/12/19  . Anemia    iron taking in the past, not currently  . Anxiety   . Arthritis    spine- cerv.  & arm   . Asthma   . Bulging lumbar disc   . Cancer [facility])    Right Breast-starts chemo on 07/14/19  . Chronic arthralgias of knees and hips 08/02/2012  . Depression    Pt. took self off Celexa one month ago, states she did n't like how it made her feel .  Marland Kitchen Family history of cancer   . GERD (gastroesophageal reflux disease)    diet controlled  . Heart murmur    pt. been told that this is a fact, no echo or cardiac surveillance in the past   . HLD (hyperlipidemia)    no meds, diet controlled  . Hypertension   . Obesity   . Spinal stenosis     Past Surgical History:  Procedure Laterality Date  . ANTERIOR CERVICAL DECOMP/DISCECTOMY FUSION N/A 10/27/2012   Procedure: ANTERIOR CERVICAL DECOMPRESSION/DISCECTOMY FUSION 1 LEVEL;  Surgeon: Faythe Ghee, MD;  Location: [place] NEURO ORS;  Service: Neurosurgery;  Laterality: N/A;  ANTERIOR CERVICAL DECOMPRESSION/DISCECTOMY FUSION 1 LEVEL  . BREAST LUMPECTOMY WITH RADIOACTIVE SEED AND SENTINEL LYMPH NODE BIOPSY Right 12/13/2019   Procedure: RIGHT BREAST LUMPECTOMY WITH RADIOACTIVE SEED AND RIGHT AXILLARY  SENTINEL LYMPH NODE BIOPSY, RIGHT AXILLARY NODE SEED GUIDED EXCISION, BLUE DYE INJECTION;  Surgeon: Rolm Bookbinder, MD;  Location: [place];  Service: General;  Laterality: Right;  PEC BLOCK  . CHALAZION EXCISION     L eye   . PORTACATH PLACEMENT Left 07/13/2019   Procedure: INSERTION PORT-A-CATH WITH ULTRASOUND;  Surgeon: Rolm Bookbinder, MD;  Location: [place];  Service: General;  Laterality: Left;  Marland Kitchen VAGINAL DELIVERY  2002  . WISDOM TOOTH EXTRACTION      There were no vitals filed for this visit.           L-DEX FLOWSHEETS - 03/25/20 1500      L-DEX LYMPHEDEMA SCREENING   Measurement Type Unilateral    L-DEX MEASUREMENT EXTREMITY Upper Extremity    POSITION  Standing    DOMINANT SIDE Right    At Risk Side Right    BASELINE SCORE (UNILATERAL) -1    L-DEX SCORE (UNILATERAL) -1.7    VALUE CHANGE (UNILAT) -0.7             Flowsheet Row Outpatient Re[facility]  Lymphedema Life Impact Scale Total Score 7.35 %                         PT Long Term Goals - 01/26/20 1014      PT LONG TERM GOAL #1   Title Patient will demonstrate she has  regained full shoulder ROM and function post operatively compared to baselines.    Baseline achieved all except IR behind back    Time 4    Period Weeks    Status Partially Met    Target Date 03/08/20      PT LONG TERM GOAL #2   Title Pt will report a 75% improvement in comfort of RUE to allow pt to reach overhead without discomfort from cording.    Baseline 85% better 01/19/20    Time 4    Period Weeks    Status Achieved      PT LONG TERM GOAL #3   Title Pt will demonstrate a 75% improvement in scar tissue in area of SLNB scar to allow improved comfort.    Baseline 100% better    Time 4    Period Weeks    Status Achieved      PT LONG TERM GOAL #4   Title Pt will maintain full ROM, and decreased cording as she continues radiation    Time 6    Period Weeks    Status New    Target Date 03/08/20                 Plan - 03/25/20 1544    Clinical Impression Statement Pt  measured within recommended range of baseline at this [facility] screen and will return in 3 months to continue lymphedema screening.    Consulted and Agree with Plan of Care Patient           Patient will benefit from skilled therapeutic intervention in order to improve the following deficits and impairments:     Visit Diagnosis: Aftercare following surgery for neoplasm     Problem List Patient Active Problem List   Diagnosis Date Noted  . Depression, major, single episode, moderate ([place]) 10/17/2019  . Mild persistent asthma without complication 94/80/1655  . Port-A-Cath in place 08/04/2019  . Genetic testing 07/24/2019  . Family history of cancer   . Dysphagia 06/23/2019  . Malignant neoplasm of upper-outer quadrant of right breast in female, estrogen receptor positive ([place]) 06/21/2019  . Tobacco consumption 06/06/2019  . Breast mass, right 06/06/2019  . Sensation of change in body temperature 06/06/2019  . Neuropathic pain 06/06/2019  . Bite, insect 10/19/2016  . Swelling of eyelid, left 10/19/2016  . Essential hypertension 08/10/2016  . Disturbance of skin sensation 05/08/2013  . Cervical spondylosis with myelopathy 05/08/2013  . Abnormality of gait 08/02/2012  . Chronic arthralgias of knees and hips 08/02/2012    Stark Bray 03/25/2020, 3:45 PM  [place], [place], [address] Phone: [phone]   Fax:  [phone]  Name: Heidi Costa MRN: [id number] Date of Birth: [birth year]

## 2020-03-26 ENCOUNTER — Inpatient Hospital Stay: Payer: BC Managed Care – PPO

## 2020-03-26 ENCOUNTER — Other Ambulatory Visit: Payer: Self-pay

## 2020-03-26 ENCOUNTER — Other Ambulatory Visit: Payer: Self-pay | Admitting: Oncology

## 2020-03-26 VITALS — BP 131/65 | HR 52 | Temp 98.4°F | Resp 18

## 2020-03-26 DIAGNOSIS — C50411 Malignant neoplasm of upper-outer quadrant of right female breast: Secondary | ICD-10-CM

## 2020-03-26 DIAGNOSIS — Z170 Estrogen receptor positive status [ER+]: Secondary | ICD-10-CM

## 2020-03-26 DIAGNOSIS — Z95828 Presence of other vascular implants and grafts: Secondary | ICD-10-CM

## 2020-03-26 LAB — CMP (CANCER CENTER ONLY)
ALT: 9 U/L (ref 0–44)
AST: 12 U/L — ABNORMAL LOW (ref 15–41)
Albumin: 3.4 g/dL — ABNORMAL LOW (ref 3.5–5.0)
Alkaline Phosphatase: 74 U/L (ref 38–126)
Anion gap: 6 (ref 5–15)
BUN: 7 mg/dL (ref 6–20)
CO2: 26 mmol/L (ref 22–32)
Calcium: 8.9 mg/dL (ref 8.9–10.3)
Chloride: 109 mmol/L (ref 98–111)
Creatinine: 0.79 mg/dL (ref 0.44–1.00)
GFR, Estimated: >60 mL/min (ref >60)
Glucose, Bld: 90 mg/dL (ref 70–99)
Potassium: 3.7 mmol/L (ref 3.5–5.1)
Sodium: 141 mmol/L (ref 135–145)
Total Bilirubin: 0.3 mg/dL (ref 0.3–1.2)
Total Protein: 6.8 g/dL (ref 6.5–8.1)

## 2020-03-26 LAB — CBC WITH DIFFERENTIAL (CANCER CENTER ONLY)
Abs Immature Granulocytes: 0.01 10*3/uL (ref 0.00–0.07)
Basophils Absolute: 0 10*3/uL (ref 0.0–0.1)
Basophils Relative: 1 %
Eosinophils Absolute: 0.2 10*3/uL (ref 0.0–0.5)
Eosinophils Relative: 4 %
HCT: 34.8 % — ABNORMAL LOW (ref 36.0–46.0)
Hemoglobin: 11.8 g/dL — ABNORMAL LOW (ref 12.0–15.0)
Immature Granulocytes: 0 %
Lymphocytes Relative: 32 %
Lymphs Abs: 1.2 10*3/uL (ref 0.7–4.0)
MCH: 32.8 pg (ref 26.0–34.0)
MCHC: 33.9 g/dL (ref 30.0–36.0)
MCV: 96.7 fL (ref 80.0–100.0)
Monocytes Absolute: 0.4 10*3/uL (ref 0.1–1.0)
Monocytes Relative: 9 %
Neutro Abs: 2.1 10*3/uL (ref 1.7–7.7)
Neutrophils Relative %: 54 %
Platelet Count: 333 10*3/uL (ref 150–400)
RBC: 3.6 MIL/uL — ABNORMAL LOW (ref 3.87–5.11)
RDW: 14.2 % (ref 11.5–15.5)
WBC Count: 3.9 10*3/uL — ABNORMAL LOW (ref 4.0–10.5)
nRBC: 0 % (ref 0.0–0.2)

## 2020-03-26 LAB — PREGNANCY, URINE: Preg Test, Ur: NEGATIVE

## 2020-03-26 MED ORDER — TRASTUZUMAB-DKST CHEMO 150 MG IV SOLR
6.0000 mg/kg | Freq: Once | INTRAVENOUS | Status: AC
  Administered 2020-03-26: 15:00:00 630 mg via INTRAVENOUS
  Filled 2020-03-26: qty 30

## 2020-03-26 MED ORDER — SODIUM CHLORIDE 0.9 % IV SOLN
Freq: Once | INTRAVENOUS | Status: AC
  Filled 2020-03-26: qty 250

## 2020-03-26 MED ORDER — HEPARIN SOD (PORK) LOCK FLUSH 100 UNIT/ML IV SOLN
500.0000 [IU] | Freq: Once | INTRAVENOUS | Status: AC | PRN
  Administered 2020-03-26: 16:00:00 500 [IU]
  Filled 2020-03-26: qty 5

## 2020-03-26 MED ORDER — DIPHENHYDRAMINE HCL 25 MG PO CAPS
ORAL_CAPSULE | ORAL | Status: AC
  Filled 2020-03-26: qty 2

## 2020-03-26 MED ORDER — SODIUM CHLORIDE 0.9% FLUSH
10.0000 mL | INTRAVENOUS | Status: DC | PRN
  Administered 2020-03-26: 16:00:00 10 mL
  Filled 2020-03-26: qty 10

## 2020-03-26 MED ORDER — SODIUM CHLORIDE 0.9% FLUSH
10.0000 mL | Freq: Once | INTRAVENOUS | Status: AC
  Administered 2020-03-26: 10 mL
  Filled 2020-03-26: qty 10

## 2020-03-26 MED ORDER — ACETAMINOPHEN 325 MG PO TABS
ORAL_TABLET | ORAL | Status: AC
  Filled 2020-03-26: qty 2

## 2020-03-26 MED ORDER — ACETAMINOPHEN 325 MG PO TABS
650.0000 mg | ORAL_TABLET | Freq: Once | ORAL | Status: AC
  Administered 2020-03-26: 14:00:00 650 mg via ORAL

## 2020-03-26 MED ORDER — DIPHENHYDRAMINE HCL 25 MG PO CAPS
50.0000 mg | ORAL_CAPSULE | Freq: Once | ORAL | Status: AC
  Administered 2020-03-26: 14:00:00 50 mg via ORAL

## 2020-03-26 MED ORDER — SODIUM CHLORIDE 0.9 % IV SOLN
420.0000 mg | Freq: Once | INTRAVENOUS | Status: AC
  Administered 2020-03-26: 16:00:00 420 mg via INTRAVENOUS
  Filled 2020-03-26: qty 14

## 2020-03-26 NOTE — Patient Instructions (Signed)
[facility] Discharge Instructions for Patients Receiving Chemotherapy  Today you received the following chemotherapy agents: trastuzumab, pertuzumab To help prevent nausea and vomiting after your treatment, we encourage you to take your nausea medication as directed.   If you develop nausea and vomiting that is not controlled by your nausea medication, call the clinic.   BELOW ARE SYMPTOMS THAT SHOULD BE REPORTED IMMEDIATELY:  *FEVER GREATER THAN 100.5 F  *CHILLS WITH OR WITHOUT FEVER  NAUSEA AND VOMITING THAT IS NOT CONTROLLED WITH YOUR NAUSEA MEDICATION  *UNUSUAL SHORTNESS OF BREATH  *UNUSUAL BRUISING OR BLEEDING  TENDERNESS IN MOUTH AND THROAT WITH OR WITHOUT PRESENCE OF ULCERS  *URINARY PROBLEMS  *BOWEL PROBLEMS  UNUSUAL RASH Items with * indicate a potential emergency and should be followed up as soon as possible.  Feel free to call the clinic should you have any questions or concerns. The clinic phone number is [phone].  Please show the CHEMO ALERT CARD at check-in to the Emergency Department and triage nurse.   

## 2020-04-02 ENCOUNTER — Other Ambulatory Visit: Payer: Self-pay

## 2020-04-02 ENCOUNTER — Ambulatory Visit (HOSPITAL_COMMUNITY)
Admission: RE | Admit: 2020-04-02 | Discharge: 2020-04-02 | Disposition: A | Discharge status: Home / Self Care | Payer: BC Managed Care – PPO | Source: Ambulatory Visit | Attending: Hematology and Oncology | Admitting: Hematology and Oncology

## 2020-04-02 DIAGNOSIS — E785 Hyperlipidemia, unspecified: Secondary | ICD-10-CM | POA: Insufficient documentation

## 2020-04-02 DIAGNOSIS — Z0189 Encounter for other specified special examinations: Secondary | ICD-10-CM | POA: Diagnosis not present

## 2020-04-02 DIAGNOSIS — Z170 Estrogen receptor positive status [ER+]: Secondary | ICD-10-CM | POA: Diagnosis present

## 2020-04-02 DIAGNOSIS — E669 Obesity, unspecified: Secondary | ICD-10-CM | POA: Diagnosis not present

## 2020-04-02 DIAGNOSIS — I10 Essential (primary) hypertension: Secondary | ICD-10-CM | POA: Insufficient documentation

## 2020-04-02 DIAGNOSIS — Z87891 Personal history of nicotine dependence: Secondary | ICD-10-CM | POA: Diagnosis not present

## 2020-04-02 DIAGNOSIS — C50411 Malignant neoplasm of upper-outer quadrant of right female breast: Secondary | ICD-10-CM | POA: Diagnosis not present

## 2020-04-02 DIAGNOSIS — I2510 Atherosclerotic heart disease of native coronary artery without angina pectoris: Secondary | ICD-10-CM | POA: Insufficient documentation

## 2020-04-02 LAB — ECHOCARDIOGRAM COMPLETE
Area-P 1/2: 4.1 cm2
S' Lateral: 3.3 cm

## 2020-04-15 ENCOUNTER — Inpatient Hospital Stay: Payer: BC Managed Care – PPO

## 2020-04-15 ENCOUNTER — Telehealth: Payer: Self-pay | Admitting: Radiation Oncology

## 2020-04-15 ENCOUNTER — Telehealth: Payer: Self-pay

## 2020-04-15 NOTE — Telephone Encounter (Addendum)
  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   Interval Since Last Radiation:  6 weeks   01/22/20-03/12/20: The right breast and regional nodes were treated to 50.4 Gy in 28 fractions followed by a 10 Gy boost in 5 fractions.  Narrative:  The patient was contacted today for routine follow-up. During treatment she did very well with radiotherapy but did have significant desquamation requiring silvadene and hydrogel pads. She was seen last about 4 weeks ago and was doing much better. Her skin had almost completely sealed over. Today she states her skin has improved significantly since our last visit. She is having some possible swelling of her breast off an on.  Impression/Plan: 1. 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 The patient has been doing well since completion of radiotherapy. We discussed that we would be happy to continue to follow her as needed, but she will also continue to follow up with Dr. Lindi Adie in medical oncology. She was counseled on skin care as well as measures to avoid sun exposure to this area.   2. Concerns for swelling. She will call PT to follow up on swelling she thinks may be happening in the breast.    Carola Rhine, PAC

## 2020-04-15 NOTE — Telephone Encounter (Signed)
Patient did not show for oncology appt today. Multiple attempts to reach patient via telephone with no answer. In Basket message sent to scheduling dept to reschedule patient for another day this week.

## 2020-04-15 NOTE — Telephone Encounter (Incomplete Revision)
  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   Interval Since Last Radiation:  5 weeks   01/22/20-03/12/20: The right breast and regional nodes were treated to 50.4 Gy in 28 fractions followed by a 10 Gy boost in 5 fractions.  Narrative:  The patient was contacted today for routine follow-up. During treatment she did very well with radiotherapy but did have significant desquamation requiring silvadene and hydrogel pads. She was seen last about 4 weeks ago and was doing much better. Her skin had almost completely sealed over. Today she states ***  Impression/Plan: 1. 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 The patient has been doing well since completion of radiotherapy. We discussed that we would be happy to continue to follow her as needed, but she will also continue to follow up with Dr. Lindi Adie in medical oncology. She was counseled on skin care as well as measures to avoid sun exposure to this area.      Carola Rhine, PAC

## 2020-04-19 ENCOUNTER — Other Ambulatory Visit: Payer: Self-pay | Admitting: Hematology and Oncology

## 2020-04-21 ENCOUNTER — Other Ambulatory Visit: Payer: Self-pay

## 2020-04-21 ENCOUNTER — Inpatient Hospital Stay: Payer: BC Managed Care – PPO | Attending: Hematology and Oncology

## 2020-04-21 VITALS — BP 125/83 | HR 57 | Temp 98.6°F | Resp 16

## 2020-04-21 DIAGNOSIS — C50411 Malignant neoplasm of upper-outer quadrant of right female breast: Secondary | ICD-10-CM | POA: Insufficient documentation

## 2020-04-21 DIAGNOSIS — Z5112 Encounter for antineoplastic immunotherapy: Secondary | ICD-10-CM | POA: Insufficient documentation

## 2020-04-21 DIAGNOSIS — Z170 Estrogen receptor positive status [ER+]: Secondary | ICD-10-CM | POA: Diagnosis not present

## 2020-04-21 MED ORDER — ACETAMINOPHEN 325 MG PO TABS
650.0000 mg | ORAL_TABLET | Freq: Once | ORAL | Status: AC
  Administered 2020-04-21: 650 mg via ORAL

## 2020-04-21 MED ORDER — HEPARIN SOD (PORK) LOCK FLUSH 100 UNIT/ML IV SOLN
500.0000 [IU] | Freq: Once | INTRAVENOUS | Status: AC | PRN
  Administered 2020-04-21: 500 [IU]
  Filled 2020-04-21: qty 5

## 2020-04-21 MED ORDER — SODIUM CHLORIDE 0.9 % IV SOLN
Freq: Once | INTRAVENOUS | Status: AC
  Filled 2020-04-21: qty 250

## 2020-04-21 MED ORDER — DIPHENHYDRAMINE HCL 25 MG PO CAPS
50.0000 mg | ORAL_CAPSULE | Freq: Once | ORAL | Status: AC
  Administered 2020-04-21: 50 mg via ORAL

## 2020-04-21 MED ORDER — ACETAMINOPHEN 325 MG PO TABS
ORAL_TABLET | ORAL | Status: AC
  Filled 2020-04-21: qty 2

## 2020-04-21 MED ORDER — TRASTUZUMAB-DKST CHEMO 150 MG IV SOLR
6.0000 mg/kg | Freq: Once | INTRAVENOUS | Status: AC
  Administered 2020-04-21: 630 mg via INTRAVENOUS
  Filled 2020-04-21: qty 30

## 2020-04-21 MED ORDER — SODIUM CHLORIDE 0.9 % IV SOLN
420.0000 mg | Freq: Once | INTRAVENOUS | Status: AC
  Administered 2020-04-21: 420 mg via INTRAVENOUS
  Filled 2020-04-21: qty 14

## 2020-04-21 MED ORDER — DIPHENHYDRAMINE HCL 25 MG PO CAPS
ORAL_CAPSULE | ORAL | Status: AC
  Filled 2020-04-21: qty 2

## 2020-04-21 MED ORDER — SODIUM CHLORIDE 0.9% FLUSH
10.0000 mL | INTRAVENOUS | Status: DC | PRN
  Administered 2020-04-21: 10 mL
  Filled 2020-04-21: qty 10

## 2020-04-21 NOTE — Progress Notes (Signed)
Pt had a period this month.  Pt states no chance she is currently pregnant. We will proceed w/ tx.  Kennith Center, Pharm.D., CPP 04/21/2020@10 :10 AM

## 2020-04-21 NOTE — Patient Instructions (Signed)
[facility] Discharge Instructions for Patients Receiving Chemotherapy  Today you received the following chemotherapy agents: trastuzumab/pertuzumab.  To help prevent nausea and vomiting after your treatment, we encourage you to take your nausea medication as directed.   If you develop nausea and vomiting that is not controlled by your nausea medication, call the clinic.   BELOW ARE SYMPTOMS THAT SHOULD BE REPORTED IMMEDIATELY:  *FEVER GREATER THAN 100.5 F  *CHILLS WITH OR WITHOUT FEVER  NAUSEA AND VOMITING THAT IS NOT CONTROLLED WITH YOUR NAUSEA MEDICATION  *UNUSUAL SHORTNESS OF BREATH  *UNUSUAL BRUISING OR BLEEDING  TENDERNESS IN MOUTH AND THROAT WITH OR WITHOUT PRESENCE OF ULCERS  *URINARY PROBLEMS  *BOWEL PROBLEMS  UNUSUAL RASH Items with * indicate a potential emergency and should be followed up as soon as possible.  Feel free to call the clinic should you have any questions or concerns. The clinic phone number is [phone].  Please show the CHEMO ALERT CARD at check-in to the Emergency Department and triage nurse.   

## 2020-04-22 ENCOUNTER — Encounter: Payer: Self-pay | Admitting: Registered Nurse

## 2020-04-22 NOTE — Progress Notes (Signed)
Acute Office Visit  Subjective:    Patient ID: Heidi Costa, female    DOB: [birth year], 40 y.o.   MRN: [id number]  Chief Complaint  Patient presents with  . Abdominal Pain    cramp that started on sunday evening 02/04/20 for about 5 min and then again on Thursday 02/08/2020 and last 5 min as well  . Diarrhea    for a week     HPI Patient is in today for abdominal pain and diarrhea Ongoing for around 1 week Improving No blood or mucus in stool Able to stay hydrated Wants to make sure there is nothing serious occurring  No other covid symptoms, no sick contacts  Past Medical History:  Diagnosis Date  . Abnormality of gait 08/02/2012   resolved per patient on 07/12/19  . Anemia    iron taking in the past, not currently  . Anxiety   . Arthritis    spine- cerv.  & arm   . Asthma   . Bulging lumbar disc   . Cancer [facility])    Right Breast-starts chemo on 07/14/19  . Chronic arthralgias of knees and hips 08/02/2012  . Depression    Pt. took self off Celexa one month ago, states she did n't like how it made her feel .  Marland Kitchen Family history of cancer   . GERD (gastroesophageal reflux disease)    diet controlled  . Heart murmur    pt. been told that this is a fact, no echo or cardiac surveillance in the past   . HLD (hyperlipidemia)    no meds, diet controlled  . Hypertension   . Obesity   . Spinal stenosis     Past Surgical History:  Procedure Laterality Date  . ANTERIOR CERVICAL DECOMP/DISCECTOMY FUSION N/A 10/27/2012   Procedure: ANTERIOR CERVICAL DECOMPRESSION/DISCECTOMY FUSION 1 LEVEL;  Surgeon: Faythe Ghee, MD;  Location: [place] [facility];  Service: Neurosurgery;  Laterality: N/A;  ANTERIOR CERVICAL DECOMPRESSION/DISCECTOMY FUSION 1 LEVEL  . BREAST LUMPECTOMY WITH RADIOACTIVE SEED AND SENTINEL LYMPH NODE BIOPSY Right 12/13/2019   Procedure: RIGHT BREAST LUMPECTOMY WITH RADIOACTIVE SEED AND RIGHT AXILLARY  SENTINEL LYMPH NODE BIOPSY, RIGHT AXILLARY NODE SEED GUIDED  EXCISION, BLUE DYE INJECTION;  Surgeon: Rolm Bookbinder, MD;  Location: [place];  Service: General;  Laterality: Right;  PEC BLOCK  . CHALAZION EXCISION     L eye   . PORTACATH PLACEMENT Left 07/13/2019   Procedure: INSERTION PORT-A-CATH WITH ULTRASOUND;  Surgeon: Rolm Bookbinder, MD;  Location: [place];  Service: General;  Laterality: Left;  Marland Kitchen VAGINAL DELIVERY  2002  . WISDOM TOOTH EXTRACTION      Family History  Problem Relation Age of Onset  . Osteoarthritis Mother   . Hypertension Mother   . Diabetes Mother   . Hypertension Father   . Gout Father   . Stroke Father 36       embolic  . Diabetes Half-Brother   . Hypertension Half-Brother   . Heart failure Half-Brother 45  . Kidney disease Half-Brother   . Diabetes Paternal Grandfather   . Cancer Paternal Grandfather 64       breast cancer    Social History   Socioeconomic History  . Marital status: Married    Spouse name: Not on file  . Number of children: 1  . Years of education: associates  . Highest education level: Not on file  Occupational History    Employer: OTHER  Tobacco Use  . Smoking  status: Former Smoker    Packs/day: 1.00    Years: 10.00    Pack years: 10.00    Types: Cigarettes    Quit date: 06/25/2019    Years since quitting: 0.8  . Smokeless tobacco: Never Used  Vaping Use  . Vaping Use: Never used  Substance and Sexual Activity  . Alcohol use: Yes    Comment: occas  . Drug use: Yes    Types: Marijuana    Comment: uses daily - last use 07/12/19  . Sexual activity: Yes    Birth control/protection: None  Other Topics Concern  . Not on file  Social History Narrative   Patient is married with one child.   Patient is right handed.   Patient has a Associate's degree.   Patient drinks 2-3 cups daily.   Social Determinants of Health   Financial Resource Strain: Not on file  Food Insecurity: Not on file  Transportation Needs: Not on file  Physical Activity: Not on file   Stress: Not on file  Social Connections: Not on file  Intimate Partner Violence: Not on file    Outpatient Medications Prior to Visit  Medication Sig Dispense Refill  . acetaminophen (TYLENOL) 500 MG tablet Take 500-1,000 mg by mouth daily as needed for pain.     Marland Kitchen albuterol (VENTOLIN HFA) 108 (90 Base) MCG/ACT inhaler Inhale 2 puffs into the lungs every 6 (six) hours as needed for wheezing or shortness of breath. 18 g 2  . amLODipine (NORVASC) 5 MG tablet Take 1 tablet (5 mg total) by mouth daily. 90 tablet 1  . buPROPion (WELLBUTRIN XL) 300 MG 24 hr tablet Take 1 tablet (300 mg total) by mouth daily. 1 tab qam x 7 days then increase to 2 pills 90 tablet 1  . fluticasone (FLOVENT HFA) 44 MCG/ACT inhaler Inhale 1 puff into the lungs 2 (two) times daily. 1 Inhaler 5  . gabapentin (NEURONTIN) 100 MG capsule Take 1 capsule (100 mg total) by mouth 2 (two) times daily.    Marland Kitchen lidocaine-prilocaine (EMLA) cream SMARTSIG:1 Topical Every Night    . oxyCODONE (OXY IR/ROXICODONE) 5 MG immediate release tablet Take 1 tablet (5 mg total) by mouth every 6 (six) hours as needed. (Patient not taking: Reported on 01/11/2020) 10 tablet 0   No facility-administered medications prior to visit.    Allergies  Allergen Reactions  . Aspirin Rash    Review of Systems Per hpi      Objective:    Physical Exam Vitals and nursing note reviewed.  Constitutional:      General: She is not in acute distress.    Appearance: Normal appearance. She is not ill-appearing, toxic-appearing or diaphoretic.  Cardiovascular:     Rate and Rhythm: Normal rate and regular rhythm.     Pulses: Normal pulses.     Heart sounds: Normal heart sounds. No murmur heard. No friction rub. No gallop.   Pulmonary:     Effort: Pulmonary effort is normal. No respiratory distress.     Breath sounds: Normal breath sounds. No stridor. No wheezing, rhonchi or rales.  Chest:     Chest wall: No tenderness.  Abdominal:     Tenderness:  There is generalized abdominal tenderness (mild).     Hernia: No hernia is present.  Skin:    General: Skin is warm and dry.     Capillary Refill: Capillary refill takes less than 2 seconds.  Neurological:     General: No focal deficit present.  Mental Status: She is alert and oriented to person, place, and time. Mental status is at baseline.  Psychiatric:        Mood and Affect: Mood normal.        Behavior: Behavior normal.        Thought Content: Thought content normal.        Judgment: Judgment normal.     BP (!) 144/92   Pulse 94   Temp 98 F (36.7 C) (Temporal)   Ht 5\' 5"  (1.651 m)   Wt 226 lb 3.2 oz (102.6 kg)   LMP 09/07/2019   SpO2 98%   BMI 37.64 kg/m  Wt Readings from Last 3 Encounters:  03/04/20 223 lb 8 oz (101.4 kg)  02/20/20 222 lb 11.2 oz (101 kg)  02/12/20 225 lb 14.4 oz (102.5 kg)    Health Maintenance Due  Topic Date Due  . Hepatitis C Screening  Never done  . COVID-19 Vaccine (1) Never done    There are no preventive care reminders to display for this patient.   Lab Results  Component Value Date   TSH 2.380 06/06/2019   Lab Results  Component Value Date   WBC 3.9 (L) 03/26/2020   HGB 11.8 (L) 03/26/2020   HCT 34.8 (L) 03/26/2020   MCV 96.7 03/26/2020   PLT 333 03/26/2020   Lab Results  Component Value Date   NA 141 03/26/2020   K 3.7 03/26/2020   CO2 26 03/26/2020   GLUCOSE 90 03/26/2020   BUN 7 03/26/2020   CREATININE 0.79 03/26/2020   BILITOT 0.3 03/26/2020   ALKPHOS 74 03/26/2020   AST 12 (L) 03/26/2020   ALT 9 03/26/2020   PROT 6.8 03/26/2020   ALBUMIN 3.4 (L) 03/26/2020   CALCIUM 8.9 03/26/2020   ANIONGAP 6 03/26/2020   GFR 108.53 08/10/2016   Lab Results  Component Value Date   CHOL 163 08/10/2016   Lab Results  Component Value Date   HDL 36.70 (L) 08/10/2016   Lab Results  Component Value Date   LDLCALC 111 (H) 08/10/2016   Lab Results  Component Value Date   TRIG 74.0 08/10/2016   Lab Results   Component Value Date   CHOLHDL 4 08/10/2016   Lab Results  Component Value Date   HGBA1C 5.8 05/29/2015       Assessment & Plan:   Problem List Items Addressed This Visit   None   Visit Diagnoses    Generalized abdominal pain    -  Primary       No orders of the defined types were placed in this encounter.  PLAN  Feel this is likely viral  Supportive care discussed  Return precautions given  Patient encouraged to call clinic with any questions, comments, or concerns.   Maximiano Coss, NP

## 2020-04-23 NOTE — Progress Notes (Signed)
  Radiation Oncology         [phone] ________________________________  Name: Heidi Costa MRN: [id number]  Date: 03/12/2020  DOB: 08/19/1980  End of Treatment Note  Diagnosis:   right-sided breast cancer     Indication for treatment:  Curative       Radiation treatment dates:   01/22/20 - 03/12/20  Site/dose:   The patient initially received a dose of 50.4 Gy in 28 fractions to the breast using whole-breast tangent fields. This was delivered using a 3-D conformal technique. The patient then received a boost to the seroma. This delivered an additional 10 Gy in 5 fractions using a 3-field photon boost technique. The total dose was 60.4 Gy.   Narrative: The patient tolerated radiation treatment relatively well.   The patient had some expected skin irritation as she progressed during treatment.     Plan: The patient has completed radiation treatment. The patient will return to radiation oncology clinic for routine followup in one month. I advised the patient to call or return sooner if they have any questions or concerns related to their recovery or treatment. ________________________________  Jodelle Gross, M.D., Ph.D.

## 2020-04-30 NOTE — Assessment & Plan Note (Deleted)
06/21/2019:Patient palpated a painful right breast lump x3 weeks. Mammogram showed a 2.0cm right breast mass at the 10 o'clock position, a 3.6cm enlarged right axillary lymph node, and a 0.5cm left breast mass. Biopsy showed, in the right breast and axilla(lymph node), IDC, grade 3, HER-2 equivocal by IHC, + by FISH, ER+ 40%, PR - 0%, Ki67 80%,  left breast, fibrocystic changes, no malignancy.Can be followed.  Treatment plan: 1. Neoadjuvant chemotherapy with Spearman completed 7/8/2021followed by HerceptinPerjeta versus Kadcylamaintenance for 1 yearstarted 07/12/2019 2. breast conserving surgery if possible with sentinel lymph node study: 12/13/2019: Pathologic complete response, 0/5 lymph nodes negative 3. Followed by adjuvant radiation therapycompleted 03/12/2020 4.Followed by antiestrogen therapy  Genetic counseling Breast MRI on 07/07/19 showed the 2.9cm biopsy-proven malignancy in the right breast and axilla, highly suspicious non-mass enhancement in the lateral right breast spanning 14.5cm, and no left breast malignancy ----------------------------------------------------------------------------------------------------------------------------------------------------------- Current treatment: Herceptin Perjeta maintenance  Herceptin Perjeta toxicities: Diarrhea and cramps, I sent a prescription for Bentyl for the cramps and probiotics as well. Hot flashes: Gabapentin is helping. Echocardiogram 04/02/2020: EF 60-65%  Tamoxifen counseling:We discussed the risks and benefits of tamoxifen. These include but not limited to insomnia, hot flashes, mood changes, vaginal dryness, and weight gain. Although rare, serious side effects including endometrial cancer, risk of blood clots were also discussed. We strongly believe that the benefits far outweigh the risks. Patient understands these risks and consented to starting treatment. Planned treatment duration is 10 years.  . Return  to clinic every 3 weeks for Herceptin Perjeta every 6 weeks for follow-up with me.

## 2020-05-02 ENCOUNTER — Encounter: Payer: BC Managed Care – PPO | Admitting: Family Medicine

## 2020-05-03 ENCOUNTER — Encounter: Payer: Self-pay | Admitting: *Deleted

## 2020-05-06 ENCOUNTER — Inpatient Hospital Stay: Payer: BC Managed Care – PPO

## 2020-05-06 ENCOUNTER — Inpatient Hospital Stay: Payer: BC Managed Care – PPO | Admitting: Hematology and Oncology

## 2020-05-06 DIAGNOSIS — C50411 Malignant neoplasm of upper-outer quadrant of right female breast: Secondary | ICD-10-CM

## 2020-05-22 NOTE — Progress Notes (Signed)
[facility] Follow up:    System, Provider Not In No address on file   DIAGNOSIS: Cancer Staging Malignant neoplasm of upper-outer quadrant of right breast in female, estrogen receptor positive ([place]) Staging form: Breast, AJCC 8th Edition - Clinical stage from 07/04/2019: Stage IIB (cT2, cN1(f), cM0, G3, ER+, PR-, HER2+) - Signed by Nicholas Lose, MD on 07/05/2019 Stage prefix: Initial diagnosis Method of lymph node assessment: Core biopsy Histologic grading system: 3 grade system   SUMMARY OF ONCOLOGIC HISTORY: Oncology History  Malignant neoplasm of upper-outer quadrant of right breast in female, estrogen receptor positive ([place])  06/21/2019 Initial Diagnosis   Patient palpated a painful right breast lump x3 weeks. Mammogram showed a 2.0cm right breast mass at the 10 o'clock position, a 3.6cm enlarged right axillary lymph node, and a 0.5cm left breast mass. Biopsy showed, in the right breast and axilla, IDC, grade 3, HER-2 equivocal by IHC, + by FISH, ER+ 40%, PR - 0%, Ki67 80%, and in the left breast, fibrocystic changes, no malignancy.    07/04/2019 Cancer Staging   Staging form: Breast, AJCC 8th Edition - Clinical stage from 07/04/2019: Stage IIB (cT2, cN1(f), cM0, G3, ER+, PR-, HER2+) - Signed by Nicholas Lose, MD on 07/05/2019   07/14/2019 - 10/07/2019 Chemotherapy   The patient had dexamethasone (DECADRON) 4 MG tablet, 4 mg (100 % of original dose 4 mg), Oral, Daily, 1 of 1 cycle, Start date: 07/05/2019, End date: 08/04/2019 Dose modification: 4 mg (original dose 4 mg, Cycle 0) palonosetron (ALOXI) injection 0.25 mg, 0.25 mg, Intravenous,  Once, 5 of 5 cycles Administration: 0.25 mg (07/14/2019), 0.25 mg (08/04/2019), 0.25 mg (10/05/2019), 0.25 mg (08/25/2019), 0.25 mg (09/14/2019) pegfilgrastim-jmdb (FULPHILA) injection 6 mg, 6 mg, Subcutaneous,  Once, 5 of 5 cycles Administration: 6 mg (07/17/2019), 6 mg (08/07/2019), 6 mg (10/07/2019), 6 mg (08/29/2019), 6 mg  (09/16/2019) CARBOplatin (PARAPLATIN) 700 mg in sodium chloride 0.9 % 250 mL chemo infusion, 700 mg (100 % of original dose 700 mg), Intravenous,  Once, 5 of 5 cycles Dose modification: 700 mg (original dose 700 mg, Cycle 1, Reason: Provider Judgment), 600 mg (original dose 700 mg, Cycle 2, Reason: Dose not tolerated) Administration: 700 mg (07/14/2019), 600 mg (08/04/2019), 600 mg (10/05/2019), 600 mg (08/25/2019), 600 mg (09/14/2019) DOCEtaxel (TAXOTERE) 170 mg in sodium chloride 0.9 % 250 mL chemo infusion, 75 mg/m2 = 170 mg, Intravenous,  Once, 5 of 5 cycles Dose modification: 65 mg/m2 (original dose 75 mg/m2, Cycle 2, Reason: Dose not tolerated) Administration: 170 mg (07/14/2019), 150 mg (08/04/2019), 150 mg (10/05/2019), 150 mg (08/25/2019), 150 mg (09/14/2019) fosaprepitant (EMEND) 150 mg in sodium chloride 0.9 % 145 mL IVPB, 150 mg, Intravenous,  Once, 5 of 5 cycles Administration: 150 mg (07/14/2019), 150 mg (08/04/2019), 150 mg (10/05/2019), 150 mg (08/25/2019), 150 mg (09/14/2019) pertuzumab (PERJETA) 840 mg in sodium chloride 0.9 % 250 mL chemo infusion, 840 mg, Intravenous, Once, 5 of 5 cycles Administration: 840 mg (07/14/2019), 420 mg (08/04/2019), 420 mg (10/05/2019), 420 mg (08/25/2019), 420 mg (09/14/2019) trastuzumab-dkst (OGIVRI) 900 mg in sodium chloride 0.9 % 250 mL chemo infusion, 903 mg, Intravenous,  Once, 5 of 5 cycles Administration: 900 mg (07/14/2019), 672 mg (08/04/2019), 672 mg (10/05/2019), 672 mg (08/25/2019), 672 mg (09/14/2019)  for chemotherapy treatment.    07/22/2019 Genetic Testing   Negative genetic testing:  No pathogenic variants detected on the [employer] Common Hereditary Cancers Panel. The report date is 07/22/2019.  The Common Hereditary Cancers Panel offered by  [facility] includes sequencing and/or deletion duplication testing of the following 48 genes: APC, ATM, AXIN2, BARD1, BMPR1A, BRCA1, BRCA2, BRIP1, CDH1, CDK4, CDKN2A (p14ARF), CDKN2A (p16INK4a), CHEK2, CTNNA1, DICER1, EPCAM  (Deletion/duplication testing only), GREM1 (promoter region deletion/duplication testing only), KIT, MEN1, MLH1, MSH2, MSH3, MSH6, MUTYH, NBN, NF1, NHTL1, PALB2, PDGFRA, PMS2, POLD1, POLE, PTEN, RAD50, RAD51C, RAD51D, RNF43, SDHB, SDHC, SDHD, SMAD4, SMARCA4. STK11, TP53, TSC1, TSC2, and VHL.  The following genes were evaluated for sequence changes only: SDHA and HOXB13 c.251G>A variant only.    11/20/2019 -  Chemotherapy      Patient is on Antibody Plan: BREAST TRASTUZUMAB + PERTUZUMAB Q21D    12/13/2019 Surgery   Right lumpectomy [name]): IDC, 5 right axillary lymph nodes negative for carcinoma.    01/22/2020 - 03/12/2020 Radiation Therapy   Site/dose:   The patient initially received a dose of 50.4 Gy in 28 fractions to the breast using whole-breast tangent fields. This was delivered using a 3-D conformal technique. The patient then received a boost to the seroma. This delivered an additional 10 Gy in 5 fractions using a 3-field photon boost technique. The total dose was 60.4 Gy.       CURRENT THERAPY: s/p radiation  INTERVAL HISTORY: SAMANTHAN DUGO 40 y.o. female returns for follow up of her breast cancer on treatment with Herceptin and Perjeta that is set to complete in 6 more weeks.  She is tolerating this well.  She notes that she is having some difficulty with right arm heaviness and breast swelling.  She also has had dysuria for a couple of days.  She has not seen gynecology recently, and would like a referral.     Patient Active Problem List   Diagnosis Date Noted  . Depression, major, single episode, moderate ([place]) 10/17/2019  . Mild persistent asthma without complication 78/93/8101  . Port-A-Cath in place 08/04/2019  . Genetic testing 07/24/2019  . Family history of cancer   . Dysphagia 06/23/2019  . Malignant neoplasm of upper-outer quadrant of right breast in female, estrogen receptor positive ([place]) 06/21/2019  . Tobacco consumption 06/06/2019  . Breast mass, right  06/06/2019  . Sensation of change in body temperature 06/06/2019  . Neuropathic pain 06/06/2019  . Bite, insect 10/19/2016  . Swelling of eyelid, left 10/19/2016  . Essential hypertension 08/10/2016  . Disturbance of skin sensation 05/08/2013  . Cervical spondylosis with myelopathy 05/08/2013  . Abnormality of gait 08/02/2012  . Chronic arthralgias of knees and hips 08/02/2012    is allergic to aspirin.  MEDICAL HISTORY: Past Medical History:  Diagnosis Date  . Abnormality of gait 08/02/2012   resolved per patient on 07/12/19  . Anemia    iron taking in the past, not currently  . Anxiety   . Arthritis    spine- cerv.  & arm   . Asthma   . Bulging lumbar disc   . Cancer [facility])    Right Breast-starts chemo on 07/14/19  . Chronic arthralgias of knees and hips 08/02/2012  . Depression    Pt. took self off Celexa one month ago, states she did n't like how it made her feel .  Marland Kitchen Family history of cancer   . GERD (gastroesophageal reflux disease)    diet controlled  . Heart murmur    pt. been told that this is a fact, no echo or cardiac surveillance in the past   . HLD (hyperlipidemia)    no meds, diet controlled  . Hypertension   . Obesity   .  Spinal stenosis     SURGICAL HISTORY: Past Surgical History:  Procedure Laterality Date  . ANTERIOR CERVICAL DECOMP/DISCECTOMY FUSION N/A 10/27/2012   Procedure: ANTERIOR CERVICAL DECOMPRESSION/DISCECTOMY FUSION 1 LEVEL;  Surgeon: Faythe Ghee, MD;  Location: [facility];  Service: Neurosurgery;  Laterality: N/A;  ANTERIOR CERVICAL DECOMPRESSION/DISCECTOMY FUSION 1 LEVEL  . BREAST LUMPECTOMY WITH RADIOACTIVE SEED AND SENTINEL LYMPH NODE BIOPSY Right 12/13/2019   Procedure: RIGHT BREAST LUMPECTOMY WITH RADIOACTIVE SEED AND RIGHT AXILLARY  SENTINEL LYMPH NODE BIOPSY, RIGHT AXILLARY NODE SEED GUIDED EXCISION, BLUE DYE INJECTION;  Surgeon: Rolm Bookbinder, MD;  Location: [facility];  Service: General;  Laterality: Right;   PEC BLOCK  . CHALAZION EXCISION     L eye   . PORTACATH PLACEMENT Left 07/13/2019   Procedure: INSERTION PORT-A-CATH WITH ULTRASOUND;  Surgeon: Rolm Bookbinder, MD;  Location: [facility];  Service: General;  Laterality: Left;  Marland Kitchen VAGINAL DELIVERY  2002  . WISDOM TOOTH EXTRACTION      SOCIAL HISTORY: Social History   Socioeconomic History  . Marital status: Married    Spouse name: Not on file  . Number of children: 1  . Years of education: associates  . Highest education level: Not on file  Occupational History    Employer: OTHER  Tobacco Use  . Smoking status: Former Smoker    Packs/day: 1.00    Years: 10.00    Pack years: 10.00    Types: Cigarettes    Quit date: 06/25/2019    Years since quitting: 0.9  . Smokeless tobacco: Never Used  Vaping Use  . Vaping Use: Never used  Substance and Sexual Activity  . Alcohol use: Yes    Comment: occas  . Drug use: Yes    Types: Marijuana    Comment: uses daily - last use 07/12/19  . Sexual activity: Yes    Birth control/protection: None  Other Topics Concern  . Not on file  Social History Narrative   Patient is married with one child.   Patient is right handed.   Patient has a Associate's degree.   Patient drinks 2-3 cups daily.   Social Determinants of Health   Financial Resource Strain: Not on file  Food Insecurity: Not on file  Transportation Needs: Not on file  Physical Activity: Not on file  Stress: Not on file  Social Connections: Not on file  Intimate Partner Violence: Not on file    FAMILY HISTORY: Family History  Problem Relation Age of Onset  . Osteoarthritis Mother   . Hypertension Mother   . Diabetes Mother   . Hypertension Father   . Gout Father   . Stroke Father 63       embolic  . Diabetes Half-Brother   . Hypertension Half-Brother   . Heart failure Half-Brother 45  . Kidney disease Half-Brother   . Diabetes Paternal Grandfather   . Cancer Paternal Grandfather 66       breast cancer    Review  of Systems  Constitutional: Positive for fatigue. Negative for appetite change, chills, fever and unexpected weight change.  HENT:   Negative for hearing loss, lump/mass and sore throat.   Eyes: Negative for eye problems and icterus.  Respiratory: Negative for chest tightness, cough and shortness of breath.   Cardiovascular: Negative for chest pain, leg swelling and palpitations.  Gastrointestinal: Negative for abdominal distention, abdominal pain, constipation, diarrhea, nausea and vomiting.  Endocrine: Negative for hot flashes.  Genitourinary: Positive for difficulty urinating and dysuria.  Musculoskeletal: Negative for arthralgias, back pain and myalgias.  Skin: Negative for itching and rash.  Neurological: Negative for dizziness, extremity weakness, headaches and numbness.  Hematological: Negative for adenopathy. Does not bruise/bleed easily.  Psychiatric/Behavioral: Negative for depression. The patient is not nervous/anxious.       PHYSICAL EXAMINATION  ECOG PERFORMANCE STATUS: 1 - Symptomatic but completely ambulatory  Vitals:   05/23/20 1309  BP: 135/75  Pulse: 78  Resp: 18  Temp: 97.9 F (36.6 C)  SpO2: 100%    Physical Exam Constitutional:      General: She is not in acute distress.    Appearance: Normal appearance. She is not toxic-appearing.  HENT:     Head: Normocephalic and atraumatic.  Eyes:     General: No scleral icterus. Cardiovascular:     Rate and Rhythm: Normal rate and regular rhythm.     Pulses: Normal pulses.     Heart sounds: Normal heart sounds.  Pulmonary:     Effort: Pulmonary effort is normal.     Breath sounds: Normal breath sounds.     Comments: Right arm with slight swelling, and right breast s/p lumpectomy and radiation, + swelling, no sign of infection, left breast benign Abdominal:     General: Abdomen is flat. Bowel sounds are normal. There is no distension.     Palpations: Abdomen is soft.     Tenderness: There is no abdominal  tenderness.  Musculoskeletal:        General: No swelling.     Cervical back: Neck supple.  Lymphadenopathy:     Cervical: No cervical adenopathy.  Skin:    General: Skin is warm and dry.     Findings: No rash.  Neurological:     General: No focal deficit present.     Mental Status: She is alert.  Psychiatric:        Mood and Affect: Mood normal.        Behavior: Behavior normal.     LABORATORY DATA:  CBC    Component Value Date/Time   WBC 5.4 05/23/2020 1244   WBC 6.2 10/25/2012 1400   RBC 3.38 (L) 05/23/2020 1244   HGB 11.4 (L) 05/23/2020 1244   HCT 32.9 (L) 05/23/2020 1244   PLT 305 05/23/2020 1244   MCV 97.3 05/23/2020 1244   MCV 93.1 07/11/2015 1908   MCH 33.7 05/23/2020 1244   MCHC 34.7 05/23/2020 1244   RDW 13.2 05/23/2020 1244   LYMPHSABS 1.4 05/23/2020 1244   MONOABS 0.4 05/23/2020 1244   EOSABS 0.2 05/23/2020 1244   BASOSABS 0.0 05/23/2020 1244    CMP     Component Value Date/Time   NA 138 05/23/2020 1244   NA 141 06/06/2019 1545   K 3.5 05/23/2020 1244   CL 108 05/23/2020 1244   CO2 22 05/23/2020 1244   GLUCOSE 134 (H) 05/23/2020 1244   BUN 11 05/23/2020 1244   BUN 7 06/06/2019 1545   CREATININE 0.83 05/23/2020 1244   CREATININE 0.79 07/11/2015 1905   CALCIUM 8.5 (L) 05/23/2020 1244   PROT 6.4 (L) 05/23/2020 1244   PROT 6.5 06/06/2019 1545   ALBUMIN 3.3 (L) 05/23/2020 1244   ALBUMIN 4.4 06/06/2019 1545   AST 13 (L) 05/23/2020 1244   ALT 9 05/23/2020 1244   ALKPHOS 80 05/23/2020 1244   BILITOT 0.4 05/23/2020 1244   GFRNONAA >60 05/23/2020 1244   GFRNONAA >89 07/11/2015 1905   GFRAA >60 01/01/2020 0914   GFRAA >89 07/11/2015 1905  ASSESSMENT and THERAPY PLAN:   Malignant neoplasm of upper-outer quadrant of right breast in female, estrogen receptor positive ([place]) 06/21/2019:Patient palpated a painful right breast lump x3 weeks. Mammogram showed a 2.0cm right breast mass at the 10 o'clock position, a 3.6cm enlarged right axillary lymph  node, and a 0.5cm left breast mass. Biopsy showed, in the right breast and axilla(lymph node), IDC, grade 3, HER-2 equivocal by IHC, + by FISH, ER+ 40%, PR - 0%, Ki67 80%,  left breast, fibrocystic changes, no malignancy.Can be followed.  Treatment plan: 1. Neoadjuvant chemotherapy with [place] Perjeta 5 cycles completed 10/05/2019 followed by HerceptinPerjeta versus Kadcylamaintenance for 1 yearstarted 07/12/2019 2. breast conserving surgery if possible with sentinel lymph node study: 12/13/2019: Pathologic complete response, 0/5 lymph nodes negative 3. Followed by adjuvant radiation therapy 01/22/2020-03/12/2020 4.  Followed by antiestrogen therapy   Genetic counseling Breast MRI on 07/07/19 showed the 2.9cm biopsy-proven malignancy in the right breast and axilla, highly suspicious non-mass enhancement in the lateral right breast spanning 14.5cm, and no left breast malignancy ----------------------------------------------------------------------------------------------------------------------------------------------------------- Current treatment: Herceptin Perjeta maintenance Adjuvant radiation completed 03/12/2020  She continues on Herceptin Perjeta and tolerates this well with no new issues.  She and I discussed anti estrogen therapy.  She was recommended ovarian suppression with goserelin with Anastrozole taken daily.  We discussed how anti estrogen therapy works, and that in her age group women had better outcomes with ovarian suppression and Anastrozole.  We discussed that if she tolerates goserelin well, we can discuss referral for her to undergo oopherectomy.  She understands.    I sent a urinalysis to evaluate her urine, which was + for acute UTI, so 5 days of cipro was prescribed.  I reviewed this with her.  She was referred to PT for right arm lymphedema evaluation and management, and to gynecology to establish care.  Her most recent echo was completed on 04/22/2020 and was normal.     I placed orders for her to undergo goserelin injection in 2 weeks.  She will receive Herceptin Perjeta in 3 weeks, then she will see me in 6 weeks for her SCP visit and final Herceptin/Perjeta.      All questions were answered. The patient knows to call the clinic with any problems, questions or concerns. We can certainly see the patient much sooner if necessary.  Total encounter time: 30 minutes*  Wilber Bihari, NP 05/24/20 10:11 AM Medical Oncology and Hematology [facility] [place], [place] [address] Tel. [phone]    Fax. [phone]  *Total Encounter Time as defined by the Centers for Medicare and Medicaid Services includes, in addition to the face-to-face time of a patient visit (documented in the note above) non-face-to-face time: obtaining and reviewing outside history, ordering and reviewing medications, tests or procedures, care coordination (communications with other health care professionals or caregivers) and documentation in the medical recor

## 2020-05-22 NOTE — Assessment & Plan Note (Addendum)
06/21/2019:Patient palpated a painful right breast lump x3 weeks. Mammogram showed a 2.0cm right breast mass at the 10 o'clock position, a 3.6cm enlarged right axillary lymph node, and a 0.5cm left breast mass. Biopsy showed, in the right breast and axilla(lymph node), IDC, grade 3, HER-2 equivocal by IHC, + by FISH, ER+ 40%, PR - 0%, Ki67 80%,  left breast, fibrocystic changes, no malignancy.Can be followed.  Treatment plan: 1. Neoadjuvant chemotherapy with [place] Perjeta 5 cycles completed 10/05/2019 followed by HerceptinPerjeta versus Kadcylamaintenance for 1 yearstarted 07/12/2019 2. breast conserving surgery if possible with sentinel lymph node study: 12/13/2019: Pathologic complete response, 0/5 lymph nodes negative 3. Followed by adjuvant radiation therapy 01/22/2020-03/12/2020 4.  Followed by antiestrogen therapy   Genetic counseling Breast MRI on 07/07/19 showed the 2.9cm biopsy-proven malignancy in the right breast and axilla, highly suspicious non-mass enhancement in the lateral right breast spanning 14.5cm, and no left breast malignancy ----------------------------------------------------------------------------------------------------------------------------------------------------------- Current treatment: Herceptin Perjeta maintenance Adjuvant radiation completed 03/12/2020  She continues on Herceptin Perjeta and tolerates this well with no new issues.  She and I discussed anti estrogen therapy.  She was recommended ovarian suppression with goserelin with Anastrozole taken daily.  We discussed how anti estrogen therapy works, and that in her age group women had better outcomes with ovarian suppression and Anastrozole.  We discussed that if she tolerates goserelin well, we can discuss referral for her to undergo oopherectomy.  She understands.    I sent a urinalysis to evaluate her urine, which was + for acute UTI, so 5 days of cipro was prescribed.  I reviewed this with her.  She was  referred to PT for right arm lymphedema evaluation and management, and to gynecology to establish care.  Her most recent echo was completed on 04/22/2020 and was normal.    I placed orders for her to undergo goserelin injection in 2 weeks.  She will receive Herceptin Perjeta in 3 weeks, then she will see me in 6 weeks for her SCP visit and final Herceptin/Perjeta.

## 2020-05-23 ENCOUNTER — Other Ambulatory Visit: Payer: Self-pay

## 2020-05-23 ENCOUNTER — Inpatient Hospital Stay: Payer: BC Managed Care – PPO

## 2020-05-23 ENCOUNTER — Inpatient Hospital Stay: Payer: BC Managed Care – PPO | Attending: Hematology and Oncology

## 2020-05-23 ENCOUNTER — Inpatient Hospital Stay: Payer: BC Managed Care – PPO | Admitting: Adult Health

## 2020-05-23 ENCOUNTER — Encounter: Payer: Self-pay | Admitting: Adult Health

## 2020-05-23 ENCOUNTER — Encounter: Payer: Self-pay | Admitting: *Deleted

## 2020-05-23 VITALS — BP 135/75 | HR 78 | Temp 97.9°F | Resp 18 | Wt 233.2 lb

## 2020-05-23 DIAGNOSIS — M1990 Unspecified osteoarthritis, unspecified site: Secondary | ICD-10-CM | POA: Diagnosis not present

## 2020-05-23 DIAGNOSIS — M25552 Pain in left hip: Secondary | ICD-10-CM | POA: Insufficient documentation

## 2020-05-23 DIAGNOSIS — R300 Dysuria: Secondary | ICD-10-CM

## 2020-05-23 DIAGNOSIS — Z170 Estrogen receptor positive status [ER+]: Secondary | ICD-10-CM

## 2020-05-23 DIAGNOSIS — C50411 Malignant neoplasm of upper-outer quadrant of right female breast: Secondary | ICD-10-CM | POA: Diagnosis not present

## 2020-05-23 DIAGNOSIS — E669 Obesity, unspecified: Secondary | ICD-10-CM | POA: Insufficient documentation

## 2020-05-23 DIAGNOSIS — M25551 Pain in right hip: Secondary | ICD-10-CM | POA: Diagnosis not present

## 2020-05-23 DIAGNOSIS — R269 Unspecified abnormalities of gait and mobility: Secondary | ICD-10-CM | POA: Diagnosis not present

## 2020-05-23 DIAGNOSIS — F329 Major depressive disorder, single episode, unspecified: Secondary | ICD-10-CM | POA: Insufficient documentation

## 2020-05-23 DIAGNOSIS — E785 Hyperlipidemia, unspecified: Secondary | ICD-10-CM | POA: Diagnosis not present

## 2020-05-23 DIAGNOSIS — Z9221 Personal history of antineoplastic chemotherapy: Secondary | ICD-10-CM | POA: Insufficient documentation

## 2020-05-23 DIAGNOSIS — Z923 Personal history of irradiation: Secondary | ICD-10-CM | POA: Diagnosis not present

## 2020-05-23 DIAGNOSIS — J4530 Mild persistent asthma, uncomplicated: Secondary | ICD-10-CM | POA: Insufficient documentation

## 2020-05-23 DIAGNOSIS — Z87891 Personal history of nicotine dependence: Secondary | ICD-10-CM | POA: Diagnosis not present

## 2020-05-23 DIAGNOSIS — E2839 Other primary ovarian failure: Secondary | ICD-10-CM | POA: Diagnosis not present

## 2020-05-23 DIAGNOSIS — Z5112 Encounter for antineoplastic immunotherapy: Secondary | ICD-10-CM | POA: Insufficient documentation

## 2020-05-23 DIAGNOSIS — I10 Essential (primary) hypertension: Secondary | ICD-10-CM | POA: Diagnosis not present

## 2020-05-23 DIAGNOSIS — K219 Gastro-esophageal reflux disease without esophagitis: Secondary | ICD-10-CM | POA: Insufficient documentation

## 2020-05-23 DIAGNOSIS — Z95828 Presence of other vascular implants and grafts: Secondary | ICD-10-CM

## 2020-05-23 LAB — CMP (CANCER CENTER ONLY)
ALT: 9 U/L (ref 0–44)
AST: 13 U/L — ABNORMAL LOW (ref 15–41)
Albumin: 3.3 g/dL — ABNORMAL LOW (ref 3.5–5.0)
Alkaline Phosphatase: 80 U/L (ref 38–126)
Anion gap: 8 (ref 5–15)
BUN: 11 mg/dL (ref 6–20)
CO2: 22 mmol/L (ref 22–32)
Calcium: 8.5 mg/dL — ABNORMAL LOW (ref 8.9–10.3)
Chloride: 108 mmol/L (ref 98–111)
Creatinine: 0.83 mg/dL (ref 0.44–1.00)
GFR, Estimated: >60 mL/min (ref >60)
Glucose, Bld: 134 mg/dL — ABNORMAL HIGH (ref 70–99)
Potassium: 3.5 mmol/L (ref 3.5–5.1)
Sodium: 138 mmol/L (ref 135–145)
Total Bilirubin: 0.4 mg/dL (ref 0.3–1.2)
Total Protein: 6.4 g/dL — ABNORMAL LOW (ref 6.5–8.1)

## 2020-05-23 LAB — URINALYSIS, COMPLETE (UACMP) WITH MICROSCOPIC
Bilirubin Urine: NEGATIVE
Glucose, UA: NEGATIVE mg/dL
Ketones, ur: 5 mg/dL — AB
Nitrite: NEGATIVE
Protein, ur: NEGATIVE mg/dL
Specific Gravity, Urine: 1.027 (ref 1.005–1.030)
pH: 5 (ref 5.0–8.0)

## 2020-05-23 LAB — CBC WITH DIFFERENTIAL (CANCER CENTER ONLY)
Abs Immature Granulocytes: 0.01 10*3/uL (ref 0.00–0.07)
Basophils Absolute: 0 10*3/uL (ref 0.0–0.1)
Basophils Relative: 1 %
Eosinophils Absolute: 0.2 10*3/uL (ref 0.0–0.5)
Eosinophils Relative: 3 %
HCT: 32.9 % — ABNORMAL LOW (ref 36.0–46.0)
Hemoglobin: 11.4 g/dL — ABNORMAL LOW (ref 12.0–15.0)
Immature Granulocytes: 0 %
Lymphocytes Relative: 27 %
Lymphs Abs: 1.4 10*3/uL (ref 0.7–4.0)
MCH: 33.7 pg (ref 26.0–34.0)
MCHC: 34.7 g/dL (ref 30.0–36.0)
MCV: 97.3 fL (ref 80.0–100.0)
Monocytes Absolute: 0.4 10*3/uL (ref 0.1–1.0)
Monocytes Relative: 7 %
Neutro Abs: 3.4 10*3/uL (ref 1.7–7.7)
Neutrophils Relative %: 62 %
Platelet Count: 305 10*3/uL (ref 150–400)
RBC: 3.38 MIL/uL — ABNORMAL LOW (ref 3.87–5.11)
RDW: 13.2 % (ref 11.5–15.5)
WBC Count: 5.4 10*3/uL (ref 4.0–10.5)
nRBC: 0 % (ref 0.0–0.2)

## 2020-05-23 LAB — PREGNANCY, URINE: Preg Test, Ur: NEGATIVE

## 2020-05-23 MED ORDER — DIPHENHYDRAMINE HCL 25 MG PO CAPS
50.0000 mg | ORAL_CAPSULE | Freq: Once | ORAL | Status: AC
  Administered 2020-05-23: 50 mg via ORAL

## 2020-05-23 MED ORDER — SODIUM CHLORIDE 0.9% FLUSH
10.0000 mL | Freq: Once | INTRAVENOUS | Status: AC
Start: 2020-05-23 — End: 2020-05-23
  Administered 2020-05-23: 10 mL
  Filled 2020-05-23: qty 10

## 2020-05-23 MED ORDER — HEPARIN SOD (PORK) LOCK FLUSH 100 UNIT/ML IV SOLN
500.0000 [IU] | Freq: Once | INTRAVENOUS | Status: AC | PRN
  Administered 2020-05-23: 500 [IU]
  Filled 2020-05-23: qty 5

## 2020-05-23 MED ORDER — SODIUM CHLORIDE 0.9 % IV SOLN
420.0000 mg | Freq: Once | INTRAVENOUS | Status: AC
  Administered 2020-05-23: 420 mg via INTRAVENOUS
  Filled 2020-05-23: qty 14

## 2020-05-23 MED ORDER — ACETAMINOPHEN 325 MG PO TABS
650.0000 mg | ORAL_TABLET | Freq: Once | ORAL | Status: AC
  Administered 2020-05-23: 650 mg via ORAL

## 2020-05-23 MED ORDER — SODIUM CHLORIDE 0.9% FLUSH
10.0000 mL | INTRAVENOUS | Status: DC | PRN
  Administered 2020-05-23: 10 mL
  Filled 2020-05-23: qty 10

## 2020-05-23 MED ORDER — CIPROFLOXACIN HCL 250 MG PO TABS: 250.0000 mg | ORAL_TABLET | Freq: Two times a day (BID) | ORAL | 0 refills | Status: DC

## 2020-05-23 MED ORDER — TRASTUZUMAB-DKST CHEMO 150 MG IV SOLR
6.0000 mg/kg | Freq: Once | INTRAVENOUS | Status: AC
  Administered 2020-05-23: 630 mg via INTRAVENOUS
  Filled 2020-05-23: qty 30

## 2020-05-23 MED ORDER — ANASTROZOLE 1 MG PO TABS
1.0000 mg | ORAL_TABLET | Freq: Every day | ORAL | 3 refills | Status: DC
Start: 2020-05-23 — End: 2020-10-03

## 2020-05-23 MED ORDER — DIPHENHYDRAMINE HCL 25 MG PO CAPS
ORAL_CAPSULE | ORAL | Status: AC
  Filled 2020-05-23: qty 2

## 2020-05-23 MED ORDER — SODIUM CHLORIDE 0.9 % IV SOLN
Freq: Once | INTRAVENOUS | Status: AC
  Filled 2020-05-23: qty 250

## 2020-05-23 MED ORDER — ACETAMINOPHEN 325 MG PO TABS
ORAL_TABLET | ORAL | Status: AC
  Filled 2020-05-23: qty 2

## 2020-05-23 NOTE — Patient Instructions (Signed)
[facility] Discharge Instructions for Patients Receiving Chemotherapy  Today you received the following chemotherapy agents: trastuzumab (Herceptin)/pertuzumab (Perjeta)  To help prevent nausea and vomiting after your treatment, we encourage you to take your nausea medication as directed.   If you develop nausea and vomiting that is not controlled by your nausea medication, call the clinic.   BELOW ARE SYMPTOMS THAT SHOULD BE REPORTED IMMEDIATELY:  *FEVER GREATER THAN 100.5 F  *CHILLS WITH OR WITHOUT FEVER  NAUSEA AND VOMITING THAT IS NOT CONTROLLED WITH YOUR NAUSEA MEDICATION  *UNUSUAL SHORTNESS OF BREATH  *UNUSUAL BRUISING OR BLEEDING  TENDERNESS IN MOUTH AND THROAT WITH OR WITHOUT PRESENCE OF ULCERS  *URINARY PROBLEMS  *BOWEL PROBLEMS  UNUSUAL RASH Items with * indicate a potential emergency and should be followed up as soon as possible.  Feel free to call the clinic should you have any questions or concerns. The clinic phone number is [phone].  Please show the [facility] at check-in to the Emergency Department and triage nurse.

## 2020-05-23 NOTE — Patient Instructions (Signed)

## 2020-05-23 NOTE — Progress Notes (Signed)
Patient declined to remain for 30 minute post Perjeta observation period.

## 2020-05-24 ENCOUNTER — Encounter: Payer: Self-pay | Admitting: Adult Health

## 2020-05-24 ENCOUNTER — Telehealth: Payer: Self-pay | Admitting: Adult Health

## 2020-05-24 LAB — URINE CULTURE: Culture: <10000 — AB

## 2020-05-24 NOTE — Telephone Encounter (Signed)
Scheduled appts per 2/24 los. Pt confirmed appt dates/times.

## 2020-05-29 ENCOUNTER — Other Ambulatory Visit: Payer: Self-pay

## 2020-05-29 ENCOUNTER — Ambulatory Visit: Payer: BC Managed Care – PPO | Attending: Adult Health | Admitting: Physical Therapy

## 2020-05-29 DIAGNOSIS — Z483 Aftercare following surgery for neoplasm: Secondary | ICD-10-CM | POA: Diagnosis present

## 2020-05-29 DIAGNOSIS — I890 Lymphedema, not elsewhere classified: Secondary | ICD-10-CM | POA: Diagnosis present

## 2020-05-29 DIAGNOSIS — M6281 Muscle weakness (generalized): Secondary | ICD-10-CM | POA: Insufficient documentation

## 2020-05-29 DIAGNOSIS — M25511 Pain in right shoulder: Secondary | ICD-10-CM | POA: Diagnosis present

## 2020-05-29 DIAGNOSIS — C50411 Malignant neoplasm of upper-outer quadrant of right female breast: Secondary | ICD-10-CM | POA: Insufficient documentation

## 2020-05-29 DIAGNOSIS — M25611 Stiffness of right shoulder, not elsewhere classified: Secondary | ICD-10-CM | POA: Diagnosis present

## 2020-05-29 DIAGNOSIS — R293 Abnormal posture: Secondary | ICD-10-CM | POA: Insufficient documentation

## 2020-05-29 DIAGNOSIS — L599 Disorder of the skin and subcutaneous tissue related to radiation, unspecified: Secondary | ICD-10-CM | POA: Diagnosis present

## 2020-05-29 DIAGNOSIS — Z170 Estrogen receptor positive status [ER+]: Secondary | ICD-10-CM | POA: Diagnosis present

## 2020-05-29 NOTE — Patient Instructions (Signed)
SHOULDER: Flexion - Supine (Cane)        Cancer Rehab [id number] ° ° ° °Hold cane in both hands. Raise arms up overhead. Do not allow back to arch. Hold _5__ seconds. Do __5-10__ times; __1-2__ times a day. ° ° °SELF ASSISTED WITH OBJECT: Shoulder Abduction / Adduction - Supine ° ° ° °Hold cane with both hands. Move both arms from side to side, keep elbows straight.  Hold when stretch felt for __5__ seconds. Repeat __5-10__ times; __1-2__ times a day. Once this becomes easier progress to third picture bringing affected arm towards ear by staying out to side. Same hold for _5_seconds. Repeat  _5-10_ times, _1-2_ times/day. ° °Shoulder Blade Stretch ° ° ° °Clasp fingers behind head with elbows touching in front of face. Pull elbows back while pressing shoulder blades together. Relax and hold as tolerated, can place pillow under elbow here for comfort as needed and to allow for prolonged stretch.  °Repeat __5__ times. Do __1-2__ sessions per day. ° ° ° ° °SHOULDER: External Rotation - Supine (Cane) ° ° ° °Hold cane with both hands. Rotate arm away from body. Keep elbow on floor and next to body. _5-10__ reps per set, hold 5 seconds, _1-2__ sets per day. °Add towel to keep elbow at side. ° °Copyright © [employer]. All rights reserved.  ° ° ° °Flexion (Eccentric) - Active-Assist (Cane)          Cancer Rehab [phone] ° ° ° °Use unaffected arm to push affected arm forward. Avoid hiking shoulder (shoulder should NOT touch cheek). Keep palm relaxed. Slowly lower affected arm. °Hold stretch for _5_ seconds repeating _5-10_ times, _1-2_ times a day. ° °Abduction (Eccentric) - Active-Assist (Cane) ° ° ° °Use unaffected arm to push affected arm out to side. Avoid hiking shoulder (shoulder should NOT touch cheek). Keep palm relaxed. Slowly lower affected arm. °Hold stretch _5_ seconds repeating _5-10_ times, _1-2_ times a day. ° °Cane Exercise: Extension ° ° °Stand holding cane behind back with both hands palm-up. Lift the cane away from  body until gentle stretch felt. Do NOT lean forward.  Hold __5__ seconds. °Repeat _5-10___ times. Do _1-2___ sessions per day. ° °CHEST: Doorway, Bilateral - Standing ° ° ° °Standing in doorway, place hands on wall with elbows bent at shoulder height and place one foot in front of other. Shift weight onto front foot. Hold _10-20__ seconds. °Do _3-5_ times, _1-2_ times a day. ° ° °  °

## 2020-05-29 NOTE — Therapy (Signed)
[place], [place], [address] Phone: [phone]   Fax:  [phone]  Physical Therapy Re- Evaluation  Patient Details  Name: Heidi Costa MRN: [id number] Date of Birth: [birth year] Referring Provider (PT): Thedore Mins   Encounter Date: 05/29/2020   PT End of Session - 05/29/20 1605    Visit Number 9    Number of Visits 25    Date for PT Re-Evaluation 07/01/20    PT Start Time 1500    PT Stop Time 1545    PT Time Calculation (min) 45 min    Activity Tolerance Patient tolerated treatment well    Behavior During Therapy [facility] for tasks assessed/performed           Past Medical History:  Diagnosis Date  . Abnormality of gait 08/02/2012   resolved per patient on 07/12/19  . Anemia    iron taking in the past, not currently  . Anxiety   . Arthritis    spine- cerv.  & arm   . Asthma   . Bulging lumbar disc   . Cancer [facility])    Right Breast-starts chemo on 07/14/19  . Chronic arthralgias of knees and hips 08/02/2012  . Depression    Pt. took self off Celexa one month ago, states she did n't like how it made her feel .  Marland Kitchen Family history of cancer   . GERD (gastroesophageal reflux disease)    diet controlled  . Heart murmur    pt. been told that this is a fact, no echo or cardiac surveillance in the past   . HLD (hyperlipidemia)    no meds, diet controlled  . Hypertension   . Obesity   . Spinal stenosis     Past Surgical History:  Procedure Laterality Date  . ANTERIOR CERVICAL DECOMP/DISCECTOMY FUSION N/A 10/27/2012   Procedure: ANTERIOR CERVICAL DECOMPRESSION/DISCECTOMY FUSION 1 LEVEL;  Surgeon: Faythe Ghee, MD;  Location: [place] [facility];  Service: Neurosurgery;  Laterality: N/A;  ANTERIOR CERVICAL DECOMPRESSION/DISCECTOMY FUSION 1 LEVEL  . BREAST LUMPECTOMY WITH RADIOACTIVE SEED AND SENTINEL LYMPH NODE BIOPSY Right 12/13/2019   Procedure: RIGHT BREAST LUMPECTOMY WITH RADIOACTIVE SEED AND  RIGHT AXILLARY  SENTINEL LYMPH NODE BIOPSY, RIGHT AXILLARY NODE SEED GUIDED EXCISION, BLUE DYE INJECTION;  Surgeon: Rolm Bookbinder, MD;  Location: [place];  Service: General;  Laterality: Right;  PEC BLOCK  . CHALAZION EXCISION     L eye   . PORTACATH PLACEMENT Left 07/13/2019   Procedure: INSERTION PORT-A-CATH WITH ULTRASOUND;  Surgeon: Rolm Bookbinder, MD;  Location: [place];  Service: General;  Laterality: Left;  Marland Kitchen VAGINAL DELIVERY  2002  . WISDOM TOOTH EXTRACTION      There were no vitals filed for this visit.    Subjective Assessment - 05/29/20 1504    Subjective Pt  returns to PT after completing radiation. She states she is having some swelling in her  breast and her arm is still sore  She had an episode of redness in her arm, but that has gone away She did not want to wait unitl her SOZO screen to get it checked out    Pertinent History Patient was diagnosed on 06/08/2019 with right grade III invasive ductal carcinoma breast cancer. It measures 2.3 cm and is located in the upper outer quadrant. She has an axillary node that measures 3.7 cm and was biopsied and is positive. Her cancer is ER positive, PR negative and HER2 positive with  a Ki67 of 80%. There is also a 5 mm area on her left breast which will be followed on MRI. 12/13/19- R lumpectomy and SLNB (1 nodes- negative)  She completed !05/14/2019    Patient Stated Goals to find out what to do about the swelling in her breast and arm    Currently in Pain? Yes   when she stretches her arm   Pain Score 7     Pain Location Chest    Pain Descriptors / Indicators Tightness    Pain Type Acute pain    Pain Radiating Towards into axilla    Pain Onset More than a month ago    Pain Frequency Intermittent    Aggravating Factors  stretching her arm overhead    Pain Relieving Factors don't stretch her arm    Effect of Pain on Daily Activities pt still does daily acitvities even though it hurts              [facility] PT  Assessment - 05/29/20 0001      Assessment   Medical Diagnosis Right breast cancer    Referring Provider (PT) Thedore Mins    Onset Date/Surgical Date 06/08/19    Hand Dominance Right    Prior Therapy none      Precautions   Precautions Other (comment)    Precaution Comments active cancer      Restrictions   Weight Bearing Restrictions No      Balance Screen   Has the patient fallen in the past 6 months No    Has the patient had a decrease in activity level because of a fear of falling?  No    Is the patient reluctant to leave their home because of a fear of falling?  No      Home Social worker Private residence    Living Arrangements Spouse/significant other;Children   Husband and 28 y.o. son   Available Help at Discharge Family      Prior Function   Level of Independence Independent    Vocation Full time employment    Doctor, [employer]    Leisure She stopped doing the exercise for her shoulders and she is not walking      Cognition   Overall Cognitive Status Within Functional Limits for tasks assessed      Observation/Other Assessments   Observations pt with large pendulous brest with right breast about 25 % larger than left with deep pitting indentions in lymphedema in breast. She has pain in breast. She says she had moist desqamation after radiation  but is all healed now.  she does not have a compression bra      Functional Tests   Functional tests Sit to Stand      Sit to Stand   Comments 19 reps in 30 seconds with "mild" effort , no use of hands, pt stating her legs were just starting to feel it      Posture/Postural Control   Posture/Postural Control Postural limitations    Postural Limitations Rounded Shoulders;Forward head      AROM   Overall AROM Comments Cervical AROM is all WNL    Right Shoulder Flexion 165 Degrees   tight in axilla   Right Shoulder ABduction 165 Degrees   tightness in axilla    Right Shoulder External Rotation 85 Degrees      Strength   Overall Strength Within functional limits for tasks performed  Palpation   Palpation comment tight and tender at pec major area to palpation             LYMPHEDEMA/ONCOLOGY QUESTIONNAIRE - 05/29/20 0001      Right Upper Extremity Lymphedema   10 cm Proximal to Olecranon Process 40.5 cm    Olecranon Process 28 cm    10 cm Proximal to Ulnar Styloid Process 25 cm    Just Proximal to Ulnar Styloid Process 18 cm    Across Hand at [employer] 21.3 cm    At [place] of 2nd Digit 6.1 cm      Left Upper Extremity Lymphedema   10 cm Proximal to Olecranon Process 42 cm    Olecranon Process 28 cm    10 cm Proximal to Ulnar Styloid Process 24 cm    Just Proximal to Ulnar Styloid Process 18 cm    Across Hand at [employer] 21 cm    At [place] of 2nd Digit 6 cm           L-DEX FLOWSHEETS - 05/29/20 1500      L-DEX LYMPHEDEMA SCREENING   Measurement Type Unilateral    L-DEX MEASUREMENT EXTREMITY Upper Extremity    POSITION  Standing    DOMINANT SIDE Right    At Risk Side Right    BASELINE SCORE (UNILATERAL) -1    L-DEX SCORE (UNILATERAL) 2.4                Flowsheet Row Outpatient [facility]  Lymphedema Life Impact Scale Total Score 7.35 %      Objective measurements completed on examination: See above findings.       [place] Adult PT Treatment/Exercise - 05/29/20 0001      Exercises   Exercises Shoulder   reinforced supine and standing dowel exercises that pt had been taught previously                 PT Education - 05/29/20 1602    Education Details reviewed shoulder dowel exercises in supine and standing    Person(s) Educated Patient    Methods Explanation;Handout    Comprehension Verbalized understanding;Returned demonstration               PT Long Term Goals - 05/29/20 1532      PT LONG TERM GOAL #1   Title  Patient will demonstrate she has regained full shoulder ROM of left shoulder without pain    Time 4    Period Weeks    Status New      PT LONG TERM GOAL #2   Title Pt will know how to manage lymphedema in right breast with self manual lymph drainage, use of compression and exercise    Time 4    Period Weeks    Status New      PT LONG TERM GOAL #3   Title Pt will be independent in a home exercise program for shoulder ROM, strength and general fitness    Time 4    Period Weeks    Status New                  Plan - 05/29/20 1606    Clinical Impression Statement Pt comes back to PT after having completed radiation wtih increased swelling in her breast. She has large pendulous breasts wth visible lymphedema in her right breast.  she  does not have a compression bra.  She has not  been doing her exercises either and is having pain and tightness in her right chest near pec major and axilla She would be interested in doing an exercise program to regain strength after chemo, but she did well on her 30 sec sit to stand test Her LDex is WNL and she does not appear to have lymphedema in her arm    Examination-Activity Limitations Lift;Reach Overhead;Carry    Stability/Clinical Decision Making Stable/Uncomplicated    Clinical Decision Making Low    Rehab Potential Excellent    PT Frequency 2x / week   possibly 3 times a week if breast is bandaged for compression   PT Duration 4 weeks    PT Treatment/Interventions ADLs/Self Care Home Management;Therapeutic exercise;Patient/family education;Manual techniques;Manual lymph drainage;Compression bandaging;Scar mobilization;Passive range of motion;Vasopneumatic Device    PT Next Visit Plan MLD to right breast, (consider compression bandaging with coban)  assist with getting a compression bra, teach HEP program    Consulted and Agree with Plan of Care Patient           Patient will benefit from skilled therapeutic intervention in order to  improve the following deficits and impairments:  Postural dysfunction,Decreased range of motion,Decreased knowledge of precautions,Impaired UE functional use,Pain,Increased edema,Increased fascial restricitons  Visit Diagnosis: Lymphedema, not elsewhere classified - Plan: PT plan of care cert/re-cert  Acute pain of right shoulder - Plan: PT plan of care cert/re-cert  Stiffness of right shoulder, not elsewhere classified - Plan: PT plan of care cert/re-cert  Abnormal posture - Plan: PT plan of care cert/re-cert  Muscle weakness (generalized) - Plan: PT plan of care cert/re-cert     Problem List Patient Active Problem List   Diagnosis Date Noted  . Depression, major, single episode, moderate ([place]) 10/17/2019  . Mild persistent asthma without complication 16/12/9602  . Port-A-Cath in place 08/04/2019  . Genetic testing 07/24/2019  . Family history of cancer   . Dysphagia 06/23/2019  . Malignant neoplasm of upper-outer quadrant of right breast in female, estrogen receptor positive ([place]) 06/21/2019  . Tobacco consumption 06/06/2019  . Breast mass, right 06/06/2019  . Sensation of change in body temperature 06/06/2019  . Neuropathic pain 06/06/2019  . Bite, insect 10/19/2016  . Swelling of eyelid, left 10/19/2016  . Essential hypertension 08/10/2016  . Disturbance of skin sensation 05/08/2013  . Cervical spondylosis with myelopathy 05/08/2013  . Abnormality of gait 08/02/2012  . Chronic arthralgias of knees and hips 08/02/2012   Donato Heinz. Owens Shark PT  [place] 05/29/2020, [place], [place], [address] Phone: [phone]   Fax:  [phone]  Name: CHARLOT GOUIN MRN: [id number] Date of Birth: 12/28/1980

## 2020-05-30 ENCOUNTER — Encounter: Payer: Self-pay | Admitting: *Deleted

## 2020-06-04 ENCOUNTER — Other Ambulatory Visit: Payer: Self-pay

## 2020-06-04 ENCOUNTER — Encounter: Payer: Self-pay | Admitting: Rehabilitation

## 2020-06-04 ENCOUNTER — Ambulatory Visit: Payer: BC Managed Care – PPO | Admitting: Rehabilitation

## 2020-06-04 DIAGNOSIS — Z483 Aftercare following surgery for neoplasm: Secondary | ICD-10-CM

## 2020-06-04 DIAGNOSIS — I890 Lymphedema, not elsewhere classified: Secondary | ICD-10-CM | POA: Diagnosis not present

## 2020-06-04 DIAGNOSIS — R293 Abnormal posture: Secondary | ICD-10-CM

## 2020-06-04 NOTE — Therapy (Signed)
[place], [place], [address] Phone: [phone]   Fax:  [phone]  Physical Therapy Treatment  Patient Details  Name: Heidi Costa MRN: [id number] Date of Birth: 1980/06/17 Referring Provider (PT): Thedore Mins   Encounter Date: 06/04/2020   PT End of Session - 06/04/20 1714    Visit Number 10    Number of Visits 25    Date for PT Re-Evaluation 07/01/20    PT Start Time 1604    PT Stop Time 1655    PT Time Calculation (min) 51 min    Activity Tolerance Patient tolerated treatment well    Behavior During Therapy [facility] for tasks assessed/performed           Past Medical History:  Diagnosis Date  . Abnormality of gait 08/02/2012   resolved per patient on 07/12/19  . Anemia    iron taking in the past, not currently  . Anxiety   . Arthritis    spine- cerv.  & arm   . Asthma   . Bulging lumbar disc   . Cancer [facility])    Right Breast-starts chemo on 07/14/19  . Chronic arthralgias of knees and hips 08/02/2012  . Depression    Pt. took self off Celexa one month ago, states she did n't like how it made her feel .  Marland Kitchen Family history of cancer   . GERD (gastroesophageal reflux disease)    diet controlled  . Heart murmur    pt. been told that this is a fact, no echo or cardiac surveillance in the past   . HLD (hyperlipidemia)    no meds, diet controlled  . Hypertension   . Obesity   . Spinal stenosis     Past Surgical History:  Procedure Laterality Date  . ANTERIOR CERVICAL DECOMP/DISCECTOMY FUSION N/A 10/27/2012   Procedure: ANTERIOR CERVICAL DECOMPRESSION/DISCECTOMY FUSION 1 LEVEL;  Surgeon: Faythe Ghee, MD;  Location: [place] [facility];  Service: Neurosurgery;  Laterality: N/A;  ANTERIOR CERVICAL DECOMPRESSION/DISCECTOMY FUSION 1 LEVEL  . BREAST LUMPECTOMY WITH RADIOACTIVE SEED AND SENTINEL LYMPH NODE BIOPSY Right 12/13/2019   Procedure: RIGHT BREAST LUMPECTOMY WITH RADIOACTIVE SEED AND  RIGHT AXILLARY  SENTINEL LYMPH NODE BIOPSY, RIGHT AXILLARY NODE SEED GUIDED EXCISION, BLUE DYE INJECTION;  Surgeon: Rolm Bookbinder, MD;  Location: [place];  Service: General;  Laterality: Right;  PEC BLOCK  . CHALAZION EXCISION     L eye   . PORTACATH PLACEMENT Left 07/13/2019   Procedure: INSERTION PORT-A-CATH WITH ULTRASOUND;  Surgeon: Rolm Bookbinder, MD;  Location: [place];  Service: General;  Laterality: Left;  Marland Kitchen VAGINAL DELIVERY  2002  . WISDOM TOOTH EXTRACTION      There were no vitals filed for this visit.   Subjective Assessment - 06/04/20 1603    Subjective Nothing new    Pertinent History Patient was diagnosed on 06/08/2019 with right grade III invasive ductal carcinoma breast cancer. It measures 2.3 cm and is located in the upper outer quadrant. She has an axillary node that measures 3.7 cm and was biopsied and is positive. Her cancer is ER positive, PR negative and HER2 positive with a Ki67 of 80%. There is also a 5 mm area on her left breast which will be followed on MRI. 12/13/19- R lumpectomy and SLNB (1 nodes- negative)  She completed !05/14/2019    Currently in Pain? No/denies   its better today  Flowsheet Row Outpatient Rehab from 12/28/2019 in [place]  Lymphedema Life Impact Scale Total Score 7.35 %            [facility] Adult PT Treatment/Exercise - 06/04/20 0001      Manual Therapy   Manual Therapy Manual Lymphatic Drainage (MLD);Passive ROM;Edema management    Manual therapy comments attempted to bandage but clinic out of 12cm short stretch.  Pt was unable to fit a tg size K so planned on wrapping over bra for skin coverage.  May also try coban or swell spot in bra if she gets it soon enough    Edema Management gave pt script for compression bras    Myofascial Release to the right axilla in arm overhead positon held by PT    Manual Lymphatic Drainage (MLD) MLD short neck,deep and  superficial abdominals, bil axillary nodes, right inguinal nodes, anterior interaxillary anastomosis, right axillo- inguinal, then Right breast working towards pathways and then retracing all steps, also performed in SL to lateral trunk/breast    Passive ROM to the right shoulder into flexion, abduction, ER                       PT Long Term Goals - 05/29/20 1532      PT LONG TERM GOAL #1   Title Patient will demonstrate she has regained full shoulder ROM of left shoulder without pain    Time 4    Period Weeks    Status New      PT LONG TERM GOAL #2   Title Pt will know how to manage lymphedema in right breast with self manual lymph drainage, use of compression and exercise    Time 4    Period Weeks    Status New      PT LONG TERM GOAL #3   Title Pt will be independent in a home exercise program for shoulder ROM, strength and general fitness    Time 4    Period Weeks    Status New                 Plan - 06/04/20 1714    Clinical Impression Statement Started MLD to the right breast which is overall soft edema but with fibrosis medial breast and especially firm under the axilla and pectoralis near the axilla where desquamation from radiation has healed.  Pt noted feeling better post MT which was encouraging.  Pt hopes to get bra ASAP.    PT Frequency 2x / week    PT Duration 4 weeks    PT Treatment/Interventions ADLs/Self Care Home Management;Therapeutic exercise;Patient/family education;Manual techniques;Manual lymph drainage;Compression bandaging;Scar mobilization;Passive range of motion;Vasopneumatic Device    PT Next Visit Plan MLD to right breast, (consider compression bandaging with coban)  or Short stretch, assist with getting a compression bra, teach HEP program    Consulted and Agree with Plan of Care Patient           Patient will benefit from skilled therapeutic intervention in order to improve the following deficits and impairments:     Visit  Diagnosis: Lymphedema, not elsewhere classified  Abnormal posture  Aftercare following surgery for neoplasm     Problem List Patient Active Problem List   Diagnosis Date Noted  . Depression, major, single episode, moderate ([place]) 10/17/2019  . Mild persistent asthma without complication 54/62/7035  . Port-A-Cath in place 08/04/2019  . Genetic testing 07/24/2019  . Family history of cancer   .  Dysphagia 06/23/2019  . Malignant neoplasm of upper-outer quadrant of right breast in female, estrogen receptor positive ([place]) 06/21/2019  . Tobacco consumption 06/06/2019  . Breast mass, right 06/06/2019  . Sensation of change in body temperature 06/06/2019  . Neuropathic pain 06/06/2019  . Bite, insect 10/19/2016  . Swelling of eyelid, left 10/19/2016  . Essential hypertension 08/10/2016  . Disturbance of skin sensation 05/08/2013  . Cervical spondylosis with myelopathy 05/08/2013  . Abnormality of gait 08/02/2012  . Chronic arthralgias of knees and hips 08/02/2012    Stark Bray 06/04/2020, 5:17 PM  [place], [place], [address] Phone: [phone]   Fax:  [phone]  Name: Heidi Costa MRN: [id number] Date of Birth: 1980-10-15

## 2020-06-06 ENCOUNTER — Inpatient Hospital Stay: Payer: BC Managed Care – PPO | Attending: Hematology and Oncology

## 2020-06-06 ENCOUNTER — Ambulatory Visit: Payer: BC Managed Care – PPO

## 2020-06-06 ENCOUNTER — Other Ambulatory Visit: Payer: Self-pay

## 2020-06-06 VITALS — BP 113/75 | HR 65 | Temp 99.3°F | Resp 18

## 2020-06-06 DIAGNOSIS — Z483 Aftercare following surgery for neoplasm: Secondary | ICD-10-CM

## 2020-06-06 DIAGNOSIS — Z95828 Presence of other vascular implants and grafts: Secondary | ICD-10-CM

## 2020-06-06 DIAGNOSIS — Z170 Estrogen receptor positive status [ER+]: Secondary | ICD-10-CM | POA: Insufficient documentation

## 2020-06-06 DIAGNOSIS — R293 Abnormal posture: Secondary | ICD-10-CM

## 2020-06-06 DIAGNOSIS — C50411 Malignant neoplasm of upper-outer quadrant of right female breast: Secondary | ICD-10-CM | POA: Diagnosis present

## 2020-06-06 DIAGNOSIS — Z5112 Encounter for antineoplastic immunotherapy: Secondary | ICD-10-CM | POA: Insufficient documentation

## 2020-06-06 DIAGNOSIS — M25511 Pain in right shoulder: Secondary | ICD-10-CM

## 2020-06-06 DIAGNOSIS — I890 Lymphedema, not elsewhere classified: Secondary | ICD-10-CM

## 2020-06-06 MED ORDER — GOSERELIN ACETATE 3.6 MG ~~LOC~~ IMPL
3.6000 mg | DRUG_IMPLANT | Freq: Once | SUBCUTANEOUS | Status: AC
  Administered 2020-06-06: 3.6 mg via SUBCUTANEOUS

## 2020-06-06 MED ORDER — GOSERELIN ACETATE 3.6 MG ~~LOC~~ IMPL
DRUG_IMPLANT | SUBCUTANEOUS | Status: AC
  Filled 2020-06-06: qty 3.6

## 2020-06-06 NOTE — Patient Instructions (Signed)
Manual Lymph Drainage for Right Breast.  Do daily.  Do slowly. Use flat hands with just enough pressure to stretch the skin. Do not slide over the skin, but move the skin with the hand you're using. Lie down or sit comfortably (in a recliner, for example) to do this.  1) Hug yourself:  cross arms and do circles at collar bones near neck 5-7 times (to "wake up" lots of lymph nodes in this area). 2) Take slow deep breaths, allowing your belly to balloon out as your breathe in, 5x (to "wake up" abdominal lymph nodes to take on extra fluid). 3) Left armpit--stretch skin in small circles to stimulate intact lymph nodes there, 5-7x. 4) Right groin area, at panty line--stretch skin in small circles to stimulate lymph nodes 5-7x. 5) Redirect fluid from right chest toward left armpit (stretch skin starting at right chest in 3-4 spots working toward left armpit) 3-4x across the chest. 6) Redirect fluid from right armpit toward right groin (cup your hand around the curve of your right side and do 3-4 "pumps" from armpit to groin) 3-4x down your side. 7) Draw an imaginary diagonal line from upper outer breast through the nipple area toward lower inner breast.  Direct fluid upward and inward from this line toward the pathway across your upper chest (established in #5).  Do this in three rows to treat all of the upper inner breast tissue, and do each row 3-4x. 8) Then repeat #5 above. 9) From the imaginary diagonal, direct fluid in three rows (to treat all of lower outer breast tissue) downward and outward toward pathway established in #6 that is aimed at the right groin. 10)  Then repeat #6 above. 11)  End with repeating #3 and #4 above.   [facility] [address] [place], [place]   [address] [phone]  

## 2020-06-06 NOTE — Patient Instructions (Signed)

## 2020-06-06 NOTE — Therapy (Signed)
[place], [place], [address] Phone: [phone]   Fax:  [phone]  Physical Therapy Treatment  Patient Details  Name: Heidi Costa MRN: [id number] Date of Birth: 11-11-1980 Referring Provider (PT): Thedore Mins   Encounter Date: 06/06/2020   PT End of Session - 06/06/20 1710    Visit Number 11    Number of Visits 25    Date for PT Re-Evaluation 07/01/20    PT Start Time 1606    PT Stop Time 1700    PT Time Calculation (min) 54 min    Activity Tolerance Patient tolerated treatment well    Behavior During Therapy [facility] for tasks assessed/performed           Past Medical History:  Diagnosis Date  . Abnormality of gait 08/02/2012   resolved per patient on 07/12/19  . Anemia    iron taking in the past, not currently  . Anxiety   . Arthritis    spine- cerv.  & arm   . Asthma   . Bulging lumbar disc   . Cancer [facility])    Right Breast-starts chemo on 07/14/19  . Chronic arthralgias of knees and hips 08/02/2012  . Depression    Pt. took self off Celexa one month ago, states she did n't like how it made her feel .  Marland Kitchen Family history of cancer   . GERD (gastroesophageal reflux disease)    diet controlled  . Heart murmur    pt. been told that this is a fact, no echo or cardiac surveillance in the past   . HLD (hyperlipidemia)    no meds, diet controlled  . Hypertension   . Obesity   . Spinal stenosis     Past Surgical History:  Procedure Laterality Date  . ANTERIOR CERVICAL DECOMP/DISCECTOMY FUSION N/A 10/27/2012   Procedure: ANTERIOR CERVICAL DECOMPRESSION/DISCECTOMY FUSION 1 LEVEL;  Surgeon: Faythe Ghee, MD;  Location: [place] [facility];  Service: Neurosurgery;  Laterality: N/A;  ANTERIOR CERVICAL DECOMPRESSION/DISCECTOMY FUSION 1 LEVEL  . BREAST LUMPECTOMY WITH RADIOACTIVE SEED AND SENTINEL LYMPH NODE BIOPSY Right 12/13/2019   Procedure: RIGHT BREAST LUMPECTOMY WITH RADIOACTIVE SEED AND  RIGHT AXILLARY  SENTINEL LYMPH NODE BIOPSY, RIGHT AXILLARY NODE SEED GUIDED EXCISION, BLUE DYE INJECTION;  Surgeon: Rolm Bookbinder, MD;  Location: [place];  Service: General;  Laterality: Right;  PEC BLOCK  . CHALAZION EXCISION     L eye   . PORTACATH PLACEMENT Left 07/13/2019   Procedure: INSERTION PORT-A-CATH WITH ULTRASOUND;  Surgeon: Rolm Bookbinder, MD;  Location: [place];  Service: General;  Laterality: Left;  Marland Kitchen VAGINAL DELIVERY  2002  . WISDOM TOOTH EXTRACTION      There were no vitals filed for this visit.   Subjective Assessment - 06/06/20 1607    Subjective My arm feels good for the most part but my breast is still swollen. [facility] can't see me until March 24th.     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 It measures 2.3 cm and is located in the upper outer quadrant. She has an axillary node that measures 3.7 cm and was biopsied and is positive. Her cancer is ER positive, PR negative and HER2 positive with a Ki67 of 80%. There is also a 5 mm area on her left breast which will be followed on MRI. 12/13/19- R lumpectomy and SLNB (1 nodes- negative)  She completed !05/14/2019  Patient Stated Goals to find out what to do about the swelling in her breast and arm    Currently in Pain? No/denies    Pain Score 7     Pain Location Chest    Pain Orientation Right    Multiple Pain Sites No                     Flowsheet Row Outpatient [facility]  Lymphedema Life Impact Scale Total Score 7.35 %            [facility] Adult PT Treatment/Exercise - 06/06/20 0001      Posture/Postural Control   Posture/Postural Control Postural limitations    Postural Limitations Rounded Shoulders;Forward head      Manual Therapy   Edema Management     Myofascial Release to the right axilla    Manual Lymphatic Drainage (MLD) MLD  short neck, bil axillary nodes, right inguinal nodes, anterior interaxillary anastomosis, right axillo- inguinal, then Right breast working towards pathways and then retracing all steps, Pt was instructed and practiced each of the techniques with the table elevated    Passive ROM to the right shoulder into flexion, abduction, ER , D2 flexion                 PT Education - 06/06/20 1709    Education Details Pt was educated in right breast MLD and practiced each of the steps with therapist.    Person(s) Educated Patient    Methods Explanation;Demonstration;Handout    Comprehension Returned demonstration;Verbal cues required;Tactile cues required;Need further instruction               PT Long Term Goals - 05/29/20 1532      PT LONG TERM GOAL #1   Title Patient will demonstrate she has regained full shoulder ROM of left shoulder without pain    Time 4    Period Weeks    Status New      PT LONG TERM GOAL #2   Title Pt will know how to manage lymphedema in right breast with self manual lymph drainage, use of compression and exercise    Time 4    Period Weeks    Status New      PT LONG TERM GOAL #3   Title Pt will be independent in a home exercise program for shoulder ROM, strength and general fitness    Time 4    Period Weeks    Status New                 Plan - 06/06/20 1710    Clinical Impression Statement Pt arrived indicating she could not get an appt at [facility] for her bra until 06/20/20.  Marcene Brawn and I measured, but later Marcene Brawn was able to get an appt for pt tomorrow at [facility] at 2:00 PM so she will do that.  Treatment consisted of MFR to right axillary/upper arm and PROM.  We spent the bulk of the time on right breast MLD with instruction to pt.  She did reasonably well but will require further instruction to be proficient.    Examination-Activity Limitations Lift;Reach Overhead;Carry    Stability/Clinical Decision Making Stable/Uncomplicated     Rehab Potential Excellent    PT Frequency 2x / week    PT Duration 4 weeks    PT Treatment/Interventions ADLs/Self Care Home Management;Therapeutic exercise;Patient/family education;Manual techniques;Manual lymph drainage;Compression bandaging;Scar mobilization;Passive range of motion;Vasopneumatic  Device    PT Next Visit Plan MLD to right breast, (consider compression bandaging with coban)  or Short stretch,getting fit for bra tomorrow, review HEP teach HEP program    Recommended Other Services Flexi touch    Consulted and Agree with Plan of Care Patient           Patient will benefit from skilled therapeutic intervention in order to improve the following deficits and impairments:  Postural dysfunction,Decreased range of motion,Decreased knowledge of precautions,Impaired UE functional use,Pain,Increased edema,Increased fascial restricitons  Visit Diagnosis: Lymphedema, not elsewhere classified  Abnormal posture  Aftercare following surgery for neoplasm  Acute pain of right shoulder     Problem List Patient Active Problem List   Diagnosis Date Noted  . Depression, major, single episode, moderate ([place]) 10/17/2019  . Mild persistent asthma without complication 42/32/0094  . Port-A-Cath in place 08/04/2019  . Genetic testing 07/24/2019  . Family history of cancer   . Dysphagia 06/23/2019  . Malignant neoplasm of upper-outer quadrant of right breast in female, estrogen receptor positive ([place]) 06/21/2019  . Tobacco consumption 06/06/2019  . Breast mass, right 06/06/2019  . Sensation of change in body temperature 06/06/2019  . Neuropathic pain 06/06/2019  . Bite, insect 10/19/2016  . Swelling of eyelid, left 10/19/2016  . Essential hypertension 08/10/2016  . Disturbance of skin sensation 05/08/2013  . Cervical spondylosis with myelopathy 05/08/2013  . Abnormality of gait 08/02/2012  . Chronic arthralgias of knees and hips 08/02/2012    Claris Pong 06/06/2020, 5:22  PM  [place], [place], [address] Phone: [phone]   Fax:  [phone]  Name: Heidi Costa MRN: [id number] Date of Birth: 11/19/1980 Cheral Almas, PT 06/06/20 5:24 PM

## 2020-06-07 ENCOUNTER — Telehealth: Payer: Self-pay | Admitting: Adult Health

## 2020-06-07 NOTE — Telephone Encounter (Signed)
Called pt to r/s appts per 3/11 sch msg. No answer. Left msg for pt to call back to r/s.

## 2020-06-11 ENCOUNTER — Ambulatory Visit: Payer: BC Managed Care – PPO | Admitting: Physical Therapy

## 2020-06-11 ENCOUNTER — Encounter: Payer: Self-pay | Admitting: Physical Therapy

## 2020-06-11 ENCOUNTER — Telehealth: Payer: Self-pay | Admitting: Adult Health

## 2020-06-11 ENCOUNTER — Other Ambulatory Visit: Payer: Self-pay

## 2020-06-11 DIAGNOSIS — M25511 Pain in right shoulder: Secondary | ICD-10-CM

## 2020-06-11 DIAGNOSIS — M6281 Muscle weakness (generalized): Secondary | ICD-10-CM

## 2020-06-11 DIAGNOSIS — M25611 Stiffness of right shoulder, not elsewhere classified: Secondary | ICD-10-CM

## 2020-06-11 DIAGNOSIS — I890 Lymphedema, not elsewhere classified: Secondary | ICD-10-CM

## 2020-06-11 DIAGNOSIS — Z483 Aftercare following surgery for neoplasm: Secondary | ICD-10-CM

## 2020-06-11 DIAGNOSIS — L599 Disorder of the skin and subcutaneous tissue related to radiation, unspecified: Secondary | ICD-10-CM

## 2020-06-11 DIAGNOSIS — R293 Abnormal posture: Secondary | ICD-10-CM

## 2020-06-11 NOTE — Telephone Encounter (Signed)
R/s appts per patient's request. Pt aware of updated appts date and times.

## 2020-06-11 NOTE — Therapy (Signed)
[place], [place], [address] Phone: [phone]   Fax:  [phone]  Physical Therapy Treatment  Patient Details  Name: Heidi Costa MRN: [id number] Date of Birth: [birth year] Referring Provider (PT): Thedore Mins   Encounter Date: 06/11/2020   PT End of Session - 06/11/20 1701    Visit Number 12    Number of Visits 25    Date for PT Re-Evaluation 07/01/20    PT Start Time 1600    PT Stop Time 1645    PT Time Calculation (min) 45 min    Activity Tolerance Patient tolerated treatment well    Behavior During Therapy [facility] for tasks assessed/performed           Past Medical History:  Diagnosis Date  . Abnormality of gait 08/02/2012   resolved per patient on 07/12/19  . Anemia    iron taking in the past, not currently  . Anxiety   . Arthritis    spine- cerv.  & arm   . Asthma   . Bulging lumbar disc   . Cancer [facility])    Right Breast-starts chemo on 07/14/19  . Chronic arthralgias of knees and hips 08/02/2012  . Depression    Pt. took self off Celexa one month ago, states she did n't like how it made her feel .  Marland Kitchen Family history of cancer   . GERD (gastroesophageal reflux disease)    diet controlled  . Heart murmur    pt. been told that this is a fact, no echo or cardiac surveillance in the past   . HLD (hyperlipidemia)    no meds, diet controlled  . Hypertension   . Obesity   . Spinal stenosis     Past Surgical History:  Procedure Laterality Date  . ANTERIOR CERVICAL DECOMP/DISCECTOMY FUSION N/A 10/27/2012   Procedure: ANTERIOR CERVICAL DECOMPRESSION/DISCECTOMY FUSION 1 LEVEL;  Surgeon: Faythe Ghee, MD;  Location: [place] [facility];  Service: Neurosurgery;  Laterality: N/A;  ANTERIOR CERVICAL DECOMPRESSION/DISCECTOMY FUSION 1 LEVEL  . BREAST LUMPECTOMY WITH RADIOACTIVE SEED AND SENTINEL LYMPH NODE BIOPSY Right 12/13/2019   Procedure: RIGHT BREAST LUMPECTOMY WITH RADIOACTIVE SEED AND  RIGHT AXILLARY  SENTINEL LYMPH NODE BIOPSY, RIGHT AXILLARY NODE SEED GUIDED EXCISION, BLUE DYE INJECTION;  Surgeon: Rolm Bookbinder, MD;  Location: [place];  Service: General;  Laterality: Right;  PEC BLOCK  . CHALAZION EXCISION     L eye   . PORTACATH PLACEMENT Left 07/13/2019   Procedure: INSERTION PORT-A-CATH WITH ULTRASOUND;  Surgeon: Rolm Bookbinder, MD;  Location: [place];  Service: General;  Laterality: Left;  Marland Kitchen VAGINAL DELIVERY  2002  . WISDOM TOOTH EXTRACTION      There were no vitals filed for this visit.   Subjective Assessment - 06/11/20 1607    Subjective Pt states she got 2 compression bras and and compression camisole She is able to wear them all day and tries to wear them a little bit in the evening.  She has only had them a few days and so cannot really tell if it is helping    Pertinent History Patient was diagnosed on 06/08/2019 with right grade III invasive ductal carcinoma breast cancer. It measures 2.3 cm and is located in the upper outer quadrant. She has an axillary node that measures 3.7 cm and was biopsied and is positive. Her cancer is ER positive, PR negative and HER2 positive with a Ki67 of 80%. There is also a 5  mm area on her left breast which will be followed on MRI. 12/13/19- R lumpectomy and SLNB (1 nodes- negative)  She completed !05/14/2019    Patient Stated Goals to find out what to do about the swelling in her breast and arm    Currently in Pain? No/denies                     Flowsheet Row Outpatient Re[facility]/2019 [facility]  Lymphedema Life Impact Scale Total Score 7.35 %            [facility] Adult PT Treatment/Exercise - 06/11/20 0001      Manual Therapy   Manual Therapy Manual Lymphatic Drainage (MLD);Myofascial release;Passive ROM    Edema Management gave pt thin chip pack to wear inside bra at medial breast    Myofascial Release to pec major with prologed pressure and  stroking pt reports she got good relief with that    Manual Lymphatic Drainage (MLD) MLD short neck,deep and superficial abdominals, bil axillary nodes, right inguinal nodes, anterior interaxillary anastomosis, right axillo- inguinal, then Right breast working towards pathways and then retracing all steps, also performed in SL to lateral trunk/breast    Passive ROM to the right shoulder into flexion, abduction, ER                       PT Long Term Goals - 05/29/20 1532      PT LONG TERM GOAL #1   Title Patient will demonstrate she has regained full shoulder ROM of left shoulder without pain    Time 4    Period Weeks    Status New      PT LONG TERM GOAL #2   Title Pt will know how to manage lymphedema in right breast with self manual lymph drainage, use of compression and exercise    Time 4    Period Weeks    Status New      PT LONG TERM GOAL #3   Title Pt will be independent in a home exercise program for shoulder ROM, strength and general fitness    Time 4    Period Weeks    Status New                 Plan - 06/11/20 1703    Clinical Impression Statement Pt is wearing Prarie Hugger and [employer] cami both at the same time to provide compression to the breast. Discussed Flexitouch and pt wants to have demographics sent.  She still has firmness in medial breast with enlarged pores and tender tightness in pec major area. Pt will benefit from Flexitouch as she is having difficutly with self MLD and pitting edema persists despite compression. Added a chip pack for more compression to medial breast    Examination-Activity Limitations Lift;Reach Overhead;Carry    Stability/Clinical Decision Making Stable/Uncomplicated    Rehab Potential Excellent    PT Frequency 2x / week    PT Duration 4 weeks    PT Treatment/Interventions ADLs/Self Care Home Management;Therapeutic exercise;Patient/family education;Manual techniques;Manual lymph drainage;Compression bandaging;Scar  mobilization;Passive range of motion;Vasopneumatic Device    PT Next Visit Plan MLD to right breast, assess use of compression bras and chip pack. soft tissue work to tight pec major and axilla.  review HEP teach HEP program    Recommended Other Services demographics sent to [facility] 06/11/2020    Consulted and Agree with Plan of Care Patient  Family Member Consulted Husband           Patient will benefit from skilled therapeutic intervention in order to improve the following deficits and impairments:  Postural dysfunction,Decreased range of motion,Decreased knowledge of precautions,Impaired UE functional use,Pain,Increased edema,Increased fascial restricitons  Visit Diagnosis: Lymphedema, not elsewhere classified  Abnormal posture  Aftercare following surgery for neoplasm  Acute pain of right shoulder  Stiffness of right shoulder, not elsewhere classified  Muscle weakness (generalized)  Disorder of the skin and subcutaneous tissue related to radiation, unspecified     Problem List Patient Active Problem List   Diagnosis Date Noted  . Depression, major, single episode, moderate ([place]) 10/17/2019  . Mild persistent asthma without complication 83/09/4598  . Port-A-Cath in place 08/04/2019  . Genetic testing 07/24/2019  . Family history of cancer   . Dysphagia 06/23/2019  . Malignant neoplasm of upper-outer quadrant of right breast in female, estrogen receptor positive ([place]) 06/21/2019  . Tobacco consumption 06/06/2019  . Breast mass, right 06/06/2019  . Sensation of change in body temperature 06/06/2019  . Neuropathic pain 06/06/2019  . Bite, insect 10/19/2016  . Swelling of eyelid, left 10/19/2016  . Essential hypertension 08/10/2016  . Disturbance of skin sensation 05/08/2013  . Cervical spondylosis with myelopathy 05/08/2013  . Abnormality of gait 08/02/2012  . Chronic arthralgias of knees and hips 08/02/2012   Donato Heinz. Owens Shark PT  Norwood Levo 06/11/2020, 5:12 PM  [place], [place], [address] Phone: [phone]   Fax:  [phone]  Name: Heidi Costa MRN: [id number] Date of Birth: [birth year]

## 2020-06-12 ENCOUNTER — Encounter: Payer: Self-pay | Admitting: Obstetrics & Gynecology

## 2020-06-12 ENCOUNTER — Ambulatory Visit (INDEPENDENT_AMBULATORY_CARE_PROVIDER_SITE_OTHER): Payer: BC Managed Care – PPO | Admitting: Obstetrics & Gynecology

## 2020-06-12 ENCOUNTER — Other Ambulatory Visit: Payer: Self-pay

## 2020-06-12 VITALS — BP 130/78 | Ht 65.0 in | Wt 241.0 lb

## 2020-06-12 DIAGNOSIS — Z789 Other specified health status: Secondary | ICD-10-CM | POA: Diagnosis not present

## 2020-06-12 DIAGNOSIS — Z6841 Body Mass Index (BMI) 40.0 and over, adult: Secondary | ICD-10-CM

## 2020-06-12 DIAGNOSIS — Z01419 Encounter for gynecological examination (general) (routine) without abnormal findings: Secondary | ICD-10-CM | POA: Diagnosis not present

## 2020-06-12 DIAGNOSIS — C50411 Malignant neoplasm of upper-outer quadrant of right female breast: Secondary | ICD-10-CM

## 2020-06-12 DIAGNOSIS — Z170 Estrogen receptor positive status [ER+]: Secondary | ICD-10-CM

## 2020-06-12 NOTE — Progress Notes (Signed)
Heidi Costa 16-May-1980 [id number]   History:    40 y.o. G1P1L1  Married.  Son is 18 yo.  RP:  New patient presenting for annual gyn exam   HPI: Menses regular normal.  No pelvic pain.  Using condoms.  H/O Rt Breast Ca Stage IIB with Estrogen receptors positive post Rt lumpectomy/Radiation therapy/Chemotherapy/Arimidex.  Genetic testing negative.  Urine/BMs normal.  BMI 40.1.  Health labs with [facility] NP.  Past medical history,surgical history, family history and social history were all reviewed and documented in the EPIC chart.  Gynecologic History Patient's last menstrual period was 05/28/2020.  Obstetric History OB History  Gravida Para Term Preterm AB Living  1       0 1  SAB IAB Ectopic Multiple Live Births  0   0        # Outcome Date GA Lbr Len/2nd Weight Sex Delivery Anes PTL Lv  1 Gravida              ROS: A ROS was performed and pertinent positives and negatives are included in the history.  GENERAL: No fevers or chills. HEENT: No change in vision, no earache, sore throat or sinus congestion. NECK: No pain or stiffness. CARDIOVASCULAR: No chest pain or pressure. No palpitations. PULMONARY: No shortness of breath, cough or wheeze. GASTROINTESTINAL: No abdominal pain, nausea, vomiting or diarrhea, melena or bright red blood per rectum. GENITOURINARY: No urinary frequency, urgency, hesitancy or dysuria. MUSCULOSKELETAL: No joint or muscle pain, no back pain, no recent trauma. DERMATOLOGIC: No rash, no itching, no lesions. ENDOCRINE: No polyuria, polydipsia, no heat or cold intolerance. No recent change in weight. HEMATOLOGICAL: No anemia or easy bruising or bleeding. NEUROLOGIC: No headache, seizures, numbness, tingling or weakness. PSYCHIATRIC: No depression, no loss of interest in normal activity or change in sleep pattern.     Exam:   BP 130/78 (BP Location: Right Arm, Patient Position: Sitting, Cuff Size: Large)   Ht 5\' 5"  (1.651 m)   Wt 241 lb (109.3 kg)   LMP  05/28/2020   BMI 40.10 kg/m   Body mass index is 40.1 kg/m.  General appearance : Well developed well nourished female. No acute distress HEENT: Eyes: no retinal hemorrhage or exudates,  Neck supple, trachea midline, no carotid bruits, no thyroidmegaly Lungs: Clear to auscultation, no rhonchi or wheezes, or rib retractions  Heart: Regular rate and rhythm, no murmurs or gallops Breast:Examined in sitting and supine position were symmetrical in appearance, no palpable masses or tenderness,  no skin retraction, no nipple inversion, no nipple discharge, no skin discoloration, no axillary or supraclavicular lymphadenopathy Abdomen: no palpable masses or tenderness, no rebound or guarding Extremities: no edema or skin discoloration or tenderness  Pelvic: Vulva: Normal             Vagina: No gross lesions or discharge  Cervix: No gross lesions or discharge  Uterus  AV, normal size, shape and consistency, non-tender and mobile  Adnexa  Without masses or tenderness  Anus: Normal   Assessment/Plan:  40 y.o. female for annual exam   1. Well female exam with routine gynecological exam Normal gynecologic exam.  Pap negative 08/2019, no indication to repeat a Pap test at this time.  Breast exam normal.  Smoker, strongly recommend smoking cessation.  2. Use of condoms for contraception  3. Malignant neoplasm of upper-outer quadrant of right breast in female, estrogen receptor positive ([place]) Rt breast lumpectomy/Radiation Therapy/Chemotherapy/Arimidex. Genetic screening negative. Considering prophylactic LPS BSO.  4. Class 3 severe obesity due to excess calories with serious comorbidity and body mass index (BMI) of 40.0 to 44.9 in adult [facility]) Recommend a lower calorie/carb diet.  Aerobic activities 5 times a week with light weight lifting every 2 days.  Princess Bruins MD, 12:10 PM 06/12/2020

## 2020-06-13 ENCOUNTER — Inpatient Hospital Stay: Payer: BC Managed Care – PPO

## 2020-06-13 ENCOUNTER — Ambulatory Visit: Payer: BC Managed Care – PPO | Admitting: Physical Therapy

## 2020-06-13 VITALS — BP 127/64 | HR 78 | Temp 98.3°F | Resp 18

## 2020-06-13 DIAGNOSIS — C50411 Malignant neoplasm of upper-outer quadrant of right female breast: Secondary | ICD-10-CM | POA: Diagnosis not present

## 2020-06-13 MED ORDER — DIPHENHYDRAMINE HCL 25 MG PO CAPS
ORAL_CAPSULE | ORAL | Status: AC
  Filled 2020-06-13: qty 2

## 2020-06-13 MED ORDER — SODIUM CHLORIDE 0.9% FLUSH
10.0000 mL | INTRAVENOUS | Status: DC | PRN
  Administered 2020-06-13: 10 mL
  Filled 2020-06-13: qty 10

## 2020-06-13 MED ORDER — TRASTUZUMAB-DKST CHEMO 150 MG IV SOLR
6.0000 mg/kg | Freq: Once | INTRAVENOUS | Status: AC
  Administered 2020-06-13: 630 mg via INTRAVENOUS
  Filled 2020-06-13: qty 30

## 2020-06-13 MED ORDER — HEPARIN SOD (PORK) LOCK FLUSH 100 UNIT/ML IV SOLN
500.0000 [IU] | Freq: Once | INTRAVENOUS | Status: AC | PRN
  Administered 2020-06-13: 500 [IU]
  Filled 2020-06-13: qty 5

## 2020-06-13 MED ORDER — SODIUM CHLORIDE 0.9 % IV SOLN
Freq: Once | INTRAVENOUS | Status: AC
  Filled 2020-06-13: qty 250

## 2020-06-13 MED ORDER — ACETAMINOPHEN 325 MG PO TABS
650.0000 mg | ORAL_TABLET | Freq: Once | ORAL | Status: AC
  Administered 2020-06-13: 650 mg via ORAL

## 2020-06-13 MED ORDER — DIPHENHYDRAMINE HCL 25 MG PO CAPS
50.0000 mg | ORAL_CAPSULE | Freq: Once | ORAL | Status: AC
  Administered 2020-06-13: 50 mg via ORAL

## 2020-06-13 MED ORDER — SODIUM CHLORIDE 0.9 % IV SOLN
420.0000 mg | Freq: Once | INTRAVENOUS | Status: AC
  Administered 2020-06-13: 420 mg via INTRAVENOUS
  Filled 2020-06-13: qty 14

## 2020-06-13 MED ORDER — ACETAMINOPHEN 325 MG PO TABS
ORAL_TABLET | ORAL | Status: AC
  Filled 2020-06-13: qty 2

## 2020-06-13 NOTE — Patient Instructions (Signed)
[place] Discharge Instructions for Patients Receiving Chemotherapy  Today you received the following chemotherapy agents Trastuzumab-dkst ([place]) & Pertuzumab (PERJETA).  To help prevent nausea and vomiting after your treatment, we encourage you to take your nausea medication as prescribed.   If you develop nausea and vomiting that is not controlled by your nausea medication, call the clinic.   BELOW ARE SYMPTOMS THAT SHOULD BE REPORTED IMMEDIATELY:  *FEVER GREATER THAN 100.5 F  *CHILLS WITH OR WITHOUT FEVER  NAUSEA AND VOMITING THAT IS NOT CONTROLLED WITH YOUR NAUSEA MEDICATION  *UNUSUAL SHORTNESS OF BREATH  *UNUSUAL BRUISING OR BLEEDING  TENDERNESS IN MOUTH AND THROAT WITH OR WITHOUT PRESENCE OF ULCERS  *URINARY PROBLEMS  *BOWEL PROBLEMS  UNUSUAL RASH Items with * indicate a potential emergency and should be followed up as soon as possible.  Feel free to call the clinic should you have any questions or concerns. The clinic phone number is [phone].  Please show the [place] at check-in to the Emergency Department and triage nurse.

## 2020-06-18 ENCOUNTER — Ambulatory Visit: Payer: BC Managed Care – PPO

## 2020-06-18 ENCOUNTER — Other Ambulatory Visit: Payer: Self-pay

## 2020-06-18 DIAGNOSIS — I890 Lymphedema, not elsewhere classified: Secondary | ICD-10-CM

## 2020-06-18 DIAGNOSIS — M25511 Pain in right shoulder: Secondary | ICD-10-CM

## 2020-06-18 DIAGNOSIS — M6281 Muscle weakness (generalized): Secondary | ICD-10-CM

## 2020-06-18 DIAGNOSIS — R293 Abnormal posture: Secondary | ICD-10-CM

## 2020-06-18 DIAGNOSIS — Z483 Aftercare following surgery for neoplasm: Secondary | ICD-10-CM

## 2020-06-18 DIAGNOSIS — C50411 Malignant neoplasm of upper-outer quadrant of right female breast: Secondary | ICD-10-CM

## 2020-06-18 DIAGNOSIS — M25611 Stiffness of right shoulder, not elsewhere classified: Secondary | ICD-10-CM

## 2020-06-18 NOTE — Therapy (Signed)
[place], [place], [address] Phone: [phone]   Fax:  [phone]  Physical Therapy Treatment  Patient Details  Name: Heidi Costa MRN: [id number] Date of Birth: [birth year] Referring Provider (PT): Thedore Mins   Encounter Date: 06/18/2020   PT End of Session - 06/18/20 1650    Visit Number 13    Number of Visits 25    Date for PT Re-Evaluation 07/01/20    PT Start Time 1503    PT Stop Time 1549    PT Time Calculation (min) 46 min    Activity Tolerance Patient tolerated treatment well    Behavior During Therapy [facility] for tasks assessed/performed           Past Medical History:  Diagnosis Date  . Abnormality of gait 08/02/2012   resolved per patient on 07/12/19  . Anemia    iron taking in the past, not currently  . Anxiety   . Arthritis    spine- cerv.  & arm   . Asthma   . Bulging lumbar disc   . Cancer [facility])    Right Breast-starts chemo on 07/14/19  . Chronic arthralgias of knees and hips 08/02/2012  . Depression    Pt. took self off Celexa one month ago, states she did n't like how it made her feel .  Marland Kitchen Family history of cancer   . GERD (gastroesophageal reflux disease)    diet controlled  . Heart murmur    pt. been told that this is a fact, no echo or cardiac surveillance in the past   . HLD (hyperlipidemia)    no meds, diet controlled  . Hypertension   . Obesity   . Spinal stenosis     Past Surgical History:  Procedure Laterality Date  . ANTERIOR CERVICAL DECOMP/DISCECTOMY FUSION N/A 10/27/2012   Procedure: ANTERIOR CERVICAL DECOMPRESSION/DISCECTOMY FUSION 1 LEVEL;  Surgeon: Faythe Ghee, MD;  Location: [place] [facility];  Service: Neurosurgery;  Laterality: N/A;  ANTERIOR CERVICAL DECOMPRESSION/DISCECTOMY FUSION 1 LEVEL  . BREAST LUMPECTOMY WITH RADIOACTIVE SEED AND SENTINEL LYMPH NODE BIOPSY Right 12/13/2019   Procedure: RIGHT BREAST LUMPECTOMY WITH RADIOACTIVE SEED AND  RIGHT AXILLARY  SENTINEL LYMPH NODE BIOPSY, RIGHT AXILLARY NODE SEED GUIDED EXCISION, BLUE DYE INJECTION;  Surgeon: Rolm Bookbinder, MD;  Location: [place];  Service: General;  Laterality: Right;  PEC BLOCK  . CHALAZION EXCISION     L eye   . PORTACATH PLACEMENT Left 07/13/2019   Procedure: INSERTION PORT-A-CATH WITH ULTRASOUND;  Surgeon: Rolm Bookbinder, MD;  Location: [place];  Service: General;  Laterality: Left;  Marland Kitchen VAGINAL DELIVERY  2002  . WISDOM TOOTH EXTRACTION      There were no vitals filed for this visit.   Subjective Assessment - 06/18/20 1604    Subjective Heard from [employer] [facility].  They are awaiting script from Dr. Lindi Adie and then it should be covered 100%.  I am a little tender across my chest today.  I can't tell how the swelling is really.  I have tried every day to do the MLD but don't really see much difference.  Wearing the compression bras' every day and they are comfortable.    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 It measures 2.3 cm and is located in the upper outer quadrant. She has an axillary node that measures 3.7 cm and was biopsied and is positive. Her cancer is  ER positive, PR negative and HER2 positive with a Ki67 of 80%. There is also a 5 mm area on her left breast which will be followed on MRI. 12/13/19- R lumpectomy and SLNB (1 nodes- negative)  She completed !05/14/2019    Patient Stated Goals to find out what to do about the swelling in her breast and arm    Currently in Pain? No/denies    Pain Score 0-No pain    Pain Relieving Factors no real pain, just tight.    Multiple Pain Sites No                     Flowsheet Row Outpatient Rehab from 12/28/2019 in [place]  Lymphedema Life Impact Scale Total Score 7.35 %            [facility] Adult PT Treatment/Exercise - 06/18/20 0001      Shoulder Exercises: Supine    Other Supine Exercises Supine bilateral flex, scaption, horizontal abd x 5      Manual Therapy   Manual Therapy Manual Lymphatic Drainage (MLD);Myofascial release;Passive ROM    Edema Management pt advised to continue chip pack to soften fibrosis    Myofascial Release to pec major with prologed pressure and stroking pt reports she got good relief with that    Manual Lymphatic Drainage (MLD) MLD short neck,deep and superficial abdominals, bil axillary nodes, right inguinal nodes, anterior interaxillary anastomosis, right axillo- inguinal, then Right breast working towards pathways and then retracing all steps.   Passive ROM to the right shoulder into flexion, abduction, ER                       PT Long Term Goals - 05/29/20 1532      PT LONG TERM GOAL #1   Title Patient will demonstrate she has regained full shoulder ROM of left shoulder without pain    Time 4    Period Weeks    Status New      PT LONG TERM GOAL #2   Title Pt will know how to manage lymphedema in right breast with self manual lymph drainage, use of compression and exercise    Time 4    Period Weeks    Status New      PT LONG TERM GOAL #3   Title Pt will be independent in a home exercise program for shoulder ROM, strength and general fitness    Time 4    Period Weeks    Status New                 Plan - 06/18/20 1651    Clinical Impression Statement Despite compliance with wearing compression bras and self breast MLD,     pt continues with significantly edemetous right breast with chest and trunkal swelling and hyperplasia especially at the inferior medial breast.  She also continues with enlarged pores throughout the right breast.  Foam chip pack does help soften the area of fibrosis some.  She has made minimal improvement with conservative therapies and continues with hyperplasia, and breast and truncal edema.  She would benefit from a Flexitouch sequential compression pump to decrease swelling  and hyper plasia and to reduce her risk of infection.  She continues with tightness/tenderness in the right pectoralis muscles and had some discomfort with AROM for horizontal abduction.  She will also benefit from a Class 1 compression sleeve and gauntlet for prevention of UE lymphedema. Information sent  to [facility].    Examination-Activity Limitations Lift;Reach Overhead;Carry    Stability/Clinical Decision Making Stable/Uncomplicated    Rehab Potential Excellent    PT Frequency 2x / week    PT Duration 4 weeks    PT Treatment/Interventions ADLs/Self Care Home Management;Therapeutic exercise;Patient/family education;Manual techniques;Manual lymph drainage;Compression bandaging;Scar mobilization;Passive range of motion;Vasopneumatic Device    PT Next Visit Plan MLD to right breast, assess use of compression bras and chip pack. soft tissue work to tight pec major and axilla.  review HEP teach HEP program    PT Home Exercise Plan standing scap retraction yellow x 10, extension yellow x 10, standing 3 way shoulder no resistance x 10, IR with towel stretch    Recommended Other Services demo sent to [facility] for prophylactic sleeve    Consulted and Agree with Plan of Care Patient           Patient will benefit from skilled therapeutic intervention in order to improve the following deficits and impairments:  Postural dysfunction,Decreased range of motion,Decreased knowledge of precautions,Impaired UE functional use,Pain,Increased edema,Increased fascial restricitons  Visit Diagnosis: Lymphedema, not elsewhere classified  Abnormal posture  Aftercare following surgery for neoplasm  Acute pain of right shoulder  Stiffness of right shoulder, not elsewhere classified  Muscle weakness (generalized)  Malignant neoplasm of upper-outer quadrant of right breast in female, estrogen receptor positive ([place])     Problem List Patient Active Problem List   Diagnosis Date Noted  . Depression, major,  single episode, moderate ([place]) 10/17/2019  . Mild persistent asthma without complication 16/12/9602  . Port-A-Cath in place 08/04/2019  . Genetic testing 07/24/2019  . Family history of cancer   . Dysphagia 06/23/2019  . Malignant neoplasm of upper-outer quadrant of right breast in female, estrogen receptor positive ([place]) 06/21/2019  . Tobacco consumption 06/06/2019  . Breast mass, right 06/06/2019  . Sensation of change in body temperature 06/06/2019  . Neuropathic pain 06/06/2019  . Bite, insect 10/19/2016  . Swelling of eyelid, left 10/19/2016  . Essential hypertension 08/10/2016  . Disturbance of skin sensation 05/08/2013  . Cervical spondylosis with myelopathy 05/08/2013  . Abnormality of gait 08/02/2012  . Chronic arthralgias of knees and hips 08/02/2012    Claris Pong 06/18/2020, 5:02 PM  [place], [place], [address] Phone: [phone]   Fax:  [phone]  Name: LUN MURO MRN: [id number] Date of Birth: Jan 04, 1981  Cheral Almas, PT 06/18/20 5:05 PM

## 2020-06-19 ENCOUNTER — Encounter: Payer: Self-pay | Admitting: Obstetrics & Gynecology

## 2020-06-20 ENCOUNTER — Other Ambulatory Visit: Payer: Self-pay

## 2020-06-20 ENCOUNTER — Ambulatory Visit: Payer: BC Managed Care – PPO

## 2020-06-20 DIAGNOSIS — M6281 Muscle weakness (generalized): Secondary | ICD-10-CM

## 2020-06-20 DIAGNOSIS — L599 Disorder of the skin and subcutaneous tissue related to radiation, unspecified: Secondary | ICD-10-CM

## 2020-06-20 DIAGNOSIS — Z483 Aftercare following surgery for neoplasm: Secondary | ICD-10-CM

## 2020-06-20 DIAGNOSIS — R293 Abnormal posture: Secondary | ICD-10-CM

## 2020-06-20 DIAGNOSIS — I890 Lymphedema, not elsewhere classified: Secondary | ICD-10-CM

## 2020-06-20 DIAGNOSIS — C50411 Malignant neoplasm of upper-outer quadrant of right female breast: Secondary | ICD-10-CM

## 2020-06-20 DIAGNOSIS — Z170 Estrogen receptor positive status [ER+]: Secondary | ICD-10-CM

## 2020-06-20 DIAGNOSIS — M25611 Stiffness of right shoulder, not elsewhere classified: Secondary | ICD-10-CM

## 2020-06-20 DIAGNOSIS — M25511 Pain in right shoulder: Secondary | ICD-10-CM

## 2020-06-20 NOTE — Therapy (Signed)
[place], [place], [address] Phone: [phone]   Fax:  [phone]  Physical Therapy Treatment  Patient Details  Name: Heidi Costa MRN: [id number] Date of Birth: 24-Sep-1980 Referring Provider (PT): Thedore Mins   Encounter Date: 06/20/2020   PT End of Session - 06/20/20 1651    Visit Number 14    Number of Visits 25    Date for PT Re-Evaluation 07/01/20    PT Start Time 4097    PT Stop Time 1655    PT Time Calculation (min) 45 min           Past Medical History:  Diagnosis Date  . Abnormality of gait 08/02/2012   resolved per patient on 07/12/19  . Anemia    iron taking in the past, not currently  . Anxiety   . Arthritis    spine- cerv.  & arm   . Asthma   . Bulging lumbar disc   . Cancer [facility])    Right Breast-starts chemo on 07/14/19  . Chronic arthralgias of knees and hips 08/02/2012  . Depression    Pt. took self off Celexa one month ago, states she did n't like how it made her feel .  Marland Kitchen Family history of cancer   . GERD (gastroesophageal reflux disease)    diet controlled  . Heart murmur    pt. been told that this is a fact, no echo or cardiac surveillance in the past   . HLD (hyperlipidemia)    no meds, diet controlled  . Hypertension   . Obesity   . Spinal stenosis     Past Surgical History:  Procedure Laterality Date  . ANTERIOR CERVICAL DECOMP/DISCECTOMY FUSION N/A 10/27/2012   Procedure: ANTERIOR CERVICAL DECOMPRESSION/DISCECTOMY FUSION 1 LEVEL;  Surgeon: Faythe Ghee, MD;  Location: [place] [facility];  Service: Neurosurgery;  Laterality: N/A;  ANTERIOR CERVICAL DECOMPRESSION/DISCECTOMY FUSION 1 LEVEL  . BREAST LUMPECTOMY WITH RADIOACTIVE SEED AND SENTINEL LYMPH NODE BIOPSY Right 12/13/2019   Procedure: RIGHT BREAST LUMPECTOMY WITH RADIOACTIVE SEED AND RIGHT AXILLARY  SENTINEL LYMPH NODE BIOPSY, RIGHT AXILLARY NODE SEED GUIDED EXCISION, BLUE DYE INJECTION;  Surgeon:  Rolm Bookbinder, MD;  Location: [place];  Service: General;  Laterality: Right;  PEC BLOCK  . CHALAZION EXCISION     L eye   . PORTACATH PLACEMENT Left 07/13/2019   Procedure: INSERTION PORT-A-CATH WITH ULTRASOUND;  Surgeon: Rolm Bookbinder, MD;  Location: [place];  Service: General;  Laterality: Left;  Marland Kitchen VAGINAL DELIVERY  2002  . WISDOM TOOTH EXTRACTION      There were no vitals filed for this visit.   Subjective Assessment - 06/20/20 1614    Subjective [employer] is shipping the unit to me and they will call me to set up a time. She is unable to get fit for sleeve on April 1 because of work committments. Eveything else is going well. Still have the breast swelling but the bra seems to help.  Using the chip pack seems to help.    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 It measures 2.3 cm and is located in the upper outer quadrant. She has an axillary node that measures 3.7 cm and was biopsied and is positive. Her cancer is ER positive, PR negative and HER2 positive with a Ki67 of 80%. There is also a 5 mm area on her left breast which will be followed  on MRI. 12/13/19- R lumpectomy and SLNB (1 nodes- negative)  She completed !05/14/2019                     Flowsheet Row [facility]  Lymphedema Life Impact Scale Total Score 7.35 %            [facility] Adult PT Treatment/Exercise - 06/20/20 0001      Shoulder Exercises: Stretch   Corner Stretch 3 reps;Other (comment)   15 seconds     Manual Therapy   Edema Management pt advised to continue chip pack to soften fibrosis.  Pt given script to get compression sleeve.    Manual Lymphatic Drainage (MLD) MLD short neck,5 breaths, bil axillary nodes, right inguinal nodes, anterior interaxillary anastomosis, right axillo- inguinal, then Right breast working towards pathways  and then retracing all steps, Pt instructed in and practiced the same  with head of table elevated.    Passive ROM to the right shoulder into flexion, abduction, ER                  PT Education - 06/20/20 1659    Education Details Pt was educated in corner stretch for Pec major    Person(s) Educated Patient    Methods Explanation;Demonstration;Handout    Comprehension Returned demonstration               PT Long Term Goals - 05/29/20 1532      PT LONG TERM GOAL #1   Title Patient will demonstrate she has regained full shoulder ROM of left shoulder without pain    Time 4    Period Weeks    Status New      PT LONG TERM GOAL #2   Title Pt will know how to manage lymphedema in right breast with self manual lymph drainage, use of compression and exercise    Time 4    Period Weeks    Status New      PT LONG TERM GOAL #3   Title Pt will be independent in a home exercise program for shoulder ROM, strength and general fitness    Time 4    Period Weeks    Status New                 Plan - 06/20/20 1703    Clinical Impression Statement Pt continues with significant right breast swelling with some mild fibrosis today which improves with her chip pack.  We reviewed self breast MLD and pt demonstrates good improvement in technique.  She should get her flexitouch in about 5 days and they will go out to set it up for her.  She will follow up here end of next week or early the following week to see how flexi touch is going and to assess breast swelling. Shoulder ROM is doing well but she did have some discomfort with PROM ER today in the posterior arm, despite being able to do stargazer stretch with full ROM and no pain.  She was given script to get sleeve at [facility] and she will call them to make an appt.    Examination-Activity Limitations Lift;Reach Overhead;Carry    Stability/Clinical Decision Making Stable/Uncomplicated    Rehab Potential Excellent    PT  Frequency 2x / week    PT Treatment/Interventions ADLs/Self Care Home Management;Therapeutic exercise;Patient/family education;Manual techniques;Manual lymph drainage;Compression bandaging;Scar mobilization;Passive range of motion;Vasopneumatic Device    PT Next Visit  Plan assess benefit of flexi touch, check shoulder ROM, review MLD prn.  Pt wants to same some visits in case she needs them later. 1-2 more visits ?    PT Home Exercise Plan standing scap retraction yellow x 10, extension yellow x 10, standing 3 way shoulder no resistance x 10, IR with towel stretch    Recommended Other Services sleeve 100% covered but she is unable to come 06/28/20 so will go [facility].    Consulted and Agree with Plan of Care Patient           Patient will benefit from skilled therapeutic intervention in order to improve the following deficits and impairments:  Postural dysfunction,Decreased range of motion,Decreased knowledge of precautions,Impaired UE functional use,Pain,Increased edema,Increased fascial restricitons  Visit Diagnosis: Lymphedema, not elsewhere classified  Abnormal posture  Aftercare following surgery for neoplasm  Acute pain of right shoulder  Stiffness of right shoulder, not elsewhere classified  Muscle weakness (generalized)  Malignant neoplasm of upper-outer quadrant of right breast in female, estrogen receptor positive ([facility])  Disorder of the skin and subcutaneous tissue related to radiation, unspecified     Problem List Patient Active Problem List   Diagnosis Date Noted  . Depression, major, single episode, moderate ([place]) 10/17/2019  . Mild persistent asthma without complication 74/82/7078  . Port-A-Cath in place 08/04/2019  . Genetic testing 07/24/2019  . Family history of cancer   . Dysphagia 06/23/2019  . Malignant neoplasm of upper-outer quadrant of right breast in female, estrogen receptor positive ([place]) 06/21/2019  . Tobacco consumption 06/06/2019  .  Breast mass, right 06/06/2019  . Sensation of change in body temperature 06/06/2019  . Neuropathic pain 06/06/2019  . Bite, insect 10/19/2016  . Swelling of eyelid, left 10/19/2016  . Essential hypertension 08/10/2016  . Disturbance of skin sensation 05/08/2013  . Cervical spondylosis with myelopathy 05/08/2013  . Abnormality of gait 08/02/2012  . Chronic arthralgias of knees and hips 08/02/2012    Claris Pong 06/20/2020, 5:10 PM  [place], [place], [address] Phone: [phone]   Fax:  [phone]  Name: Heidi Costa MRN: [id number] Date of Birth: 1980-10-02 Cheral Almas, PT 06/20/20 5:12 PM

## 2020-06-20 NOTE — Patient Instructions (Signed)
Access Code: [email or web address] URL: [email or web address] Date: 06/20/2020 Prepared by: Cheral Almas  Exercises Corner Pec Major Stretch - 2 x daily - 7 x weekly - 3 reps - 15 hold

## 2020-06-24 ENCOUNTER — Ambulatory Visit: Payer: BC Managed Care – PPO

## 2020-06-24 ENCOUNTER — Other Ambulatory Visit: Payer: Self-pay

## 2020-06-24 DIAGNOSIS — Z483 Aftercare following surgery for neoplasm: Secondary | ICD-10-CM

## 2020-06-24 NOTE — Therapy (Signed)
[place], [place], [address] Phone: [phone]   Fax:  [phone]  Physical Therapy Treatment  Patient Details  Name: Heidi Costa MRN: [id number] Date of Birth: 1980-06-23 Referring Provider (PT): Thedore Mins   Encounter Date: 06/24/2020   PT End of Session - 06/24/20 1659    Visit Number 14   # unchanged due to screen only   Number of Visits 25    Date for PT Re-Evaluation 07/01/20    PT Start Time 9371    PT Stop Time 1656    PT Time Calculation (min) 9 min    Activity Tolerance Patient tolerated treatment well    Behavior During Therapy [facility] for tasks assessed/performed           Past Medical History:  Diagnosis Date  . Abnormality of gait 08/02/2012   resolved per patient on 07/12/19  . Anemia    iron taking in the past, not currently  . Anxiety   . Arthritis    spine- cerv.  & arm   . Asthma   . Bulging lumbar disc   . Cancer [facility])    Right Breast-starts chemo on 07/14/19  . Chronic arthralgias of knees and hips 08/02/2012  . Depression    Pt. took self off Celexa one month ago, states she did n't like how it made her feel .  Marland Kitchen Family history of cancer   . GERD (gastroesophageal reflux disease)    diet controlled  . Heart murmur    pt. been told that this is a fact, no echo or cardiac surveillance in the past   . HLD (hyperlipidemia)    no meds, diet controlled  . Hypertension   . Obesity   . Spinal stenosis     Past Surgical History:  Procedure Laterality Date  . ANTERIOR CERVICAL DECOMP/DISCECTOMY FUSION N/A 10/27/2012   Procedure: ANTERIOR CERVICAL DECOMPRESSION/DISCECTOMY FUSION 1 LEVEL;  Surgeon: Faythe Ghee, MD;  Location: [place] [facility];  Service: Neurosurgery;  Laterality: N/A;  ANTERIOR CERVICAL DECOMPRESSION/DISCECTOMY FUSION 1 LEVEL  . BREAST LUMPECTOMY WITH RADIOACTIVE SEED AND SENTINEL LYMPH NODE BIOPSY Right 12/13/2019   Procedure: RIGHT BREAST  LUMPECTOMY WITH RADIOACTIVE SEED AND RIGHT AXILLARY  SENTINEL LYMPH NODE BIOPSY, RIGHT AXILLARY NODE SEED GUIDED EXCISION, BLUE DYE INJECTION;  Surgeon: Rolm Bookbinder, MD;  Location: [place];  Service: General;  Laterality: Right;  PEC BLOCK  . CHALAZION EXCISION     L eye   . PORTACATH PLACEMENT Left 07/13/2019   Procedure: INSERTION PORT-A-CATH WITH ULTRASOUND;  Surgeon: Rolm Bookbinder, MD;  Location: [place];  Service: General;  Laterality: Left;  Marland Kitchen VAGINAL DELIVERY  2002  . WISDOM TOOTH EXTRACTION      There were no vitals filed for this visit.   Subjective Assessment - 06/24/20 1652    Subjective Pt returns for her 3 month L-Dex screen.    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 It measures 2.3 cm and is located in the upper outer quadrant. She has an axillary node that measures 3.7 cm and was biopsied and is positive. Her cancer is ER positive, PR negative and HER2 positive with a Ki67 of 80%. There is also a 5 mm area on her left breast which will be followed on MRI. 12/13/19- R lumpectomy and SLNB (1 nodes- negative)  She completed !05/14/2019  L-DEX FLOWSHEETS - 06/24/20 1600      L-DEX LYMPHEDEMA SCREENING   Measurement Type Unilateral    L-DEX MEASUREMENT EXTREMITY Upper Extremity    POSITION  Standing    DOMINANT SIDE Right    At Risk Side Right    BASELINE SCORE (UNILATERAL) -1    L-DEX SCORE (UNILATERAL) -1    VALUE CHANGE (UNILAT) 0             Flowsheet Row [facility]  Lymphedema Life Impact Scale Total Score 7.35 %                         PT Long Term Goals - 05/29/20 1532      PT LONG TERM GOAL #1   Title Patient will demonstrate she has regained full shoulder ROM of left shoulder without pain    Time 4    Period Weeks    Status New      PT LONG  TERM GOAL #2   Title Pt will know how to manage lymphedema in right breast with self manual lymph drainage, use of compression and exercise    Time 4    Period Weeks    Status New      PT LONG TERM GOAL #3   Title Pt will be independent in a home exercise program for shoulder ROM, strength and general fitness    Time 4    Period Weeks    Status New                 Plan - 06/24/20 1659    Clinical Impression Statement Pt returns for her 3 month L-Dex screen. Her change from baseline of 0 is WNLs so no furhter treatment is required at this time except to cont every 3 month L-Dex screens which pt is agreeable to.    PT Next Visit Plan assess benefit of flexi touch, check shoulder ROM, review MLD prn.  Pt wants to save some visits in case she needs them later. 1-2 more visits ? ;Cont every 3 month L-Dex screens for up to 2 years from her SLNB;    Consulted and Agree with Plan of Care Patient           Patient will benefit from skilled therapeutic intervention in order to improve the following deficits and impairments:     Visit Diagnosis: Aftercare following surgery for neoplasm     Problem List Patient Active Problem List   Diagnosis Date Noted  . Depression, major, single episode, moderate ([place]) 10/17/2019  . Mild persistent asthma without complication 59/74/1638  . Port-A-Cath in place 08/04/2019  . Genetic testing 07/24/2019  . Family history of cancer   . Dysphagia 06/23/2019  . Malignant neoplasm of upper-outer quadrant of right breast in female, estrogen receptor positive ([place]) 06/21/2019  . Tobacco consumption 06/06/2019  . Breast mass, right 06/06/2019  . Sensation of change in body temperature 06/06/2019  . Neuropathic pain 06/06/2019  . Bite, insect 10/19/2016  . Swelling of eyelid, left 10/19/2016  . Essential hypertension 08/10/2016  . Disturbance of skin sensation 05/08/2013  . Cervical spondylosis with myelopathy 05/08/2013  . Abnormality of gait  08/02/2012  . Chronic arthralgias of knees and hips 08/02/2012    Otelia Limes, PTA 06/24/2020, 5:03 PM  [place], [place], [address] Phone: [phone]   Fax:  [phone]  Name: Heidi Costa MRN: [id number] Date of Birth: 26-Oct-1980

## 2020-07-02 ENCOUNTER — Encounter: Payer: Self-pay | Admitting: Hematology and Oncology

## 2020-07-03 ENCOUNTER — Telehealth: Payer: Self-pay | Admitting: *Deleted

## 2020-07-03 NOTE — Telephone Encounter (Signed)
Per MD request, RN placed call to pt to review recent bone density results.  Results show T-Score 3.4 and WNL.  Pt appreciative of the call and verbalized understanding.

## 2020-07-04 ENCOUNTER — Other Ambulatory Visit: Payer: BC Managed Care – PPO

## 2020-07-04 ENCOUNTER — Encounter: Payer: BC Managed Care – PPO | Admitting: Adult Health

## 2020-07-04 ENCOUNTER — Ambulatory Visit: Payer: BC Managed Care – PPO

## 2020-07-04 ENCOUNTER — Other Ambulatory Visit: Payer: Self-pay

## 2020-07-04 ENCOUNTER — Ambulatory Visit: Payer: BC Managed Care – PPO | Attending: Adult Health

## 2020-07-04 DIAGNOSIS — M6281 Muscle weakness (generalized): Secondary | ICD-10-CM | POA: Insufficient documentation

## 2020-07-04 DIAGNOSIS — Z483 Aftercare following surgery for neoplasm: Secondary | ICD-10-CM | POA: Insufficient documentation

## 2020-07-04 DIAGNOSIS — L599 Disorder of the skin and subcutaneous tissue related to radiation, unspecified: Secondary | ICD-10-CM | POA: Diagnosis present

## 2020-07-04 DIAGNOSIS — M25511 Pain in right shoulder: Secondary | ICD-10-CM | POA: Insufficient documentation

## 2020-07-04 DIAGNOSIS — M25611 Stiffness of right shoulder, not elsewhere classified: Secondary | ICD-10-CM | POA: Diagnosis present

## 2020-07-04 DIAGNOSIS — Z170 Estrogen receptor positive status [ER+]: Secondary | ICD-10-CM | POA: Diagnosis present

## 2020-07-04 DIAGNOSIS — C50411 Malignant neoplasm of upper-outer quadrant of right female breast: Secondary | ICD-10-CM | POA: Insufficient documentation

## 2020-07-04 DIAGNOSIS — I890 Lymphedema, not elsewhere classified: Secondary | ICD-10-CM | POA: Diagnosis present

## 2020-07-04 DIAGNOSIS — R293 Abnormal posture: Secondary | ICD-10-CM | POA: Insufficient documentation

## 2020-07-04 NOTE — Therapy (Signed)
[place], [place], [address] Phone: [phone]   Fax:  [phone]  Physical Therapy Treatment  Patient Details  Name: Heidi Costa MRN: [id number] Date of Birth: [birth year] Referring Provider (PT): Thedore Mins   Encounter Date: 07/04/2020   PT End of Session - 07/04/20 1645    Visit Number 15    Number of Visits 25    Date for PT Re-Evaluation 07/04/20    PT Start Time 1602    PT Stop Time 1640    PT Time Calculation (min) 38 min    Activity Tolerance Patient tolerated treatment well    Behavior During Therapy [facility] for tasks assessed/performed           Past Medical History:  Diagnosis Date  . Abnormality of gait 08/02/2012   resolved per patient on 07/12/19  . Anemia    iron taking in the past, not currently  . Anxiety   . Arthritis    spine- cerv.  & arm   . Asthma   . Bulging lumbar disc   . Cancer [facility])    Right Breast-starts chemo on 07/14/19  . Chronic arthralgias of knees and hips 08/02/2012  . Depression    Pt. took self off Celexa one month ago, states she did n't like how it made her feel .  Marland Kitchen Family history of cancer   . GERD (gastroesophageal reflux disease)    diet controlled  . Heart murmur    pt. been told that this is a fact, no echo or cardiac surveillance in the past   . HLD (hyperlipidemia)    no meds, diet controlled  . Hypertension   . Obesity   . Spinal stenosis     Past Surgical History:  Procedure Laterality Date  . ANTERIOR CERVICAL DECOMP/DISCECTOMY FUSION N/A 10/27/2012   Procedure: ANTERIOR CERVICAL DECOMPRESSION/DISCECTOMY FUSION 1 LEVEL;  Surgeon: Faythe Ghee, MD;  Location: [place] [facility];  Service: Neurosurgery;  Laterality: N/A;  ANTERIOR CERVICAL DECOMPRESSION/DISCECTOMY FUSION 1 LEVEL  . BREAST LUMPECTOMY WITH RADIOACTIVE SEED AND SENTINEL LYMPH NODE BIOPSY Right 12/13/2019   Procedure: RIGHT BREAST LUMPECTOMY WITH RADIOACTIVE SEED AND  RIGHT AXILLARY  SENTINEL LYMPH NODE BIOPSY, RIGHT AXILLARY NODE SEED GUIDED EXCISION, BLUE DYE INJECTION;  Surgeon: Rolm Bookbinder, MD;  Location: [place];  Service: General;  Laterality: Right;  PEC BLOCK  . CHALAZION EXCISION     L eye   . PORTACATH PLACEMENT Left 07/13/2019   Procedure: INSERTION PORT-A-CATH WITH ULTRASOUND;  Surgeon: Rolm Bookbinder, MD;  Location: [place];  Service: General;  Laterality: Left;  Marland Kitchen VAGINAL DELIVERY  2002  . WISDOM TOOTH EXTRACTION      There were no vitals filed for this visit.   Subjective Assessment - 07/04/20 1603    Subjective Pt reports getting Flexitouch on March 24th.  She has used it for about 10 days.  When I finish I can see impressions from it but dont feel like my breast is any smaller. I sometimes have trouble getting it on right.  Shoulder ROM feels good after doing the flexi touch.  Axillary region doesn't hurt with reaching like it did.  I have not gotten the sleeve.  I need to call and get appt for my sleeve. I have still been doing the MLD and the exercises.    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 It measures 2.3 cm  and is located in the upper outer quadrant. She has an axillary node that measures 3.7 cm and was biopsied and is positive. Her cancer is ER positive, PR negative and HER2 positive with a Ki67 of 80%. There is also a 5 mm area on her left breast which will be followed on MRI. 12/13/19- R lumpectomy and SLNB (1 nodes- negative)  She completed !05/14/2019              [facility] PT Assessment - 07/04/20 0001      Assessment   Medical Diagnosis Right breast cancer    Referring Provider (PT) Thedore Mins    Onset Date/Surgical Date 06/08/19    Hand Dominance Right      Prior Function   Level of Independence Independent      AROM   Right Shoulder Flexion 167 Degrees    Right Shoulder ABduction 180 Degrees    Right Shoulder  Internal Rotation 55 Degrees    Right Shoulder External Rotation 94 Degrees                 Flowsheet Row Outpatient Rehab from 12/28/2019 in [place]  Lymphedema Life Impact Scale Total Score 7.35 %                    PT Education - 07/04/20 1641    Education Details Pt was educated in IR ROM with strap behind her back to restore flexibility. She was also advised to call Verdis Frederickson about  Flexitouch to see if adjustment can be made so she gets better results with the pump.  Suggested continuing some MLD to breast areas that she doesn't feel pump is reaching. Advised her to call [facility] today to set up appt for sleeve measuring for this weekend.  Also suggested that when sleeve comes in she have them show her how to don/doff and check for proper fit.  If pt has any problems she was advised to call us so we can check on things    Person(s) Educated Patient    Methods Explanation;Handout    Comprehension Returned demonstration;Verbalized understanding               PT Long Term Goals - 07/04/20 1617      PT LONG TERM GOAL #1   Title Patient will demonstrate she has regained full shoulder ROM of left shoulder without pain    Baseline achieved all except IR behind back    Period Weeks    Status Partially Met      PT LONG TERM GOAL #2   Title Pt will know how to manage lymphedema in right breast with self manual lymph drainage, use of compression and exercise    Time 4    Period Weeks    Status Achieved      PT LONG TERM GOAL #3   Title Pt will be independent in a home exercise program for shoulder ROM, strength and general fitness    Baseline 100% better    Time 4    Period Weeks    Status Achieved      PT LONG TERM GOAL #4   Title Pt will maintain full ROM, and decreased cording as she continues radiation    Time 6    Period Weeks    Status Achieved                 Plan - 07/04/20 1646    Clinical  Impression  Statement pts shoulder ROM is WNL except for IR behind her back still limited.  She was shown again IR with strap and advised to do on a daily basis until ROM is improved.  She has a Cytogeneticist and is using nightly but has some difficulty getting into it and does not notice any overall improvement yet.  Advised her to call Verdis Frederickson and see if she is able to change anything up for her or has ideas for easier donning.  We discussed doing self MLD to areas the pump may not be reaching she has achieved all goals except IR behind back WNL.  She wanted to be released at this time so she can save some visits and she feels she is independent.    Examination-Activity Limitations Lift;Reach Overhead;Carry    Stability/Clinical Decision Making Stable/Uncomplicated    Rehab Potential Excellent    PT Frequency 2x / week    PT Duration 4 weeks    PT Treatment/Interventions ADLs/Self Care Home Management;Therapeutic exercise;Patient/family education;Manual techniques;Manual lymph drainage;Compression bandaging;Scar mobilization;Passive range of motion;Vasopneumatic Device    PT Next Visit Plan DC to HEP; Advised pt to set up appt to be fit for compression sleeve/flat knit, and to call Verdis Frederickson at [facility] if she is not getting the results she wants    PT Home Exercise Plan standing scap retraction yellow x 10, extension yellow x 10, standing 3 way shoulder no resistance x 10, IR with towel stretch    Consulted and Agree with Plan of Care Patient           Patient will benefit from skilled therapeutic intervention in order to improve the following deficits and impairments:  Postural dysfunction,Decreased range of motion,Decreased knowledge of precautions,Impaired UE functional use,Pain,Increased edema,Increased fascial restricitons  Visit Diagnosis: Aftercare following surgery for neoplasm  Lymphedema, not elsewhere classified  Abnormal posture  Acute pain of right shoulder  Stiffness of right  shoulder, not elsewhere classified  Muscle weakness (generalized)  Malignant neoplasm of upper-outer quadrant of right breast in female, estrogen receptor positive ([place])  Disorder of the skin and subcutaneous tissue related to radiation, unspecified     Problem List Patient Active Problem List   Diagnosis Date Noted  . Depression, major, single episode, moderate ([place]) 10/17/2019  . Mild persistent asthma without complication 26/94/8546  . Port-A-Cath in place 08/04/2019  . Genetic testing 07/24/2019  . Family history of cancer   . Dysphagia 06/23/2019  . Malignant neoplasm of upper-outer quadrant of right breast in female, estrogen receptor positive ([place]) 06/21/2019  . Tobacco consumption 06/06/2019  . Breast mass, right 06/06/2019  . Sensation of change in body temperature 06/06/2019  . Neuropathic pain 06/06/2019  . Bite, insect 10/19/2016  . Swelling of eyelid, left 10/19/2016  . Essential hypertension 08/10/2016  . Disturbance of skin sensation 05/08/2013  . Cervical spondylosis with myelopathy 05/08/2013  . Abnormality of gait 08/02/2012  . Chronic arthralgias of knees and hips 08/02/2012  PHYSICAL THERAPY DISCHARGE SUMMARY  Visits from Start of Care:15  Current functional level related to goals / functional outcomes: Achieved all goals except limited with IR behind back   Remaining deficits: IR behind back limited, continued breast swelling despite flexitouch, MLD, compression bra   Education / Equipment: Flexitouch, Compression bra, getting compression sleeve Plan: Patient agrees to discharge.  Patient goals were partially met. Patient is being discharged due to being pleased with the current functional level.  ?????       Claris Pong 07/04/2020, 4:52 PM  [place], [place], [address] Phone: [phone]   Fax:  [phone]  Name: Heidi Costa MRN: [id number] Date of  Birth: Aug 04, 1980 Cheral Almas, PT 07/04/20 4:56 PM

## 2020-07-04 NOTE — Patient Instructions (Signed)
Access Code: [email or web address] URL: [email or web address] Date: 07/04/2020 Prepared by: Cheral Almas  Exercises Standing Shoulder Internal Rotation Stretch with Towel - 1 x daily - 7 x weekly - 1 sets - 5-10 reps - 10 hold Standing Shoulder Internal Rotation Stretch with Hands Behind Back - 1 x daily - 7 x weekly - 1 sets - 5-10 reps - 10 hold

## 2020-07-05 ENCOUNTER — Ambulatory Visit: Payer: BC Managed Care – PPO

## 2020-07-05 ENCOUNTER — Inpatient Hospital Stay: Payer: BC Managed Care – PPO

## 2020-07-05 ENCOUNTER — Other Ambulatory Visit: Payer: BC Managed Care – PPO

## 2020-07-05 ENCOUNTER — Encounter: Payer: BC Managed Care – PPO | Admitting: Adult Health

## 2020-07-08 ENCOUNTER — Encounter: Payer: Self-pay | Admitting: *Deleted

## 2020-07-18 ENCOUNTER — Encounter: Payer: Self-pay | Admitting: *Deleted

## 2020-07-18 ENCOUNTER — Other Ambulatory Visit: Payer: Self-pay

## 2020-07-18 ENCOUNTER — Inpatient Hospital Stay: Payer: BC Managed Care – PPO

## 2020-07-18 ENCOUNTER — Encounter: Payer: BC Managed Care – PPO | Admitting: Adult Health

## 2020-07-18 ENCOUNTER — Inpatient Hospital Stay (HOSPITAL_BASED_OUTPATIENT_CLINIC_OR_DEPARTMENT_OTHER): Payer: BC Managed Care – PPO | Admitting: Medical

## 2020-07-18 ENCOUNTER — Inpatient Hospital Stay: Payer: BC Managed Care – PPO | Attending: Hematology and Oncology

## 2020-07-18 VITALS — BP 132/86 | HR 81 | Temp 97.9°F | Resp 19 | Ht 65.0 in | Wt 248.1 lb

## 2020-07-18 DIAGNOSIS — Z803 Family history of malignant neoplasm of breast: Secondary | ICD-10-CM | POA: Diagnosis not present

## 2020-07-18 DIAGNOSIS — Z170 Estrogen receptor positive status [ER+]: Secondary | ICD-10-CM

## 2020-07-18 DIAGNOSIS — Z5189 Encounter for other specified aftercare: Secondary | ICD-10-CM | POA: Diagnosis not present

## 2020-07-18 DIAGNOSIS — C50411 Malignant neoplasm of upper-outer quadrant of right female breast: Secondary | ICD-10-CM | POA: Insufficient documentation

## 2020-07-18 DIAGNOSIS — Z5111 Encounter for antineoplastic chemotherapy: Secondary | ICD-10-CM | POA: Diagnosis not present

## 2020-07-18 DIAGNOSIS — E669 Obesity, unspecified: Secondary | ICD-10-CM | POA: Insufficient documentation

## 2020-07-18 DIAGNOSIS — Z95828 Presence of other vascular implants and grafts: Secondary | ICD-10-CM

## 2020-07-18 DIAGNOSIS — R011 Cardiac murmur, unspecified: Secondary | ICD-10-CM | POA: Diagnosis not present

## 2020-07-18 DIAGNOSIS — E785 Hyperlipidemia, unspecified: Secondary | ICD-10-CM | POA: Insufficient documentation

## 2020-07-18 DIAGNOSIS — M4800 Spinal stenosis, site unspecified: Secondary | ICD-10-CM | POA: Insufficient documentation

## 2020-07-18 DIAGNOSIS — J45909 Unspecified asthma, uncomplicated: Secondary | ICD-10-CM | POA: Insufficient documentation

## 2020-07-18 DIAGNOSIS — F17210 Nicotine dependence, cigarettes, uncomplicated: Secondary | ICD-10-CM | POA: Diagnosis not present

## 2020-07-18 DIAGNOSIS — K219 Gastro-esophageal reflux disease without esophagitis: Secondary | ICD-10-CM | POA: Insufficient documentation

## 2020-07-18 DIAGNOSIS — I10 Essential (primary) hypertension: Secondary | ICD-10-CM | POA: Diagnosis not present

## 2020-07-18 LAB — CMP (CANCER CENTER ONLY)
ALT: 12 U/L (ref 0–44)
AST: 17 U/L (ref 15–41)
Albumin: 3.4 g/dL — ABNORMAL LOW (ref 3.5–5.0)
Alkaline Phosphatase: 69 U/L (ref 38–126)
Anion gap: 11 (ref 5–15)
BUN: 11 mg/dL (ref 6–20)
CO2: 25 mmol/L (ref 22–32)
Calcium: 8.8 mg/dL — ABNORMAL LOW (ref 8.9–10.3)
Chloride: 106 mmol/L (ref 98–111)
Creatinine: 0.8 mg/dL (ref 0.44–1.00)
GFR, Estimated: >60 mL/min (ref >60)
Glucose, Bld: 114 mg/dL — ABNORMAL HIGH (ref 70–99)
Potassium: 3.6 mmol/L (ref 3.5–5.1)
Sodium: 142 mmol/L (ref 135–145)
Total Bilirubin: 0.2 mg/dL — ABNORMAL LOW (ref 0.3–1.2)
Total Protein: 6.6 g/dL (ref 6.5–8.1)

## 2020-07-18 LAB — CBC WITH DIFFERENTIAL (CANCER CENTER ONLY)
Abs Immature Granulocytes: 0.02 10*3/uL (ref 0.00–0.07)
Basophils Absolute: 0 10*3/uL (ref 0.0–0.1)
Basophils Relative: 1 %
Eosinophils Absolute: 0.2 10*3/uL (ref 0.0–0.5)
Eosinophils Relative: 4 %
HCT: 35.9 % — ABNORMAL LOW (ref 36.0–46.0)
Hemoglobin: 11.9 g/dL — ABNORMAL LOW (ref 12.0–15.0)
Immature Granulocytes: 0 %
Lymphocytes Relative: 29 %
Lymphs Abs: 1.6 10*3/uL (ref 0.7–4.0)
MCH: 32.8 pg (ref 26.0–34.0)
MCHC: 33.1 g/dL (ref 30.0–36.0)
MCV: 98.9 fL (ref 80.0–100.0)
Monocytes Absolute: 0.5 10*3/uL (ref 0.1–1.0)
Monocytes Relative: 9 %
Neutro Abs: 3.1 10*3/uL (ref 1.7–7.7)
Neutrophils Relative %: 57 %
Platelet Count: 339 10*3/uL (ref 150–400)
RBC: 3.63 MIL/uL — ABNORMAL LOW (ref 3.87–5.11)
RDW: 12.3 % (ref 11.5–15.5)
WBC Count: 5.4 10*3/uL (ref 4.0–10.5)
nRBC: 0 % (ref 0.0–0.2)

## 2020-07-18 MED ORDER — SODIUM CHLORIDE 0.9% FLUSH
10.0000 mL | Freq: Once | INTRAVENOUS | Status: AC
  Administered 2020-07-18: 10 mL
  Filled 2020-07-18: qty 10

## 2020-07-18 MED ORDER — ACETAMINOPHEN 325 MG PO TABS
ORAL_TABLET | ORAL | Status: AC
  Filled 2020-07-18: qty 2

## 2020-07-18 MED ORDER — ACETAMINOPHEN 325 MG PO TABS
650.0000 mg | ORAL_TABLET | Freq: Once | ORAL | Status: AC
  Administered 2020-07-18: 650 mg via ORAL

## 2020-07-18 MED ORDER — DIPHENHYDRAMINE HCL 25 MG PO CAPS
50.0000 mg | ORAL_CAPSULE | Freq: Once | ORAL | Status: AC
  Administered 2020-07-18: 50 mg via ORAL

## 2020-07-18 MED ORDER — GOSERELIN ACETATE 3.6 MG ~~LOC~~ IMPL
3.6000 mg | DRUG_IMPLANT | Freq: Once | SUBCUTANEOUS | Status: AC
  Administered 2020-07-18: 3.6 mg via SUBCUTANEOUS

## 2020-07-18 MED ORDER — TRASTUZUMAB-DKST CHEMO 150 MG IV SOLR
6.0000 mg/kg | Freq: Once | INTRAVENOUS | Status: AC
  Administered 2020-07-18: 630 mg via INTRAVENOUS
  Filled 2020-07-18: qty 30

## 2020-07-18 MED ORDER — DIPHENHYDRAMINE HCL 25 MG PO CAPS
ORAL_CAPSULE | ORAL | Status: AC
  Filled 2020-07-18: qty 2

## 2020-07-18 MED ORDER — SODIUM CHLORIDE 0.9 % IV SOLN
Freq: Once | INTRAVENOUS | Status: AC
Start: 2020-07-18 — End: 2020-07-18
  Filled 2020-07-18: qty 250

## 2020-07-18 MED ORDER — HEPARIN SOD (PORK) LOCK FLUSH 100 UNIT/ML IV SOLN
500.0000 [IU] | Freq: Once | INTRAVENOUS | Status: AC | PRN
  Administered 2020-07-18: 500 [IU]
  Filled 2020-07-18: qty 5

## 2020-07-18 MED ORDER — SODIUM CHLORIDE 0.9 % IV SOLN
420.0000 mg | Freq: Once | INTRAVENOUS | Status: AC
  Administered 2020-07-18: 420 mg via INTRAVENOUS
  Filled 2020-07-18: qty 14

## 2020-07-18 MED ORDER — SODIUM CHLORIDE 0.9% FLUSH
10.0000 mL | INTRAVENOUS | Status: DC | PRN
  Filled 2020-07-18: qty 10

## 2020-07-18 MED ORDER — GOSERELIN ACETATE 3.6 MG ~~LOC~~ IMPL
DRUG_IMPLANT | SUBCUTANEOUS | Status: AC
  Filled 2020-07-18: qty 3.6

## 2020-07-18 NOTE — Patient Instructions (Signed)
Excuse from Work, [employer], or Physical Activity  [name]  needs to be excused from work.  This is effective for the following dates: 07/18/2020  She may return to work or school, with full physical activity on 07/19/2020.  Health care provider name (printed): Sandi Mealy, MHS, PA-C    Health care provider (signature): _________________________________________________________    Date: 07/18/2020

## 2020-07-18 NOTE — Patient Instructions (Signed)
Implanted Port Insertion, Care After This sheet gives you information about how to care for yourself after your procedure. Your health care provider may also give you more specific instructions. If you have problems or questions, contact your health care provider. What can I expect after the procedure? After the procedure, it is common to have:  Discomfort at the port insertion site.  Bruising on the skin over the port. This should improve over 3-4 days. Follow these instructions at home: Port care  After your port is placed, you will get a manufacturer's information card. The card has information about your port. Keep this card with you at all times.  Take care of the port as told by your health care provider. Ask your health care provider if you or a family member can get training for taking care of the port at home. A home health care nurse may also take care of the port.  Make sure to remember what type of port you have. Incision care  Follow instructions from your health care provider about how to take care of your port insertion site. Make sure you: ? Wash your hands with soap and water before and after you change your bandage (dressing). If soap and water are not available, use hand sanitizer. ? Change your dressing as told by your health care provider. ? Leave stitches (sutures), skin glue, or adhesive strips in place. These skin closures may need to stay in place for 2 weeks or longer. If adhesive strip edges start to loosen and curl up, you may trim the loose edges. Do not remove adhesive strips completely unless your health care provider tells you to do that.  Check your port insertion site every day for signs of infection. Check for: ? Redness, swelling, or pain. ? Fluid or blood. ? Warmth. ? Pus or a bad smell.      Activity  Return to your normal activities as told by your health care provider. Ask your health care provider what activities are safe for you.  Do not  lift anything that is heavier than 10 lb (4.5 kg), or the limit that you are told, until your health care provider says that it is safe. General instructions  Take over-the-counter and prescription medicines only as told by your health care provider.  Do not take baths, swim, or use a hot tub until your health care provider approves. Ask your health care provider if you may take showers. You may only be allowed to take sponge baths.  Do not drive for 24 hours if you were given a sedative during your procedure.  Wear a medical alert bracelet in case of an emergency. This will tell any health care providers that you have a port.  Keep all follow-up visits as told by your health care provider. This is important. Contact a health care provider if:  You cannot flush your port with saline as directed, or you cannot draw blood from the port.  You have a fever or chills.  You have redness, swelling, or pain around your port insertion site.  You have fluid or blood coming from your port insertion site.  Your port insertion site feels warm to the touch.  You have pus or a bad smell coming from the port insertion site. Get help right away if:  You have chest pain or shortness of breath.  You have bleeding from your port that you cannot control. Summary  Take care of the port as told by your   health care provider. Keep the manufacturer's information card with you at all times.  Change your dressing as told by your health care provider.  Contact a health care provider if you have a fever or chills or if you have redness, swelling, or pain around your port insertion site.  Keep all follow-up visits as told by your health care provider. This information is not intended to replace advice given to you by your health care provider. Make sure you discuss any questions you have with your health care provider. Document Revised: 10/12/2017 Document Reviewed: 10/12/2017 Elsevier Patient Education   2021 Elsevier Inc.  

## 2020-07-18 NOTE — Progress Notes (Signed)
Symptoms Management Clinic Progress Note   Heidi Costa [id number] [birth year] 40 y.o.  Heidi Costa is managed by Dr. Nicholas Lose  Actively treated with chemotherapy/immunotherapy/hormonal therapy: yes  Current therapy: Arther Dames, and Faslodex  Last treated: 06/13/2020 (cycle 10, day 1)  Next scheduled appointment with provider: No follow-up appointment has yet been scheduled.  Assessment: Plan:    Malignant neoplasm of upper-outer quadrant of right breast in female, estrogen receptor positive ([place])   ER positive malignant neoplasm of the right breast: Heidi Costa presents to the clinic today for ongoing treatment with Perjeta, Ogivri, and Faslodex.  We will proceed with her treatment today and we will schedule her for a follow-up appointment.  Please see After Visit Summary for patient specific instructions.  Future Appointments  Date Time Provider [place]  09/23/2020  4:45 PM Collie Siad A, PTA [facility] None    No orders of the defined types were placed in this encounter.      Subjective:   Patient ID:  Heidi Costa is a 40 y.o. (DOB 11-10-1980) female.  Chief Complaint: No chief complaint on file.   HPI Heidi Costa is a 41 y.o. female with a diagnosis of an ER positive malignant neoplasm of the right breast.  She presents to the clinic today for ongoing treatment with Perjeta, Ogivri, and Faslodex.  She denies any issues of concern.  She denies fevers, chills, sweats, nausea, vomiting, constipation, or diarrhea.  Her appetite is good.   Medications: I have reviewed the patient's current medications.  Allergies:  Allergies  Allergen Reactions  . Aspirin Rash    Past Medical History:  Diagnosis Date  . Abnormality of gait 08/02/2012   resolved per patient on 07/12/19  . Anemia    iron taking in the past, not currently  . Anxiety   . Arthritis    spine- cerv.  & arm   . Asthma   . Bulging lumbar disc   .  Cancer [facility])    Right Breast-starts chemo on 07/14/19  . Chronic arthralgias of knees and hips 08/02/2012  . Depression    Pt. took self off Celexa one month ago, states she did n't like how it made her feel .  Marland Kitchen Family history of cancer   . GERD (gastroesophageal reflux disease)    diet controlled  . Heart murmur    pt. been told that this is a fact, no echo or cardiac surveillance in the past   . HLD (hyperlipidemia)    no meds, diet controlled  . Hypertension   . Obesity   . Spinal stenosis     Past Surgical History:  Procedure Laterality Date  . ANTERIOR CERVICAL DECOMP/DISCECTOMY FUSION N/A 10/27/2012   Procedure: ANTERIOR CERVICAL DECOMPRESSION/DISCECTOMY FUSION 1 LEVEL;  Surgeon: Faythe Ghee, MD;  Location: [place] [facility];  Service: Neurosurgery;  Laterality: N/A;  ANTERIOR CERVICAL DECOMPRESSION/DISCECTOMY FUSION 1 LEVEL  . BREAST LUMPECTOMY WITH RADIOACTIVE SEED AND SENTINEL LYMPH NODE BIOPSY Right 12/13/2019   Procedure: RIGHT BREAST LUMPECTOMY WITH RADIOACTIVE SEED AND RIGHT AXILLARY  SENTINEL LYMPH NODE BIOPSY, RIGHT AXILLARY NODE SEED GUIDED EXCISION, BLUE DYE INJECTION;  Surgeon: Rolm Bookbinder, MD;  Location: [place];  Service: General;  Laterality: Right;  PEC BLOCK  . CHALAZION EXCISION     L eye   . PORTACATH PLACEMENT Left 07/13/2019   Procedure: INSERTION PORT-A-CATH WITH ULTRASOUND;  Surgeon: Rolm Bookbinder, MD;  Location: [place];  Service: General;  Laterality: Left;  Marland Kitchen VAGINAL DELIVERY  2002  . WISDOM TOOTH EXTRACTION      Family History  Problem Relation Age of Onset  . Osteoarthritis Mother   . Hypertension Mother   . Diabetes Mother   . Hypertension Father   . Gout Father   . Stroke Father 39       embolic  . Diabetes Half-Brother   . Hypertension Half-Brother   . Heart failure Half-Brother 45  . Kidney disease Half-Brother   . Diabetes Paternal Grandfather   . Cancer Paternal Grandfather 7       breast cancer    Social  History   Socioeconomic History  . Marital status: Married    Spouse name: Not on file  . Number of children: 1  . Years of education: associates  . Highest education level: Not on file  Occupational History    Employer: OTHER  Tobacco Use  . Smoking status: Current Every Day Smoker    Packs/day: 1.00    Years: 10.00    Pack years: 10.00    Types: Cigarettes    Last attempt to quit: 06/25/2019    Years since quitting: 1.0  . Smokeless tobacco: Never Used  Vaping Use  . Vaping Use: Never used  Substance and Sexual Activity  . Alcohol use: Yes    Comment: occas  . Drug use: Yes    Types: Marijuana    Comment: uses daily - last use 07/12/19  . Sexual activity: Yes    Birth control/protection: None  Other Topics Concern  . Not on file  Social History Narrative   Patient is married with one child.   Patient is right handed.   Patient has a Associate's degree.   Patient drinks 2-3 cups daily.   Social Determinants of Health   Financial Resource Strain: Not on file  Food Insecurity: Not on file  Transportation Needs: Not on file  Physical Activity: Not on file  Stress: Not on file  Social Connections: Not on file  Intimate Partner Violence: Not on file    Past Medical History, Surgical history, Social history, and Family history were reviewed and updated as appropriate.   Please see review of systems for further details on the patient's review from today.   Review of Systems:  Review of Systems  Constitutional: Negative for chills, diaphoresis and fever.  HENT: Negative for trouble swallowing and voice change.   Respiratory: Negative for cough, chest tightness, shortness of breath and wheezing.   Cardiovascular: Negative for chest pain and palpitations.  Gastrointestinal: Negative for abdominal pain, constipation, diarrhea, nausea and vomiting.  Musculoskeletal: Negative for back pain and myalgias.  Neurological: Negative for dizziness, light-headedness and  headaches.    Objective:   Physical Exam:  BP 132/86 (BP Location: Left Arm, Patient Position: Sitting)   Pulse 81   Temp 97.9 F (36.6 C) (Tympanic)   Resp 19   Ht 5\' 5"  (1.651 m)   Wt 248 lb 1.6 oz (112.5 kg)   SpO2 98%   BMI 41.29 kg/m  ECOG: 0  Physical Exam Constitutional:      General: She is not in acute distress.    Appearance: She is not diaphoretic.  HENT:     Head: Normocephalic and atraumatic.  Eyes:     General: No scleral icterus.       Right eye: No discharge.        Left eye: No discharge.  Cardiovascular:     Rate  and Rhythm: Normal rate and regular rhythm.     Heart sounds: Normal heart sounds. No murmur heard. No friction rub. No gallop.   Pulmonary:     Effort: Pulmonary effort is normal. No respiratory distress.     Breath sounds: Normal breath sounds. No wheezing or rales.  Skin:    General: Skin is warm and dry.     Findings: No erythema or rash.  Neurological:     Mental Status: She is alert.     Coordination: Coordination normal.     Gait: Gait normal.  Psychiatric:        Mood and Affect: Mood normal.        Behavior: Behavior normal.        Thought Content: Thought content normal.        Judgment: Judgment normal.     Lab Review:     Component Value Date/Time   NA 142 07/18/2020 1239   NA 141 06/06/2019 1545   K 3.6 07/18/2020 1239   CL 106 07/18/2020 1239   CO2 25 07/18/2020 1239   GLUCOSE 114 (H) 07/18/2020 1239   BUN 11 07/18/2020 1239   BUN 7 06/06/2019 1545   CREATININE 0.80 07/18/2020 1239   CREATININE 0.79 07/11/2015 1905   CALCIUM 8.8 (L) 07/18/2020 1239   PROT 6.6 07/18/2020 1239   PROT 6.5 06/06/2019 1545   ALBUMIN 3.4 (L) 07/18/2020 1239   ALBUMIN 4.4 06/06/2019 1545   AST 17 07/18/2020 1239   ALT 12 07/18/2020 1239   ALKPHOS 69 07/18/2020 1239   BILITOT 0.2 (L) 07/18/2020 1239   GFRNONAA >60 07/18/2020 1239   GFRNONAA >89 07/11/2015 1905   GFRAA >60 01/01/2020 0914   GFRAA >89 07/11/2015 1905        Component Value Date/Time   WBC 5.4 07/18/2020 1239   WBC 6.2 10/25/2012 1400   RBC 3.63 (L) 07/18/2020 1239   HGB 11.9 (L) 07/18/2020 1239   HCT 35.9 (L) 07/18/2020 1239   PLT 339 07/18/2020 1239   MCV 98.9 07/18/2020 1239   MCV 93.1 07/11/2015 1908   MCH 32.8 07/18/2020 1239   MCHC 33.1 07/18/2020 1239   RDW 12.3 07/18/2020 1239   LYMPHSABS 1.6 07/18/2020 1239   MONOABS 0.5 07/18/2020 1239   EOSABS 0.2 07/18/2020 1239   BASOSABS 0.0 07/18/2020 1239   -------------------------------  Imaging from last 24 hours (if applicable):  Radiology interpretation: No results found.    OK to treat. Last treatment today.  Sandi Mealy, MHS, PA-C Physician Assistant

## 2020-08-06 ENCOUNTER — Other Ambulatory Visit: Payer: Self-pay | Admitting: *Deleted

## 2020-08-06 ENCOUNTER — Telehealth: Payer: Self-pay | Admitting: Hematology and Oncology

## 2020-08-06 DIAGNOSIS — Z170 Estrogen receptor positive status [ER+]: Secondary | ICD-10-CM

## 2020-08-06 DIAGNOSIS — C50411 Malignant neoplasm of upper-outer quadrant of right female breast: Secondary | ICD-10-CM

## 2020-08-06 MED ORDER — BACLOFEN 10 MG PO TABS: 10.0000 mg | ORAL_TABLET | Freq: Three times a day (TID) | ORAL | 0 refills | Status: DC

## 2020-08-06 NOTE — Progress Notes (Signed)
Received call from pt with complaint of right arm lymphedema and muscle spasms.  Per MD pt needing to be seen by Lymphedema clinic for further evaluation.  Pt to also be prescribed Baclofen 10 mg p.o TID PRN for muscle spasms.  Prescription sent to pharmacy on file. Pt also requesting to have port a cath removed, RN will contact nurse with Dr. [name] to arrange port removal.

## 2020-08-06 NOTE — Telephone Encounter (Signed)
Scheduled appts per 5/10 sch msg. Pt aware.  

## 2020-08-07 ENCOUNTER — Telehealth: Payer: Self-pay | Admitting: Hematology and Oncology

## 2020-08-07 NOTE — Telephone Encounter (Signed)
Returned pt's call to r/s appts. R/s appts per pt's preference. Pt is aware.

## 2020-08-21 ENCOUNTER — Other Ambulatory Visit: Payer: BC Managed Care – PPO

## 2020-08-21 ENCOUNTER — Ambulatory Visit: Payer: BC Managed Care – PPO

## 2020-08-21 ENCOUNTER — Ambulatory Visit: Payer: BC Managed Care – PPO | Admitting: Hematology and Oncology

## 2020-08-28 ENCOUNTER — Other Ambulatory Visit: Payer: Self-pay | Admitting: Hematology and Oncology

## 2020-09-02 NOTE — Progress Notes (Signed)
Patient Care Team: System, Provider Not In as PCP - General Donnelly Angelica, RN as Oncology Nurse Navigator Pershing Proud, RN as Oncology Nurse Navigator Emelia Loron, MD as Consulting Physician (General Surgery) Serena Croissant, MD as Consulting Physician (Hematology and Oncology) Dorothy Puffer, MD as Consulting Physician (Radiation Oncology)  DIAGNOSIS:    ICD-10-CM   1. Malignant neoplasm of upper-outer quadrant of right breast in female, estrogen receptor positive (HCC)  C50.411    Z17.0     SUMMARY OF ONCOLOGIC HISTORY: Oncology History  Malignant neoplasm of upper-outer quadrant of right breast in female, estrogen receptor positive (HCC)  06/21/2019 Initial Diagnosis   Patient palpated a painful right breast lump x3 weeks. Mammogram showed a 2.0cm right breast mass at the 10 o'clock position, a 3.6cm enlarged right axillary lymph node, and a 0.5cm left breast mass. Biopsy showed, in the right breast and axilla, IDC, grade 3, HER-2 equivocal by IHC, + by FISH, ER+ 40%, PR - 0%, Ki67 80%, and in the left breast, fibrocystic changes, no malignancy.    07/04/2019 Cancer Staging   Staging form: Breast, AJCC 8th Edition - Clinical stage from 07/04/2019: Stage IIB (cT2, cN1(f), cM0, G3, ER+, PR-, HER2+) - Signed by Serena Croissant, MD on 07/05/2019   07/14/2019 - 10/07/2019 Chemotherapy   The patient had dexamethasone (DECADRON) 4 MG tablet, 4 mg (100 % of original dose 4 mg), Oral, Daily, 1 of 1 cycle, Start date: 07/05/2019, End date: 08/04/2019 Dose modification: 4 mg (original dose 4 mg, Cycle 0) palonosetron (ALOXI) injection 0.25 mg, 0.25 mg, Intravenous,  Once, 5 of 5 cycles Administration: 0.25 mg (07/14/2019), 0.25 mg (08/04/2019), 0.25 mg (10/05/2019), 0.25 mg (08/25/2019), 0.25 mg (09/14/2019) pegfilgrastim-jmdb (FULPHILA) injection 6 mg, 6 mg, Subcutaneous,  Once, 5 of 5 cycles Administration: 6 mg (07/17/2019), 6 mg (08/07/2019), 6 mg (10/07/2019), 6 mg (08/29/2019), 6 mg  (09/16/2019) CARBOplatin (PARAPLATIN) 700 mg in sodium chloride 0.9 % 250 mL chemo infusion, 700 mg (100 % of original dose 700 mg), Intravenous,  Once, 5 of 5 cycles Dose modification: 700 mg (original dose 700 mg, Cycle 1, Reason: Provider Judgment), 600 mg (original dose 700 mg, Cycle 2, Reason: Dose not tolerated) Administration: 700 mg (07/14/2019), 600 mg (08/04/2019), 600 mg (10/05/2019), 600 mg (08/25/2019), 600 mg (09/14/2019) DOCEtaxel (TAXOTERE) 170 mg in sodium chloride 0.9 % 250 mL chemo infusion, 75 mg/m2 = 170 mg, Intravenous,  Once, 5 of 5 cycles Dose modification: 65 mg/m2 (original dose 75 mg/m2, Cycle 2, Reason: Dose not tolerated) Administration: 170 mg (07/14/2019), 150 mg (08/04/2019), 150 mg (10/05/2019), 150 mg (08/25/2019), 150 mg (09/14/2019) fosaprepitant (EMEND) 150 mg in sodium chloride 0.9 % 145 mL IVPB, 150 mg, Intravenous,  Once, 5 of 5 cycles Administration: 150 mg (07/14/2019), 150 mg (08/04/2019), 150 mg (10/05/2019), 150 mg (08/25/2019), 150 mg (09/14/2019) pertuzumab (PERJETA) 840 mg in sodium chloride 0.9 % 250 mL chemo infusion, 840 mg, Intravenous, Once, 5 of 5 cycles Administration: 840 mg (07/14/2019), 420 mg (08/04/2019), 420 mg (10/05/2019), 420 mg (08/25/2019), 420 mg (09/14/2019) trastuzumab-dkst (OGIVRI) 900 mg in sodium chloride 0.9 % 250 mL chemo infusion, 903 mg, Intravenous,  Once, 5 of 5 cycles Administration: 900 mg (07/14/2019), 672 mg (08/04/2019), 672 mg (10/05/2019), 672 mg (08/25/2019), 672 mg (09/14/2019)  for chemotherapy treatment.    07/22/2019 Genetic Testing   Negative genetic testing:  No pathogenic variants detected on the [facility] Common Hereditary Cancers Panel. The report date is 07/22/2019.  The Common Hereditary Cancers Panel  offered by [facility] includes sequencing and/or deletion duplication testing of the following 48 genes: APC, ATM, AXIN2, BARD1, BMPR1A, BRCA1, BRCA2, BRIP1, CDH1, CDK4, CDKN2A (p14ARF), CDKN2A (p16INK4a), CHEK2, CTNNA1, DICER1, EPCAM  (Deletion/duplication testing only), GREM1 (promoter region deletion/duplication testing only), KIT, MEN1, MLH1, MSH2, MSH3, MSH6, MUTYH, NBN, NF1, NHTL1, PALB2, PDGFRA, PMS2, POLD1, POLE, PTEN, RAD50, RAD51C, RAD51D, RNF43, SDHB, SDHC, SDHD, SMAD4, SMARCA4. STK11, TP53, TSC1, TSC2, and VHL.  The following genes were evaluated for sequence changes only: SDHA and HOXB13 c.251G>A variant only.    11/20/2019 -  Chemotherapy      Patient is on Antibody Plan: BREAST TRASTUZUMAB + PERTUZUMAB Q21D    12/13/2019 Surgery   Right lumpectomy Heidi Costa): IDC, 5 right axillary lymph nodes negative for carcinoma.    01/22/2020 - 03/12/2020 Radiation Therapy   Site/dose:   The patient initially received a dose of 50.4 Gy in 28 fractions to the breast using whole-breast tangent fields. This was delivered using a 3-D conformal technique. The patient then received a boost to the seroma. This delivered an additional 10 Gy in 5 fractions using a 3-field photon boost technique. The total dose was 60.4 Gy.       CHIEF COMPLIANT: Herceptin and Perjeta maintenance  INTERVAL HISTORY: ILEE Costa is a 40 y.o. with above-mentioned history of HER-2 positive breast cancer whocompletedneoadjuvant chemotherapy, underwent a right lumpectomy, radiation, Herceptin Perjeta maintenance,and is currently on anastrozole and goserelin.She presents to the clinic todayfor follow-up.  ALLERGIES:  is allergic to aspirin.  MEDICATIONS:  Current Outpatient Medications  Medication Sig Dispense Refill  . baclofen (LIORESAL) 10 MG tablet Take 1 tablet (10 mg total) by mouth 3 (three) times daily. 90 each 0  . acetaminophen (TYLENOL) 500 MG tablet Take 500-1,000 mg by mouth daily as needed for pain.     Marland Kitchen albuterol (VENTOLIN HFA) 108 (90 Base) MCG/ACT inhaler Inhale 2 puffs into the lungs every 6 (six) hours as needed for wheezing or shortness of breath. 18 g 2  . amLODipine (NORVASC) 5 MG tablet Take 1 tablet (5 mg total) by  mouth daily. 90 tablet 1  . anastrozole (ARIMIDEX) 1 MG tablet Take 1 tablet (1 mg total) by mouth daily. 90 tablet 3  . buPROPion (WELLBUTRIN XL) 300 MG 24 hr tablet Take 1 tablet (300 mg total) by mouth daily. 1 tab qam x 7 days then increase to 2 pills 90 tablet 1  . dicyclomine (BENTYL) 10 MG capsule TAKE 1 CAPSULE BY MOUTH THREE TIMES DAILY AS NEEDED FOR MUSCLE SPASM (CRAMPS  AND  SPASMS) 30 capsule 0  . fluticasone (FLOVENT HFA) 44 MCG/ACT inhaler Inhale 1 puff into the lungs 2 (two) times daily. 1 Inhaler 5  . lidocaine-prilocaine (EMLA) cream SMARTSIG:1 Topical Every Night    . saccharomyces boulardii (FLORASTOR) 250 MG capsule Take 1 capsule (250 mg total) by mouth 2 (two) times daily. 180 capsule 3   No current facility-administered medications for this visit.   Facility-Administered Medications Ordered in Other Visits  Medication Dose Route Frequency Provider Last Rate Last Admin  . sodium chloride flush (NS) 0.9 % injection 10 mL  10 mL Intracatheter PRN Ladell Pier, MD   10 mL at 03/26/20 1610    PHYSICAL EXAMINATION: ECOG PERFORMANCE STATUS: 1 - Symptomatic but completely ambulatory  Vitals:   09/03/20 1504  BP: 125/82  Pulse: 80  Resp: 18  Temp: 98.5 F (36.9 C)  SpO2: 100%   Filed Weights   09/03/20 1504  Weight:  253 lb 14.4 oz (115.2 kg)    BREAST: No palpable masses or nodules in either right or left breasts. No palpable axillary supraclavicular or infraclavicular adenopathy no breast tenderness or nipple discharge. (exam performed in the presence of a chaperone)  LABORATORY DATA:  I have reviewed the data as listed CMP Latest Ref Rng & Units 07/18/2020 05/23/2020 03/26/2020  Glucose 70 - 99 mg/dL 114(H) 134(H) 90  BUN 6 - 20 mg/dL _0 Creatinine 0.44 - 1.00 mg/dL 0.80 0.83 0.79  Sodium 135 - 145 mmol/L 142 138 141  Potassium 3.5 - 5.1 mmol/L 3.6 3.5 3.7  Chloride 98 - 111 mmol/L 106 108 109  CO2 22 - 32 mmol/L _1 Calcium 8.9 - 10.3 mg/dL  8.8(L) 8.5(L) 8.9  Total Protein 6.5 - 8.1 g/dL 6.6 6.4(L) 6.8  Total Bilirubin 0.3 - 1.2 mg/dL 0.2(L) 0.4 0.3  Alkaline Phos 38 - 126 U/L 69 80 74  AST 15 - 41 U/L 17 13(L) 12(L)  ALT 0 - 44 U/L _2 Lab Results  Component Value Date   WBC 6.4 09/03/2020   HGB 11.9 (L) 09/03/2020   HCT 33.9 (L) 09/03/2020   MCV 94.7 09/03/2020   PLT 325 09/03/2020   NEUTROABS 3.6 09/03/2020    ASSESSMENT & PLAN:  Malignant neoplasm of upper-outer quadrant of right breast in female, estrogen receptor positive ([place]) 06/21/2019:Patient palpated a painful right breast lump x3 weeks. Mammogram showed a 2.0cm right breast mass at the 10 o'clock position, a 3.6cm enlarged right axillary lymph node, and a 0.5cm left breast mass. Biopsy showed, in the right breast and axilla(lymph node), IDC, grade 3, HER-2 equivocal by IHC, + by FISH, ER+ 40%, PR - 0%, Ki67 80%,  left breast, fibrocystic changes, no malignancy.Can be followed.  Treatment plan: 1. Neoadjuvant chemotherapy with TCH Perjeta 5cycles started 07/12/2019 completed 7/8/2021followed by HerceptinPerjeta completed 07/18/2020 2. breast conserving surgery with sentinel lymph node study: 12/13/2019: Pathologic complete response, 0/5 lymph nodes negative 3. Adjuvant radiation therapy10/26/2021- 03/12/2020 4.Followed by antiestrogen therapy with anastrozole 1 mg daily started 03/30/2020 ----------------------------------------------------------------------------------------------------------------------------------------------------------- Current treatment:  anastrozole with Zoladex Anastrozole toxicities:  Abdominal cramps: She was also baclofen periodically.  She gets 4-5 abdominal cramps per week. Hot flashes    Breast cancer surveillance:   Mammograms annually We will schedule monthly Zoladex injections  Return to clinic in 6 months for follow-up    No orders of the defined types were placed in this encounter.  The patient has a  good understanding of the overall plan. she agrees with it. she will call with any problems that may develop before the next visit here.  Total time spent: 20 mins including face to face time and time spent for planning, charting and coordination of care  Rulon Eisenmenger, MD, MPH 09/03/2020  I, Cloyde Reams Dorshimer, am acting as scribe for Dr. Nicholas Lose.  I have reviewed the above documentation for accuracy and completeness, and I agree with the above.

## 2020-09-03 ENCOUNTER — Inpatient Hospital Stay (HOSPITAL_BASED_OUTPATIENT_CLINIC_OR_DEPARTMENT_OTHER): Payer: BC Managed Care – PPO | Admitting: Hematology and Oncology

## 2020-09-03 ENCOUNTER — Inpatient Hospital Stay: Payer: BC Managed Care – PPO

## 2020-09-03 ENCOUNTER — Other Ambulatory Visit: Payer: Self-pay

## 2020-09-03 ENCOUNTER — Inpatient Hospital Stay: Payer: BC Managed Care – PPO | Attending: Hematology and Oncology

## 2020-09-03 VITALS — BP 125/82 | HR 80 | Temp 98.5°F | Resp 18

## 2020-09-03 DIAGNOSIS — Z9221 Personal history of antineoplastic chemotherapy: Secondary | ICD-10-CM | POA: Insufficient documentation

## 2020-09-03 DIAGNOSIS — Z79899 Other long term (current) drug therapy: Secondary | ICD-10-CM | POA: Diagnosis not present

## 2020-09-03 DIAGNOSIS — Z923 Personal history of irradiation: Secondary | ICD-10-CM | POA: Insufficient documentation

## 2020-09-03 DIAGNOSIS — Z5111 Encounter for antineoplastic chemotherapy: Secondary | ICD-10-CM | POA: Insufficient documentation

## 2020-09-03 DIAGNOSIS — Z170 Estrogen receptor positive status [ER+]: Secondary | ICD-10-CM | POA: Insufficient documentation

## 2020-09-03 DIAGNOSIS — Z95828 Presence of other vascular implants and grafts: Secondary | ICD-10-CM

## 2020-09-03 DIAGNOSIS — C50411 Malignant neoplasm of upper-outer quadrant of right female breast: Secondary | ICD-10-CM

## 2020-09-03 LAB — CMP (CANCER CENTER ONLY)
ALT: 14 U/L (ref 0–44)
AST: 18 U/L (ref 15–41)
Albumin: 3.4 g/dL — ABNORMAL LOW (ref 3.5–5.0)
Alkaline Phosphatase: 64 U/L (ref 38–126)
Anion gap: 8 (ref 5–15)
BUN: 13 mg/dL (ref 6–20)
CO2: 25 mmol/L (ref 22–32)
Calcium: 8.9 mg/dL (ref 8.9–10.3)
Chloride: 105 mmol/L (ref 98–111)
Creatinine: 0.79 mg/dL (ref 0.44–1.00)
GFR, Estimated: >60 mL/min (ref >60)
Glucose, Bld: 94 mg/dL (ref 70–99)
Potassium: 4 mmol/L (ref 3.5–5.1)
Sodium: 138 mmol/L (ref 135–145)
Total Bilirubin: 0.3 mg/dL (ref 0.3–1.2)
Total Protein: 6.7 g/dL (ref 6.5–8.1)

## 2020-09-03 LAB — CBC WITH DIFFERENTIAL (CANCER CENTER ONLY)
Abs Immature Granulocytes: 0.02 10*3/uL (ref 0.00–0.07)
Basophils Absolute: 0.1 10*3/uL (ref 0.0–0.1)
Basophils Relative: 1 %
Eosinophils Absolute: 0.2 10*3/uL (ref 0.0–0.5)
Eosinophils Relative: 3 %
HCT: 33.9 % — ABNORMAL LOW (ref 36.0–46.0)
Hemoglobin: 11.9 g/dL — ABNORMAL LOW (ref 12.0–15.0)
Immature Granulocytes: 0 %
Lymphocytes Relative: 29 %
Lymphs Abs: 1.9 10*3/uL (ref 0.7–4.0)
MCH: 33.2 pg (ref 26.0–34.0)
MCHC: 35.1 g/dL (ref 30.0–36.0)
MCV: 94.7 fL (ref 80.0–100.0)
Monocytes Absolute: 0.6 10*3/uL (ref 0.1–1.0)
Monocytes Relative: 10 %
Neutro Abs: 3.6 10*3/uL (ref 1.7–7.7)
Neutrophils Relative %: 57 %
Platelet Count: 325 10*3/uL (ref 150–400)
RBC: 3.58 MIL/uL — ABNORMAL LOW (ref 3.87–5.11)
RDW: 13.1 % (ref 11.5–15.5)
WBC Count: 6.4 10*3/uL (ref 4.0–10.5)
nRBC: 0 % (ref 0.0–0.2)

## 2020-09-03 MED ORDER — SODIUM CHLORIDE 0.9% FLUSH
10.0000 mL | Freq: Once | INTRAVENOUS | Status: AC
  Administered 2020-09-03: 10 mL
  Filled 2020-09-03: qty 10

## 2020-09-03 MED ORDER — GOSERELIN ACETATE 3.6 MG ~~LOC~~ IMPL
DRUG_IMPLANT | SUBCUTANEOUS | Status: AC
  Filled 2020-09-03: qty 3.6

## 2020-09-03 MED ORDER — HEPARIN SOD (PORK) LOCK FLUSH 100 UNIT/ML IV SOLN
500.0000 [IU] | Freq: Once | INTRAVENOUS | Status: AC
  Administered 2020-09-03: 500 [IU]
  Filled 2020-09-03: qty 5

## 2020-09-03 MED ORDER — BACLOFEN 10 MG PO TABS: 10.0000 mg | ORAL_TABLET | Freq: Three times a day (TID) | ORAL | 3 refills | Status: DC

## 2020-09-03 MED ORDER — GOSERELIN ACETATE 3.6 MG ~~LOC~~ IMPL
3.6000 mg | DRUG_IMPLANT | Freq: Once | SUBCUTANEOUS | Status: AC
  Administered 2020-09-03: 3.6 mg via SUBCUTANEOUS

## 2020-09-03 NOTE — Patient Instructions (Signed)

## 2020-09-03 NOTE — Assessment & Plan Note (Signed)
06/21/2019:Patient palpated a painful right breast lump x3 weeks. Mammogram showed a 2.0cm right breast mass at the 10 o'clock position, a 3.6cm enlarged right axillary lymph node, and a 0.5cm left breast mass. Biopsy showed, in the right breast and axilla(lymph node), IDC, grade 3, HER-2 equivocal by IHC, + by FISH, ER+ 40%, PR - 0%, Ki67 80%,  left breast, fibrocystic changes, no malignancy.Can be followed.  Treatment plan: 1. Neoadjuvant chemotherapy with TCH Perjeta 5cycles started 07/12/2019 completed 7/8/2021followed by HerceptinPerjeta completed 07/18/2020 2. breast conserving surgery with sentinel lymph node study: 12/13/2019: Pathologic complete response, 0/5 lymph nodes negative 3. Adjuvant radiation therapy10/26/2021- 03/12/2020 4.Followed by antiestrogen therapy with anastrozole 1 mg daily started 03/30/2020 ----------------------------------------------------------------------------------------------------------------------------------------------------------- Current treatment:  anastrozole Anastrozole toxicities:  Hot flashes: Gabapentin is helping.    Breast cancer surveillance: 1.  Mammograms will need to be arranged 2. breast exam annually  Return to clinic in 1 year for follow-up

## 2020-09-23 ENCOUNTER — Ambulatory Visit: Payer: BC Managed Care – PPO | Attending: Adult Health

## 2020-09-23 ENCOUNTER — Other Ambulatory Visit: Payer: Self-pay

## 2020-09-23 DIAGNOSIS — Z483 Aftercare following surgery for neoplasm: Secondary | ICD-10-CM | POA: Insufficient documentation

## 2020-09-23 NOTE — Therapy (Signed)
[place], [place], [address] Phone: [phone]   Fax:  [phone]  Physical Therapy Treatment  Patient Details  Name: Heidi Costa MRN: [id number] Date of Birth: [birth year] Referring Provider (PT): Thedore Mins   Encounter Date: 09/23/2020   PT End of Session - 09/23/20 1700     Visit Number 15    PT Start Time 6301    PT Stop Time 6010    PT Time Calculation (min) 11 min    Activity Tolerance Patient tolerated treatment well    Behavior During Therapy [facility] for tasks assessed/performed             Past Medical History:  Diagnosis Date   Abnormality of gait 08/02/2012   resolved per patient on 07/12/19   Anemia    iron taking in the past, not currently   Anxiety    Arthritis    spine- cerv.  & arm    Asthma    Bulging lumbar disc    Cancer ([place])    Right Breast-starts chemo on 07/14/19   Chronic arthralgias of knees and hips 08/02/2012   Depression    Pt. took self off Celexa one month ago, states she did n't like how it made her feel .   Family history of cancer    GERD (gastroesophageal reflux disease)    diet controlled   Heart murmur    pt. been told that this is a fact, no echo or cardiac surveillance in the past    HLD (hyperlipidemia)    no meds, diet controlled   Hypertension    Obesity    Spinal stenosis     Past Surgical History:  Procedure Laterality Date   ANTERIOR CERVICAL DECOMP/DISCECTOMY FUSION N/A 10/27/2012   Procedure: ANTERIOR CERVICAL DECOMPRESSION/DISCECTOMY FUSION 1 LEVEL;  Surgeon: Faythe Ghee, MD;  Location: [facility];  Service: Neurosurgery;  Laterality: N/A;  ANTERIOR CERVICAL DECOMPRESSION/DISCECTOMY FUSION 1 LEVEL   BREAST LUMPECTOMY WITH RADIOACTIVE SEED AND SENTINEL LYMPH NODE BIOPSY Right 12/13/2019   Procedure: RIGHT BREAST LUMPECTOMY WITH RADIOACTIVE SEED AND RIGHT AXILLARY  SENTINEL LYMPH NODE BIOPSY, RIGHT AXILLARY NODE SEED GUIDED  EXCISION, BLUE DYE INJECTION;  Surgeon: Rolm Bookbinder, MD;  Location: [facility];  Service: General;  Laterality: Right;  PEC BLOCK   CHALAZION EXCISION     L eye    PORTACATH PLACEMENT Left 07/13/2019   Procedure: INSERTION PORT-A-CATH WITH ULTRASOUND;  Surgeon: Rolm Bookbinder, MD;  Location: [employer];  Service: General;  Laterality: Left;   VAGINAL DELIVERY  2002   WISDOM TOOTH EXTRACTION      There were no vitals filed for this visit.   Subjective Assessment - 09/23/20 1652     Subjective Pt returned for 3 month L-Dex screen.    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 It measures 2.3 cm and is located in the upper outer quadrant. She has an axillary node that measures 3.7 cm and was biopsied and is positive. Her cancer is ER positive, PR negative and HER2 positive with a Ki67 of 80%. There is also a 5 mm area on her left breast which will be followed on MRI. 12/13/19- R lumpectomy and SLNB (1 nodes- negative)  She completed !05/14/2019                    L-DEX FLOWSHEETS - 09/23/20 1600  L-DEX LYMPHEDEMA SCREENING   Measurement Type Unilateral    L-DEX MEASUREMENT EXTREMITY Upper Extremity    POSITION  Standing    DOMINANT SIDE Right    At Risk Side Right    BASELINE SCORE (UNILATERAL) -1    L-DEX SCORE (UNILATERAL) -3.3    VALUE CHANGE (UNILAT) -2.3               Flowsheet Row Outpatient [facility]  Lymphedema Life Impact Scale Total Score 7.35 %                          PT Long Term Goals - 07/04/20 1617       PT LONG TERM GOAL #1   Title Patient will demonstrate she has regained full shoulder ROM of left shoulder without pain    Baseline achieved all except IR behind back    Period Weeks    Status Partially Met      PT LONG TERM GOAL #2   Title Pt will know how to manage  lymphedema in right breast with self manual lymph drainage, use of compression and exercise    Time 4    Period Weeks    Status Achieved      PT LONG TERM GOAL #3   Title Pt will be independent in a home exercise program for shoulder ROM, strength and general fitness    Baseline 100% better    Time 4    Period Weeks    Status Achieved      PT LONG TERM GOAL #4   Title Pt will maintain full ROM, and decreased cording as she continues radiation    Time 6    Period Weeks    Status Achieved                   Plan - 09/23/20 1700     Clinical Impression Statement Pt returns for her 3 month L-Dex screen. Her change from baseline of -2.3 is WNLs so no further treatment is required at this time except to cont every 3 month L-Dex screens which pt is agreeable to. Pt does report that she is having more success with the use of her Flexitouch in regards to her breast lymphedema. She has not ordered her compression sleeve and as of today her change from baseline is well WNLs so she does not need this at this time. Pt verbalized understanding.    PT Next Visit Plan Cont every 3 month L-Dex screens for up to 2 years from her SLNB.    Consulted and Agree with Plan of Care Patient             Patient will benefit from skilled therapeutic intervention in order to improve the following deficits and impairments:     Visit Diagnosis: Aftercare following surgery for neoplasm     Problem List Patient Active Problem List   Diagnosis Date Noted   Depression, major, single episode, moderate ([place]) 10/17/2019   Mild persistent asthma without complication 16/09/3708   Port-A-Cath in place 08/04/2019   Genetic testing 07/24/2019   Family history of cancer    Dysphagia 06/23/2019   Malignant neoplasm of upper-outer quadrant of right breast in female, estrogen receptor positive ([place]) 06/21/2019   Tobacco consumption 06/06/2019   Breast mass, right 06/06/2019   Sensation of change in body  temperature 06/06/2019   Neuropathic pain 06/06/2019   Bite, insect  10/19/2016   Swelling of eyelid, left 10/19/2016   Essential hypertension 08/10/2016   Disturbance of skin sensation 05/08/2013   Cervical spondylosis with myelopathy 05/08/2013   Abnormality of gait 08/02/2012   Chronic arthralgias of knees and hips 08/02/2012    Otelia Limes, PTA 09/23/2020, 5:05 PM  [place], [place], [address] Phone: [phone]   Fax:  [phone]  Name: Heidi Costa MRN: [id number] Date of Birth: 09-01-1980

## 2020-10-03 ENCOUNTER — Telehealth: Payer: Self-pay

## 2020-10-03 ENCOUNTER — Other Ambulatory Visit: Payer: Self-pay

## 2020-10-03 ENCOUNTER — Inpatient Hospital Stay: Payer: BC Managed Care – PPO | Attending: Hematology and Oncology

## 2020-10-03 VITALS — BP 155/98 | HR 92 | Temp 98.7°F | Resp 16

## 2020-10-03 DIAGNOSIS — Z170 Estrogen receptor positive status [ER+]: Secondary | ICD-10-CM

## 2020-10-03 DIAGNOSIS — Z79818 Long term (current) use of other agents affecting estrogen receptors and estrogen levels: Secondary | ICD-10-CM | POA: Diagnosis not present

## 2020-10-03 DIAGNOSIS — C50411 Malignant neoplasm of upper-outer quadrant of right female breast: Secondary | ICD-10-CM | POA: Diagnosis present

## 2020-10-03 DIAGNOSIS — Z95828 Presence of other vascular implants and grafts: Secondary | ICD-10-CM

## 2020-10-03 MED ORDER — LETROZOLE 2.5 MG PO TABS: 2.5000 mg | ORAL_TABLET | Freq: Every day | ORAL | 1 refills | Status: DC

## 2020-10-03 MED ORDER — GOSERELIN ACETATE 3.6 MG ~~LOC~~ IMPL
3.6000 mg | DRUG_IMPLANT | Freq: Once | SUBCUTANEOUS | Status: AC
  Administered 2020-10-03: 3.6 mg via SUBCUTANEOUS

## 2020-10-03 NOTE — Patient Instructions (Signed)
Goserelin injection What is this medication? GOSERELIN (GOE se rel in) is similar to a hormone found in the body. It lowers the amount of sex hormones that the body makes. Men will have lower testosterone levels and women will have lower estrogen levels while taking this medicine. In men, this medicine is used to treat prostate cancer; the injection is either given once per month or once every 12 weeks. A once per month injection (only) is used to treat women with endometriosis, dysfunctional uterine bleeding, or advanced breast cancer. This medicine may be used for other purposes; ask your health care provider or pharmacist if you have questions. COMMON BRAND NAME(S): Zoladex What should I tell my care team before I take this medication? They need to know if you have any of these conditions: bone problems diabetes heart disease history of irregular heartbeat an unusual or allergic reaction to goserelin, other medicines, foods, dyes, or preservatives pregnant or trying to get pregnant breast-feeding How should I use this medication? This medicine is for injection under the skin. It is given by a health care professional in a hospital or clinic setting. Talk to your pediatrician regarding the use of this medicine in children. Special care may be needed. Overdosage: If you think you have taken too much of this medicine contact a poison control center or emergency room at once. NOTE: This medicine is only for you. Do not share this medicine with others. What if I miss a dose? It is important not to miss your dose. Call your doctor or health care professional if you are unable to keep an appointment. What may interact with this medication? Do not take this medicine with any of the following medications: cisapride dronedarone pimozide thioridazine This medicine may also interact with the following medications: other medicines that prolong the QT interval (an abnormal heart rhythm) This list  may not describe all possible interactions. Give your health care provider a list of all the medicines, herbs, non-prescription drugs, or dietary supplements you use. Also tell them if you smoke, drink alcohol, or use illegal drugs. Some items may interact with your medicine. What should I watch for while using this medication? Visit your doctor or health care provider for regular checks on your progress. Your symptoms may appear to get worse during the first weeks of this therapy. Tell your doctor or healthcare provider if your symptoms do not start to get better or if they get worse after this time. Your bones may get weaker if you take this medicine for a long time. If you smoke or frequently drink alcohol you may increase your risk of bone loss. A family history of osteoporosis, chronic use of drugs for seizures (convulsions), or corticosteroids can also increase your risk of bone loss. Talk to your doctor about how to keep your bones strong. This medicine should stop regular monthly menstruation in women. Tell your doctor if you continue to menstruate. Women should not become pregnant while taking this medicine or for 12 weeks after stopping this medicine. Women should inform their doctor if they wish to become pregnant or think they might be pregnant. There is a potential for serious side effects to an unborn child. Talk to your health care professional or pharmacist for more information. Do not breast-feed an infant while taking this medicine. Men should inform their doctors if they wish to father a child. This medicine may lower sperm counts. Talk to your health care professional or pharmacist for more information. This medicine may   increase blood sugar. Ask your healthcare provider if changes in diet or medicines are needed if you have diabetes. What side effects may I notice from receiving this medication? Side effects that you should report to your doctor or health care professional as soon as  possible: allergic reactions like skin rash, itching or hives, swelling of the face, lips, or tongue bone pain breathing problems changes in vision chest pain feeling faint or lightheaded, falls fever, chills pain, swelling, warmth in the leg pain, tingling, numbness in the hands or feet signs and symptoms of high blood sugar such as being more thirsty or hungry or having to urinate more than normal. You may also feel very tired or have blurry vision signs and symptoms of low blood pressure like dizziness; feeling faint or lightheaded, falls; unusually weak or tired stomach pain swelling of the ankles, feet, hands trouble passing urine or change in the amount of urine unusually high or low blood pressure unusually weak or tired Side effects that usually do not require medical attention (report to your doctor or health care professional if they continue or are bothersome): change in sex drive or performance changes in breast size in both males and females changes in emotions or moods headache hot flashes irritation at site where injected loss of appetite skin problems like acne, dry skin vaginal dryness This list may not describe all possible side effects. Call your doctor for medical advice about side effects. You may report side effects to FDA at 1-800-FDA-1088. Where should I keep my medication? This drug is given in a hospital or clinic and will not be stored at home. NOTE: This sheet is a summary. It may not cover all possible information. If you have questions about this medicine, talk to your doctor, pharmacist, or health care provider.  2022 Elsevier/Gold Standard (2018-07-04 14:05:56)  

## 2020-10-03 NOTE — Telephone Encounter (Signed)
Patient called regarding medication, anastrozole.  Patient reports she has been off medication X 2 weeks.  Since stopping medication patient has had no stomach cramping/spasms.  Patient not having to use medication, Bentyl, since coming off anastrozole.  Patient calling to see if there is an alternative medication to try since cramping has subsided since being off anastrozole.    MD recommendations for Letrozole 2.5mg  daily.  RN notified patient, educated patient on side effects.  Pt understands to continue on Zoladex.  No further needs at this time.

## 2020-11-04 ENCOUNTER — Inpatient Hospital Stay: Payer: BC Managed Care – PPO

## 2020-11-11 ENCOUNTER — Inpatient Hospital Stay: Payer: BC Managed Care – PPO | Attending: Hematology and Oncology

## 2020-11-11 ENCOUNTER — Other Ambulatory Visit: Payer: Self-pay

## 2020-11-11 VITALS — BP 129/78 | HR 72 | Temp 98.4°F | Resp 16

## 2020-11-11 DIAGNOSIS — Z170 Estrogen receptor positive status [ER+]: Secondary | ICD-10-CM | POA: Insufficient documentation

## 2020-11-11 DIAGNOSIS — Z5111 Encounter for antineoplastic chemotherapy: Secondary | ICD-10-CM | POA: Insufficient documentation

## 2020-11-11 DIAGNOSIS — Z95828 Presence of other vascular implants and grafts: Secondary | ICD-10-CM

## 2020-11-11 DIAGNOSIS — C50411 Malignant neoplasm of upper-outer quadrant of right female breast: Secondary | ICD-10-CM | POA: Insufficient documentation

## 2020-11-11 MED ORDER — GOSERELIN ACETATE 3.6 MG ~~LOC~~ IMPL
3.6000 mg | DRUG_IMPLANT | Freq: Once | SUBCUTANEOUS | Status: AC
  Administered 2020-11-11: 3.6 mg via SUBCUTANEOUS
  Filled 2020-11-11: qty 3.6

## 2020-11-14 ENCOUNTER — Ambulatory Visit (INDEPENDENT_AMBULATORY_CARE_PROVIDER_SITE_OTHER): Payer: BC Managed Care – PPO | Admitting: Plastic Surgery

## 2020-11-14 ENCOUNTER — Other Ambulatory Visit: Payer: Self-pay

## 2020-11-14 ENCOUNTER — Encounter: Payer: Self-pay | Admitting: Plastic Surgery

## 2020-11-14 VITALS — BP 129/84 | HR 81 | Ht 64.0 in | Wt 253.0 lb

## 2020-11-14 DIAGNOSIS — M545 Low back pain, unspecified: Secondary | ICD-10-CM

## 2020-11-14 DIAGNOSIS — C50411 Malignant neoplasm of upper-outer quadrant of right female breast: Secondary | ICD-10-CM

## 2020-11-14 DIAGNOSIS — M4004 Postural kyphosis, thoracic region: Secondary | ICD-10-CM | POA: Diagnosis not present

## 2020-11-14 DIAGNOSIS — Z170 Estrogen receptor positive status [ER+]: Secondary | ICD-10-CM

## 2020-11-14 DIAGNOSIS — N62 Hypertrophy of breast: Secondary | ICD-10-CM | POA: Diagnosis not present

## 2020-11-14 DIAGNOSIS — M546 Pain in thoracic spine: Secondary | ICD-10-CM

## 2020-11-14 NOTE — Progress Notes (Signed)
Referring Provider Rolm Bookbinder, MD [facility] [place],  [place] [address]   CC:  Chief Complaint  Patient presents with   consult      Heidi Costa is an 40 y.o. female.  HPI: Patient presents to discuss breast reduction.  She has had years of back pain, neck pain and shoulder grooving due to her large breast.  She is tried over-the-counter medications, warm packs, cold packs and supportive bras with little relief.  She is currently a triple D and wants to be around a C cup.  She does have a personal history of breast cancer on the right side which was treated by lumpectomy and radiation.  Her breast surgeon was Dr. [name].  Radiation finished 7 or 8 months ago.  Chemotherapy finished in April and sounds like that was hormonal therapy.  Allergies  Allergen Reactions   Aspirin Rash    Outpatient Encounter Medications as of 11/14/2020  Medication Sig Note   letrozole (FEMARA) 2.5 MG tablet Take 1 tablet (2.5 mg total) by mouth daily.    acetaminophen (TYLENOL) 500 MG tablet Take 500-1,000 mg by mouth daily as needed for pain.  (Patient not taking: Reported on 11/14/2020)    albuterol (VENTOLIN HFA) 108 (90 Base) MCG/ACT inhaler Inhale 2 puffs into the lungs every 6 (six) hours as needed for wheezing or shortness of breath. (Patient not taking: Reported on 11/14/2020)    baclofen (LIORESAL) 10 MG tablet Take 1 tablet (10 mg total) by mouth 3 (three) times daily. (Patient not taking: Reported on 11/14/2020)    dicyclomine (BENTYL) 10 MG capsule TAKE 1 CAPSULE BY MOUTH THREE TIMES DAILY AS NEEDED FOR MUSCLE SPASM (CRAMPS  AND  SPASMS)    lidocaine-prilocaine (EMLA) cream SMARTSIG:1 Topical Every Night (Patient not taking: Reported on 11/14/2020)    saccharomyces boulardii (FLORASTOR) 250 MG capsule Take 1 capsule (250 mg total) by mouth 2 (two) times daily. (Patient not taking: Reported on 11/14/2020)    [DISCONTINUED] prochlorperazine (COMPAZINE) 10 MG tablet Take 1  tablet (10 mg total) by mouth every 6 (six) hours as needed (Nausea or vomiting). 07/13/2019: Has not started   No facility-administered encounter medications on file as of 11/14/2020.     Past Medical History:  Diagnosis Date   Abnormality of gait 08/02/2012   resolved per patient on 07/12/19   Anemia    iron taking in the past, not currently   Anxiety    Arthritis    spine- cerv.  & arm    Asthma    Bulging lumbar disc    Cancer ([place])    Right Breast-starts chemo on 07/14/19   Chronic arthralgias of knees and hips 08/02/2012   Depression    Pt. took self off Celexa one month ago, states she did n't like how it made her feel .   Family history of cancer    GERD (gastroesophageal reflux disease)    diet controlled   Heart murmur    pt. been told that this is a fact, no echo or cardiac surveillance in the past    HLD (hyperlipidemia)    no meds, diet controlled   Hypertension    Obesity    Spinal stenosis     Past Surgical History:  Procedure Laterality Date   ANTERIOR CERVICAL DECOMP/DISCECTOMY FUSION N/A 10/27/2012   Procedure: ANTERIOR CERVICAL DECOMPRESSION/DISCECTOMY FUSION 1 LEVEL;  Surgeon: Faythe Ghee, MD;  Location: [facility];  Service: Neurosurgery;  Laterality: N/A;  ANTERIOR CERVICAL DECOMPRESSION/DISCECTOMY FUSION 1 LEVEL   BREAST LUMPECTOMY WITH RADIOACTIVE SEED AND SENTINEL LYMPH NODE BIOPSY Right 12/13/2019   Procedure: RIGHT BREAST LUMPECTOMY WITH RADIOACTIVE SEED AND RIGHT AXILLARY  SENTINEL LYMPH NODE BIOPSY, RIGHT AXILLARY NODE SEED GUIDED EXCISION, BLUE DYE INJECTION;  Surgeon: Rolm Bookbinder, MD;  Location: [facility];  Service: General;  Laterality: Right;  PEC BLOCK   CHALAZION EXCISION     L eye    PORTACATH PLACEMENT Left 07/13/2019   Procedure: INSERTION PORT-A-CATH WITH ULTRASOUND;  Surgeon: Rolm Bookbinder, MD;  Location: [facility];  Service: General;  Laterality: Left;   VAGINAL DELIVERY  2002   WISDOM TOOTH EXTRACTION       Family History  Problem Relation Age of Onset   Osteoarthritis Mother    Hypertension Mother    Diabetes Mother    Hypertension Father    Gout Father    Stroke Father 15       embolic   Diabetes Half-Brother    Hypertension Half-Brother    Heart failure Half-Brother 20   Kidney disease Half-Brother    Diabetes Paternal Grandfather    Cancer Paternal Grandfather 66       breast cancer    Social History   Social History Narrative   Patient is married with one child.   Patient is right handed.   Patient has a Associate's degree.   Patient drinks 2-3 cups daily.     Review of Systems General: Denies fevers, chills, weight loss CV: Denies chest pain, shortness of breath, palpitations  Physical Exam Vitals with BMI 11/14/2020 11/11/2020 10/03/2020  Height '5\' 4"'$  - -  Weight 253 lbs - -  BMI [id number] - -  Systolic Q000111Q Q000111Q [id number]  Diastolic 84 78 98  Pulse 81 72 92    General:  No acute distress,  Alert and oriented, Non-Toxic, Normal speech and affect Breast: She has grade 3 ptosis.  Sternal notch to nipple is 36 cm on the right and 40 cm on the left.  Nipple to fold is 20 cm on the right and 22 cm on the left.  She has a periareolar scar on the right side from her lumpectomy and has some mild radiation fibrosis of the skin on that side.  Assessment/Plan The patient has bilateral symptomatic macromastia.  She is a good candidate for a breast reduction.  She is interested in pursuing surgical treatment.  She has tried supportive garments and fitted bras with no relief.  The details of breast reduction surgery were discussed.  I explained the procedure in detail along the with the expected scars.  The risks were discussed in detail and include bleeding, infection, damage to surrounding structures, need for additional procedures, nipple loss, change in nipple sensation, persistent pain, contour irregularities and asymmetries.  I explained that breast feeding is often not possible after  breast reduction surgery.  We discussed the expected postoperative course with an overall recovery period of about 1 month.  She demonstrated full understanding of all risks.  We discussed her personal risk factors that include history of radiation.  I explained the history of radiation would increase her chances of having wound healing complication on the right side.  Also I mention that she would need to quit smoking prior to surgery as the combination of those 2 risk factors would be too high.  She seemed fully understanding and interested in moving forward..  The patient is interested in pursuing surgical treatment.  I anticipate  approximately 1000g of tissue removed from each side.   Cindra Presume 11/14/2020, 1:15 PM

## 2020-12-05 ENCOUNTER — Other Ambulatory Visit: Payer: Self-pay

## 2020-12-05 ENCOUNTER — Inpatient Hospital Stay: Payer: BC Managed Care – PPO | Attending: Hematology and Oncology

## 2020-12-05 VITALS — BP 155/86 | HR 99 | Resp 18

## 2020-12-05 DIAGNOSIS — C50411 Malignant neoplasm of upper-outer quadrant of right female breast: Secondary | ICD-10-CM | POA: Diagnosis not present

## 2020-12-05 DIAGNOSIS — Z5111 Encounter for antineoplastic chemotherapy: Secondary | ICD-10-CM | POA: Diagnosis not present

## 2020-12-05 DIAGNOSIS — Z170 Estrogen receptor positive status [ER+]: Secondary | ICD-10-CM

## 2020-12-05 DIAGNOSIS — Z95828 Presence of other vascular implants and grafts: Secondary | ICD-10-CM

## 2020-12-05 MED ORDER — GOSERELIN ACETATE 3.6 MG ~~LOC~~ IMPL
3.6000 mg | DRUG_IMPLANT | Freq: Once | SUBCUTANEOUS | Status: AC
  Administered 2020-12-05: 3.6 mg via SUBCUTANEOUS
  Filled 2020-12-05: qty 3.6

## 2020-12-23 ENCOUNTER — Ambulatory Visit: Payer: Self-pay

## 2020-12-31 ENCOUNTER — Telehealth: Payer: Self-pay | Admitting: Plastic Surgery

## 2020-12-31 NOTE — Telephone Encounter (Signed)
Patient called to cancel her surgery. She has chosen another Psychologist, sport and exercise to go with. Surgery, pre and post appointments have been cancelled per patient request.

## 2021-01-03 ENCOUNTER — Other Ambulatory Visit: Payer: Self-pay

## 2021-01-03 ENCOUNTER — Inpatient Hospital Stay: Payer: BC Managed Care – PPO | Attending: Hematology and Oncology

## 2021-01-03 VITALS — BP 150/94 | HR 76 | Temp 98.2°F | Resp 16

## 2021-01-03 DIAGNOSIS — Z5111 Encounter for antineoplastic chemotherapy: Secondary | ICD-10-CM | POA: Diagnosis not present

## 2021-01-03 DIAGNOSIS — C50411 Malignant neoplasm of upper-outer quadrant of right female breast: Secondary | ICD-10-CM | POA: Insufficient documentation

## 2021-01-03 DIAGNOSIS — Z95828 Presence of other vascular implants and grafts: Secondary | ICD-10-CM

## 2021-01-03 DIAGNOSIS — Z170 Estrogen receptor positive status [ER+]: Secondary | ICD-10-CM | POA: Diagnosis not present

## 2021-01-03 MED ORDER — GOSERELIN ACETATE 3.6 MG ~~LOC~~ IMPL
3.6000 mg | DRUG_IMPLANT | Freq: Once | SUBCUTANEOUS | Status: AC
  Administered 2021-01-03: 3.6 mg via SUBCUTANEOUS
  Filled 2021-01-03: qty 3.6

## 2021-01-31 ENCOUNTER — Other Ambulatory Visit: Payer: Self-pay | Admitting: Hematology and Oncology

## 2021-02-03 ENCOUNTER — Inpatient Hospital Stay: Payer: BC Managed Care – PPO | Attending: Hematology and Oncology

## 2021-02-03 ENCOUNTER — Other Ambulatory Visit: Payer: Self-pay

## 2021-02-03 VITALS — BP 137/94 | HR 89 | Temp 98.4°F | Resp 20

## 2021-02-03 DIAGNOSIS — Z95828 Presence of other vascular implants and grafts: Secondary | ICD-10-CM

## 2021-02-03 DIAGNOSIS — C50411 Malignant neoplasm of upper-outer quadrant of right female breast: Secondary | ICD-10-CM | POA: Diagnosis present

## 2021-02-03 DIAGNOSIS — Z170 Estrogen receptor positive status [ER+]: Secondary | ICD-10-CM | POA: Diagnosis not present

## 2021-02-03 DIAGNOSIS — Z5111 Encounter for antineoplastic chemotherapy: Secondary | ICD-10-CM | POA: Diagnosis not present

## 2021-02-03 MED ORDER — GOSERELIN ACETATE 3.6 MG ~~LOC~~ IMPL
3.6000 mg | DRUG_IMPLANT | Freq: Once | SUBCUTANEOUS | Status: AC
  Administered 2021-02-03: 3.6 mg via SUBCUTANEOUS
  Filled 2021-02-03: qty 3.6

## 2021-02-12 ENCOUNTER — Encounter: Payer: Self-pay | Admitting: *Deleted

## 2021-02-12 NOTE — Progress Notes (Signed)
Received call from [facility] requesting medical clearance letter for pt to undergo bilateral breast reduction.  Per MD okay for pt to proceed with surgery.  Pt needing to stop Letrozole 1 weeks prior to surgery and resume 1 week post surgery.  Letter successfully faxed to [phone].

## 2021-02-19 ENCOUNTER — Encounter: Payer: Self-pay | Admitting: Nurse Practitioner

## 2021-02-19 ENCOUNTER — Ambulatory Visit (INDEPENDENT_AMBULATORY_CARE_PROVIDER_SITE_OTHER): Payer: BC Managed Care – PPO | Admitting: Nurse Practitioner

## 2021-02-19 ENCOUNTER — Encounter: Payer: BC Managed Care – PPO | Admitting: Surgical

## 2021-02-19 ENCOUNTER — Other Ambulatory Visit: Payer: Self-pay

## 2021-02-19 VITALS — BP 124/80 | HR 95 | Temp 98.8°F | Ht 65.4 in | Wt 259.8 lb

## 2021-02-19 DIAGNOSIS — Z0001 Encounter for general adult medical examination with abnormal findings: Secondary | ICD-10-CM | POA: Diagnosis not present

## 2021-02-19 DIAGNOSIS — I10 Essential (primary) hypertension: Secondary | ICD-10-CM | POA: Diagnosis not present

## 2021-02-19 DIAGNOSIS — E7800 Pure hypercholesterolemia, unspecified: Secondary | ICD-10-CM | POA: Diagnosis not present

## 2021-02-19 DIAGNOSIS — R0683 Snoring: Secondary | ICD-10-CM | POA: Diagnosis not present

## 2021-02-19 DIAGNOSIS — Z6841 Body Mass Index (BMI) 40.0 and over, adult: Secondary | ICD-10-CM

## 2021-02-19 DIAGNOSIS — Z1159 Encounter for screening for other viral diseases: Secondary | ICD-10-CM

## 2021-02-19 DIAGNOSIS — Z23 Encounter for immunization: Secondary | ICD-10-CM

## 2021-02-19 DIAGNOSIS — Z79899 Other long term (current) drug therapy: Secondary | ICD-10-CM

## 2021-02-19 DIAGNOSIS — Z7689 Persons encountering health services in other specified circumstances: Secondary | ICD-10-CM

## 2021-02-19 LAB — POCT URINALYSIS DIPSTICK
Bilirubin, UA: NEGATIVE
Glucose, UA: NEGATIVE
Ketones, UA: NEGATIVE
Leukocytes, UA: NEGATIVE
Nitrite, UA: NEGATIVE
Protein, UA: NEGATIVE
Spec Grav, UA: >=1.03 — AB (ref 1.010–1.025)
Urobilinogen, UA: 0.2 E.U./dL
pH, UA: 5 (ref 5.0–8.0)

## 2021-02-19 LAB — POCT UA - MICROALBUMIN
Albumin/Creatinine Ratio, Urine, POC: 30
Creatinine, POC: 300 mg/dL
Microalbumin Ur, POC: 10 mg/L

## 2021-02-19 LAB — LIPID PANEL
Chol/HDL Ratio: 5.1 ratio — ABNORMAL HIGH (ref 0.0–4.4)
Cholesterol, Total: 225 mg/dL — ABNORMAL HIGH (ref 100–199)
HDL: 44 mg/dL (ref >39)
LDL Chol Calc (NIH): 145 mg/dL — ABNORMAL HIGH (ref 0–99)
Triglycerides: 196 mg/dL — ABNORMAL HIGH (ref 0–149)
VLDL Cholesterol Cal: 36 mg/dL (ref 5–40)

## 2021-02-19 MED ORDER — AMLODIPINE BESYLATE 2.5 MG PO TABS
2.5000 mg | ORAL_TABLET | Freq: Every day | ORAL | 11 refills | Status: DC
Start: 2021-02-19 — End: 2021-03-24

## 2021-02-19 NOTE — Progress Notes (Addendum)
I,Katawbba Wiggins,acting as a Education administrator for [employer], FNP.,have documented all relevant documentation on the behalf of Minette Brine, FNP,as directed by  Minette Brine, FNP while in the presence of Minette Brine, [place].   This visit occurred during the SARS-CoV-2 public health emergency.  Safety protocols were in place, including screening questions prior to the visit, additional usage of staff PPE, and extensive cleaning of exam room while observing appropriate contact time as indicated for disinfecting solutions.  Subjective:     Patient ID: Heidi Costa , female    DOB: October 14, 1980 , 40 y.o.   MRN: [id number]   Chief Complaint  Patient presents with   Establish Care   Annual Exam     HPI  The patient is here to establish care and for a physical examination.  She was a patient at [facility], her last visit with them was October 2021.  She found the office online. 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 Married with one son who is 36 y/o - healthy. The patient is followed by Gyn - Dr. Dellis Filbert, last pap was 09/01/2019.    Dx with Breast cancer 07/14/2019 - had a lumpectomy. She is scheduled for breast reduction on December 6th. She is no longer smoking cigarettes.   Her last 3 visits at the cancer center blood pressure was elevated.  She reports a history of snoring and does not feel rested.    Past Medical History:  Diagnosis Date   Abnormality of gait 08/02/2012   resolved per patient on 07/12/19   Anemia    iron taking in the past, not currently   Anxiety    Arthritis    spine- cerv.  & arm    Asthma    Bulging lumbar disc    Cancer ([place])    Right Breast-starts chemo on 07/14/19   Chronic arthralgias of knees and hips 08/02/2012   Depression    Pt. took self off Celexa one month ago, states she did n't like how it made her feel .   Family history of cancer    GERD (gastroesophageal reflux disease)    diet controlled   Heart murmur     pt. been told that this is a fact, no echo or cardiac surveillance in the past    HLD (hyperlipidemia)    no meds, diet controlled   Hypertension    MRSA (methicillin resistant Staphylococcus aureus) 03/02/2005   Obesity    Spinal stenosis      Family History  Problem Relation Age of Onset   Osteoarthritis Mother    Hypertension Mother    Diabetes Mother    Hypertension Father    Gout Father    Stroke Father 45       embolic   Diabetes Half-Brother    Hypertension Half-Brother    Heart failure Half-Brother 64   Kidney disease Half-Brother    Diabetes Paternal Grandfather    Cancer Paternal Grandfather 24       breast cancer     Current Outpatient Medications:    acetaminophen (TYLENOL) 500 MG tablet, Take 500-1,000 mg by mouth daily as needed for pain., Disp: , Rfl:    albuterol (VENTOLIN HFA) 108 (90 Base) MCG/ACT inhaler, Inhale 2 puffs into the lungs every 6 (six) hours as needed for wheezing or shortness of breath., Disp: 18 g, Rfl: 2   baclofen (LIORESAL) 10 MG tablet, Take 1 tablet (10 mg total) by mouth 3 (three)  times daily., Disp: 90 each, Rfl: 3   atorvastatin (LIPITOR) 10 MG tablet, Take 1 tablet by mouth daily, Disp: 30 tablet, Rfl: 2   blood glucose meter kit and supplies KIT, Dispense based on patient and insurance preference. Use up to four times daily as directed. (FOR ICD-137). For QAC - HS accuchecks.  Dispense any approved glucometer with 1 month testing supplies., Disp: 1 each, Rfl: 1   buPROPion (WELLBUTRIN XL) 150 MG 24 hr tablet, Take 1 tablet (150 mg total) by mouth every morning., Disp: 30 tablet, Rfl: 2   carvedilol (COREG) 6.25 MG tablet, Take 1 tablet (6.25 mg total) by mouth 2 (two) times daily with a meal., Disp: 180 tablet, Rfl: 0   Continuous Blood Gluc Sensor (FREESTYLE LIBRE 2 SENSOR) MISC, USE AS DIRECTED TO  CHECK  BLOOD  SUGARS  5  TIMES  PER  DAY  AS  NEEDED, Disp: 2 each, Rfl: 0   insulin aspart (NOVOLOG) 100 UNIT/ML FlexPen, Before each  meal 3 times a day, 140-199 - 2 units, 200-250 - 6 units, 251-299 - 10 units,  300-349 - 12 units,  350 or above 14 units.  Insulin PEN if approved, provide syringes and needles if needed. (Patient not taking: Reported on 05/20/2021), Disp: 15 mL, Rfl: 0   insulin glargine (LANTUS SOLOSTAR) 100 UNIT/ML Solostar Pen, Inject 40 Units into the skin 2 (two) times daily., Disp: 15 mL, Rfl: 0   letrozole (FEMARA) 2.5 MG tablet, Take 1 tablet (2.5 mg total) by mouth daily., Disp: 90 tablet, Rfl: 3   lisinopril (ZESTRIL) 5 MG tablet, Take 1 tablet (5 mg total) by mouth daily., Disp: 30 tablet, Rfl: 0   sitaGLIPtin-metformin (JANUMET) 50-500 MG tablet, Take 1 tablet by mouth 2 (two) times daily with a meal., Disp: 90 tablet, Rfl: 1   Allergies  Allergen Reactions   Aspirin Rash      The patient states she uses none for birth control. No LMP recorded (lmp unknown).. Feels her last cycle was in June due to her chemo medications. Negative for Dysmenorrhea and Negative for Menorrhagia. Negative for: breast discharge, breast lump(s), breast pain and breast self exam. Associated symptoms include abnormal vaginal bleeding. Pertinent negatives include abnormal bleeding (hematology), anxiety, decreased libido, depression, difficulty falling sleep, dyspareunia, history of infertility, nocturia, sexual dysfunction, sleep disturbances, urinary incontinence, urinary urgency, vaginal discharge and vaginal itching. Diet regular; admits to eating poorly. The patient states her exercise level is none. Reports her weight is more than it has ever been.   The patient's tobacco use is:  Social History   Tobacco Use  Smoking Status Former   Packs/day: 1.00   Years: 10.00   Pack years: 10.00   Types: Cigarettes   Quit date: 06/24/2020   Years since quitting: 1.0  Smokeless Tobacco Never   She has been exposed to passive smoke. The patient's alcohol use is:  Social History   Substance and Sexual Activity  Alcohol Use  Yes   Comment: occas   Additional information: Last pap 08/2019, next one scheduled for 08/2022.    Review of Systems  Constitutional: Negative.   HENT: Negative.    Eyes: Negative.   Respiratory: Negative.    Cardiovascular: Negative.   Gastrointestinal: Negative.   Endocrine: Negative.   Genitourinary: Negative.   Musculoskeletal: Negative.   Skin: Negative.   Allergic/Immunologic: Negative.   Neurological: Negative.   Hematological: Negative.   Psychiatric/Behavioral: Negative.      Today's Vitals  02/19/21 0919  BP: 124/80  Pulse: 95  Temp: 98.8 F (37.1 C)  Weight: 259 lb 12.8 oz (117.8 kg)  Height: 5' 5.4" (1.661 m)   Body mass index is 42.71 kg/m.  Wt Readings from Last 3 Encounters:  05/20/21 237 lb (107.5 kg)  04/17/21 234 lb 6.4 oz (106.3 kg)  03/27/21 231 lb (104.8 kg)    BP Readings from Last 3 Encounters:  06/09/21 (!) 145/89  05/12/21 109/73  04/17/21 108/64    Objective:  Physical Exam Vitals reviewed.  Constitutional:      General: She is not in acute distress.    Appearance: Normal appearance. She is well-developed. She is obese.  HENT:     Head: Normocephalic and atraumatic.     Right Ear: Hearing, tympanic membrane, ear canal and external ear normal. There is no impacted cerumen.     Left Ear: Hearing, tympanic membrane, ear canal and external ear normal. There is no impacted cerumen.     Nose:     Comments: Deferred - masked    Mouth/Throat:     Comments: Deferred - masked Eyes:     General: Lids are normal.     Extraocular Movements: Extraocular movements intact.     Conjunctiva/sclera: Conjunctivae normal.     Pupils: Pupils are equal, round, and reactive to light.     Funduscopic exam:    Right eye: No papilledema.        Left eye: No papilledema.  Neck:     Thyroid: No thyroid mass.     Vascular: No carotid bruit.  Cardiovascular:     Rate and Rhythm: Normal rate and regular rhythm.     Pulses: Normal pulses.     Heart  sounds: Normal heart sounds. No murmur heard. Pulmonary:     Effort: Pulmonary effort is normal. No respiratory distress.     Breath sounds: Normal breath sounds. No wheezing.  Chest:     Chest wall: No mass.  Breasts:    Tanner Score is 5.     Right: Swelling present. No mass or tenderness.     Left: Normal. No mass or tenderness.  Abdominal:     General: Abdomen is flat. Bowel sounds are normal. There is no distension.     Palpations: Abdomen is soft.     Tenderness: There is no abdominal tenderness.  Genitourinary:    Rectum: Guaiac result negative.  Musculoskeletal:        General: No swelling. Normal range of motion.     Cervical back: Full passive range of motion without pain, normal range of motion and neck supple.     Right lower leg: No edema.     Left lower leg: No edema.  Lymphadenopathy:     Upper Body:     Right upper body: No supraclavicular, axillary or pectoral adenopathy.     Left upper body: No supraclavicular, axillary or pectoral adenopathy.  Skin:    General: Skin is warm and dry.     Capillary Refill: Capillary refill takes less than 2 seconds.  Neurological:     General: No focal deficit present.     Mental Status: She is alert and oriented to person, place, and time.     Cranial Nerves: No cranial nerve deficit.     Sensory: No sensory deficit.  Psychiatric:        Mood and Affect: Mood normal.        Behavior: Behavior normal.  Thought Content: Thought content normal.        Judgment: Judgment normal.        Assessment And Plan:     1. Essential hypertension Comments: Blood pressure was controlled - EKG 12-Lead - POCT Urinalysis Dipstick (81002) - POCT UA - Microalbumin  2. Elevated cholesterol - CMP14+EGFR - Lipid panel  3. Snoring Comments: Will refer for a sleep study - Ambulatory referral to Sleep Studies  4. Class 3 severe obesity due to excess calories with body mass index (BMI) of 40.0 to 44.9 in adult, unspecified whether  serious comorbidity present [facility]) She is encouraged to strive for BMI less than 30 to decrease cardiac risk. Advised to aim for at least 150 minutes of exercise per week.  - Hemoglobin A1c  5. Encounter for hepatitis C screening test for low risk patient Will check Hepatitis C screening due to recent recommendations to screen all adults 18 years and older - Hepatitis C antibody  6. Other long term (current) drug therapy - CBC  7. Need for vaccination Influenza vaccine administered Encouraged to take Tylenol as needed for fever or muscle aches. - Flu Vaccine QUAD 6+ mos PF IM (Fluarix Quad PF)  8. Encounter to establish care     Patient was given opportunity to ask questions. Patient verbalized understanding of the plan and was able to repeat key elements of the plan. All questions were answered to their satisfaction.   Minette Brine, FNP   I, Minette Brine, FNP, have reviewed all documentation for this visit. The documentation on 02/19/2021 for the exam, diagnosis, procedures, and orders are all accurate and complete.   THE PATIENT IS ENCOURAGED TO PRACTICE SOCIAL DISTANCING DUE TO THE COVID-19 PANDEMIC.

## 2021-02-19 NOTE — Patient Instructions (Signed)

## 2021-02-20 LAB — CBC
Hematocrit: 38.9 % (ref 34.0–46.6)
Hemoglobin: 13 g/dL (ref 11.1–15.9)
MCH: 31.6 pg (ref 26.6–33.0)
MCHC: 33.4 g/dL (ref 31.5–35.7)
MCV: 94 fL (ref 79–97)
Platelets: 360 10*3/uL (ref 150–450)
RBC: 4.12 x10E6/uL (ref 3.77–5.28)
RDW: 13.3 % (ref 11.7–15.4)
WBC: 4.6 10*3/uL (ref 3.4–10.8)

## 2021-02-20 LAB — CMP14+EGFR
ALT: 14 IU/L (ref 0–32)
AST: 14 IU/L (ref 0–40)
Albumin/Globulin Ratio: 2 (ref 1.2–2.2)
Albumin: 4.4 g/dL (ref 3.8–4.8)
Alkaline Phosphatase: 82 IU/L (ref 44–121)
BUN/Creatinine Ratio: 12 (ref 9–23)
BUN: 10 mg/dL (ref 6–24)
Bilirubin Total: 0.2 mg/dL (ref 0.0–1.2)
CO2: 24 mmol/L (ref 20–29)
Calcium: 9.5 mg/dL (ref 8.7–10.2)
Chloride: 102 mmol/L (ref 96–106)
Creatinine, Ser: 0.81 mg/dL (ref 0.57–1.00)
Globulin, Total: 2.2 g/dL (ref 1.5–4.5)
Glucose: 115 mg/dL — ABNORMAL HIGH (ref 70–99)
Potassium: 4.2 mmol/L (ref 3.5–5.2)
Sodium: 140 mmol/L (ref 134–144)
Total Protein: 6.6 g/dL (ref 6.0–8.5)
eGFR: 93 mL/min/{1.73_m2} (ref >59)

## 2021-02-20 LAB — HEMOGLOBIN A1C
Est. average glucose Bld gHb Est-mCnc: 157 mg/dL
Hgb A1c MFr Bld: 7.1 % — ABNORMAL HIGH (ref 4.8–5.6)

## 2021-02-20 LAB — HEPATITIS C ANTIBODY: Hep C Virus Ab: <0.1 s/co ratio (ref 0.0–0.9)

## 2021-02-21 ENCOUNTER — Encounter: Payer: Self-pay | Admitting: *Deleted

## 2021-02-21 NOTE — Progress Notes (Signed)
Received call from Dr. Audrea Muscat Contogiannis with [facility] [phone]) requesting to speak directly with MD regarding pt bilateral Mammary Reduction.  Informed Dr. Nathanial Rancher that MD is out of the office and will forward the message Monday morning.

## 2021-02-24 ENCOUNTER — Other Ambulatory Visit: Payer: Self-pay

## 2021-02-24 ENCOUNTER — Encounter (HOSPITAL_BASED_OUTPATIENT_CLINIC_OR_DEPARTMENT_OTHER): Payer: Self-pay | Admitting: Plastic Surgery

## 2021-02-24 ENCOUNTER — Telehealth: Payer: Self-pay | Admitting: Radiation Oncology

## 2021-02-24 NOTE — Telephone Encounter (Signed)
I received a message from [facility] Dr. Nathanial Rancher is considering reduction surgery.  I gave her clinic team my contact information if she wanted to talk to me about the patient's case since Dr. Lisbeth Renshaw was out of the office.  They called back and she was interested in speaking with Dr. Lisbeth Renshaw.  I offered to send her imaging about the patient's treatment course from her treatment planning software.  I copied  Dr. Lisbeth Renshaw as well so that they can discuss her case tomorrow.

## 2021-03-03 ENCOUNTER — Ambulatory Visit: Payer: Self-pay | Admitting: Plastic Surgery

## 2021-03-04 ENCOUNTER — Encounter (HOSPITAL_BASED_OUTPATIENT_CLINIC_OR_DEPARTMENT_OTHER): Payer: Self-pay | Admitting: Plastic Surgery

## 2021-03-04 ENCOUNTER — Other Ambulatory Visit: Payer: Self-pay

## 2021-03-04 ENCOUNTER — Ambulatory Visit (HOSPITAL_BASED_OUTPATIENT_CLINIC_OR_DEPARTMENT_OTHER)
Admission: RE | Admit: 2021-03-04 | Discharge: 2021-03-04 | Disposition: A | Discharge status: Home / Self Care | Payer: BC Managed Care – PPO | Attending: Plastic Surgery | Admitting: Plastic Surgery

## 2021-03-04 ENCOUNTER — Ambulatory Visit (HOSPITAL_BASED_OUTPATIENT_CLINIC_OR_DEPARTMENT_OTHER): Payer: BC Managed Care – PPO | Admitting: Certified Registered"

## 2021-03-04 ENCOUNTER — Encounter (HOSPITAL_BASED_OUTPATIENT_CLINIC_OR_DEPARTMENT_OTHER)
Admission: RE | Disposition: A | Discharge status: Home / Self Care | Payer: Self-pay | Source: Home / Self Care | Attending: Plastic Surgery

## 2021-03-04 DIAGNOSIS — Z87891 Personal history of nicotine dependence: Secondary | ICD-10-CM | POA: Diagnosis not present

## 2021-03-04 DIAGNOSIS — N62 Hypertrophy of breast: Secondary | ICD-10-CM | POA: Insufficient documentation

## 2021-03-04 DIAGNOSIS — Z923 Personal history of irradiation: Secondary | ICD-10-CM | POA: Diagnosis not present

## 2021-03-04 DIAGNOSIS — Z853 Personal history of malignant neoplasm of breast: Secondary | ICD-10-CM | POA: Insufficient documentation

## 2021-03-04 HISTORY — PX: BREAST REDUCTION SURGERY: SHX8

## 2021-03-04 SURGERY — MAMMOPLASTY, REDUCTION
Anesthesia: General | Laterality: Bilateral

## 2021-03-04 MED ORDER — DROPERIDOL 2.5 MG/ML IJ SOLN: 0.6250 mg | Freq: Once | INTRAMUSCULAR | Status: DC | PRN

## 2021-03-04 MED ORDER — SUGAMMADEX SODIUM 500 MG/5ML IV SOLN
INTRAVENOUS | Status: AC
  Filled 2021-03-04: qty 5

## 2021-03-04 MED ORDER — MIDAZOLAM HCL 5 MG/5ML IJ SOLN
INTRAMUSCULAR | Status: DC | PRN
  Administered 2021-03-04: 2 mg via INTRAVENOUS

## 2021-03-04 MED ORDER — ESMOLOL HCL 100 MG/10ML IV SOLN
INTRAVENOUS | Status: AC
  Filled 2021-03-04: qty 10

## 2021-03-04 MED ORDER — TRANEXAMIC ACID-NACL 1000-0.7 MG/100ML-% IV SOLN
INTRAVENOUS | Status: DC | PRN
  Administered 2021-03-04: 1000 mg via INTRAVENOUS

## 2021-03-04 MED ORDER — ROCURONIUM BROMIDE 100 MG/10ML IV SOLN
INTRAVENOUS | Status: DC | PRN
  Administered 2021-03-04: 20 mg via INTRAVENOUS
  Administered 2021-03-04: 70 mg via INTRAVENOUS

## 2021-03-04 MED ORDER — OXYCODONE HCL 5 MG PO TABS
ORAL_TABLET | ORAL | Status: AC
  Filled 2021-03-04: qty 1

## 2021-03-04 MED ORDER — FENTANYL CITRATE (PF) 100 MCG/2ML IJ SOLN
INTRAMUSCULAR | Status: AC
  Filled 2021-03-04: qty 2

## 2021-03-04 MED ORDER — ONDANSETRON HCL 4 MG/2ML IJ SOLN
INTRAMUSCULAR | Status: DC | PRN
  Administered 2021-03-04: 4 mg via INTRAVENOUS

## 2021-03-04 MED ORDER — FENTANYL CITRATE (PF) 100 MCG/2ML IJ SOLN
25.0000 ug | INTRAMUSCULAR | Status: DC | PRN
  Administered 2021-03-04 (×3): 50 ug via INTRAVENOUS

## 2021-03-04 MED ORDER — DEXMEDETOMIDINE (PRECEDEX) IN NS 20 MCG/5ML (4 MCG/ML) IV SYRINGE
PREFILLED_SYRINGE | INTRAVENOUS | Status: AC
  Filled 2021-03-04: qty 5

## 2021-03-04 MED ORDER — BUPIVACAINE-EPINEPHRINE (PF) 0.25% -1:200000 IJ SOLN
INTRAMUSCULAR | Status: AC
  Filled 2021-03-04: qty 30

## 2021-03-04 MED ORDER — LIDOCAINE HCL (CARDIAC) PF 100 MG/5ML IV SOSY
PREFILLED_SYRINGE | INTRAVENOUS | Status: DC | PRN
  Administered 2021-03-04: 60 mg via INTRAVENOUS

## 2021-03-04 MED ORDER — ROCURONIUM BROMIDE 10 MG/ML (PF) SYRINGE
PREFILLED_SYRINGE | INTRAVENOUS | Status: AC
  Filled 2021-03-04: qty 20

## 2021-03-04 MED ORDER — CEFAZOLIN SODIUM-DEXTROSE 2-4 GM/100ML-% IV SOLN
INTRAVENOUS | Status: AC
  Filled 2021-03-04: qty 100

## 2021-03-04 MED ORDER — SUGAMMADEX SODIUM 200 MG/2ML IV SOLN
INTRAVENOUS | Status: DC | PRN
  Administered 2021-03-04: 200 mg via INTRAVENOUS

## 2021-03-04 MED ORDER — LIDOCAINE-EPINEPHRINE (PF) 1 %-1:200000 IJ SOLN
INTRAMUSCULAR | Status: AC
  Filled 2021-03-04: qty 60

## 2021-03-04 MED ORDER — MIDAZOLAM HCL 2 MG/2ML IJ SOLN
INTRAMUSCULAR | Status: AC
  Filled 2021-03-04: qty 2

## 2021-03-04 MED ORDER — LIDOCAINE HCL (PF) 1 % IJ SOLN
INTRAMUSCULAR | Status: AC
  Filled 2021-03-04: qty 30

## 2021-03-04 MED ORDER — CHLORHEXIDINE GLUCONATE CLOTH 2 % EX PADS: 6.0000 | MEDICATED_PAD | Freq: Once | CUTANEOUS | Status: DC

## 2021-03-04 MED ORDER — CEFAZOLIN SODIUM-DEXTROSE 2-4 GM/100ML-% IV SOLN
2.0000 g | INTRAVENOUS | Status: AC
  Administered 2021-03-04: 2 g via INTRAVENOUS

## 2021-03-04 MED ORDER — OXYCODONE HCL 5 MG/5ML PO SOLN: 5.0000 mg | Freq: Once | ORAL | Status: DC | PRN

## 2021-03-04 MED ORDER — BACITRACIN ZINC 500 UNIT/GM EX OINT
TOPICAL_OINTMENT | CUTANEOUS | Status: AC
  Filled 2021-03-04: qty 28.35

## 2021-03-04 MED ORDER — SODIUM CHLORIDE 0.9 % IV SOLN
INTRAVENOUS | Status: DC | PRN
  Administered 2021-03-04 (×2): 70 mL

## 2021-03-04 MED ORDER — BUPIVACAINE HCL (PF) 0.25 % IJ SOLN
INTRAMUSCULAR | Status: AC
  Filled 2021-03-04: qty 30

## 2021-03-04 MED ORDER — SODIUM CHLORIDE (PF) 0.9 % IJ SOLN
INTRAMUSCULAR | Status: DC | PRN
  Administered 2021-03-04: 60 mL

## 2021-03-04 MED ORDER — ACETAMINOPHEN 500 MG PO TABS
1000.0000 mg | ORAL_TABLET | Freq: Once | ORAL | Status: AC
  Administered 2021-03-04: 1000 mg via ORAL

## 2021-03-04 MED ORDER — PROPOFOL 500 MG/50ML IV EMUL
INTRAVENOUS | Status: DC | PRN
  Administered 2021-03-04: 25 ug/kg/min via INTRAVENOUS

## 2021-03-04 MED ORDER — PROPOFOL 500 MG/50ML IV EMUL
INTRAVENOUS | Status: AC
  Filled 2021-03-04: qty 100

## 2021-03-04 MED ORDER — BUPIVACAINE-EPINEPHRINE (PF) 0.5% -1:200000 IJ SOLN
INTRAMUSCULAR | Status: AC
  Filled 2021-03-04: qty 30

## 2021-03-04 MED ORDER — OXYCODONE HCL 5 MG PO TABS: 5.0000 mg | ORAL_TABLET | Freq: Once | ORAL | Status: DC | PRN

## 2021-03-04 MED ORDER — SODIUM CHLORIDE 0.9 % IV SOLN: INTRAVENOUS | Status: DC | PRN

## 2021-03-04 MED ORDER — PROMETHAZINE HCL 25 MG/ML IJ SOLN: 6.2500 mg | INTRAMUSCULAR | Status: DC | PRN

## 2021-03-04 MED ORDER — DEXAMETHASONE SODIUM PHOSPHATE 4 MG/ML IJ SOLN
INTRAMUSCULAR | Status: DC | PRN
  Administered 2021-03-04: 10 mg via INTRAVENOUS

## 2021-03-04 MED ORDER — FENTANYL CITRATE (PF) 100 MCG/2ML IJ SOLN
INTRAMUSCULAR | Status: DC | PRN
  Administered 2021-03-04: 50 ug via INTRAVENOUS
  Administered 2021-03-04: 100 ug via INTRAVENOUS
  Administered 2021-03-04 (×4): 50 ug via INTRAVENOUS
  Administered 2021-03-04: 25 ug via INTRAVENOUS
  Administered 2021-03-04: 50 ug via INTRAVENOUS

## 2021-03-04 MED ORDER — LIDOCAINE-EPINEPHRINE 1 %-1:100000 IJ SOLN
INTRAMUSCULAR | Status: AC
  Filled 2021-03-04: qty 1

## 2021-03-04 MED ORDER — 0.9 % SODIUM CHLORIDE (POUR BTL) OPTIME
TOPICAL | Status: DC | PRN
  Administered 2021-03-04: 2000 mL

## 2021-03-04 MED ORDER — SCOPOLAMINE 1 MG/3DAYS TD PT72
MEDICATED_PATCH | TRANSDERMAL | Status: AC
  Filled 2021-03-04: qty 1

## 2021-03-04 MED ORDER — BUPIVACAINE LIPOSOME 1.3 % IJ SUSP
INTRAMUSCULAR | Status: AC
  Filled 2021-03-04: qty 20

## 2021-03-04 MED ORDER — BACITRACIN ZINC 500 UNIT/GM EX OINT
TOPICAL_OINTMENT | CUTANEOUS | Status: DC | PRN
  Administered 2021-03-04: 1 via TOPICAL

## 2021-03-04 MED ORDER — SCOPOLAMINE 1 MG/3DAYS TD PT72
1.0000 | MEDICATED_PATCH | TRANSDERMAL | Status: DC
  Administered 2021-03-04: 1.5 mg via TRANSDERMAL

## 2021-03-04 MED ORDER — LACTATED RINGERS IV SOLN: INTRAVENOUS | Status: DC

## 2021-03-04 MED ORDER — ACETAMINOPHEN 500 MG PO TABS
ORAL_TABLET | ORAL | Status: AC
  Filled 2021-03-04: qty 2

## 2021-03-04 MED ORDER — PROPOFOL 10 MG/ML IV BOLUS
INTRAVENOUS | Status: DC | PRN
  Administered 2021-03-04: 150 mg via INTRAVENOUS

## 2021-03-04 SURGICAL SUPPLY — 56 items
BAG DECANTER FOR FLEXI CONT (MISCELLANEOUS) ×2 IMPLANT
BLADE KNIFE PERSONA 10 (BLADE) ×12 IMPLANT
BLADE KNIFE PERSONA 15 (BLADE) ×6 IMPLANT
BNDG GAUZE ELAST 4 BULKY (GAUZE/BANDAGES/DRESSINGS) ×4 IMPLANT
CANISTER SUCT 1200ML W/VALVE (MISCELLANEOUS) ×2 IMPLANT
CAP BOUFFANT 24 BLUE NURSES (PROTECTIVE WEAR) ×2 IMPLANT
COVER BACK TABLE 60X90IN (DRAPES) ×2 IMPLANT
COVER MAYO STAND STRL (DRAPES) ×2 IMPLANT
DECANTER SPIKE VIAL GLASS SM (MISCELLANEOUS) ×4 IMPLANT
DRAIN CHANNEL 10F 3/8 F FF (DRAIN) ×4 IMPLANT
DRAPE U-SHAPE 76X120 STRL (DRAPES) ×4 IMPLANT
DRSG EMULSION OIL 3X3 NADH (GAUZE/BANDAGES/DRESSINGS) ×4 IMPLANT
DRSG PAD ABDOMINAL 8X10 ST (GAUZE/BANDAGES/DRESSINGS) ×4 IMPLANT
ELECT BLADE 4.0 EZ CLEAN MEGAD (MISCELLANEOUS) ×4
ELECT REM PT RETURN 9FT ADLT (ELECTROSURGICAL) ×2
ELECTRODE BLDE 4.0 EZ CLN MEGD (MISCELLANEOUS) ×2 IMPLANT
ELECTRODE REM PT RTRN 9FT ADLT (ELECTROSURGICAL) ×1 IMPLANT
EVACUATOR SILICONE 100CC (DRAIN) ×4 IMPLANT
GAUZE SPONGE 4X4 12PLY STRL (GAUZE/BANDAGES/DRESSINGS) ×4 IMPLANT
GLOVE SURG ENC MOIS LTX SZ7 (GLOVE) ×4 IMPLANT
GLOVE SURG UNDER POLY LF SZ7 (GLOVE) ×6 IMPLANT
GOWN STRL REUS W/ TWL LRG LVL3 (GOWN DISPOSABLE) ×2 IMPLANT
GOWN STRL REUS W/TWL LRG LVL3 (GOWN DISPOSABLE) ×4
NDL SAFETY ECLIPSE 18X1.5 (NEEDLE) ×1 IMPLANT
NEEDLE HYPO 18GX1.5 SHARP (NEEDLE) ×2
NEEDLE HYPO 25X1 1.5 SAFETY (NEEDLE) ×6 IMPLANT
NEEDLE SPNL 18GX3.5 QUINCKE PK (NEEDLE) ×2 IMPLANT
NS IRRIG 1000ML POUR BTL (IV SOLUTION) ×4 IMPLANT
PACK BASIN DAY SURGERY FS (CUSTOM PROCEDURE TRAY) ×2 IMPLANT
PAD FOAM SILICONE BACKED (GAUZE/BANDAGES/DRESSINGS) ×2 IMPLANT
PENCIL SMOKE EVACUATOR (MISCELLANEOUS) ×2 IMPLANT
PIN SAFETY STERILE (MISCELLANEOUS) ×2 IMPLANT
SCRUB TECHNI CARE 4 OZ NO DYE (MISCELLANEOUS) ×2 IMPLANT
SLEEVE SCD COMPRESS KNEE MED (STOCKING) ×2 IMPLANT
SPONGE T-LAP 18X18 ~~LOC~~+RFID (SPONGE) ×14 IMPLANT
STAPLER VISISTAT 35W (STAPLE) ×2 IMPLANT
STRIP CLOSURE SKIN 1/2X4 (GAUZE/BANDAGES/DRESSINGS) ×8 IMPLANT
SUT ETHILON 3 0 PS 1 (SUTURE) ×2 IMPLANT
SUT MNCRL AB 3-0 PS2 18 (SUTURE) ×10 IMPLANT
SUT MNCRL AB 4-0 PS2 18 (SUTURE) ×6 IMPLANT
SUT MON AB 5-0 PS2 18 (SUTURE) ×4 IMPLANT
SUT PROLENE 2 0 CT2 30 (SUTURE) ×2 IMPLANT
SUT PROLENE 3 0 PS 1 (SUTURE) ×4 IMPLANT
SUT VLOC 90 P-14 23 (SUTURE) ×4 IMPLANT
SYR 20ML LL LF (SYRINGE) ×2 IMPLANT
SYR BULB IRRIG 60ML STRL (SYRINGE) ×4 IMPLANT
SYR CONTROL 10ML LL (SYRINGE) ×4 IMPLANT
TAPE MEASURE VINYL STERILE (MISCELLANEOUS) ×2 IMPLANT
TOWEL GREEN STERILE FF (TOWEL DISPOSABLE) ×6 IMPLANT
TRAY DSU PREP LF (CUSTOM PROCEDURE TRAY) ×2 IMPLANT
TRAY FOL W/BAG SLVR 16FR STRL (SET/KITS/TRAYS/PACK) IMPLANT
TRAY FOLEY W/BAG SLVR 14FR LF (SET/KITS/TRAYS/PACK) ×2 IMPLANT
TRAY FOLEY W/BAG SLVR 16FR LF (SET/KITS/TRAYS/PACK)
TUBE CONNECTING 20X1/4 (TUBING) ×2 IMPLANT
UNDERPAD 30X36 HEAVY ABSORB (UNDERPADS AND DIAPERS) ×4 IMPLANT
YANKAUER SUCT BULB TIP NO VENT (SUCTIONS) ×4 IMPLANT

## 2021-03-04 NOTE — H&P (Signed)
  H&P faxed to surgical center.  -History and Physical Reviewed  -Patient has been re-examined  -No change in the plan of care  Heidi Costa A    

## 2021-03-04 NOTE — Op Note (Signed)
OPERATIVE REPORT  03/04/2021  Heidi Costa  PREOPERATIVE DIAGNOSIS:  1) Bilateral Macromastia 2) H/O Right Breast cancer S/P Lumpectomy with Radiation Therapy  POSTOPERATIVE DIAGNOSIS: 1) Bilateral Macromastia. 2) H/O Right Breast cancer with Lumpectomy and Radiation Therapy  PROCEDURE:  Bilateral Reduction Mammoplasties.  ATTENDING SURGEON:  Youlanda Roys, MD  ASSISTANT: Neomia Glass, RNFA  ANESTHESIA:  General.  ANESTHESIOLOGIST:  Salvatore Marvel, MD  COMPLICATIONS:  None.  INDICATIONS FOR THE PROCEDURE:  The patient is a 40 y.o. female who has bilateral macromastia that is clinically symptomatic.  The patient also has a H/O Righjt Breast Cancer that was treated with a Lumpectomy, Chemotherapy and Radiation Therapy.The patient has been cleared by her Oncologist and Radion Therapist to undergo Bilateral Reduction Mammaplasties. She presents to undergo Bilateral Reduction Mammoplasties.  DESCRIPTION OF PROCEDURE:  The patient was marked in preop holding area in a pattern of Wise for the future bilateral reduction mammoplasties. She was then taken back to the OR, placed on the table in supine position.  After adequate general anesthesia was obtained, the patient's chest was prepped with Techni-Care and draped in sterile fashion.  The bases of the breasts have been infiltrated with 1% lidocaine with epinephrine.  After adequate hemostasis and anesthesia taken effect, the procedure was begun.  Both of the breast reductions were performed in the following similar manner.  The nipple-areolar complex was marked with a 45-mm nipple marker.  The skin was then incised and deepithelialized around the nipple-areolar complex down to the inframammary crease in the inferior pedicle pattern.  Next, the medial, superior, and lateral skin flaps were elevated down to the chest wall.  Excess fat and glandular tissue removed from the inferior pedicle.  The nipple-areolar  complex was examined and found to be pink and viable.  The wound was irrigated with saline irrigation.  Meticulous hemostasis was obtained with the Bovie electrocautery.  Inferior pedicle was centralized using 3-0 Prolene suture.  A #10 JP flat fully fluted drain was placed into the wound. The skin flaps were brought together at the inverted T junction with a 2- 0 Prolene suture.  The incisions were stapled for temporary closure. The breasts were compared and found to have good shape and symmetry but the right breast had residual effects from the Radiation Therapy and had a different shape compared to the left breast. I had discussed this possibility with the patient pre-operatively due to the radiation therapy changes and she accepted the possible outcome. The patient also understand that her right breast may continue to have changes consistent with contraction due to the Radiation Therapy treatment and she accepted this risk as well.  The incisions were then closed from the medial aspect of the JP drain to the medial aspect of the [facility] incision by first placing a few 3-0 Monocryl sutures to tack together the dermal layer, and then both the dermal and cuticular layer were closed in a single layer using a 3-0 V-Lock barbed suture.  Lateral to the JP drain incision was closed using 3-0 Monocryl in the dermal layer, followed by 3-0 Monocryl running intracuticular stitch on the skin.  The vertical limb of the Wise pattern was closed in the dermal layer using 3-0 Monocryl suture.  The patient was placed in the upright position.  The future location of the nipple-areolar complexes was marked on both breast mounds using the 45-mm nipple marker.  She was then placed back in the recumbent position.  Both of the nipple areolar complexes  were brought out onto the breast mounds in the following similar manner.  The skin was incised as marked and removed in full thickness into the subcutaneous tissues.   The nipple- areolar complex was examined, found to be pink and viable, then brought out through this aperture and sewn in place using 4-0 Monocryl in the dermal layer, followed by 5-0 Monocryl running intracuticular stitch on the skin in the left breast and a 4-0 Monocryl suture in the right breast.  This 5-0 & 4-0 Monocryl suture was then brought down to close the cuticular layer of the vertical limb as well.  The JP drain was sewn in place using 3-0 nylon suture.  The pectoralis major muscle and fascia along with the breast and chest soft tissues were then infiltrated with 1% Exparel (total 266 mg).  Now the [facility] incision was also infiltrated with the Exparel in order to give the patient postoperative pain control.  The incisions were dressed with Steri-Strips, and the nipples dressed with bacitracin ointment and Adaptic.  4x4s were placed over the incisions and ABD pads in the axillary areas.  The patient was placed into a light postoperative support bra.  There were no complications. The patient tolerated the procedure well.  The final needle, sponge counts were reported to be correct at the end of the case.  The patient was then recovered without complications.  Both the patient and her family were given proper postoperative wound care instructions. She was then discharged home in the care of her family in stable condition.  Follow up will be with me in a few days in the office.         Youlanda Roys, M.D.  03/04/2021 1:19 PM

## 2021-03-04 NOTE — Discharge Instructions (Addendum)
1. No lifting greater than 5 lbs with arms for 4 weeks. 2. Empty, strip, record and reactivate JP drains 3 times a day. 3. Percocet 5/325 mg tabs 1-2 tabs po q 4-6 hours prn pain- prescription given in office. 4. Duricef 1 tab po bid- prescription given in office. 5. Sterapred dose pack as directed- prescription given in office. 6. Follow-up appointment Friday in office.    Next dose of Tylenol after 12:45pm as needed for pain.   Post Anesthesia Home Care Instructions  Activity: Get plenty of rest for the remainder of the day. A responsible individual must stay with you for 24 hours following the procedure.  For the next 24 hours, DO NOT: -Drive a car -Paediatric nurse -Drink alcoholic beverages -Take any medication unless instructed by your physician -Make any legal decisions or sign important papers.  Meals: Start with liquid foods such as gelatin or soup. Progress to regular foods as tolerated. Avoid greasy, spicy, heavy foods. If nausea and/or vomiting occur, drink only clear liquids until the nausea and/or vomiting subsides. Call your physician if vomiting continues.  Special Instructions/Symptoms: Your throat may feel dry or sore from the anesthesia or the breathing tube placed in your throat during surgery. If this causes discomfort, gargle with warm salt water. The discomfort should disappear within 24 hours.  If you had a scopolamine patch placed behind your ear for the management of post- operative nausea and/or vomiting:  1. The medication in the patch is effective for 72 hours, after which it should be removed.  Wrap patch in a tissue and discard in the trash. Wash hands thoroughly with soap and water. 2. You may remove the patch earlier than 72 hours if you experience unpleasant side effects which may include dry mouth, dizziness or visual disturbances. 3. Avoid touching the patch. Wash your hands with soap and water after contact with the patch.

## 2021-03-04 NOTE — Anesthesia Preprocedure Evaluation (Signed)
Anesthesia Evaluation  Patient identified by MRN, date of birth, ID band  Reviewed: Allergy & Precautions, NPO status , Patient's Chart, lab work & pertinent test results  Airway Mallampati: III  TM Distance: >3 FB Neck ROM: Full    Dental no notable dental hx.    Pulmonary asthma , former smoker,    Pulmonary exam normal breath sounds clear to auscultation       Cardiovascular Exercise Tolerance: Good hypertension, Normal cardiovascular exam Rhythm:Regular Rate:Normal     Neuro/Psych PSYCHIATRIC DISORDERS Anxiety Depression    GI/Hepatic Neg liver ROS, GERD  ,  Endo/Other  Morbid obesityhyperlipidemia  Renal/GU negative Renal ROS     Musculoskeletal  (+) Arthritis , Osteoarthritis,    Abdominal   Peds  Hematology  (+) anemia ,   Anesthesia Other Findings   Reproductive/Obstetrics                             Anesthesia Physical Anesthesia Plan  ASA: 3  Anesthesia Plan: General   Post-op Pain Management: Minimal or no pain anticipated   Induction: Intravenous  PONV Risk Score and Plan: 3 and Treatment may vary due to age or medical condition  Airway Management Planned: LMA  Additional Equipment: None  Intra-op Plan:   Post-operative Plan: Extubation in OR  Informed Consent: I have reviewed the patients History and Physical, chart, labs and discussed the procedure including the risks, benefits and alternatives for the proposed anesthesia with the patient or authorized representative who has indicated his/her understanding and acceptance.     Dental advisory given  Plan Discussed with: CRNA, Anesthesiologist and Surgeon  Anesthesia Plan Comments:         Anesthesia Quick Evaluation

## 2021-03-04 NOTE — Brief Op Note (Signed)
03/04/2021  1:30 PM  PATIENT:  Sharene Butters  40 y.o. female  PRE-OPERATIVE DIAGNOSIS:  1) BILATERAL MACROMASTIA AND 2) HISTORY OF RIGHT BREAST CANCER WITH RADIATION THERAPY  POST-OPERATIVE DIAGNOSIS:  1) BILATERAL MACROMASTIA AND 2) HISTORY OF RIGHT BREAST CANCER WITH RADIATION THERAPY  PROCEDURE:  Procedure(s): MAMMARY REDUCTION  (BREAST) (Bilateral)  SURGEON:  Surgeon(s) and Role:    * Layah Skousen, Audrea Muscat, MD - Primary  ASSISTANTS: Marge Abanto-Walston, RNFA  ANESTHESIA:   general  EBL:  150 mL   BLOOD ADMINISTERED:none  DRAINS: (81F) Jackson-Pratt drain(s) with closed bulb suction in the Bilateral Breasts    LOCAL MEDICATIONS USED: 1.3% Exparel (total 266 mgs.)  SPECIMEN:  Bilateral Breasts  DISPOSITION OF SPECIMEN:  PATHOLOGY  COUNTS:  YES  DICTATION: .Note written in EPIC  PLAN OF CARE: Discharge to home after PACU  PATIENT DISPOSITION:  PACU - hemodynamically stable.   Delay start of Pharmacological VTE agent (>24hrs) due to surgical blood loss or risk of bleeding: not applicable

## 2021-03-04 NOTE — Anesthesia Procedure Notes (Signed)
Procedure Name: Intubation Date/Time: 03/04/2021 7:56 AM Performed by: Signe Colt, CRNA Pre-anesthesia Checklist: Patient identified, Emergency Drugs available, Suction available and Patient being monitored Patient Re-evaluated:Patient Re-evaluated prior to induction Oxygen Delivery Method: Circle system utilized Preoxygenation: Pre-oxygenation with 100% oxygen Induction Type: IV induction Ventilation: Mask ventilation without difficulty Laryngoscope Size: Mac and 3 Grade View: Grade III Tube type: Oral Tube size: 7.0 mm Number of attempts: 1 Airway Equipment and Method: Stylet, Oral airway and Bougie stylet Placement Confirmation: ETT inserted through vocal cords under direct vision, positive ETCO2 and breath sounds checked- equal and bilateral Secured at: 21 cm Tube secured with: Tape Dental Injury: Teeth and Oropharynx as per pre-operative assessment  Difficulty Due To: Difficulty was anticipated

## 2021-03-04 NOTE — Anesthesia Postprocedure Evaluation (Signed)
Anesthesia Post Note  Patient: Heidi Costa  Procedure(s) Performed: MAMMARY REDUCTION  (BREAST) (Bilateral)     Patient location during evaluation: PACU Anesthesia Type: General Level of consciousness: awake and alert Pain management: pain level controlled Vital Signs Assessment: post-procedure vital signs reviewed and stable Respiratory status: spontaneous breathing, nonlabored ventilation, respiratory function stable and patient connected to nasal cannula oxygen Cardiovascular status: blood pressure returned to baseline and stable Postop Assessment: no apparent nausea or vomiting Anesthetic complications: no   No notable events documented.  Last Vitals:  Vitals:   03/04/21 1430 03/04/21 1527  BP: 118/85 125/87  Pulse: (!) 122 (!) 107  Resp: (!) 23 20  Temp:  36.5 C  SpO2: 97% 98%    Last Pain:  Vitals:   03/04/21 1502  TempSrc:   PainSc: 4                  Shunna Mikaelian L Jasa Dundon

## 2021-03-04 NOTE — Transfer of Care (Signed)
Immediate Anesthesia Transfer of Care Note  Patient: Heidi Costa  Procedure(s) Performed: MAMMARY REDUCTION  (BREAST) (Bilateral)  Patient Location: PACU  Anesthesia Type:General  Level of Consciousness: drowsy and patient cooperative  Airway & Oxygen Therapy: Patient Spontanous Breathing and Patient connected to face mask oxygen  Post-op Assessment: Report given to RN and Post -op Vital signs reviewed and stable  Post vital signs: Reviewed and stable  Last Vitals:  Vitals Value Taken Time  BP 147/100 03/04/21 1337  Temp    Pulse 111 03/04/21 1338  Resp 19 03/04/21 1338  SpO2 100 % 03/04/21 1338  Vitals shown include unvalidated device data.  Last Pain:  Vitals:   03/04/21 0638  TempSrc: Oral  PainSc: 0-No pain      Patients Stated Pain Goal: 3 (58/52/77 8242)  Complications: No notable events documented.

## 2021-03-05 NOTE — Addendum Note (Signed)
Addendum  created 03/05/21 1233 by Merlinda Frederick, MD   Intraprocedure Staff edited

## 2021-03-06 ENCOUNTER — Encounter (HOSPITAL_BASED_OUTPATIENT_CLINIC_OR_DEPARTMENT_OTHER): Payer: Self-pay | Admitting: Plastic Surgery

## 2021-03-06 LAB — SURGICAL PATHOLOGY

## 2021-03-09 NOTE — Progress Notes (Signed)
Patient Care Team: System, Provider Not In as PCP - General Rockwell Germany, RN as Oncology Nurse Navigator Mauro Kaufmann, RN as Oncology Nurse Navigator Rolm Bookbinder, MD as Consulting Physician (General Surgery) Nicholas Lose, MD as Consulting Physician (Hematology and Oncology) Kyung Rudd, MD as Consulting Physician (Radiation Oncology)  DIAGNOSIS:    ICD-10-CM   1. Malignant neoplasm of upper-outer quadrant of right breast in female, estrogen receptor positive ([place])  C50.411    Z17.0       SUMMARY OF ONCOLOGIC HISTORY: Oncology History  Malignant neoplasm of upper-outer quadrant of right breast in female, estrogen receptor positive ([place])  06/21/2019 Initial Diagnosis   Patient palpated a painful right breast lump x3 weeks. Mammogram showed a 2.0cm right breast mass at the 10 o'clock position, a 3.6cm enlarged right axillary lymph node, and a 0.5cm left breast mass. Biopsy showed, in the right breast and axilla, IDC, grade 3, HER-2 equivocal by IHC, + by FISH, ER+ 40%, PR - 0%, Ki67 80%, and in the left breast, fibrocystic changes, no malignancy.    07/04/2019 Cancer Staging   Staging form: Breast, AJCC 8th Edition - Clinical stage from 07/04/2019: Stage IIB (cT2, cN1(f), cM0, G3, ER+, PR-, HER2+) - Signed by Nicholas Lose, MD on 07/05/2019    07/14/2019 - 10/07/2019 Chemotherapy   The patient had dexamethasone (DECADRON) 4 MG tablet, 4 mg (100 % of original dose 4 mg), Oral, Daily, 1 of 1 cycle, Start date: 07/05/2019, End date: 08/04/2019 Dose modification: 4 mg (original dose 4 mg, Cycle 0) palonosetron (ALOXI) injection 0.25 mg, 0.25 mg, Intravenous,  Once, 5 of 5 cycles Administration: 0.25 mg (07/14/2019), 0.25 mg (08/04/2019), 0.25 mg (10/05/2019), 0.25 mg (08/25/2019), 0.25 mg (09/14/2019) pegfilgrastim-jmdb (FULPHILA) injection 6 mg, 6 mg, Subcutaneous,  Once, 5 of 5 cycles Administration: 6 mg (07/17/2019), 6 mg (08/07/2019), 6 mg (10/07/2019), 6 mg (08/29/2019), 6 mg  (09/16/2019) CARBOplatin (PARAPLATIN) 700 mg in sodium chloride 0.9 % 250 mL chemo infusion, 700 mg (100 % of original dose 700 mg), Intravenous,  Once, 5 of 5 cycles Dose modification: 700 mg (original dose 700 mg, Cycle 1, Reason: Provider Judgment), 600 mg (original dose 700 mg, Cycle 2, Reason: Dose not tolerated) Administration: 700 mg (07/14/2019), 600 mg (08/04/2019), 600 mg (10/05/2019), 600 mg (08/25/2019), 600 mg (09/14/2019) DOCEtaxel (TAXOTERE) 170 mg in sodium chloride 0.9 % 250 mL chemo infusion, 75 mg/m2 = 170 mg, Intravenous,  Once, 5 of 5 cycles Dose modification: 65 mg/m2 (original dose 75 mg/m2, Cycle 2, Reason: Dose not tolerated) Administration: 170 mg (07/14/2019), 150 mg (08/04/2019), 150 mg (10/05/2019), 150 mg (08/25/2019), 150 mg (09/14/2019) fosaprepitant (EMEND) 150 mg in sodium chloride 0.9 % 145 mL IVPB, 150 mg, Intravenous,  Once, 5 of 5 cycles Administration: 150 mg (07/14/2019), 150 mg (08/04/2019), 150 mg (10/05/2019), 150 mg (08/25/2019), 150 mg (09/14/2019) pertuzumab (PERJETA) 840 mg in sodium chloride 0.9 % 250 mL chemo infusion, 840 mg, Intravenous, Once, 5 of 5 cycles Administration: 840 mg (07/14/2019), 420 mg (08/04/2019), 420 mg (10/05/2019), 420 mg (08/25/2019), 420 mg (09/14/2019) trastuzumab-dkst (OGIVRI) 900 mg in sodium chloride 0.9 % 250 mL chemo infusion, 903 mg, Intravenous,  Once, 5 of 5 cycles Administration: 900 mg (07/14/2019), 672 mg (08/04/2019), 672 mg (10/05/2019), 672 mg (08/25/2019), 672 mg (09/14/2019)   for chemotherapy treatment.     07/22/2019 Genetic Testing   Negative genetic testing:  No pathogenic variants detected on the [facility] Common Hereditary Cancers Panel. The report date is 07/22/2019.  The Common Hereditary Cancers Panel offered by [facility] includes sequencing and/or deletion duplication testing of the following 48 genes: APC, ATM, AXIN2, BARD1, BMPR1A, BRCA1, BRCA2, BRIP1, CDH1, CDK4, CDKN2A (p14ARF), CDKN2A (p16INK4a), CHEK2, CTNNA1, DICER1, EPCAM  (Deletion/duplication testing only), GREM1 (promoter region deletion/duplication testing only), KIT, MEN1, MLH1, MSH2, MSH3, MSH6, MUTYH, NBN, NF1, NHTL1, PALB2, PDGFRA, PMS2, POLD1, POLE, PTEN, RAD50, RAD51C, RAD51D, RNF43, SDHB, SDHC, SDHD, SMAD4, SMARCA4. STK11, TP53, TSC1, TSC2, and VHL.  The following genes were evaluated for sequence changes only: SDHA and HOXB13 c.251G>A variant only.    11/20/2019 - 07/18/2020 Chemotherapy          12/13/2019 Surgery   Right lumpectomy [name]): IDC, 5 right axillary lymph nodes negative for carcinoma.    01/22/2020 - 03/12/2020 Radiation Therapy   Site/dose:   The patient initially received a dose of 50.4 Gy in 28 fractions to the breast using whole-breast tangent fields. This was delivered using a 3-D conformal technique. The patient then received a boost to the seroma. This delivered an additional 10 Gy in 5 fractions using a 3-field photon boost technique. The total dose was 60.4 Gy.       CHIEF COMPLIANT: Follow-up of HER-2 positive breast cancer   INTERVAL HISTORY: Heidi Costa is a 40 y.o. with above-mentioned history of HER-2 positive breast cancer who completed neoadjuvant chemotherapy, underwent a right lumpectomy, radiation, Herceptin Perjeta maintenance, and is currently on anastrozole and goserelin. She presents to the clinic today for follow-up.   ALLERGIES:  is allergic to aspirin.  MEDICATIONS:  Current Outpatient Medications  Medication Sig Dispense Refill   acetaminophen (TYLENOL) 500 MG tablet Take 500-1,000 mg by mouth daily as needed for pain.     albuterol (VENTOLIN HFA) 108 (90 Base) MCG/ACT inhaler Inhale 2 puffs into the lungs every 6 (six) hours as needed for wheezing or shortness of breath. 18 g 2   amLODipine (NORVASC) 2.5 MG tablet Take 1 tablet (2.5 mg total) by mouth daily. 30 tablet 11   baclofen (LIORESAL) 10 MG tablet Take 1 tablet (10 mg total) by mouth 3 (three) times daily. 90 each 3   letrozole (FEMARA)  2.5 MG tablet Take 1 tablet by mouth once daily 90 tablet 0   No current facility-administered medications for this visit.    PHYSICAL EXAMINATION: ECOG PERFORMANCE STATUS: 1 - Symptomatic but completely ambulatory  Vitals:   03/10/21 1526  BP: (!) 145/87  Pulse: (!) 114  Resp: 18  Temp: (!) 97.3 F (36.3 C)  SpO2: 100%   Filed Weights   03/10/21 1526  Weight: 245 lb 9.6 oz (111.4 kg)    BREAST: No palpable masses or nodules in either right or left breasts. No palpable axillary supraclavicular or infraclavicular adenopathy no breast tenderness or nipple discharge. (exam performed in the presence of a chaperone)  LABORATORY DATA:  I have reviewed the data as listed CMP Latest Ref Rng & Units 02/19/2021 09/03/2020 07/18/2020  Glucose 70 - 99 mg/dL 115(H) 94 114(H)  BUN 6 - 24 mg/dL _0 Creatinine 0.57 - 1.00 mg/dL 0.81 0.79 0.80  Sodium 134 - 144 mmol/L 140 138 142  Potassium 3.5 - 5.2 mmol/L 4.2 4.0 3.6  Chloride 96 - 106 mmol/L 102 105 106  CO2 20 - 29 mmol/L _1 Calcium 8.7 - 10.2 mg/dL 9.5 8.9 8.8(L)  Total Protein 6.0 - 8.5 g/dL 6.6 6.7 6.6  Total Bilirubin 0.0 - 1.2 mg/dL 0.2 0.3 0.2(L)  Alkaline  Phos 44 - 121 IU/L 82 64 69  AST 0 - 40 IU/L _0 ALT 0 - 32 IU/L _1 Lab Results  Component Value Date   WBC 4.6 02/19/2021   HGB 13.0 02/19/2021   HCT 38.9 02/19/2021   MCV 94 02/19/2021   PLT 360 02/19/2021   NEUTROABS 3.6 09/03/2020    ASSESSMENT & PLAN:  Malignant neoplasm of upper-outer quadrant of right breast in female, estrogen receptor positive ([place]) 06/21/2019: Patient palpated a painful right breast lump x3 weeks. Mammogram showed a 2.0cm right breast mass at the 10 o'clock position, a 3.6cm enlarged right axillary lymph node, and a 0.5cm left breast mass. Biopsy showed, in the right breast and axilla (lymph node), IDC, grade 3, HER-2 equivocal by IHC, + by FISH, ER+ 40%, PR - 0%, Ki67 80%,  left breast, fibrocystic changes, no  malignancy.  Can be followed.   Treatment plan: 1. Neoadjuvant chemotherapy with TCH Perjeta 5 cycles started 07/12/2019 completed 10/05/2019 followed by Herceptin Perjeta completed 07/18/2020 2. breast conserving surgery with sentinel lymph node study: 12/13/2019: Pathologic complete response, 0/5 lymph nodes negative 3. Adjuvant radiation therapy 01/23/2020- 03/12/2020 4.  Followed by antiestrogen therapy with anastrozole 1 mg daily started 03/30/2020 ----------------------------------------------------------------------------------------------------------------------------------------------------------- Current treatment:  anastrozole with Zoladex Anastrozole toxicities:  Abdominal cramps: She was also baclofen periodically.  She gets 4-5 abdominal cramps per week. Hot flashes    Breast cancer surveillance: Mammograms annually March 2022: Benign at [facility]  We will schedule monthly Zoladex injections  03/04/21: Bil Mammaplasty: Benign    Return to clinic in 1 year for follow-up    No orders of the defined types were placed in this encounter.  The patient has a good understanding of the overall plan. she agrees with it. she will call with any problems that may develop before the next visit here.  Total time spent: 20 mins including face to face time and time spent for planning, charting and coordination of care  Rulon Eisenmenger, MD, MPH 03/10/2021  I, Thana Ates, am acting as scribe for Dr. Nicholas Lose.  I have reviewed the above documentation for accuracy and completeness, and I agree with the above.

## 2021-03-09 NOTE — Assessment & Plan Note (Signed)
06/21/2019:Patient palpated a painful right breast lump x3 weeks. Mammogram showed a 2.0cm right breast mass at the 10 o'clock position, a 3.6cm enlarged right axillary lymph node, and a 0.5cm left breast mass. Biopsy showed, in the right breast and axilla(lymph node), IDC, grade 3, HER-2 equivocal by IHC, + by FISH, ER+ 40%, PR - 0%, Ki67 80%,  left breast, fibrocystic changes, no malignancy.Can be followed.  Treatment plan: 1. Neoadjuvant chemotherapy with TCH Perjeta 5cycles started 07/12/2019 completed 7/8/2021followed by HerceptinPerjeta completed 07/18/2020 2. breast conserving surgery with sentinel lymph node study: 12/13/2019: Pathologic complete response, 0/5 lymph nodes negative 3. Adjuvant radiation therapy10/26/2021- 03/12/2020 4.Followed by antiestrogen therapy with anastrozole 1 mg daily started 03/30/2020 ----------------------------------------------------------------------------------------------------------------------------------------------------------- Current treatment:  anastrozole with Zoladex Anastrozole toxicities:  Abdominal cramps: She was also baclofen periodically.  She gets 4-5 abdominal cramps per week. Hot flashes    Breast cancer surveillance: Mammograms annually  MRI Breast 11/21/20: Significantly improved malignancy 15.9 X 6.2 X 3.2 cm  We will schedule monthly Zoladex injections  03/04/21: Bil Mammaplasty: Benign   Return to clinic in 1 year for follow-up

## 2021-03-10 ENCOUNTER — Inpatient Hospital Stay: Payer: BC Managed Care – PPO

## 2021-03-10 ENCOUNTER — Inpatient Hospital Stay: Payer: BC Managed Care – PPO | Attending: Hematology and Oncology | Admitting: Hematology and Oncology

## 2021-03-10 ENCOUNTER — Other Ambulatory Visit: Payer: Self-pay

## 2021-03-10 VITALS — BP 145/83 | HR 102 | Temp 98.7°F | Resp 18

## 2021-03-10 DIAGNOSIS — C50411 Malignant neoplasm of upper-outer quadrant of right female breast: Secondary | ICD-10-CM

## 2021-03-10 DIAGNOSIS — Z95828 Presence of other vascular implants and grafts: Secondary | ICD-10-CM

## 2021-03-10 DIAGNOSIS — Z170 Estrogen receptor positive status [ER+]: Secondary | ICD-10-CM

## 2021-03-10 MED ORDER — GOSERELIN ACETATE 3.6 MG ~~LOC~~ IMPL
3.6000 mg | DRUG_IMPLANT | Freq: Once | SUBCUTANEOUS | Status: AC
  Administered 2021-03-10: 3.6 mg via SUBCUTANEOUS
  Filled 2021-03-10: qty 3.6

## 2021-03-10 MED ORDER — LETROZOLE 2.5 MG PO TABS: 2.5000 mg | ORAL_TABLET | Freq: Every day | ORAL | 3 refills | Status: DC

## 2021-03-10 NOTE — Patient Instructions (Signed)
Goserelin injection °What is this medication? °GOSERELIN (GOE se rel in) is similar to a hormone found in the body. It lowers the amount of sex hormones that the body makes. Men will have lower testosterone levels and women will have lower estrogen levels while taking this medicine. In men, this medicine is used to treat prostate cancer; the injection is either given once per month or once every 12 weeks. A once per month injection (only) is used to treat women with endometriosis, dysfunctional uterine bleeding, or advanced breast cancer. °This medicine may be used for other purposes; ask your health care provider or pharmacist if you have questions. °COMMON BRAND NAME(S): Zoladex, Zoladex 3-Month °What should I tell my care team before I take this medication? °They need to know if you have any of these conditions: °bone problems °diabetes °heart disease °history of irregular heartbeat °an unusual or allergic reaction to goserelin, other medicines, foods, dyes, or preservatives °pregnant or trying to get pregnant °breast-feeding °How should I use this medication? °This medicine is for injection under the skin. It is given by a health care professional in a hospital or clinic setting. °Talk to your pediatrician regarding the use of this medicine in children. Special care may be needed. °Overdosage: If you think you have taken too much of this medicine contact a poison control center or emergency room at once. °NOTE: This medicine is only for you. Do not share this medicine with others. °What if I miss a dose? °It is important not to miss your dose. Call your doctor or health care professional if you are unable to keep an appointment. °What may interact with this medication? °Do not take this medicine with any of the following medications: °cisapride °dronedarone °pimozide °thioridazine °This medicine may also interact with the following medications: °other medicines that prolong the QT interval (an abnormal heart  rhythm) °This list may not describe all possible interactions. Give your health care provider a list of all the medicines, herbs, non-prescription drugs, or dietary supplements you use. Also tell them if you smoke, drink alcohol, or use illegal drugs. Some items may interact with your medicine. °What should I watch for while using this medication? °Visit your doctor or health care provider for regular checks on your progress. Your symptoms may appear to get worse during the first weeks of this therapy. Tell your doctor or healthcare provider if your symptoms do not start to get better or if they get worse after this time. °Your bones may get weaker if you take this medicine for a long time. If you smoke or frequently drink alcohol you may increase your risk of bone loss. A family history of osteoporosis, chronic use of drugs for seizures (convulsions), or corticosteroids can also increase your risk of bone loss. Talk to your doctor about how to keep your bones strong. °This medicine should stop regular monthly menstruation in women. Tell your doctor if you continue to menstruate. °Women should not become pregnant while taking this medicine or for 12 weeks after stopping this medicine. Women should inform their doctor if they wish to become pregnant or think they might be pregnant. There is a potential for serious side effects to an unborn child. Talk to your health care professional or pharmacist for more information. Do not breast-feed an infant while taking this medicine. °Men should inform their doctors if they wish to father a child. This medicine may lower sperm counts. Talk to your health care professional or pharmacist for more information. °This   medicine may increase blood sugar. Ask your healthcare provider if changes in diet or medicines are needed if you have diabetes. °What side effects may I notice from receiving this medication? °Side effects that you should report to your doctor or health care  professional as soon as possible: °allergic reactions like skin rash, itching or hives, swelling of the face, lips, or tongue °bone pain °breathing problems °changes in vision °chest pain °feeling faint or lightheaded, falls °fever, chills °pain, swelling, warmth in the leg °pain, tingling, numbness in the hands or feet °signs and symptoms of high blood sugar such as being more thirsty or hungry or having to urinate more than normal. You may also feel very tired or have blurry vision °signs and symptoms of low blood pressure like dizziness; feeling faint or lightheaded, falls; unusually weak or tired °stomach pain °swelling of the ankles, feet, hands °trouble passing urine or change in the amount of urine °unusually high or low blood pressure °unusually weak or tired °Side effects that usually do not require medical attention (report to your doctor or health care professional if they continue or are bothersome): °change in sex drive or performance °changes in breast size in both males and females °changes in emotions or moods °headache °hot flashes °irritation at site where injected °loss of appetite °skin problems like acne, dry skin °vaginal dryness °This list may not describe all possible side effects. Call your doctor for medical advice about side effects. You may report side effects to FDA at 1-800-FDA-1088. °Where should I keep my medication? °This drug is given in a hospital or clinic and will not be stored at home. °NOTE: This sheet is a summary. It may not cover all possible information. If you have questions about this medicine, talk to your doctor, pharmacist, or health care provider. °© 2022 [employer] (2018-07-15 00:00:00) ° °

## 2021-03-11 ENCOUNTER — Telehealth: Payer: Self-pay | Admitting: Hematology and Oncology

## 2021-03-11 NOTE — Telephone Encounter (Signed)
Scheduled appointment per 12/12 los. Patient is aware. 

## 2021-03-14 ENCOUNTER — Encounter (HOSPITAL_BASED_OUTPATIENT_CLINIC_OR_DEPARTMENT_OTHER): Payer: Self-pay

## 2021-03-14 ENCOUNTER — Ambulatory Visit (HOSPITAL_BASED_OUTPATIENT_CLINIC_OR_DEPARTMENT_OTHER): Admit: 2021-03-14 | Payer: BC Managed Care – PPO | Admitting: Plastic Surgery

## 2021-03-14 SURGERY — MAMMOPLASTY, REDUCTION
Anesthesia: General | Site: Breast | Laterality: Bilateral

## 2021-03-20 ENCOUNTER — Encounter: Payer: BC Managed Care – PPO | Admitting: Plastic Surgery

## 2021-03-21 ENCOUNTER — Ambulatory Visit (HOSPITAL_COMMUNITY)
Admission: EM | Admit: 2021-03-21 | Discharge: 2021-03-21 | Disposition: A | Discharge status: Home / Self Care | Payer: BC Managed Care – PPO | Attending: Urgent Care | Admitting: Urgent Care

## 2021-03-21 ENCOUNTER — Telehealth: Payer: Self-pay

## 2021-03-21 ENCOUNTER — Inpatient Hospital Stay (HOSPITAL_COMMUNITY)
Admission: EM | Admit: 2021-03-21 | Discharge: 2021-03-24 | DRG: 638 | Disposition: A | Discharge status: Home / Self Care | Payer: BC Managed Care – PPO | Attending: Internal Medicine | Admitting: Internal Medicine

## 2021-03-21 ENCOUNTER — Other Ambulatory Visit: Payer: Self-pay

## 2021-03-21 ENCOUNTER — Encounter (HOSPITAL_COMMUNITY): Payer: Self-pay

## 2021-03-21 DIAGNOSIS — E1310 Other specified diabetes mellitus with ketoacidosis without coma: Secondary | ICD-10-CM

## 2021-03-21 DIAGNOSIS — Z886 Allergy status to analgesic agent status: Secondary | ICD-10-CM

## 2021-03-21 DIAGNOSIS — E1110 Type 2 diabetes mellitus with ketoacidosis without coma: Principal | ICD-10-CM | POA: Diagnosis present

## 2021-03-21 DIAGNOSIS — Z823 Family history of stroke: Secondary | ICD-10-CM

## 2021-03-21 DIAGNOSIS — Z79811 Long term (current) use of aromatase inhibitors: Secondary | ICD-10-CM

## 2021-03-21 DIAGNOSIS — Z6841 Body Mass Index (BMI) 40.0 and over, adult: Secondary | ICD-10-CM

## 2021-03-21 DIAGNOSIS — D75839 Thrombocytosis, unspecified: Secondary | ICD-10-CM

## 2021-03-21 DIAGNOSIS — E860 Dehydration: Secondary | ICD-10-CM | POA: Diagnosis present

## 2021-03-21 DIAGNOSIS — C50919 Malignant neoplasm of unspecified site of unspecified female breast: Secondary | ICD-10-CM

## 2021-03-21 DIAGNOSIS — Z79899 Other long term (current) drug therapy: Secondary | ICD-10-CM

## 2021-03-21 DIAGNOSIS — Z833 Family history of diabetes mellitus: Secondary | ICD-10-CM

## 2021-03-21 DIAGNOSIS — Z20822 Contact with and (suspected) exposure to covid-19: Secondary | ICD-10-CM | POA: Diagnosis present

## 2021-03-21 DIAGNOSIS — Z803 Family history of malignant neoplasm of breast: Secondary | ICD-10-CM

## 2021-03-21 DIAGNOSIS — K219 Gastro-esophageal reflux disease without esophagitis: Secondary | ICD-10-CM | POA: Diagnosis present

## 2021-03-21 DIAGNOSIS — Z853 Personal history of malignant neoplasm of breast: Secondary | ICD-10-CM

## 2021-03-21 DIAGNOSIS — N179 Acute kidney failure, unspecified: Secondary | ICD-10-CM

## 2021-03-21 DIAGNOSIS — R350 Frequency of micturition: Secondary | ICD-10-CM | POA: Diagnosis present

## 2021-03-21 DIAGNOSIS — H538 Other visual disturbances: Secondary | ICD-10-CM | POA: Diagnosis present

## 2021-03-21 DIAGNOSIS — Z9221 Personal history of antineoplastic chemotherapy: Secondary | ICD-10-CM

## 2021-03-21 DIAGNOSIS — Z923 Personal history of irradiation: Secondary | ICD-10-CM

## 2021-03-21 DIAGNOSIS — I10 Essential (primary) hypertension: Secondary | ICD-10-CM | POA: Diagnosis present

## 2021-03-21 DIAGNOSIS — Z8249 Family history of ischemic heart disease and other diseases of the circulatory system: Secondary | ICD-10-CM

## 2021-03-21 DIAGNOSIS — R112 Nausea with vomiting, unspecified: Secondary | ICD-10-CM

## 2021-03-21 DIAGNOSIS — E785 Hyperlipidemia, unspecified: Secondary | ICD-10-CM | POA: Diagnosis present

## 2021-03-21 DIAGNOSIS — J45909 Unspecified asthma, uncomplicated: Secondary | ICD-10-CM | POA: Diagnosis present

## 2021-03-21 DIAGNOSIS — E0810 Diabetes mellitus due to underlying condition with ketoacidosis without coma: Secondary | ICD-10-CM

## 2021-03-21 DIAGNOSIS — R059 Cough, unspecified: Secondary | ICD-10-CM

## 2021-03-21 DIAGNOSIS — E876 Hypokalemia: Secondary | ICD-10-CM | POA: Diagnosis present

## 2021-03-21 DIAGNOSIS — F1210 Cannabis abuse, uncomplicated: Secondary | ICD-10-CM | POA: Diagnosis present

## 2021-03-21 DIAGNOSIS — F419 Anxiety disorder, unspecified: Secondary | ICD-10-CM | POA: Diagnosis present

## 2021-03-21 LAB — COMPREHENSIVE METABOLIC PANEL
ALT: 23 U/L (ref 0–44)
AST: 20 U/L (ref 15–41)
Albumin: 3.9 g/dL (ref 3.5–5.0)
Alkaline Phosphatase: 105 U/L (ref 38–126)
Anion gap: 25 — ABNORMAL HIGH (ref 5–15)
BUN: 30 mg/dL — ABNORMAL HIGH (ref 6–20)
CO2: 10 mmol/L — ABNORMAL LOW (ref 22–32)
Calcium: 9.3 mg/dL (ref 8.9–10.3)
Chloride: 96 mmol/L — ABNORMAL LOW (ref 98–111)
Creatinine, Ser: 2.2 mg/dL — ABNORMAL HIGH (ref 0.44–1.00)
GFR, Estimated: 28 mL/min — ABNORMAL LOW (ref >60)
Glucose, Bld: 1141 mg/dL (ref 70–99)
Potassium: 4.8 mmol/L (ref 3.5–5.1)
Sodium: 131 mmol/L — ABNORMAL LOW (ref 135–145)
Total Bilirubin: 1.3 mg/dL — ABNORMAL HIGH (ref 0.3–1.2)
Total Protein: 7.8 g/dL (ref 6.5–8.1)

## 2021-03-21 LAB — CBC WITH DIFFERENTIAL/PLATELET
Abs Immature Granulocytes: 0.07 10*3/uL (ref 0.00–0.07)
Basophils Absolute: 0 10*3/uL (ref 0.0–0.1)
Basophils Relative: 0 %
Eosinophils Absolute: 0 10*3/uL (ref 0.0–0.5)
Eosinophils Relative: 0 %
HCT: 40.2 % (ref 36.0–46.0)
Hemoglobin: 12.5 g/dL (ref 12.0–15.0)
Immature Granulocytes: 1 %
Lymphocytes Relative: 5 %
Lymphs Abs: 0.6 10*3/uL — ABNORMAL LOW (ref 0.7–4.0)
MCH: 32.4 pg (ref 26.0–34.0)
MCHC: 31.1 g/dL (ref 30.0–36.0)
MCV: 104.1 fL — ABNORMAL HIGH (ref 80.0–100.0)
Monocytes Absolute: 0.5 10*3/uL (ref 0.1–1.0)
Monocytes Relative: 5 %
Neutro Abs: 9.2 10*3/uL — ABNORMAL HIGH (ref 1.7–7.7)
Neutrophils Relative %: 89 %
Platelets: 562 10*3/uL — ABNORMAL HIGH (ref 150–400)
RBC: 3.86 MIL/uL — ABNORMAL LOW (ref 3.87–5.11)
RDW: 14.3 % (ref 11.5–15.5)
WBC: 10.4 10*3/uL (ref 4.0–10.5)
nRBC: 0 % (ref 0.0–0.2)

## 2021-03-21 LAB — URINALYSIS, MICROSCOPIC (REFLEX)

## 2021-03-21 LAB — CBG MONITORING, ED: Glucose-Capillary: >600 mg/dL (ref 70–99)

## 2021-03-21 LAB — URINALYSIS, ROUTINE W REFLEX MICROSCOPIC
Bilirubin Urine: NEGATIVE
Glucose, UA: >=500 mg/dL — AB
Ketones, ur: >80 mg/dL — AB
Leukocytes,Ua: NEGATIVE
Nitrite: NEGATIVE
Protein, ur: NEGATIVE mg/dL
Specific Gravity, Urine: 1.015 (ref 1.005–1.030)
pH: 5.5 (ref 5.0–8.0)

## 2021-03-21 LAB — POCT URINALYSIS DIPSTICK, ED / UC
Bilirubin Urine: NEGATIVE
Glucose, UA: 500 mg/dL — AB
Ketones, ur: 80 mg/dL — AB
Leukocytes,Ua: NEGATIVE
Nitrite: NEGATIVE
Protein, ur: NEGATIVE mg/dL
Specific Gravity, Urine: <=1.005 (ref 1.005–1.030)
Urobilinogen, UA: 0.2 mg/dL (ref 0.0–1.0)
pH: 5 (ref 5.0–8.0)

## 2021-03-21 LAB — I-STAT VENOUS BLOOD GAS, ED
Acid-base deficit: 14 mmol/L — ABNORMAL HIGH (ref 0.0–2.0)
Bicarbonate: 10.7 mmol/L — ABNORMAL LOW (ref 20.0–28.0)
Calcium, Ion: 1.16 mmol/L (ref 1.15–1.40)
HCT: 40 % (ref 36.0–46.0)
Hemoglobin: 13.6 g/dL (ref 12.0–15.0)
O2 Saturation: 97 %
Potassium: 5.2 mmol/L — ABNORMAL HIGH (ref 3.5–5.1)
Sodium: 131 mmol/L — ABNORMAL LOW (ref 135–145)
TCO2: 11 mmol/L — ABNORMAL LOW (ref 22–32)
pCO2, Ven: 23.3 mmHg — ABNORMAL LOW (ref 44.0–60.0)
pH, Ven: 7.271 (ref 7.250–7.430)
pO2, Ven: 106 mmHg — ABNORMAL HIGH (ref 32.0–45.0)

## 2021-03-21 LAB — BETA-HYDROXYBUTYRIC ACID: Beta-Hydroxybutyric Acid: >8 mmol/L — ABNORMAL HIGH (ref 0.05–0.27)

## 2021-03-21 LAB — MAGNESIUM: Magnesium: 2.9 mg/dL — ABNORMAL HIGH (ref 1.7–2.4)

## 2021-03-21 MED ORDER — DEXTROSE IN LACTATED RINGERS 5 % IV SOLN: INTRAVENOUS | Status: DC

## 2021-03-21 MED ORDER — INSULIN REGULAR(HUMAN) IN NACL 100-0.9 UT/100ML-% IV SOLN
INTRAVENOUS | Status: DC
  Administered 2021-03-21: 5 [IU]/h via INTRAVENOUS
  Filled 2021-03-21: qty 100

## 2021-03-21 MED ORDER — SODIUM CHLORIDE 0.9 % IV BOLUS
2000.0000 mL | Freq: Once | INTRAVENOUS | Status: AC
  Administered 2021-03-21: 21:00:00 2000 mL via INTRAVENOUS

## 2021-03-21 MED ORDER — DEXTROSE 50 % IV SOLN
0.0000 mL | INTRAVENOUS | Status: DC | PRN
Start: 2021-03-21 — End: 2021-03-22

## 2021-03-21 NOTE — ED Notes (Signed)
Patient is being discharged from the Urgent Care and sent to the Emergency Department via POV . Per Manfred Shirts, patient is in need of higher level of care due to hyperglycemia. Patient is aware and verbalizes understanding of plan of care.  Vitals:   03/21/21 1754  BP: (!) 164/96  Pulse: (!) 112  Resp: 20  Temp: 98.6 F (37 C)  SpO2: 99%

## 2021-03-21 NOTE — ED Provider Notes (Signed)
[facility] EMERGENCY DEPARTMENT Provider Note   CSN: [id number] Arrival date & time: 03/21/21  1923     History Chief Complaint  Patient presents with   Hyperglycemia    Heidi Costa is a 40 y.o. female with history of breast cancer status post bilateral breast reduction on the sixth of this month who presents to the emergency department with elevated blood sugars in general feeling unwell over the last week.  Patient states that she recently stopped a steroid regimen on the 15th of this month.  She has been having increasing amount of general fatigue and generalized weakness with associated nausea and intractable vomiting.  Is severe to the point that she is unable to keep any liquids down.  She also reports associated polydipsia and polyuria although she attributes this to drinking a lot of water.  She denies any fever, chills, abdominal pain, cough, congestion, diarrhea.  Nothing seems to make these episodes better or worse.  She does not take any medications for her symptoms.  Patient was seen and evaluated in urgent care but was sent to the emergency department for further evaluation secondary to blood sugars greater than 600.  Old records were reviewed and her most recent A1c was 7.1 in November.  She does not endorse any history of diabetes.   Hyperglycemia     Past Medical History:  Diagnosis Date   Abnormality of gait 08/02/2012   resolved per patient on 07/12/19   Anemia    iron taking in the past, not currently   Anxiety    Arthritis    spine- cerv.  & arm    Asthma    Bulging lumbar disc    Cancer ([place])    Right Breast-starts chemo on 07/14/19   Chronic arthralgias of knees and hips 08/02/2012   Depression    Pt. took self off Celexa one month ago, states she did n't like how it made her feel .   Family history of cancer    GERD (gastroesophageal reflux disease)    diet controlled   Heart murmur    pt. been told that this is a fact, no  echo or cardiac surveillance in the past    HLD (hyperlipidemia)    no meds, diet controlled   Hypertension    MRSA (methicillin resistant Staphylococcus aureus) 03/02/2005   Obesity    Spinal stenosis     Patient Active Problem List   Diagnosis Date Noted   DKA (diabetic ketoacidosis) ([place]) 03/22/2021   Mild persistent asthma without complication 81/03/7508   Port-A-Cath in place 08/04/2019   Genetic testing 07/24/2019   Family history of cancer    Dysphagia 06/23/2019   Malignant neoplasm of upper-outer quadrant of right breast in female, estrogen receptor positive ([place]) 06/21/2019   Tobacco consumption 06/06/2019   Breast mass, right 06/06/2019   Sensation of change in body temperature 06/06/2019   Neuropathic pain 06/06/2019   Bite, insect 10/19/2016   Swelling of eyelid, left 10/19/2016   Essential hypertension 08/10/2016   Disturbance of skin sensation 05/08/2013   Cervical spondylosis with myelopathy 05/08/2013   Abnormality of gait 08/02/2012   Chronic arthralgias of knees and hips 08/02/2012    Past Surgical History:  Procedure Laterality Date   ANTERIOR CERVICAL DECOMP/DISCECTOMY FUSION N/A 10/27/2012   Procedure: ANTERIOR CERVICAL DECOMPRESSION/DISCECTOMY FUSION 1 LEVEL;  Surgeon: Faythe Ghee, MD;  Location: [facility];  Service: Neurosurgery;  Laterality: N/A;  ANTERIOR CERVICAL DECOMPRESSION/DISCECTOMY FUSION 1 LEVEL  BREAST LUMPECTOMY WITH RADIOACTIVE SEED AND SENTINEL LYMPH NODE BIOPSY Right 12/13/2019   Procedure: RIGHT BREAST LUMPECTOMY WITH RADIOACTIVE SEED AND RIGHT AXILLARY  SENTINEL LYMPH NODE BIOPSY, RIGHT AXILLARY NODE SEED GUIDED EXCISION, BLUE DYE INJECTION;  Surgeon: Rolm Bookbinder, MD;  Location: [facility];  Service: General;  Laterality: Right;  PEC BLOCK   BREAST REDUCTION SURGERY Bilateral 03/04/2021   Procedure: MAMMARY REDUCTION  (BREAST);  Surgeon: Contogiannis, Audrea Muscat, MD;  Location: [facility];  Service:  Plastics;  Laterality: Bilateral;   CHALAZION EXCISION     L eye    PORTACATH PLACEMENT Left 07/13/2019   Procedure: INSERTION PORT-A-CATH WITH ULTRASOUND;  Surgeon: Rolm Bookbinder, MD;  Location: [facility];  Service: General;  Laterality: Left;   VAGINAL DELIVERY  2002   WISDOM TOOTH EXTRACTION       OB History     Gravida  1   Para      Term      Preterm      AB  0   Living  1      SAB  0   IAB      Ectopic  0   Multiple      Live Births              Family History  Problem Relation Age of Onset   Osteoarthritis Mother    Hypertension Mother    Diabetes Mother    Hypertension Father    Gout Father    Stroke Father 78       embolic   Diabetes Half-Brother    Hypertension Half-Brother    Heart failure Half-Brother 19   Kidney disease Half-Brother    Diabetes Paternal Grandfather    Cancer Paternal Grandfather 59       breast cancer    Social History   Tobacco Use   Smoking status: Former    Packs/day: 1.00    Years: 10.00    Pack years: 10.00    Types: Cigarettes    Quit date: 06/24/2020    Years since quitting: 0.7   Smokeless tobacco: Never  Vaping Use   Vaping Use: Never used  Substance Use Topics   Alcohol use: Yes    Comment: occas   Drug use: Not Currently    Types: Marijuana    Comment: uses daily - last use 07/12/19    Home Medications Prior to Admission medications   Medication Sig Start Date End Date Taking? Authorizing Provider  acetaminophen (TYLENOL) 500 MG tablet Take 500-1,000 mg by mouth daily as needed for pain.   Yes [provider]  albuterol (VENTOLIN HFA) 108 (90 Base) MCG/ACT inhaler Inhale 2 puffs into the lungs every 6 (six) hours as needed for wheezing or shortness of breath. 09/01/19  Yes Jacelyn Pi, Irma M, MD  amLODipine (NORVASC) 2.5 MG tablet Take 1 tablet (2.5 mg total) by mouth daily. 02/19/21 02/19/22 Yes Minette Brine, FNP  baclofen (LIORESAL) 10 MG tablet Take 1 tablet (10 mg total) by mouth  3 (three) times daily. 09/03/20  Yes Nicholas Lose, MD  letrozole [facility]) 2.5 MG tablet Take 1 tablet (2.5 mg total) by mouth daily. 03/10/21  Yes Nicholas Lose, MD  oxyCODONE-acetaminophen (PERCOCET/ROXICET) 5-325 MG tablet Take 1-2 tablets by mouth every 4 (four) hours as needed. 03/10/21  Yes [provider]  prochlorperazine (COMPAZINE) 10 MG tablet Take 1 tablet (10 mg total) by mouth every 6 (six) hours as needed (Nausea or vomiting).  07/05/19 11/20/19  Nicholas Lose, MD    Allergies    Aspirin  Review of Systems   Review of Systems  All other systems reviewed and are negative.  Physical Exam Updated Vital Signs BP (!) 139/110    Pulse 95    Temp 98.2 F (36.8 C) (Oral)    Resp 18    LMP  (LMP Unknown) Comment: June 2022   SpO2 100%   Physical Exam Vitals and nursing note reviewed.  Constitutional:      General: She is not in acute distress.    Appearance: Normal appearance.  HENT:     Head: Normocephalic and atraumatic.  Eyes:     General:        Right eye: No discharge.        Left eye: No discharge.  Cardiovascular:     Comments: Tachycardic.  S1/S2 are distinct without any evidence of murmur, rubs, or gallops.  Radial pulses are 2+ bilaterally.  Dorsalis pedis pulses are 2+ bilaterally.  No evidence of pedal edema. Pulmonary:     Comments: Clear to auscultation bilaterally.  Tachypnic.  No respiratory distress.  No evidence of wheezes, rales, or rhonchi heard throughout. Abdominal:     General: Abdomen is flat. Bowel sounds are normal. There is no distension.     Tenderness: There is no abdominal tenderness. There is no guarding or rebound.  Musculoskeletal:        General: Normal range of motion.     Cervical back: Neck supple.  Skin:    General: Skin is warm and dry.     Findings: No rash.  Neurological:     General: No focal deficit present.     Mental Status: She is alert.     GCS: GCS eye subscore is 4. GCS verbal subscore is 5. GCS motor subscore is  6.  Psychiatric:        Mood and Affect: Mood normal.        Behavior: Behavior normal.    ED Results / Procedures / Treatments   Labs (all labs ordered are listed, but only abnormal results are displayed) Labs Reviewed  COMPREHENSIVE METABOLIC PANEL - Abnormal; Notable for the following components:      Result Value   Sodium 131 (*)    Chloride 96 (*)    CO2 10 (*)    Glucose, Bld 1,141 (*)    BUN 30 (*)    Creatinine, Ser 2.20 (*)    Total Bilirubin 1.3 (*)    GFR, Estimated 28 (*)    Anion gap 25 (*)    All other components within normal limits  CBC WITH DIFFERENTIAL/PLATELET - Abnormal; Notable for the following components:   RBC 3.86 (*)    MCV 104.1 (*)    Platelets 562 (*)    Neutro Abs 9.2 (*)    Lymphs Abs 0.6 (*)    All other components within normal limits  MAGNESIUM - Abnormal; Notable for the following components:   Magnesium 2.9 (*)    All other components within normal limits  BETA-HYDROXYBUTYRIC ACID - Abnormal; Notable for the following components:   Beta-Hydroxybutyric Acid >8.00 (*)    All other components within normal limits  URINALYSIS, ROUTINE W REFLEX MICROSCOPIC - Abnormal; Notable for the following components:   Glucose, UA >=500 (*)    Hgb urine dipstick MODERATE (*)    Ketones, ur >80 (*)    All other components within normal limits  URINALYSIS, MICROSCOPIC (REFLEX) -  Abnormal; Notable for the following components:   Bacteria, UA RARE (*)    All other components within normal limits  I-STAT VENOUS BLOOD GAS, ED - Abnormal; Notable for the following components:   pCO2, Ven 23.3 (*)    pO2, Ven 106.0 (*)    Bicarbonate 10.7 (*)    TCO2 11 (*)    Acid-base deficit 14.0 (*)    Sodium 131 (*)    Potassium 5.2 (*)    All other components within normal limits  CBG MONITORING, ED - Abnormal; Notable for the following components:   Glucose-Capillary >600 (*)    All other components within normal limits  RESP PANEL BY RT-PCR (FLU A&B, COVID)  ARPGX2    EKG None  Radiology No results found.  Procedures .Critical Care Performed by: Hendricks Limes, PA-C Authorized by: Hendricks Limes, PA-C   Critical care provider statement:    Critical care time (minutes):  75   Critical care time was exclusive of:  Separately billable procedures and treating other patients   Critical care was necessary to treat or prevent imminent or life-threatening deterioration of the following conditions:  Circulatory failure, dehydration and metabolic crisis   Critical care was time spent personally by me on the following activities:  Ordering and performing treatments and interventions, ordering and review of laboratory studies, ordering and review of radiographic studies, pulse oximetry, re-evaluation of patient's condition, review of old charts and examination of patient   I assumed direction of critical care for this patient from another provider in my specialty: no     Care discussed with: admitting provider     Medications Ordered in ED Medications  insulin regular, human (MYXREDLIN) 100 units/ 100 mL infusion (5 Units/hr Intravenous New Bag/Given 03/21/21 2330)  dextrose 5 % in lactated ringers infusion (0 mLs Intravenous Hold 03/21/21 2322)  dextrose 50 % solution 0-50 mL (has no administration in time range)  sodium bicarbonate 150 mEq in sterile water 1,150 mL infusion (has no administration in time range)  sodium chloride 0.9 % bolus 2,000 mL (0 mLs Intravenous Stopped 03/21/21 2322)    ED Course  I have reviewed the triage vital signs and the nursing notes.  Pertinent labs & imaging results that were available during my care of the patient were reviewed by me and considered in my medical decision making (see chart for details).  Clinical Course as of 03/22/21 0035  Sat Mar 22, 2021  0034 Spoke with Dr. Wandra Feinstein who agrees to admit the patient.  She does recommend starting a bicarb drip given that her bicarb is still low.  We  will started on 125cc an hour. [CF]    Clinical Course User Index [CF] Cherrie Gauze   MDM Rules/Calculators/A&P                          Heidi Costa is a 40 y.o. female who presents to the emergency department with general feeling of unwell and elevated blood sugars.  Concerned that this is possibly DKA.  Appropriate work-up was ordered.  I immediately started the patient on 2 L of fluid for fluid resuscitation.  CBC was without any evidence of leukocytosis.  Venous blood gas did show anion gap and low bicarb without any evidence of acidosis however pH was on the low end.  Potassium was 5.2.  CMP revealed glucose over 1100 with an AKI.  Mag was 2.9.  Urinalysis without any  signs of infection but was positive for ketones.  Beta hydroxybutyrate was positive and over 8.  I started the patient immediately on 2 L of resuscitation fluid after getting the venous blood gas results.  This is likely DKA.  Patient has been mentating well and has had no obvious signs of altered mental status on my initial exam or repeat evaluations. I started the patient on an insulin drip.  Given the clinical scenario, I believe she would benefit from further evaluation in the hospital.  She will be admitted to the hospitalist service.  I will start a bicarb drip per hospitalist request.     Final Clinical Impression(s) / ED Diagnoses Final diagnoses:  Diabetic ketoacidosis without coma associated with other specified diabetes mellitus [facility])    Rx / [place] Orders ED Discharge Orders     None        Cherrie Gauze 03/22/21 0036    Luna Fuse, MD 03/27/21 [phone]

## 2021-03-21 NOTE — ED Triage Notes (Signed)
EMS arrival. Called to Urgent Care. BG reading above 600. Hypertensive per EMS. States did not take blood pressure medicine today.

## 2021-03-21 NOTE — ED Notes (Signed)
Purewick has been placed pt tolerated well. Pt linen was also changed.

## 2021-03-21 NOTE — Discharge Instructions (Signed)
Patient with s/sx consistent with DKA vs HHS. Glucose in office >600. Breath smells of acetone. Pt is alert but lethargic therefore EMS was called to transport patient to ER to initiate treatment.

## 2021-03-21 NOTE — Telephone Encounter (Signed)
Pt called and c/o loss of appetite, loss of taste, fatigue & blurred vision. Pt also c/o dysuria and nocturia but no burning/pain. Pt states she took a COVID test and it is negative and states she has felt this way since her mammoplasty. Advised pt to call surgeon's office and with no success seek urgent care since it is so late in the day here and we will not see patients again until 12/27. Pt verbalized thanks and understanding.

## 2021-03-21 NOTE — ED Triage Notes (Signed)
Pt states had bilateral breast reduction on 12/6 and her sx's started 12/13. Pt c/o fatigue, headache, vomiting, dry mouth, depression, decrease appetite, and urinary frequency. States notified her oncologist, surgeon, and her PCP. Was treated for a UTI. States has completed chemo.

## 2021-03-21 NOTE — ED Provider Notes (Signed)
[place]    CSN: [id number] Arrival date & time: 03/21/21  1722      History   Chief Complaint Chief Complaint  Patient presents with   Emesis    HPI Heidi Costa is a 40 y.o. female.   Pleasant 40 year old female presents today with concerns of fatigue, headache, vomiting, dry mouth, depression, decreased appetite, urinary frequency, blurred vision for the past 10 days.  She had a breast reduction completed on December 6.  Patient is a breast cancer survivor and completed chemotherapy earlier this year.  She still continues to get goserelin injections, and had her last one on the 12th.  She also recently completed a steroid pack and believes she ended this on the 12th as well.  She denies any history of diabetes.   Emesis  Past Medical History:  Diagnosis Date   Abnormality of gait 08/02/2012   resolved per patient on 07/12/19   Anemia    iron taking in the past, not currently   Anxiety    Arthritis    spine- cerv.  & arm    Asthma    Bulging lumbar disc    Cancer ([place])    Right Breast-starts chemo on 07/14/19   Chronic arthralgias of knees and hips 08/02/2012   Depression    Pt. took self off Celexa one month ago, states she did n't like how it made her feel .   Family history of cancer    GERD (gastroesophageal reflux disease)    diet controlled   Heart murmur    pt. been told that this is a fact, no echo or cardiac surveillance in the past    HLD (hyperlipidemia)    no meds, diet controlled   Hypertension    MRSA (methicillin resistant Staphylococcus aureus) 03/02/2005   Obesity    Spinal stenosis     Patient Active Problem List   Diagnosis Date Noted   Mild persistent asthma without complication 52/77/8242   Port-A-Cath in place 08/04/2019   Genetic testing 07/24/2019   Family history of cancer    Dysphagia 06/23/2019   Malignant neoplasm of upper-outer quadrant of right breast in female, estrogen receptor positive ([place]) 06/21/2019    Tobacco consumption 06/06/2019   Breast mass, right 06/06/2019   Sensation of change in body temperature 06/06/2019   Neuropathic pain 06/06/2019   Bite, insect 10/19/2016   Swelling of eyelid, left 10/19/2016   Essential hypertension 08/10/2016   Disturbance of skin sensation 05/08/2013   Cervical spondylosis with myelopathy 05/08/2013   Abnormality of gait 08/02/2012   Chronic arthralgias of knees and hips 08/02/2012    Past Surgical History:  Procedure Laterality Date   ANTERIOR CERVICAL DECOMP/DISCECTOMY FUSION N/A 10/27/2012   Procedure: ANTERIOR CERVICAL DECOMPRESSION/DISCECTOMY FUSION 1 LEVEL;  Surgeon: Faythe Ghee, MD;  Location: [facility];  Service: Neurosurgery;  Laterality: N/A;  ANTERIOR CERVICAL DECOMPRESSION/DISCECTOMY FUSION 1 LEVEL   BREAST LUMPECTOMY WITH RADIOACTIVE SEED AND SENTINEL LYMPH NODE BIOPSY Right 12/13/2019   Procedure: RIGHT BREAST LUMPECTOMY WITH RADIOACTIVE SEED AND RIGHT AXILLARY  SENTINEL LYMPH NODE BIOPSY, RIGHT AXILLARY NODE SEED GUIDED EXCISION, BLUE DYE INJECTION;  Surgeon: Rolm Bookbinder, MD;  Location: [place] [facility];  Service: General;  Laterality: Right;  PEC BLOCK   BREAST REDUCTION SURGERY Bilateral 03/04/2021   Procedure: MAMMARY REDUCTION  (BREAST);  Surgeon: Contogiannis, Audrea Muscat, MD;  Location: [place];  Service: Plastics;  Laterality: Bilateral;   CHALAZION EXCISION  L eye    PORTACATH PLACEMENT Left 07/13/2019   Procedure: INSERTION PORT-A-CATH WITH ULTRASOUND;  Surgeon: Rolm Bookbinder, MD;  Location: [place];  Service: General;  Laterality: Left;   VAGINAL DELIVERY  2002   WISDOM TOOTH EXTRACTION      OB History     Gravida  1   Para      Term      Preterm      AB  0   Living  1      SAB  0   IAB      Ectopic  0   Multiple      Live Births               Home Medications    Prior to Admission medications   Medication Sig Start Date End Date Taking? Authorizing  Provider  acetaminophen (TYLENOL) 500 MG tablet Take 500-1,000 mg by mouth daily as needed for pain.    [provider]  albuterol (VENTOLIN HFA) 108 (90 Base) MCG/ACT inhaler Inhale 2 puffs into the lungs every 6 (six) hours as needed for wheezing or shortness of breath. 09/01/19   Jacelyn Pi, Lilia Argue, MD  amLODipine (NORVASC) 2.5 MG tablet Take 1 tablet (2.5 mg total) by mouth daily. 02/19/21 02/19/22  Minette Brine, FNP  baclofen (LIORESAL) 10 MG tablet Take 1 tablet (10 mg total) by mouth 3 (three) times daily. 09/03/20   Nicholas Lose, MD  letrozole [facility]) 2.5 MG tablet Take 1 tablet (2.5 mg total) by mouth daily. 03/10/21   Nicholas Lose, MD  prochlorperazine (COMPAZINE) 10 MG tablet Take 1 tablet (10 mg total) by mouth every 6 (six) hours as needed (Nausea or vomiting). 07/05/19 11/20/19  Nicholas Lose, MD    Family History Family History  Problem Relation Age of Onset   Osteoarthritis Mother    Hypertension Mother    Diabetes Mother    Hypertension Father    Gout Father    Stroke Father 31       embolic   Diabetes Half-Brother    Hypertension Half-Brother    Heart failure Half-Brother 27   Kidney disease Half-Brother    Diabetes Paternal Grandfather    Cancer Paternal Grandfather 76       breast cancer    Social History Social History   Tobacco Use   Smoking status: Former    Packs/day: 1.00    Years: 10.00    Pack years: 10.00    Types: Cigarettes    Quit date: 06/24/2020    Years since quitting: 0.7   Smokeless tobacco: Never  Vaping Use   Vaping Use: Never used  Substance Use Topics   Alcohol use: Yes    Comment: occas   Drug use: Not Currently    Types: Marijuana    Comment: uses daily - last use 07/12/19     Allergies   Aspirin   Review of Systems Review of Systems  Gastrointestinal:  Positive for vomiting.  As per hpi  Physical Exam Triage Vital Signs ED Triage Vitals  Enc Vitals Group     BP 03/21/21 1754 (!) 164/96     Pulse Rate  03/21/21 1754 (!) 112     Resp 03/21/21 1754 20     Temp 03/21/21 1754 98.6 F (37 C)     Temp Source 03/21/21 1754 Oral     SpO2 03/21/21 1754 99 %     Weight --      Height --  Head Circumference --      Peak Flow --      Pain Score 03/21/21 1755 6     Pain Loc --      Pain Edu? --      Excl. in [place]? --    No data found.  Updated Vital Signs BP (!) 164/96 (BP Location: Left Arm)    Pulse (!) 112    Temp 98.6 F (37 C) (Oral)    Resp 20    LMP  (LMP Unknown) Comment: June 2022   SpO2 99%   Visual Acuity Right Eye Distance:   Left Eye Distance:   Bilateral Distance:    Right Eye Near:   Left Eye Near:    Bilateral Near:     Physical Exam Vitals and nursing note reviewed.  Constitutional:      Appearance: She is not ill-appearing.     Comments: Breath smells heavily of acetone  HENT:     Head: Normocephalic and atraumatic.     Nose: Nose normal.     Mouth/Throat:     Mouth: Mucous membranes are dry.  Cardiovascular:     Rate and Rhythm: Regular rhythm. Tachycardia present.  Skin:    General: Skin is dry.  Neurological:     Mental Status: She is alert and oriented to person, place, and time.  Psychiatric:        Mood and Affect: Mood normal.     UC Treatments / Results  Labs (all labs ordered are listed, but only abnormal results are displayed) Labs Reviewed  CBG MONITORING, ED - Abnormal; Notable for the following components:      Result Value   Glucose-Capillary >600 (*)    All other components within normal limits  POCT URINALYSIS DIPSTICK, ED / UC - Abnormal; Notable for the following components:   Glucose, UA 500 (*)    Ketones, ur 80 (*)    Hgb urine dipstick MODERATE (*)    All other components within normal limits    EKG   Radiology No results found.  Procedures Procedures (including critical care time)  Medications Ordered in UC Medications - No data to display  Initial Impression / Assessment and Plan / UC Course  I have  reviewed the triage vital signs and the nursing notes.  Pertinent labs & imaging results that were available during my care of the patient were reviewed by me and considered in my medical decision making (see chart for details).     DKA vs HHS - EMS called to transport pt to the ER where IV fluids, insulin and labs will need to be obtained.  Final Clinical Impressions(s) / UC Diagnoses   Final diagnoses:  Diabetic ketoacidosis without coma associated with diabetes mellitus due to underlying condition [facility])     Discharge Instructions      Patient with s/sx consistent with DKA vs HHS. Glucose in office >600. Breath smells of acetone. Pt is alert but lethargic therefore EMS was called to transport patient to ER to initiate treatment.     ED Prescriptions   None    PDMP not reviewed this encounter.   Chaney Malling, [place] 03/21/21 1906

## 2021-03-22 ENCOUNTER — Observation Stay (HOSPITAL_COMMUNITY): Payer: BC Managed Care – PPO

## 2021-03-22 ENCOUNTER — Encounter (HOSPITAL_COMMUNITY): Payer: Self-pay | Admitting: Internal Medicine

## 2021-03-22 DIAGNOSIS — Z823 Family history of stroke: Secondary | ICD-10-CM | POA: Diagnosis not present

## 2021-03-22 DIAGNOSIS — H538 Other visual disturbances: Secondary | ICD-10-CM | POA: Diagnosis present

## 2021-03-22 DIAGNOSIS — Z20822 Contact with and (suspected) exposure to covid-19: Secondary | ICD-10-CM | POA: Diagnosis present

## 2021-03-22 DIAGNOSIS — E1110 Type 2 diabetes mellitus with ketoacidosis without coma: Principal | ICD-10-CM

## 2021-03-22 DIAGNOSIS — Z6841 Body Mass Index (BMI) 40.0 and over, adult: Secondary | ICD-10-CM | POA: Diagnosis not present

## 2021-03-22 DIAGNOSIS — E1310 Other specified diabetes mellitus with ketoacidosis without coma: Secondary | ICD-10-CM | POA: Diagnosis not present

## 2021-03-22 DIAGNOSIS — N179 Acute kidney failure, unspecified: Secondary | ICD-10-CM

## 2021-03-22 DIAGNOSIS — E860 Dehydration: Secondary | ICD-10-CM | POA: Diagnosis present

## 2021-03-22 DIAGNOSIS — Z886 Allergy status to analgesic agent status: Secondary | ICD-10-CM | POA: Diagnosis not present

## 2021-03-22 DIAGNOSIS — D75839 Thrombocytosis, unspecified: Secondary | ICD-10-CM

## 2021-03-22 DIAGNOSIS — R350 Frequency of micturition: Secondary | ICD-10-CM | POA: Diagnosis present

## 2021-03-22 DIAGNOSIS — R112 Nausea with vomiting, unspecified: Secondary | ICD-10-CM

## 2021-03-22 DIAGNOSIS — E785 Hyperlipidemia, unspecified: Secondary | ICD-10-CM | POA: Diagnosis present

## 2021-03-22 DIAGNOSIS — Z9221 Personal history of antineoplastic chemotherapy: Secondary | ICD-10-CM | POA: Diagnosis not present

## 2021-03-22 DIAGNOSIS — C50919 Malignant neoplasm of unspecified site of unspecified female breast: Secondary | ICD-10-CM

## 2021-03-22 DIAGNOSIS — I10 Essential (primary) hypertension: Secondary | ICD-10-CM | POA: Diagnosis present

## 2021-03-22 DIAGNOSIS — E876 Hypokalemia: Secondary | ICD-10-CM | POA: Diagnosis present

## 2021-03-22 DIAGNOSIS — Z803 Family history of malignant neoplasm of breast: Secondary | ICD-10-CM | POA: Diagnosis not present

## 2021-03-22 DIAGNOSIS — Z853 Personal history of malignant neoplasm of breast: Secondary | ICD-10-CM | POA: Diagnosis not present

## 2021-03-22 DIAGNOSIS — Z833 Family history of diabetes mellitus: Secondary | ICD-10-CM | POA: Diagnosis not present

## 2021-03-22 DIAGNOSIS — F1210 Cannabis abuse, uncomplicated: Secondary | ICD-10-CM | POA: Diagnosis present

## 2021-03-22 DIAGNOSIS — J45909 Unspecified asthma, uncomplicated: Secondary | ICD-10-CM | POA: Diagnosis present

## 2021-03-22 DIAGNOSIS — Z923 Personal history of irradiation: Secondary | ICD-10-CM | POA: Diagnosis not present

## 2021-03-22 DIAGNOSIS — Z8249 Family history of ischemic heart disease and other diseases of the circulatory system: Secondary | ICD-10-CM | POA: Diagnosis not present

## 2021-03-22 DIAGNOSIS — F419 Anxiety disorder, unspecified: Secondary | ICD-10-CM | POA: Diagnosis present

## 2021-03-22 LAB — GLUCOSE, CAPILLARY
Glucose-Capillary: 285 mg/dL — ABNORMAL HIGH (ref 70–99)
Glucose-Capillary: 371 mg/dL — ABNORMAL HIGH (ref 70–99)

## 2021-03-22 LAB — CBG MONITORING, ED
Glucose-Capillary: 299 mg/dL — ABNORMAL HIGH (ref 70–99)
Glucose-Capillary: 299 mg/dL — ABNORMAL HIGH (ref 70–99)
Glucose-Capillary: 304 mg/dL — ABNORMAL HIGH (ref 70–99)
Glucose-Capillary: 365 mg/dL — ABNORMAL HIGH (ref 70–99)
Glucose-Capillary: 376 mg/dL — ABNORMAL HIGH (ref 70–99)
Glucose-Capillary: 428 mg/dL — ABNORMAL HIGH (ref 70–99)
Glucose-Capillary: 445 mg/dL — ABNORMAL HIGH (ref 70–99)
Glucose-Capillary: 504 mg/dL (ref 70–99)
Glucose-Capillary: 524 mg/dL (ref 70–99)
Glucose-Capillary: >600 mg/dL (ref 70–99)
Glucose-Capillary: >600 mg/dL (ref 70–99)
Glucose-Capillary: >600 mg/dL (ref 70–99)
Glucose-Capillary: >600 mg/dL (ref 70–99)
Glucose-Capillary: >600 mg/dL (ref 70–99)

## 2021-03-22 LAB — BASIC METABOLIC PANEL
Anion gap: 22 — ABNORMAL HIGH (ref 5–15)
Anion gap: 14 (ref 5–15)
Anion gap: 12 (ref 5–15)
BUN: 36 mg/dL — ABNORMAL HIGH (ref 6–20)
BUN: 40 mg/dL — ABNORMAL HIGH (ref 6–20)
BUN: 35 mg/dL — ABNORMAL HIGH (ref 6–20)
CO2: 13 mmol/L — ABNORMAL LOW (ref 22–32)
CO2: 23 mmol/L (ref 22–32)
CO2: 22 mmol/L (ref 22–32)
Calcium: 9.8 mg/dL (ref 8.9–10.3)
Calcium: 9.7 mg/dL (ref 8.9–10.3)
Calcium: 8.9 mg/dL (ref 8.9–10.3)
Chloride: 105 mmol/L (ref 98–111)
Chloride: 108 mmol/L (ref 98–111)
Chloride: 105 mmol/L (ref 98–111)
Creatinine, Ser: 2.15 mg/dL — ABNORMAL HIGH (ref 0.44–1.00)
Creatinine, Ser: 1.62 mg/dL — ABNORMAL HIGH (ref 0.44–1.00)
Creatinine, Ser: 1.34 mg/dL — ABNORMAL HIGH (ref 0.44–1.00)
GFR, Estimated: 29 mL/min — ABNORMAL LOW (ref >60)
GFR, Estimated: 41 mL/min — ABNORMAL LOW (ref >60)
GFR, Estimated: 51 mL/min — ABNORMAL LOW (ref >60)
Glucose, Bld: 712 mg/dL (ref 70–99)
Glucose, Bld: 284 mg/dL — ABNORMAL HIGH (ref 70–99)
Glucose, Bld: 359 mg/dL — ABNORMAL HIGH (ref 70–99)
Potassium: 4.4 mmol/L (ref 3.5–5.1)
Potassium: 3.9 mmol/L (ref 3.5–5.1)
Potassium: 4 mmol/L (ref 3.5–5.1)
Sodium: 140 mmol/L (ref 135–145)
Sodium: 145 mmol/L (ref 135–145)
Sodium: 139 mmol/L (ref 135–145)

## 2021-03-22 LAB — RESP PANEL BY RT-PCR (FLU A&B, COVID) ARPGX2
Influenza A by PCR: NEGATIVE
Influenza B by PCR: NEGATIVE
SARS Coronavirus 2 by RT PCR: NEGATIVE

## 2021-03-22 LAB — HIV ANTIBODY (ROUTINE TESTING W REFLEX): HIV Screen 4th Generation wRfx: NONREACTIVE

## 2021-03-22 LAB — MAGNESIUM: Magnesium: 2.9 mg/dL — ABNORMAL HIGH (ref 1.7–2.4)

## 2021-03-22 LAB — BETA-HYDROXYBUTYRIC ACID: Beta-Hydroxybutyric Acid: 6.98 mmol/L — ABNORMAL HIGH (ref 0.05–0.27)

## 2021-03-22 MED ORDER — POTASSIUM CHLORIDE 10 MEQ/100ML IV SOLN
10.0000 meq | INTRAVENOUS | Status: AC
  Administered 2021-03-22: 04:00:00 10 meq via INTRAVENOUS
  Filled 2021-03-22: qty 100

## 2021-03-22 MED ORDER — LACTATED RINGERS IV SOLN: INTRAVENOUS | Status: DC

## 2021-03-22 MED ORDER — INSULIN ASPART 100 UNIT/ML IJ SOLN: 0.0000 [IU] | Freq: Every day | INTRAMUSCULAR | Status: DC

## 2021-03-22 MED ORDER — LETROZOLE 2.5 MG PO TABS
2.5000 mg | ORAL_TABLET | Freq: Every day | ORAL | Status: DC
  Administered 2021-03-23 – 2021-03-24 (×2): 2.5 mg via ORAL
  Filled 2021-03-22 (×3): qty 1

## 2021-03-22 MED ORDER — ALBUTEROL SULFATE (2.5 MG/3ML) 0.083% IN NEBU: 2.5000 mg | INHALATION_SOLUTION | Freq: Four times a day (QID) | RESPIRATORY_TRACT | Status: DC | PRN

## 2021-03-22 MED ORDER — INSULIN STARTER KIT- PEN NEEDLES (ENGLISH)
1.0000 | Freq: Once | Status: AC
Start: 2021-03-22 — End: 2021-03-22
  Administered 2021-03-22: 22:00:00 1
  Filled 2021-03-22: qty 1

## 2021-03-22 MED ORDER — AMLODIPINE BESYLATE 5 MG PO TABS: 2.5000 mg | ORAL_TABLET | Freq: Every day | ORAL | Status: DC

## 2021-03-22 MED ORDER — INSULIN ASPART 100 UNIT/ML IJ SOLN
4.0000 [IU] | Freq: Three times a day (TID) | INTRAMUSCULAR | Status: DC
  Administered 2021-03-23 – 2021-03-24 (×2): 4 [IU] via SUBCUTANEOUS

## 2021-03-22 MED ORDER — LACTATED RINGERS IV SOLN: INTRAVENOUS | Status: AC

## 2021-03-22 MED ORDER — INSULIN ASPART 100 UNIT/ML IJ SOLN
0.0000 [IU] | Freq: Three times a day (TID) | INTRAMUSCULAR | Status: DC
  Administered 2021-03-22: 19:00:00 11 [IU] via SUBCUTANEOUS
  Administered 2021-03-23: 12:00:00 20 [IU] via SUBCUTANEOUS
  Administered 2021-03-23: 09:00:00 15 [IU] via SUBCUTANEOUS
  Administered 2021-03-23: 18:00:00 3 [IU] via SUBCUTANEOUS
  Administered 2021-03-24: 08:00:00 7 [IU] via SUBCUTANEOUS

## 2021-03-22 MED ORDER — METOPROLOL TARTRATE 50 MG PO TABS
50.0000 mg | ORAL_TABLET | Freq: Two times a day (BID) | ORAL | Status: DC
  Administered 2021-03-22 (×2): 50 mg via ORAL
  Filled 2021-03-22: qty 2
  Filled 2021-03-22 (×2): qty 1

## 2021-03-22 MED ORDER — ENOXAPARIN SODIUM 60 MG/0.6ML IJ SOSY
55.0000 mg | PREFILLED_SYRINGE | INTRAMUSCULAR | Status: DC
  Administered 2021-03-22 – 2021-03-24 (×3): 55 mg via SUBCUTANEOUS
  Filled 2021-03-22 (×2): qty 0.6
  Filled 2021-03-22: qty 0.55

## 2021-03-22 MED ORDER — BACLOFEN 10 MG PO TABS
5.0000 mg | ORAL_TABLET | Freq: Two times a day (BID) | ORAL | Status: DC
  Administered 2021-03-22 – 2021-03-24 (×5): 5 mg via ORAL
  Filled 2021-03-22 (×6): qty 1

## 2021-03-22 MED ORDER — DEXTROSE IN LACTATED RINGERS 5 % IV SOLN: INTRAVENOUS | Status: DC

## 2021-03-22 MED ORDER — INSULIN GLARGINE-YFGN 100 UNIT/ML ~~LOC~~ SOLN
30.0000 [IU] | Freq: Every day | SUBCUTANEOUS | Status: DC
  Administered 2021-03-22: 08:00:00 30 [IU] via SUBCUTANEOUS
  Filled 2021-03-22: qty 0.3

## 2021-03-22 MED ORDER — INSULIN ASPART 100 UNIT/ML IJ SOLN
0.0000 [IU] | Freq: Every day | INTRAMUSCULAR | Status: DC
  Administered 2021-03-22: 22:00:00 5 [IU] via SUBCUTANEOUS

## 2021-03-22 MED ORDER — POTASSIUM CHLORIDE 10 MEQ/100ML IV SOLN
INTRAVENOUS | Status: AC
  Administered 2021-03-22: 06:00:00 10 meq via INTRAVENOUS
  Filled 2021-03-22: qty 100

## 2021-03-22 MED ORDER — INSULIN REGULAR(HUMAN) IN NACL 100-0.9 UT/100ML-% IV SOLN
INTRAVENOUS | Status: DC
  Administered 2021-03-22: 02:00:00 5 [IU]/h via INTRAVENOUS

## 2021-03-22 MED ORDER — LIVING WELL WITH DIABETES BOOK
Freq: Once | Status: AC
  Filled 2021-03-22: qty 1

## 2021-03-22 MED ORDER — STERILE WATER FOR INJECTION IV SOLN
INTRAVENOUS | Status: DC
  Filled 2021-03-22 (×2): qty 1000

## 2021-03-22 MED ORDER — LORAZEPAM 0.5 MG PO TABS
0.5000 mg | ORAL_TABLET | Freq: Two times a day (BID) | ORAL | Status: DC | PRN
  Administered 2021-03-22 – 2021-03-23 (×3): 0.5 mg via ORAL
  Filled 2021-03-22 (×3): qty 1

## 2021-03-22 MED ORDER — INSULIN GLARGINE-YFGN 100 UNIT/ML ~~LOC~~ SOLN
20.0000 [IU] | Freq: Two times a day (BID) | SUBCUTANEOUS | Status: DC
  Administered 2021-03-22: 22:00:00 20 [IU] via SUBCUTANEOUS
  Filled 2021-03-22 (×4): qty 0.2

## 2021-03-22 MED ORDER — DEXTROSE 50 % IV SOLN: 0.0000 mL | INTRAVENOUS | Status: DC | PRN

## 2021-03-22 MED ORDER — INSULIN ASPART 100 UNIT/ML IJ SOLN
3.0000 [IU] | Freq: Three times a day (TID) | INTRAMUSCULAR | Status: DC
  Administered 2021-03-22: 13:00:00 3 [IU] via SUBCUTANEOUS

## 2021-03-22 MED ORDER — ONDANSETRON HCL 4 MG/2ML IJ SOLN: 4.0000 mg | Freq: Four times a day (QID) | INTRAMUSCULAR | Status: DC | PRN

## 2021-03-22 MED ORDER — INSULIN ASPART 100 UNIT/ML IJ SOLN
0.0000 [IU] | Freq: Three times a day (TID) | INTRAMUSCULAR | Status: DC
  Administered 2021-03-22: 13:00:00 9 [IU] via SUBCUTANEOUS

## 2021-03-22 NOTE — Progress Notes (Signed)
PROGRESS NOTE                                                                                                                                                                                                             Patient Demographics:    Heidi Costa, is a 40 y.o. female, DOB - 09/15/1980, GYK:[id number]  Outpatient Primary MD for the patient is Minette Brine, FNP    LOS - 0  Admit date - 03/21/2021    Chief Complaint  Patient presents with   Hyperglycemia       Brief Narrative (HPI from H&P)  LISL Costa is a 40 y.o. female with medical history significant of breast cancer status post chemo/radiation/lumpectomy, asthma, anxiety, depression, arthritis, GERD, hyperlipidemia, hypertension, bilateral breast reduction surgery on 03/04/2021.  She was seen at urgent care today for multiple complaints including fatigue, vomiting, dry mouth, blurred vision, and urinary frequency.  Found to be significantly hyperglycemic with blood glucose above 600 and sent to the ED for further evaluation.  In the ER work-up consistent with DKA, new diagnosis of DM type II, severe dehydration and she was admitted for further care.   Subjective:    Rechelle Niebla today has, No headache, No chest pain, No abdominal pain - No Nausea, No new weakness tingling or numbness, no cough or shortness of breath.   Assessment  & Plan :     DKA in a patient with new DM2  - she is currently on DKA protocol, have added long-acting Lantus, once gap is closed we will transition to sliding scale insulin, continue IV fluids for hydration, diabetic and insulin education.   2.  Dehydration with AKI.  Due to #1 above hydrate  3.  Morbid obesity with BMI of 40.  Follow with PCP.  4.  Marijuana abuse.  Counseled to quit.  5.  Anxiety.  As needed benzodiazepine.  Resume home dose baclofen to prevent withdrawal.  6.  History of asthma.  No acute  issues.      Condition -  Guarded  Family Communication  :  None  Code Status :  Full  Consults  :  DM education  PUD Prophylaxis :     Procedures  :            Disposition Plan  :    Status  is: Observation     DVT Prophylaxis  :  Lovenox    Lab Results  Component Value Date   PLT 562 (H) 03/21/2021    Diet :  Diet Order             Diet Carb Modified Fluid consistency: Thin; Room service appropriate? Yes  Diet effective now                    Inpatient Medications  Scheduled Meds:  baclofen  5 mg Oral BID   enoxaparin (LOVENOX) injection  55 mg Subcutaneous Q24H   insulin glargine-yfgn  30 Units Subcutaneous Daily   letrozole  2.5 mg Oral Daily   metoprolol tartrate  50 mg Oral BID   Continuous Infusions:  dextrose 5% lactated ringers Stopped (03/22/21 0146)   insulin 6 Units/hr (03/22/21 0930)   lactated ringers 125 mL/hr at 03/22/21 0327    sodium bicarbonate (isotonic) infusion in sterile water 125 mL/hr at 03/22/21 0744   PRN Meds:.albuterol, dextrose, LORazepam, ondansetron (ZOFRAN) IV  Antibiotics  :    Anti-infectives (From admission, onward)    None        Time Spent in minutes  30   Lala Lund M.D on 03/22/2021 at 10:33 AM  To page go to [email or web address]   [facility] -  Office  [phone]  See all Orders from today for further details    Objective:   Vitals:   03/22/21 0509 03/22/21 0630 03/22/21 0845 03/22/21 0945  BP: (!) 155/110 (!) 145/116 (!) 132/94 (!) 161/96  Pulse: (!) 101 (!) 128 100 96  Resp: 20 12 18 16   Temp:      TempSrc:      SpO2: 99% 100% 98% 99%    Wt Readings from Last 3 Encounters:  03/10/21 111.4 kg  03/04/21 117.8 kg  02/19/21 117.8 kg     Intake/Output Summary (Last 24 hours) at 03/22/2021 1033 Last data filed at 03/22/2021 0744 Gross per 24 hour  Intake 2520.26 ml  Output --  Net 2520.26 ml     Physical Exam  Awake Alert, No new F.N deficits, Normal  affect [place].AT,PERRAL Supple Neck, No JVD,   Symmetrical Chest wall movement, Good air movement bilaterally, CTAB RRR,No Gallops,Rubs or new Murmurs,  +ve B.Sounds, Abd Soft, No tenderness,   No Cyanosis, Clubbing or edema       Data Review:    CBC Recent Labs  Lab 03/21/21 1941 03/21/21 2005  WBC 10.4  --   HGB 12.5 13.6  HCT 40.2 40.0  PLT 562*  --   MCV 104.1*  --   MCH 32.4  --   MCHC 31.1  --   RDW 14.3  --   LYMPHSABS 0.6*  --   MONOABS 0.5  --   EOSABS 0.0  --   BASOSABS 0.0  --     Electrolytes Recent Labs  Lab 03/21/21 1941 03/21/21 2005 03/22/21 0415  NA 131* 131* 140  K 4.8 5.2* 4.4  CL 96*  --  105  CO2 10*  --  13*  GLUCOSE 1,141*  --  712*  BUN 30*  --  36*  CREATININE 2.20*  --  2.15*  CALCIUM 9.3  --  9.8  AST 20  --   --   ALT 23  --   --   ALKPHOS 105  --   --   BILITOT 1.3*  --   --   ALBUMIN 3.9  --   --  MG 2.9*  --   --     ------------------------------------------------------------------------------------------------------------------ No results for input(s): CHOL, HDL, LDLCALC, TRIG, CHOLHDL, LDLDIRECT in the last 72 hours.  Lab Results  Component Value Date   HGBA1C 7.1 (H) 02/19/2021    No results for input(s): TSH, T4TOTAL, T3FREE, THYROIDAB in the last 72 hours.  Invalid input(s): FREET3 ------------------------------------------------------------------------------------------------------------------ ID Labs Recent Labs  Lab 03/21/21 1941 03/22/21 0415  WBC 10.4  --   PLT 562*  --   CREATININE 2.20* 2.15*   Cardiac Enzymes No results for input(s): CKMB, TROPONINI, MYOGLOBIN in the last 168 hours.  Invalid input(s): CK   Radiology Reports DG Chest 2 View  Result Date: 03/22/2021 CLINICAL DATA:  History of prior breast reduction several days ago with headaches and fatigue, initial encounter EXAM: CHEST - 2 VIEW COMPARISON:  07/13/19 FINDINGS: The heart size and mediastinal contours are within normal limits.  Both lungs are clear. The visualized skeletal structures are unremarkable. Postsurgical changes in the right axilla are noted. Previously seen left chest port has been removed in the interval. Postsurgical changes in the cervical spine are noted. IMPRESSION: No acute abnormality noted. Electronically Signed   By: Inez Catalina M.D.   On: 03/22/2021 03:41

## 2021-03-22 NOTE — ED Notes (Signed)
Pt has calmed down significantly after med admin and RN/NT obtaining hospital bed and switching pt off stretcher onto bed. Pt apologetic for earlier outburst. RN reinforced boundaries and appropriate behavior. Pt received long acting insulin at this time. Dr Candiss Norse called this RN and said that as long as 1100 BMP looks okay pt will be able to transition off the insulin gtt. Pt verbalized understanding of plan of care.

## 2021-03-22 NOTE — ED Notes (Signed)
Pt c/o anxiety, RN sent secure chat to MD asking for PRN

## 2021-03-22 NOTE — Progress Notes (Signed)
Patient transported to unit by wheelchair by transport. Pt transferred via stand-by assist to bed from wheelchair. Pt is oriented to room and call bell left in reach.

## 2021-03-22 NOTE — ED Notes (Signed)
Gave SQ dose of insulin as pt is off drip and has eaten lunch. Pt on LR @ 100 and resting comfortable in bed.

## 2021-03-22 NOTE — H&P (Signed)
History and Physical    Heidi Costa BTD:[id number] DOB: December 21, 1980 DOA: 03/21/2021  PCP: Minette Brine, FNP Patient coming from: Home  Chief Complaint: Multiple complaints  HPI: Heidi Costa is a 40 y.o. female with medical history significant of breast cancer status post chemo/radiation/lumpectomy, asthma, anxiety, depression, arthritis, GERD, hyperlipidemia, hypertension, bilateral breast reduction surgery on 03/04/2021.  She was seen at urgent care today for multiple complaints including fatigue, vomiting, dry mouth, blurred vision, and urinary frequency.  Found to be significantly hyperglycemic with blood glucose above 600 and sent to the ED for further evaluation.  In the ED, tachycardic with heart rate in the 110s.  Not febrile.  No significant leukocytosis on labs.  Hemoglobin normal.  Platelet count 562k.  Sodium 131.  Blood glucose 1141.  Bicarb 10, anion gap 25.  UA with ketones.  Beta hydroxybutyric acid >8.0.  VBG with pH 7.27.  BUN 30, creatinine 2.2 (baseline 0.7-0.8).  T bili 1.3.  AST, ALT, and alk phos within normal range.  COVID and influenza PCR pending. Patient was started on IV insulin and given 2 L normal saline boluses.  Patient states she had breast reduction surgery in December 6 and was prescribed a steroid which she finished taking on December 12.  Since after she finished the steroid, she started having extreme fatigue, dry mouth, polydipsia, and polyuria.  She is also having a hacking cough and has continued to vomit.  Not able to eat much.  Denies abdominal pain or diarrhea.  She does report using marijuana.  She denies history of diabetes.  No other complaints.  Denies fevers, shortness of breath, or chest pain.  She is vaccinated against COVID and reports negative COVID test at home today.  Review of Systems:  All systems reviewed and apart from history of presenting illness, are negative.  Past Medical History:  Diagnosis Date   Abnormality of gait  08/02/2012   resolved per patient on 07/12/19   Anemia    iron taking in the past, not currently   Anxiety    Arthritis    spine- cerv.  & arm    Asthma    Bulging lumbar disc    Cancer ([place])    Right Breast-starts chemo on 07/14/19   Chronic arthralgias of knees and hips 08/02/2012   Depression    Pt. took self off Celexa one month ago, states she did n't like how it made her feel .   Family history of cancer    GERD (gastroesophageal reflux disease)    diet controlled   Heart murmur    pt. been told that this is a fact, no echo or cardiac surveillance in the past    HLD (hyperlipidemia)    no meds, diet controlled   Hypertension    MRSA (methicillin resistant Staphylococcus aureus) 03/02/2005   Obesity    Spinal stenosis     Past Surgical History:  Procedure Laterality Date   ANTERIOR CERVICAL DECOMP/DISCECTOMY FUSION N/A 10/27/2012   Procedure: ANTERIOR CERVICAL DECOMPRESSION/DISCECTOMY FUSION 1 LEVEL;  Surgeon: Faythe Ghee, MD;  Location: [facility];  Service: Neurosurgery;  Laterality: N/A;  ANTERIOR CERVICAL DECOMPRESSION/DISCECTOMY FUSION 1 LEVEL   BREAST LUMPECTOMY WITH RADIOACTIVE SEED AND SENTINEL LYMPH NODE BIOPSY Right 12/13/2019   Procedure: RIGHT BREAST LUMPECTOMY WITH RADIOACTIVE SEED AND RIGHT AXILLARY  SENTINEL LYMPH NODE BIOPSY, RIGHT AXILLARY NODE SEED GUIDED EXCISION, BLUE DYE INJECTION;  Surgeon: Rolm Bookbinder, MD;  Location: [facility];  Service: General;  Laterality: Right;  PEC BLOCK   BREAST REDUCTION SURGERY Bilateral 03/04/2021   Procedure: MAMMARY REDUCTION  (BREAST);  Surgeon: Contogiannis, Audrea Muscat, MD;  Location: [place];  Service: Plastics;  Laterality: Bilateral;   CHALAZION EXCISION     L eye    PORTACATH PLACEMENT Left 07/13/2019   Procedure: INSERTION PORT-A-CATH WITH ULTRASOUND;  Surgeon: Rolm Bookbinder, MD;  Location: [place];  Service: General;  Laterality: Left;   VAGINAL DELIVERY  2002   WISDOM  TOOTH EXTRACTION       reports that she quit smoking about 8 months ago. Her smoking use included cigarettes. She has a 10.00 pack-year smoking history. She has never used smokeless tobacco. She reports current alcohol use. She reports that she does not currently use drugs after having used the following drugs: Marijuana.  Allergies  Allergen Reactions   Aspirin Rash    Family History  Problem Relation Age of Onset   Osteoarthritis Mother    Hypertension Mother    Diabetes Mother    Hypertension Father    Gout Father    Stroke Father 62       embolic   Diabetes Half-Brother    Hypertension Half-Brother    Heart failure Half-Brother 47   Kidney disease Half-Brother    Diabetes Paternal Grandfather    Cancer Paternal Grandfather 37       breast cancer    Prior to Admission medications   Medication Sig Start Date End Date Taking? Authorizing Provider  acetaminophen (TYLENOL) 500 MG tablet Take 500-1,000 mg by mouth daily as needed for pain.   Yes [provider]  albuterol (VENTOLIN HFA) 108 (90 Base) MCG/ACT inhaler Inhale 2 puffs into the lungs every 6 (six) hours as needed for wheezing or shortness of breath. 09/01/19  Yes Jacelyn Pi, Irma M, MD  amLODipine (NORVASC) 2.5 MG tablet Take 1 tablet (2.5 mg total) by mouth daily. 02/19/21 02/19/22 Yes Minette Brine, FNP  baclofen (LIORESAL) 10 MG tablet Take 1 tablet (10 mg total) by mouth 3 (three) times daily. 09/03/20  Yes Nicholas Lose, MD  letrozole [facility]) 2.5 MG tablet Take 1 tablet (2.5 mg total) by mouth daily. 03/10/21  Yes Nicholas Lose, MD  oxyCODONE-acetaminophen (PERCOCET/ROXICET) 5-325 MG tablet Take 1-2 tablets by mouth every 4 (four) hours as needed. 03/10/21  Yes [provider]  prochlorperazine (COMPAZINE) 10 MG tablet Take 1 tablet (10 mg total) by mouth every 6 (six) hours as needed (Nausea or vomiting). 07/05/19 11/20/19  Nicholas Lose, MD    Physical Exam: Vitals:   03/21/21 1942 03/21/21  1945 03/21/21 2239  BP: (!) 152/99 (!) 147/67 (!) 139/110  Pulse: (!) 110 (!) 105 95  Resp: (!) 23 (!) 25 18  Temp: 98.2 F (36.8 C)    TempSrc: Oral    SpO2: 100% 100% 100%    Physical Exam Constitutional:      General: She is not in acute distress. HENT:     Head: Normocephalic and atraumatic.     Mouth/Throat:     Mouth: Mucous membranes are dry.  Eyes:     Extraocular Movements: Extraocular movements intact.     Conjunctiva/sclera: Conjunctivae normal.  Cardiovascular:     Rate and Rhythm: Normal rate and regular rhythm.     Pulses: Normal pulses.  Pulmonary:     Effort: Pulmonary effort is normal. No respiratory distress.     Breath sounds: Normal breath sounds. No wheezing or rales.  Abdominal:  General: Bowel sounds are normal. There is no distension.     Palpations: Abdomen is soft.     Tenderness: There is no abdominal tenderness. There is no guarding or rebound.  Musculoskeletal:        General: No swelling or tenderness.     Cervical back: Normal range of motion and neck supple.  Skin:    General: Skin is warm and dry.  Neurological:     General: No focal deficit present.     Mental Status: She is alert and oriented to person, place, and time.     Labs on Admission: I have personally reviewed following labs and imaging studies  CBC: Recent Labs  Lab 03/21/21 1941 03/21/21 2005  WBC 10.4  --   NEUTROABS 9.2*  --   HGB 12.5 13.6  HCT 40.2 40.0  MCV 104.1*  --   PLT 562*  --    Basic Metabolic Panel: Recent Labs  Lab 03/21/21 1941 03/21/21 2005  NA 131* 131*  K 4.8 5.2*  CL 96*  --   CO2 10*  --   GLUCOSE 1,141*  --   BUN 30*  --   CREATININE 2.20*  --   CALCIUM 9.3  --   MG 2.9*  --    GFR: Estimated Creatinine Clearance: 41.9 mL/min (A) (by C-G formula based on SCr of 2.2 mg/dL (H)). Liver Function Tests: Recent Labs  Lab 03/21/21 1941  AST 20  ALT 23  ALKPHOS 105  BILITOT 1.3*  PROT 7.8  ALBUMIN 3.9   No results for  input(s): LIPASE, AMYLASE in the last 168 hours. No results for input(s): AMMONIA in the last 168 hours. Coagulation Profile: No results for input(s): INR, PROTIME in the last 168 hours. Cardiac Enzymes: No results for input(s): CKTOTAL, CKMB, CKMBINDEX, TROPONINI in the last 168 hours. BNP (last 3 results) No results for input(s): PROBNP in the last 8760 hours. HbA1C: No results for input(s): HGBA1C in the last 72 hours. CBG: Recent Labs  Lab 03/21/21 1843 03/22/21 0001 03/22/21 0107  GLUCAP >600* >600* >600*   Lipid Profile: No results for input(s): CHOL, HDL, LDLCALC, TRIG, CHOLHDL, LDLDIRECT in the last 72 hours. Thyroid Function Tests: No results for input(s): TSH, T4TOTAL, FREET4, T3FREE, THYROIDAB in the last 72 hours. Anemia Panel: No results for input(s): VITAMINB12, FOLATE, FERRITIN, TIBC, IRON, RETICCTPCT in the last 72 hours. Urine analysis:    Component Value Date/Time   COLORURINE YELLOW 03/21/2021 2012   APPEARANCEUR CLEAR 03/21/2021 2012   LABSPEC 1.015 03/21/2021 2012   PHURINE 5.5 03/21/2021 2012   GLUCOSEU >=500 (A) 03/21/2021 2012   GLUCOSEU NEGATIVE 08/10/2016 0900   HGBUR MODERATE (A) 03/21/2021 2012   BILIRUBINUR NEGATIVE 03/21/2021 2012   BILIRUBINUR negative 02/19/2021 1108   KETONESUR >80 (A) 03/21/2021 2012   PROTEINUR NEGATIVE 03/21/2021 2012   UROBILINOGEN 0.2 03/21/2021 1846   NITRITE NEGATIVE 03/21/2021 2012   LEUKOCYTESUR NEGATIVE 03/21/2021 2012    Radiological Exams on Admission: No results found.  Assessment/Plan Principal Problem:   DKA (diabetic ketoacidosis) ([place]) Active Problems:   Intractable nausea and vomiting   AKI (acute kidney injury) ([place])   Breast cancer ([facility])   Thrombocytosis   Severe DKA in the setting of new onset type 2 diabetes She is not on treatment for diabetes.  Labs done a month ago showing A1c 7.1.  DKA likely precipitated by recent steroid use. Blood glucose significantly elevated at 1141.  Bicarb 10,  anion gap 25.  UA with ketones.  Beta hydroxybutyric acid >8.0.  VBG with pH 7.27.  Patient is awake, alert, and oriented. -Keep n.p.o. Continue IV insulin and IV fluids per DKA protocol.  Start bicarb infusion.  Monitor BMP every 4 hours.  When DKA resolves and able to tolerate p.o. intake, initiate subcutaneous insulin and diet.  Continue IV insulin for an additional 1 to 2 hours to ensure adequate plasma insulin levels.  Consult diabetes coordinator.  Intractable nausea and vomiting Likely multifactorial from DKA and marijuana use.  Patient is vaccinated against COVID and reports negative home COVID test today.  Not endorsing any abdominal pain.  No significant elevation of LFTs or leukocytosis. Abdominal exam benign. -Antiemetic as needed.  COVID and influenza PCR pending.  Substance abuse Reports using marijuana. -Counseled to quit  AKI Likely prerenal azotemia from dehydration. BUN 30, creatinine 2.2 (baseline 0.7-0.8).  -IV fluid hydration.  Monitor renal function and urine output.  Avoid nephrotoxic agents.  Cough She is vaccinated against COVID and reports negative home COVID test today.  Lungs clear on exam and not hypoxic. -Chest x-ray.  COVID and influenza PCR pending.  Mild thrombocytosis -Continue to monitor  History of breast cancer -Continue letrozole.  Outpatient oncology follow-up  Asthma No signs of acute exacerbation. -Albuterol inhaler as needed  Hypertension -Continue amlodipine  DVT prophylaxis: Lovenox Code Status: Full code Family Communication: Patient's female friend at bedside. Disposition Plan: Status is: Observation  The patient remains OBS appropriate and will d/c before 2 midnights.  Level of care: Level of care: Progressive  The medical decision making on this patient was of high complexity and the patient is at high risk for clinical deterioration, therefore this is a level 3 visit.  Shela Leff MD [facility]  If 7PM-7AM,  please contact night-coverage [email or web address]  03/22/2021, 1:27 AM

## 2021-03-22 NOTE — ED Notes (Signed)
Pt now screaming and cursing at staff, slammed her tray table when RN came to get blood sugar.

## 2021-03-22 NOTE — Progress Notes (Addendum)
Inpatient Diabetes Program Recommendations  AACE/ADA: New Consensus Statement on Inpatient Glycemic Control (2015)  Target Ranges:  Prepandial:   less than 140 mg/dL      Peak postprandial:   less than 180 mg/dL (1-2 hours)      Critically ill patients:  140 - 180 mg/dL   Lab Results  Component Value Date   GLUCAP 365 (H) 03/22/2021   HGBA1C 7.1 (H) 02/19/2021    Review of Glycemic Control  Latest Reference Range & Units 03/21/21 20:06    Beta-Hydroxybutyric Acid 0.05 - 0.27 mmol/L >8.00 (H)  (H): Data is abnormally high  Latest Reference Range & Units 03/21/21 19:41  Glucose 70 - 99 mg/dL 1,141 (HH)  (HH): Data is critically high    Latest Reference Range & Units 03/21/21 19:41  Anion gap 5 - 15  25 (H)  (H): Data is abnormally high  Diabetes history: New DM2 Outpatient Diabetes medications: None Current orders for Inpatient glycemic control: Semglee 20 units BID, Novolog 0-20 units TID and 0-5 units QHS, Novolog 4 units TID with meals  Received consult for new DM and DM teaching.  Spoke with patient ove rthe phone today as this DM coordinator is working remotely.  Spoke with pt about new diagnosis. Discussed A1C results with her and explained what an A1C is, basic pathophysiology of DM Type 2, basic home care, basic diabetes diet nutrition principles, importance of checking CBGs and maintaining good CBG control to prevent long-term and short-term complications. Reviewed signs and symptoms of hyperglycemia and hypoglycemia and how to treat hypoglycemia at home. Also reviewed blood sugar goals at home.   Patient states she finished a steroid taper on December 12th.  She began having frequent urination, thirst and weight loss.  Her A1C was 7.1% on 02/19/21 at a new PCP appointment.  She read the results in my chart and thought the PCP would call her but never did.    She drinks regular soda and mostly juices.  Educated her on The Plate Method, CHO's, potion control and importance  of eliminating beverages with sugar.  Provided recommendations for non-caloric beverages.  Educated patient on basal insulin, rapid insulin, insulin pen administration, hypoglycemia, signs, symptoms and treatment.  Asked her to look up insulin pen administration-[facility] on You Tube.  This is an excellent video on how to administer insulin using a pen.    RNs to provide ongoing basic DM education at bedside with this patient. Have ordered educational booklet, insulin starter kit, and DM videos. Have also placed RD consult for DM diet education for this patient.   Please use each patient interaction to provide diabetes education. Please review Living Well with Diabetes booklet with the patient, have patient watch patient education videos on diabetes, and instruct on insulin administration. Please allow patient to be actively engaged with diabetes management by allowing patient to check own glucose and self-administer insulin injections. Diabetes Coordinator will follow up with patient and reinforce diabetes education.  Order numbers for DC: [id number] Lantus Solostar insulin [id number] Novolog [id number] Insulin pen [phone]  Will continue to follow while inpatient.  Thank you, Reche Dixon, RN, BSN Diabetes Coordinator Inpatient Diabetes Program [phone] (team pager from 8a-5p)

## 2021-03-22 NOTE — ED Notes (Signed)
Dr. Candiss Norse tells this RN to d/c insulin gtt and all fluids except LR. Insulin gtt discontinued at this time.

## 2021-03-23 LAB — COMPREHENSIVE METABOLIC PANEL
ALT: 22 U/L (ref 0–44)
AST: 37 U/L (ref 15–41)
Albumin: 3.3 g/dL — ABNORMAL LOW (ref 3.5–5.0)
Alkaline Phosphatase: 78 U/L (ref 38–126)
Anion gap: 12 (ref 5–15)
BUN: 29 mg/dL — ABNORMAL HIGH (ref 6–20)
CO2: 26 mmol/L (ref 22–32)
Calcium: 9.1 mg/dL (ref 8.9–10.3)
Chloride: 106 mmol/L (ref 98–111)
Creatinine, Ser: 1.12 mg/dL — ABNORMAL HIGH (ref 0.44–1.00)
GFR, Estimated: >60 mL/min (ref >60)
Glucose, Bld: 258 mg/dL — ABNORMAL HIGH (ref 70–99)
Potassium: 3.5 mmol/L (ref 3.5–5.1)
Sodium: 144 mmol/L (ref 135–145)
Total Bilirubin: 0.7 mg/dL (ref 0.3–1.2)
Total Protein: 6.7 g/dL (ref 6.5–8.1)

## 2021-03-23 LAB — GLUCOSE, CAPILLARY
Glucose-Capillary: 303 mg/dL — ABNORMAL HIGH (ref 70–99)
Glucose-Capillary: 372 mg/dL — ABNORMAL HIGH (ref 70–99)
Glucose-Capillary: 144 mg/dL — ABNORMAL HIGH (ref 70–99)
Glucose-Capillary: 139 mg/dL — ABNORMAL HIGH (ref 70–99)

## 2021-03-23 LAB — CBC WITH DIFFERENTIAL/PLATELET
Abs Immature Granulocytes: 0.04 10*3/uL (ref 0.00–0.07)
Basophils Absolute: 0 10*3/uL (ref 0.0–0.1)
Basophils Relative: 0 %
Eosinophils Absolute: 0 10*3/uL (ref 0.0–0.5)
Eosinophils Relative: 0 %
HCT: 33.2 % — ABNORMAL LOW (ref 36.0–46.0)
Hemoglobin: 11.4 g/dL — ABNORMAL LOW (ref 12.0–15.0)
Immature Granulocytes: 0 %
Lymphocytes Relative: 15 %
Lymphs Abs: 1.4 10*3/uL (ref 0.7–4.0)
MCH: 33 pg (ref 26.0–34.0)
MCHC: 34.3 g/dL (ref 30.0–36.0)
MCV: 96.2 fL (ref 80.0–100.0)
Monocytes Absolute: 0.8 10*3/uL (ref 0.1–1.0)
Monocytes Relative: 8 %
Neutro Abs: 7.6 10*3/uL (ref 1.7–7.7)
Neutrophils Relative %: 77 %
Platelets: 375 10*3/uL (ref 150–400)
RBC: 3.45 MIL/uL — ABNORMAL LOW (ref 3.87–5.11)
RDW: 14.1 % (ref 11.5–15.5)
WBC: 9.8 10*3/uL (ref 4.0–10.5)
nRBC: 0 % (ref 0.0–0.2)

## 2021-03-23 LAB — MRSA NEXT GEN BY PCR, NASAL: MRSA by PCR Next Gen: NOT DETECTED

## 2021-03-23 LAB — BRAIN NATRIURETIC PEPTIDE: B Natriuretic Peptide: 31.1 pg/mL (ref 0.0–100.0)

## 2021-03-23 LAB — HEMOGLOBIN A1C
Hgb A1c MFr Bld: 10.5 % — ABNORMAL HIGH (ref 4.8–5.6)
Mean Plasma Glucose: 254.65 mg/dL

## 2021-03-23 LAB — MAGNESIUM: Magnesium: 2.7 mg/dL — ABNORMAL HIGH (ref 1.7–2.4)

## 2021-03-23 MED ORDER — ACETAMINOPHEN 325 MG PO TABS
650.0000 mg | ORAL_TABLET | Freq: Four times a day (QID) | ORAL | Status: DC | PRN
  Administered 2021-03-23: 22:00:00 650 mg via ORAL
  Filled 2021-03-23: qty 2

## 2021-03-23 MED ORDER — INSULIN GLARGINE-YFGN 100 UNIT/ML ~~LOC~~ SOLN
35.0000 [IU] | Freq: Two times a day (BID) | SUBCUTANEOUS | Status: DC
  Administered 2021-03-23 – 2021-03-24 (×3): 35 [IU] via SUBCUTANEOUS
  Filled 2021-03-23 (×4): qty 0.35

## 2021-03-23 MED ORDER — CARVEDILOL 6.25 MG PO TABS
6.2500 mg | ORAL_TABLET | Freq: Two times a day (BID) | ORAL | Status: DC
  Administered 2021-03-23 – 2021-03-24 (×3): 6.25 mg via ORAL
  Filled 2021-03-23 (×3): qty 1

## 2021-03-23 MED ORDER — POTASSIUM CHLORIDE CRYS ER 20 MEQ PO TBCR
40.0000 meq | EXTENDED_RELEASE_TABLET | Freq: Once | ORAL | Status: AC
  Administered 2021-03-23: 09:00:00 40 meq via ORAL
  Filled 2021-03-23: qty 2

## 2021-03-23 MED ORDER — INSULIN GLARGINE-YFGN 100 UNIT/ML ~~LOC~~ SOLN
30.0000 [IU] | Freq: Two times a day (BID) | SUBCUTANEOUS | Status: DC
Start: 2021-03-23 — End: 2021-03-23

## 2021-03-23 NOTE — Progress Notes (Signed)
PROGRESS NOTE                                                                                                                                                                                                             Patient Demographics:    Heidi Costa, is a 40 y.o. female, DOB - 06-26-1980, SJG:[id number]  Outpatient Primary MD for the patient is Minette Brine, FNP    LOS - 1  Admit date - 03/21/2021    Chief Complaint  Patient presents with   Hyperglycemia       Brief Narrative (HPI from H&P)  Heidi Costa is a 40 y.o. female with medical history significant of breast cancer status post chemo/radiation/lumpectomy, asthma, anxiety, depression, arthritis, GERD, hyperlipidemia, hypertension, bilateral breast reduction surgery on 03/04/2021.  She was seen at urgent care today for multiple complaints including fatigue, vomiting, dry mouth, blurred vision, and urinary frequency.  Found to be significantly hyperglycemic with blood glucose above 600 and sent to the ED for further evaluation.  In the ER work-up consistent with DKA, new diagnosis of DM type II, severe dehydration and she was admitted for further care.   Subjective:   Patient in bed, appears comfortable, denies any headache, no fever, no chest pain or pressure, no shortness of breath , no abdominal pain. No new focal weakness.   Assessment  & Plan :     DKA in a patient with new DM2  - she was treated with DKA protocol, DKA has resolved, have placed her on Lantus, sliding scale along with Premeal NovoLog, dose adjusted on 03/23/2021, diabetic and insulin education requested and pending, continue to monitor.  Lab Results  Component Value Date   HGBA1C 7.1 (H) 02/19/2021    CBG (last 3)  Recent Labs    03/22/21 1808 03/22/21 2026 03/23/21 0754  GLUCAP 285* 371* 303*    2.  Dehydration with AKI.  Due to #1 above hydrate  3.  Morbid obesity with  BMI of 40.  Follow with PCP.  4.  Marijuana abuse.  Counseled to quit.  5.  Anxiety.  As needed benzodiazepine.  Resume home dose baclofen to prevent withdrawal.  6.  History of asthma.  No acute issues.      Condition -  Guarded  Family Communication  :  None  Code Status :  Full  Consults  :  DM education  PUD Prophylaxis :     Procedures  :            Disposition Plan  :    Status is: Observation     DVT Prophylaxis  :  Lovenox    Lab Results  Component Value Date   PLT 375 03/23/2021    Diet :  Diet Order             Diet Carb Modified Fluid consistency: Thin; Room service appropriate? Yes  Diet effective now                    Inpatient Medications  Scheduled Meds:  baclofen  5 mg Oral BID   carvedilol  6.25 mg Oral BID WC   enoxaparin (LOVENOX) injection  55 mg Subcutaneous Q24H   insulin aspart  0-20 Units Subcutaneous TID WC   insulin aspart  0-5 Units Subcutaneous QHS   insulin aspart  4 Units Subcutaneous TID WC   insulin glargine-yfgn  35 Units Subcutaneous BID   letrozole  2.5 mg Oral Daily   Continuous Infusions:  lactated ringers 100 mL/hr at 03/23/21 0413   PRN Meds:.albuterol, dextrose, LORazepam, ondansetron (ZOFRAN) IV  Antibiotics  :    Anti-infectives (From admission, onward)    None        Time Spent in minutes  30   Lala Lund M.D on 03/23/2021 at 10:38 AM  To page go to [email or web address]   [facility] -  Office  [phone]  See all Orders from today for further details    Objective:   Vitals:   03/23/21 0010 03/23/21 0015 03/23/21 0400 03/23/21 0750  BP: 130/86  (!) 149/87 133/72  Pulse: 88  90 80  Resp: 12  18 16   Temp: 97.9 F (36.6 C)  98.8 F (37.1 C) 98.8 F (37.1 C)  TempSrc: Oral  Axillary Oral  SpO2: 98%  98% 97%  Weight:  105.4 kg    Height:        Wt Readings from Last 3 Encounters:  03/23/21 105.4 kg  03/10/21 111.4 kg  03/04/21 117.8 kg     Intake/Output  Summary (Last 24 hours) at 03/23/2021 1038 Last data filed at 03/23/2021 0900 Gross per 24 hour  Intake 3557.78 ml  Output 300 ml  Net 3257.78 ml     Physical Exam  Awake Alert, No new F.N deficits, Normal affect [place].AT,PERRAL Supple Neck, No JVD,   Symmetrical Chest wall movement, Good air movement bilaterally, CTAB RRR,No Gallops, Rubs or new Murmurs,  +ve B.Sounds, Abd Soft, No tenderness,   No Cyanosis, Clubbing or edema     Data Review:    CBC Recent Labs  Lab 03/21/21 1941 03/21/21 2005 03/23/21 0559  WBC 10.4  --  9.8  HGB 12.5 13.6 11.4*  HCT 40.2 40.0 33.2*  PLT 562*  --  375  MCV 104.1*  --  96.2  MCH 32.4  --  33.0  MCHC 31.1  --  34.3  RDW 14.3  --  14.1  LYMPHSABS 0.6*  --  1.4  MONOABS 0.5  --  0.8  EOSABS 0.0  --  0.0  BASOSABS 0.0  --  0.0    Electrolytes Recent Labs  Lab 03/21/21 1941 03/21/21 2005 03/22/21 0415 03/22/21 1101 03/22/21 1837 03/22/21 2352  NA 131* 131* 140 145 139 144  K 4.8 5.2* 4.4 3.9 4.0 3.5  CL 96*  --  105 108 105 106  CO2 10*  --  13* 23 22 26   GLUCOSE 1,141*  --  712* 284* 359* 258*  BUN 30*  --  36* 40* 35* 29*  CREATININE 2.20*  --  2.15* 1.62* 1.34* 1.12*  CALCIUM 9.3  --  9.8 9.7 8.9 9.1  AST 20  --   --   --   --  37  ALT 23  --   --   --   --  22  ALKPHOS 105  --   --   --   --  78  BILITOT 1.3*  --   --   --   --  0.7  ALBUMIN 3.9  --   --   --   --  3.3*  MG 2.9*  --   --  2.9*  --  2.7*  BNP  --   --   --   --   --  31.1    ------------------------------------------------------------------------------------------------------------------ No results for input(s): CHOL, HDL, LDLCALC, TRIG, CHOLHDL, LDLDIRECT in the last 72 hours.  Lab Results  Component Value Date   HGBA1C 7.1 (H) 02/19/2021    No results for input(s): TSH, T4TOTAL, T3FREE, THYROIDAB in the last 72 hours.  Invalid input(s):  FREET3 ------------------------------------------------------------------------------------------------------------------ ID Labs Recent Labs  Lab 03/21/21 1941 03/22/21 0415 03/22/21 1101 03/22/21 1837 03/22/21 2352 03/23/21 0559  WBC 10.4  --   --   --   --  9.8  PLT 562*  --   --   --   --  375  CREATININE 2.20* 2.15* 1.62* 1.34* 1.12*  --    Cardiac Enzymes No results for input(s): CKMB, TROPONINI, MYOGLOBIN in the last 168 hours.  Invalid input(s): CK   Radiology Reports DG Chest 2 View  Result Date: 03/22/2021 CLINICAL DATA:  History of prior breast reduction several days ago with headaches and fatigue, initial encounter EXAM: CHEST - 2 VIEW COMPARISON:  07/13/19 FINDINGS: The heart size and mediastinal contours are within normal limits. Both lungs are clear. The visualized skeletal structures are unremarkable. Postsurgical changes in the right axilla are noted. Previously seen left chest port has been removed in the interval. Postsurgical changes in the cervical spine are noted. IMPRESSION: No acute abnormality noted. Electronically Signed   By: Inez Catalina M.D.   On: 03/22/2021 03:41

## 2021-03-23 NOTE — Plan of Care (Signed)

## 2021-03-23 NOTE — Care Management (Signed)
°  Transition of Care (TOC) Screening Note   Patient Details  Name: Heidi Costa Date of Birth: June 09, 1980   Transition of Care [facility]) CM/SW Contact:    Carles Collet, RN Phone Number: 03/23/2021, 9:17 AM    Transition of Care Department [facility]) has reviewed patient and no TOC needs have been identified at this time. We will continue to monitor patient advancement through interdisciplinary progression rounds. If new patient transition needs arise, please place a TOC consult.

## 2021-03-24 LAB — COMPREHENSIVE METABOLIC PANEL
ALT: 25 U/L (ref 0–44)
AST: 43 U/L — ABNORMAL HIGH (ref 15–41)
Albumin: 2.9 g/dL — ABNORMAL LOW (ref 3.5–5.0)
Alkaline Phosphatase: 64 U/L (ref 38–126)
Anion gap: 11 (ref 5–15)
BUN: 17 mg/dL (ref 6–20)
CO2: 28 mmol/L (ref 22–32)
Calcium: 8.8 mg/dL — ABNORMAL LOW (ref 8.9–10.3)
Chloride: 104 mmol/L (ref 98–111)
Creatinine, Ser: 0.85 mg/dL (ref 0.44–1.00)
GFR, Estimated: >60 mL/min (ref >60)
Glucose, Bld: 180 mg/dL — ABNORMAL HIGH (ref 70–99)
Potassium: 2.9 mmol/L — ABNORMAL LOW (ref 3.5–5.1)
Sodium: 143 mmol/L (ref 135–145)
Total Bilirubin: 0.6 mg/dL (ref 0.3–1.2)
Total Protein: 6 g/dL — ABNORMAL LOW (ref 6.5–8.1)

## 2021-03-24 LAB — CBC WITH DIFFERENTIAL/PLATELET
Abs Immature Granulocytes: 0.01 10*3/uL (ref 0.00–0.07)
Basophils Absolute: 0 10*3/uL (ref 0.0–0.1)
Basophils Relative: 0 %
Eosinophils Absolute: 0.1 10*3/uL (ref 0.0–0.5)
Eosinophils Relative: 1 %
HCT: 31.3 % — ABNORMAL LOW (ref 36.0–46.0)
Hemoglobin: 10.7 g/dL — ABNORMAL LOW (ref 12.0–15.0)
Immature Granulocytes: 0 %
Lymphocytes Relative: 40 %
Lymphs Abs: 2.1 10*3/uL (ref 0.7–4.0)
MCH: 32.9 pg (ref 26.0–34.0)
MCHC: 34.2 g/dL (ref 30.0–36.0)
MCV: 96.3 fL (ref 80.0–100.0)
Monocytes Absolute: 0.4 10*3/uL (ref 0.1–1.0)
Monocytes Relative: 8 %
Neutro Abs: 2.7 10*3/uL (ref 1.7–7.7)
Neutrophils Relative %: 51 %
Platelets: 319 10*3/uL (ref 150–400)
RBC: 3.25 MIL/uL — ABNORMAL LOW (ref 3.87–5.11)
RDW: 13.5 % (ref 11.5–15.5)
WBC: 5.3 10*3/uL (ref 4.0–10.5)
nRBC: 0 % (ref 0.0–0.2)

## 2021-03-24 LAB — MAGNESIUM: Magnesium: 2.5 mg/dL — ABNORMAL HIGH (ref 1.7–2.4)

## 2021-03-24 LAB — GLUCOSE, CAPILLARY
Glucose-Capillary: 231 mg/dL — ABNORMAL HIGH (ref 70–99)
Glucose-Capillary: 188 mg/dL — ABNORMAL HIGH (ref 70–99)

## 2021-03-24 LAB — BRAIN NATRIURETIC PEPTIDE: B Natriuretic Peptide: 6 pg/mL (ref 0.0–100.0)

## 2021-03-24 MED ORDER — POTASSIUM CHLORIDE CRYS ER 20 MEQ PO TBCR
40.0000 meq | EXTENDED_RELEASE_TABLET | Freq: Once | ORAL | Status: AC
  Administered 2021-03-24: 07:00:00 40 meq via ORAL
  Filled 2021-03-24: qty 2

## 2021-03-24 MED ORDER — INSULIN ASPART 100 UNIT/ML FLEXPEN: PEN_INJECTOR | SUBCUTANEOUS | 0 refills | Status: DC

## 2021-03-24 MED ORDER — LANTUS SOLOSTAR 100 UNIT/ML ~~LOC~~ SOPN: 40.0000 [IU] | PEN_INJECTOR | Freq: Two times a day (BID) | SUBCUTANEOUS | 0 refills | Status: DC

## 2021-03-24 MED ORDER — LIVING WELL WITH DIABETES BOOK
Freq: Once | Status: AC
  Filled 2021-03-24 (×2): qty 1

## 2021-03-24 MED ORDER — POTASSIUM CHLORIDE 10 MEQ/100ML IV SOLN
10.0000 meq | INTRAVENOUS | Status: AC
  Administered 2021-03-24 (×4): 10 meq via INTRAVENOUS
  Filled 2021-03-24 (×4): qty 100

## 2021-03-24 MED ORDER — LISINOPRIL 5 MG PO TABS
5.0000 mg | ORAL_TABLET | Freq: Every day | ORAL | 0 refills | Status: DC
Start: 2021-03-24 — End: 2021-08-19

## 2021-03-24 MED ORDER — BLOOD GLUCOSE MONITOR KIT: PACK | 1 refills | Status: DC

## 2021-03-24 MED ORDER — FLUCONAZOLE 100 MG PO TABS
200.0000 mg | ORAL_TABLET | Freq: Once | ORAL | Status: AC
  Administered 2021-03-24: 09:00:00 200 mg via ORAL
  Filled 2021-03-24: qty 2

## 2021-03-24 MED ORDER — CARVEDILOL 6.25 MG PO TABS: 6.2500 mg | ORAL_TABLET | Freq: Two times a day (BID) | ORAL | 0 refills | Status: DC

## 2021-03-24 MED ORDER — METFORMIN HCL 500 MG PO TABS: 500.0000 mg | ORAL_TABLET | Freq: Two times a day (BID) | ORAL | 0 refills | Status: DC

## 2021-03-24 NOTE — Discharge Summary (Signed)
Heidi Costa:[id number] DOB: 09-21-1980 DOA: 03/21/2021  PCP: Minette Brine, FNP  Admit date: 03/21/2021  Discharge date: 03/24/2021  Admitted From: Home   Disposition:  Home   Recommendations for Outpatient Follow-up:   Follow up with PCP in 1-2 weeks  PCP Please obtain BMP/CBC, 2 view CXR in 1week,  (see Discharge instructions)   PCP Please follow up on the following pending results: Monitor CBGs, BMP, blood pressure closely   Home Health: None   Equipment/Devices: None  Consultations: None  Discharge Condition: Stable    CODE STATUS: Full    Diet Recommendation: Heart Healthy Low Carb    Chief Complaint  Patient presents with   Hyperglycemia     Brief history of present illness from the day of admission and additional interim summary    Heidi Costa is a 40 y.o. female with medical history significant of breast cancer status post chemo/radiation/lumpectomy, asthma, anxiety, depression, arthritis, GERD, hyperlipidemia, hypertension, bilateral breast reduction surgery on 03/04/2021.  She was seen at urgent care today for multiple complaints including fatigue, vomiting, dry mouth, blurred vision, and urinary frequency.  Found to be significantly hyperglycemic with blood glucose above 600 and sent to the ED for further evaluation.  In the ER work-up consistent with DKA, new diagnosis of DM type II, severe dehydration and she was admitted for further care.                                                                 Hospital Course   DKA in a patient with new DM2  - she was treated with DKA protocol, DKA has resolved, has been placed on Lantus twice daily along with sliding scale and Premeal NovoLog, CBGs now stable, received diabetic and insulin education, will be discharged on Lantus along  with sliding scale and Glucophage.  Testing supplies provided.  PCP to monitor within 7 to 10 days..   2.  Dehydration with AKI.  Due to #1 above hydrate   3.  Morbid obesity with BMI of 40.  Follow with PCP.   4.  Marijuana abuse.  Counseled to quit.   5.  Anxiety.  As needed benzodiazepine.  Resume home dose baclofen to prevent withdrawal.   6.  Hypokalemia.  Replaced.  PCP to recheck in 1 week.  7. History of asthma.  No acute issues.    Lab Results  Component Value Date   HGBA1C 10.5 (H) 03/23/2021   CBG (last 3)  Recent Labs    03/23/21 1605 03/23/21 2130 03/24/21 0729  GLUCAP 144* 139* 231*    Discharge diagnosis     Principal Problem:   DKA (diabetic ketoacidosis) ([place]) Active Problems:   Intractable nausea and vomiting   AKI (acute kidney injury) ([place])   Breast cancer ([place])  Thrombocytosis   DKA, type 2 (Home)    Discharge instructions    Discharge Instructions     Diet - low sodium heart healthy   Complete by: As directed    Discharge instructions   Complete by: As directed    Follow with Primary MD Minette Brine, FNP in 7 days   Get CBC, CMP, 2 view Chest X ray -  checked next visit within 1 week by Primary MD    Activity: As tolerated with Full fall precautions use walker/cane & assistance as needed  Disposition Home   Diet: Heart Healthy Low Carb  Accuchecks 4 times/day, Once in AM empty stomach and then before each meal. Log in all results and show them to your Prim.MD in 3 days. If any glucose reading is under 80 or above 300 call your Prim MD immidiately. Follow Low glucose instructions for glucose under 80 as instructed.   Special Instructions: If you have smoked or chewed Tobacco  in the last 2 yrs please stop smoking, stop any regular Alcohol  and or any Recreational drug use.  On your next visit with your primary care physician please Get Medicines reviewed and adjusted.  Please request your Prim.MD to go over all Hospital Tests  and Procedure/Radiological results at the follow up, please get all Hospital records sent to your Prim MD by signing hospital release before you go home.  If you experience worsening of your admission symptoms, develop shortness of breath, life threatening emergency, suicidal or homicidal thoughts you must seek medical attention immediately by calling 911 or calling your MD immediately  if symptoms less severe.  You Must read complete instructions/literature along with all the possible adverse reactions/side effects for all the Medicines you take and that have been prescribed to you. Take any new Medicines after you have completely understood and accpet all the possible adverse reactions/side effects.   Increase activity slowly   Complete by: As directed        Discharge Medications   Allergies as of 03/24/2021       Reactions   Aspirin Rash        Medication List     STOP taking these medications    amLODipine 2.5 MG tablet Commonly known as: NORVASC   fluconazole 150 MG tablet Commonly known as: DIFLUCAN   oxyCODONE-acetaminophen 5-325 MG tablet Commonly known as: PERCOCET/ROXICET       TAKE these medications    acetaminophen 500 MG tablet Commonly known as: TYLENOL Take 500-1,000 mg by mouth daily as needed for pain.   albuterol 108 (90 Base) MCG/ACT inhaler Commonly known as: VENTOLIN HFA Inhale 2 puffs into the lungs every 6 (six) hours as needed for wheezing or shortness of breath.   baclofen 10 MG tablet Commonly known as: LIORESAL Take 1 tablet (10 mg total) by mouth 3 (three) times daily.   blood glucose meter kit and supplies Kit Dispense based on patient and insurance preference. Use up to four times daily as directed. (FOR ICD-137). For QAC - HS accuchecks.  Dispense any approved glucometer with 1 month testing supplies.   carvedilol 6.25 MG tablet Commonly known as: COREG Take 1 tablet (6.25 mg total) by mouth 2 (two) times daily with a meal.    insulin aspart 100 UNIT/ML FlexPen Commonly known as: NOVOLOG Before each meal 3 times a day, 140-199 - 2 units, 200-250 - 6 units, 251-299 - 10 units,  300-349 - 12 units,  350 or above 14 units.  Insulin PEN if approved, provide syringes and needles if needed.   Lantus SoloStar 100 UNIT/ML Solostar Pen Generic drug: insulin glargine Inject 40 Units into the skin 2 (two) times daily.   letrozole 2.5 MG tablet Commonly known as: FEMARA Take 1 tablet (2.5 mg total) by mouth daily.   lisinopril 5 MG tablet Commonly known as: ZESTRIL Take 1 tablet (5 mg total) by mouth daily.   metFORMIN 500 MG tablet Commonly known as: Glucophage Take 1 tablet (500 mg total) by mouth 2 (two) times daily with a meal.         Follow-up Information     Minette Brine, FNP. Schedule an appointment as soon as possible for a visit in 1 week(s).   Specialty: General Practice Contact information: [address] [place] [address] [phone]                 Major procedures and Radiology Reports - PLEASE review detailed and final reports thoroughly  -       DG Chest 2 View  Result Date: 03/22/2021 CLINICAL DATA:  History of prior breast reduction several days ago with headaches and fatigue, initial encounter EXAM: CHEST - 2 VIEW COMPARISON:  07/13/19 FINDINGS: The heart size and mediastinal contours are within normal limits. Both lungs are clear. The visualized skeletal structures are unremarkable. Postsurgical changes in the right axilla are noted. Previously seen left chest port has been removed in the interval. Postsurgical changes in the cervical spine are noted. IMPRESSION: No acute abnormality noted. Electronically Signed   By: Inez Catalina M.D.   On: 03/22/2021 03:41     Today   Subjective    Katrianna Milich today has no headache,no chest abdominal pain,no new weakness tingling or numbness, feels much better wants to go home today.     Objective   Blood  pressure 125/74, pulse 71, temperature 99.1 F (37.3 C), temperature source Oral, resp. rate 18, height 5' 5"  (1.651 m), weight 105.4 kg, SpO2 98 %.   Intake/Output Summary (Last 24 hours) at 03/24/2021 1015 Last data filed at 03/24/2021 0945 Gross per 24 hour  Intake 940 ml  Output 250 ml  Net 690 ml    Exam  Awake Alert, No new F.N deficits, Normal affect [place].AT,PERRAL Supple Neck,No JVD, No cervical lymphadenopathy appriciated.  Symmetrical Chest wall movement, Good air movement bilaterally, CTAB RRR,No Gallops,Rubs or new Murmurs, No Parasternal Heave +ve B.Sounds, Abd Soft, Non tender, No organomegaly appriciated, No rebound -guarding or rigidity. No Cyanosis, Clubbing or edema, No new Rash or bruise   Data Review   CBC w Diff:  Lab Results  Component Value Date   WBC 5.3 03/24/2021   HGB 10.7 (L) 03/24/2021   HGB 13.0 02/19/2021   HCT 31.3 (L) 03/24/2021   HCT 38.9 02/19/2021   PLT 319 03/24/2021   PLT 360 02/19/2021   LYMPHOPCT 40 03/24/2021   BANDSPCT 2 08/11/2019   MONOPCT 8 03/24/2021   EOSPCT 1 03/24/2021   BASOPCT 0 03/24/2021    CMP:  Lab Results  Component Value Date   NA 143 03/24/2021   NA 140 02/19/2021   K 2.9 (L) 03/24/2021   CL 104 03/24/2021   CO2 28 03/24/2021   BUN 17 03/24/2021   BUN 10 02/19/2021   CREATININE 0.85 03/24/2021   CREATININE 0.79 09/03/2020   CREATININE 0.79 07/11/2015   PROT 6.0 (L) 03/24/2021   PROT 6.6 02/19/2021   ALBUMIN 2.9 (L) 03/24/2021  ALBUMIN 4.4 02/19/2021   BILITOT 0.6 03/24/2021   BILITOT 0.2 02/19/2021   BILITOT 0.3 09/03/2020   ALKPHOS 64 03/24/2021   AST 43 (H) 03/24/2021   AST 18 09/03/2020   ALT 25 03/24/2021   ALT 14 09/03/2020  .   Total Time in preparing paper work, data evaluation and todays exam - 56 minutes  Lala Lund M.D on 03/24/2021 at 10:15 AM  [facility]

## 2021-03-24 NOTE — Discharge Instructions (Signed)
Follow with Primary MD Minette Brine, FNP in 7 days   Get CBC, CMP, 2 view Chest X ray -  checked next visit within 1 week by Primary MD    Activity: As tolerated with Full fall precautions use walker/cane & assistance as needed  Disposition Home     Diet: Heart Healthy - Low Carb  Accuchecks 4 times/day, Once in AM empty stomach and then before each meal. Log in all results and show them to your Prim.MD in 3 days. If any glucose reading is under 80 or above 300 call your Prim MD immidiately. Follow Low glucose instructions for glucose under 80 as instructed.   Special Instructions: If you have smoked or chewed Tobacco  in the last 2 yrs please stop smoking, stop any regular Alcohol  and or any Recreational drug use.  On your next visit with your primary care physician please Get Medicines reviewed and adjusted.  Please request your Prim.MD to go over all Hospital Tests and Procedure/Radiological results at the follow up, please get all Hospital records sent to your Prim MD by signing hospital release before you go home.  If you experience worsening of your admission symptoms, develop shortness of breath, life threatening emergency, suicidal or homicidal thoughts you must seek medical attention immediately by calling 911 or calling your MD immediately  if symptoms less severe.  You Must read complete instructions/literature along with all the possible adverse reactions/side effects for all the Medicines you take and that have been prescribed to you. Take any new Medicines after you have completely understood and accpet all the possible adverse reactions/side effects.

## 2021-03-24 NOTE — Progress Notes (Signed)
Inpatient Diabetes Program Recommendations  AACE/ADA: New Consensus Statement on Inpatient Glycemic Control (2015)  Target Ranges:  Prepandial:   less than 140 mg/dL      Peak postprandial:   less than 180 mg/dL (1-2 hours)      Critically ill patients:  140 - 180 mg/dL   Lab Results  Component Value Date   GLUCAP 231 (H) 03/24/2021   HGBA1C 10.5 (H) 03/23/2021    Review of Glycemic Control  Latest Reference Range & Units 03/23/21 07:54 03/23/21 11:51 03/23/21 16:05 03/23/21 21:30 03/24/21 07:29  Glucose-Capillary 70 - 99 mg/dL 303 (H) 372 (H) 144 (H) 139 (H) 231 (H)  (H): Data is abnormally high  Diabetes history: New DM2  Current orders for Inpatient glycemic control: Semglee 35 units BID, Novolog 0-20 TID and 0-5 QHS, Novolog 4 units TID with meals   Spoke with patient at bedside.  Educated patient on insulin pen use at home. Reviewed contents of insulin flexpen starter kit. Reviewed all steps of insulin pen including attachment of needle, 2-unit air shot, dialing up dose, giving injection, removing needle, disposal of sharps, storage of unused insulin, disposal of insulin etc. Patient able to provide successful return demonstration. Also reviewed troubleshooting with insulin pen. MD to give patient Rxs for insulin pens and insulin pen needles.  Asked MD if I could place a [employer].  Educated patient on how to apply and use.  Provided her with a reader as her app on her smart phone is not working.  Provided her with an extra to last a month.    She will need close follow up her PCP to review blood glucose and make adjustments as needed.  Discussed Hypoglycemia, signs, symptoms and treatments.  Reviewed CHO's and The Plate Method, impact of exercise of blood glucose and eliminating sugary beverages.  Educated her of basal and rapid insulins.    For DC: Glucometer order # [id number] Novolog FlexPen order # [id number] Lantus Solostar pen order # [id number] Pen needles Order #  [id number] Benny Lennert sensor order # [id number]  Thank you, Reche Dixon, RN, BSN Diabetes Coordinator Inpatient Diabetes Program [phone] (team pager from 8a-5p)

## 2021-03-24 NOTE — TOC Transition Note (Signed)
Transition of Care [facility]) - CM/SW Discharge Note   Patient Details  Name: Heidi Costa MRN: [id number] Date of Birth: 1980-06-15  Transition of [facility]) CM/SW Contact:  Cyndi Bender, RN Phone Number: 03/24/2021, 11:36 AM   Clinical Narrative:    Patient stable for discharge. She has transportation home.  No other needs from [facility]   Final next level of care: Home/Self Care Barriers to Discharge: Barriers Resolved   Patient Goals and CMS Choice Patient states their goals for this hospitalization and ongoing recovery are:: return home      Discharge Placement          Home             Discharge Plan and Services                       Home self care              Social Determinants of Health (SDOH) Interventions     Readmission Risk Interventions No flowsheet data found.

## 2021-03-24 NOTE — Progress Notes (Signed)
Discharge paperwork reviewed with pt. Pt verbalized understanding. Pt's husband will transport pt home. Pt stated husband will be here around 1300 to pick up her.

## 2021-03-26 ENCOUNTER — Telehealth: Payer: Self-pay

## 2021-03-26 ENCOUNTER — Encounter: Payer: Self-pay | Admitting: Hematology and Oncology

## 2021-03-26 ENCOUNTER — Other Ambulatory Visit (HOSPITAL_COMMUNITY): Payer: Self-pay

## 2021-03-26 NOTE — Telephone Encounter (Signed)
Transition Care Management Unsuccessful Follow-up Telephone Call  Date of discharge and from where:  03/24/2021 [facility]   Attempts:  1st Attempt  Reason for unsuccessful TCM follow-up call:  Left voice message

## 2021-03-27 ENCOUNTER — Other Ambulatory Visit: Payer: Self-pay

## 2021-03-27 ENCOUNTER — Ambulatory Visit (INDEPENDENT_AMBULATORY_CARE_PROVIDER_SITE_OTHER): Payer: BC Managed Care – PPO | Admitting: Nurse Practitioner

## 2021-03-27 VITALS — BP 132/70 | Temp 98.3°F | Ht 65.0 in | Wt 231.0 lb

## 2021-03-27 DIAGNOSIS — E1110 Type 2 diabetes mellitus with ketoacidosis without coma: Secondary | ICD-10-CM

## 2021-03-27 DIAGNOSIS — I10 Essential (primary) hypertension: Secondary | ICD-10-CM

## 2021-03-27 DIAGNOSIS — E0810 Diabetes mellitus due to underlying condition with ketoacidosis without coma: Secondary | ICD-10-CM

## 2021-03-27 DIAGNOSIS — E7849 Other hyperlipidemia: Secondary | ICD-10-CM

## 2021-03-27 DIAGNOSIS — F320 Major depressive disorder, single episode, mild: Secondary | ICD-10-CM

## 2021-03-27 DIAGNOSIS — Z13228 Encounter for screening for other metabolic disorders: Secondary | ICD-10-CM

## 2021-03-27 DIAGNOSIS — Z794 Long term (current) use of insulin: Secondary | ICD-10-CM | POA: Diagnosis not present

## 2021-03-27 LAB — LIPID PANEL
Chol/HDL Ratio: 5.9 ratio — ABNORMAL HIGH (ref 0.0–4.4)
Cholesterol, Total: 208 mg/dL — ABNORMAL HIGH (ref 100–199)
HDL: 35 mg/dL — ABNORMAL LOW (ref >39)
LDL Chol Calc (NIH): 142 mg/dL — ABNORMAL HIGH (ref 0–99)
Triglycerides: 172 mg/dL — ABNORMAL HIGH (ref 0–149)
VLDL Cholesterol Cal: 31 mg/dL (ref 5–40)

## 2021-03-27 LAB — CMP14+EGFR
ALT: 52 IU/L — ABNORMAL HIGH (ref 0–32)
AST: 72 IU/L — ABNORMAL HIGH (ref 0–40)
Albumin/Globulin Ratio: 1.5 (ref 1.2–2.2)
Albumin: 3.8 g/dL (ref 3.8–4.8)
Alkaline Phosphatase: 86 IU/L (ref 44–121)
BUN/Creatinine Ratio: 11 (ref 9–23)
BUN: 9 mg/dL (ref 6–24)
Bilirubin Total: 0.4 mg/dL (ref 0.0–1.2)
CO2: 26 mmol/L (ref 20–29)
Calcium: 9.1 mg/dL (ref 8.7–10.2)
Chloride: 100 mmol/L (ref 96–106)
Creatinine, Ser: 0.82 mg/dL (ref 0.57–1.00)
Globulin, Total: 2.5 g/dL (ref 1.5–4.5)
Glucose: 328 mg/dL — ABNORMAL HIGH (ref 70–99)
Potassium: 4.3 mmol/L (ref 3.5–5.2)
Sodium: 137 mmol/L (ref 134–144)
Total Protein: 6.3 g/dL (ref 6.0–8.5)
eGFR: 92 mL/min/1.73

## 2021-03-27 LAB — CBC
Hematocrit: 32.9 % — ABNORMAL LOW (ref 34.0–46.6)
Hemoglobin: 11.1 g/dL (ref 11.1–15.9)
MCH: 31.8 pg (ref 26.6–33.0)
MCHC: 33.7 g/dL (ref 31.5–35.7)
MCV: 94 fL (ref 79–97)
Platelets: 358 10*3/uL (ref 150–450)
RBC: 3.49 x10E6/uL — ABNORMAL LOW (ref 3.77–5.28)
RDW: 12.4 % (ref 11.7–15.4)
WBC: 5.6 10*3/uL (ref 3.4–10.8)

## 2021-03-27 LAB — POCT UA - MICROALBUMIN
Albumin/Creatinine Ratio, Urine, POC: <30
Creatinine, POC: 300 mg/dL
Microalbumin Ur, POC: 30 mg/L

## 2021-03-27 MED ORDER — ATORVASTATIN CALCIUM 10 MG PO TABS: ORAL_TABLET | ORAL | 2 refills | Status: DC

## 2021-03-27 MED ORDER — METFORMIN HCL ER 500 MG PO TB24: ORAL_TABLET | ORAL | 1 refills | Status: DC

## 2021-03-27 MED ORDER — BUPROPION HCL ER (XL) 150 MG PO TB24: 150.0000 mg | ORAL_TABLET | ORAL | 2 refills | Status: DC

## 2021-03-27 NOTE — Progress Notes (Signed)
I,Tianna Badgett,acting as a Education administrator for [name], NP.,have documented all relevant documentation on the behalf of [name], NP,as directed by  Bary Castilla, NP while in the presence of Bary Castilla, NP.  This visit occurred during the SARS-CoV-2 public health emergency.  Safety protocols were in place, including screening questions prior to the visit, additional usage of staff PPE, and extensive cleaning of exam room while observing appropriate contact time as indicated for disinfecting solutions.  Subjective:     Patient ID: Heidi Costa , female    DOB: [birth year] , 40 y.o.   MRN: [id number]   Chief Complaint  Patient presents with   Hospitalization Follow-up    HPI  Patient is here for a ED follow up. Stated that she was dizzy when she walked in. Checked blood sugar in office and it was 375. She had a surgery on 12/6 and after that she started getting sometimes of increased thirst, kept urinating a lot. She went on the 12/23 to the urgent care when she found out that her Blood sugar was in the 600s.  She sees her oncologist on Jan. 13 again.  She still smokes mariajuana. But is trying to quit   Diabetes Hypoglycemia symptoms include dizziness.    Past Medical History:  Diagnosis Date   Abnormality of gait 08/02/2012   resolved per patient on 07/12/19   Anemia    iron taking in the past, not currently   Anxiety    Arthritis    spine- cerv.  & arm    Asthma    Bulging lumbar disc    Cancer ([place])    Right Breast-starts chemo on 07/14/19   Chronic arthralgias of knees and hips 08/02/2012   Depression    Pt. took self off Celexa one month ago, states she did n't like how it made her feel .   Family history of cancer    GERD (gastroesophageal reflux disease)    diet controlled   Heart murmur    pt. been told that this is a fact, no echo or cardiac surveillance in the past    HLD (hyperlipidemia)    no meds, diet controlled   Hypertension     MRSA (methicillin resistant Staphylococcus aureus) 03/02/2005   Obesity    Spinal stenosis      Family History  Problem Relation Age of Onset   Osteoarthritis Mother    Hypertension Mother    Diabetes Mother    Hypertension Father    Gout Father    Stroke Father 80       embolic   Diabetes Half-Brother    Hypertension Half-Brother    Heart failure Half-Brother 58   Kidney disease Half-Brother    Diabetes Paternal Grandfather    Cancer Paternal Grandfather 80       breast cancer     Current Outpatient Medications:    acetaminophen (TYLENOL) 500 MG tablet, Take 500-1,000 mg by mouth daily as needed for pain., Disp: , Rfl:    albuterol (VENTOLIN HFA) 108 (90 Base) MCG/ACT inhaler, Inhale 2 puffs into the lungs every 6 (six) hours as needed for wheezing or shortness of breath., Disp: 18 g, Rfl: 2   baclofen (LIORESAL) 10 MG tablet, Take 1 tablet (10 mg total) by mouth 3 (three) times daily., Disp: 90 each, Rfl: 3   blood glucose meter kit and supplies KIT, Dispense based on patient and insurance preference. Use up to four times daily as directed. (FOR ICD-137). For QAC -  HS accuchecks.  Dispense any approved glucometer with 1 month testing supplies., Disp: 1 each, Rfl: 1   buPROPion (WELLBUTRIN XL) 150 MG 24 hr tablet, Take 1 tablet (150 mg total) by mouth every morning., Disp: 30 tablet, Rfl: 2   carvedilol (COREG) 6.25 MG tablet, Take 1 tablet (6.25 mg total) by mouth 2 (two) times daily with a meal., Disp: 60 tablet, Rfl: 0   insulin aspart (NOVOLOG) 100 UNIT/ML FlexPen, Before each meal 3 times a day, 140-199 - 2 units, 200-250 - 6 units, 251-299 - 10 units,  300-349 - 12 units,  350 or above 14 units.  Insulin PEN if approved, provide syringes and needles if needed., Disp: 15 mL, Rfl: 0   insulin glargine (LANTUS SOLOSTAR) 100 UNIT/ML Solostar Pen, Inject 40 Units into the skin 2 (two) times daily., Disp: 15 mL, Rfl: 0   letrozole (FEMARA) 2.5 MG tablet, Take 1 tablet (2.5 mg total)  by mouth daily., Disp: 90 tablet, Rfl: 3   lisinopril (ZESTRIL) 5 MG tablet, Take 1 tablet (5 mg total) by mouth daily., Disp: 30 tablet, Rfl: 0   metFORMIN (GLUCOPHAGE XR) 500 MG 24 hr tablet, Take 2 tablet by mouth twice daily with a meal., Disp: 180 tablet, Rfl: 1   Allergies  Allergen Reactions   Aspirin Rash     Review of Systems  Constitutional: Negative.   Respiratory: Negative.    Cardiovascular: Negative.   Gastrointestinal: Negative.   Neurological:  Positive for dizziness.    Today's Vitals   03/27/21 0939  BP: 132/70  Temp: 98.3 F (36.8 C)  TempSrc: Oral  Weight: 231 lb (104.8 kg)  Height: $Remove'5\' 5"'ZipiRHB$  (1.651 m)   Body mass index is 38.44 kg/m.   Objective:  Physical Exam      Assessment And Plan:     1. Diabetic ketoacidosis without coma associated with type 2 diabetes mellitus ([place]) - Ambulatory referral to Endocrinology - Referral to Nutrition and Diabetes Services - CMP14+EGFR - CBC no Diff - DG Chest 2 View; Future - metFORMIN (GLUCOPHAGE XR) 500 MG 24 hr tablet; Take 2 tablet by mouth twice daily with a meal.  Dispense: 180 tablet; Refill: 1 - POCT UA - Microalbumin  2. Mild major depression ([facility]) - buPROPion (WELLBUTRIN XL) 150 MG 24 hr tablet; Take 1 tablet (150 mg total) by mouth every morning.  Dispense: 30 tablet; Refill: 2  3. Diabetes mellitus due to underlying condition with ketoacidosis without coma, with long-term current use of insulin ([place]) - Ambulatory referral to Endocrinology - Referral to Nutrition and Diabetes Services - metFORMIN (GLUCOPHAGE XR) 500 MG 24 hr tablet; Take 2 tablet by mouth twice daily with a meal.  Dispense: 180 tablet; Refill: 1 - POCT UA - Microalbumin  4. Encounter for screening for metabolic disorder - Lipid panel - Hemoglobin A1c     Patient was given opportunity to ask questions. Patient verbalized understanding of the plan and was able to repeat key elements of the plan. All questions were answered to their  satisfaction.  Bary Castilla, NP   I, Bary Castilla, NP, have reviewed all documentation for this visit. The documentation on 03/27/21 for the exam, diagnosis, procedures, and orders are all accurate and complete.   IF YOU HAVE BEEN REFERRED TO A SPECIALIST, IT MAY TAKE 1-2 WEEKS TO SCHEDULE/PROCESS THE REFERRAL. IF YOU HAVE NOT HEARD FROM US/SPECIALIST IN TWO WEEKS, PLEASE GIVE [place] A CALL AT [phone].   THE PATIENT IS ENCOURAGED TO  PRACTICE SOCIAL DISTANCING DUE TO THE COVID-19 PANDEMIC.

## 2021-03-27 NOTE — Patient Instructions (Signed)

## 2021-03-29 ENCOUNTER — Encounter: Payer: Self-pay | Admitting: Nurse Practitioner

## 2021-04-02 ENCOUNTER — Other Ambulatory Visit: Payer: Self-pay | Admitting: Nurse Practitioner

## 2021-04-02 MED ORDER — JANUMET 50-500 MG PO TABS: 1.0000 | ORAL_TABLET | Freq: Two times a day (BID) | ORAL | 1 refills | Status: DC

## 2021-04-03 ENCOUNTER — Encounter: Payer: BC Managed Care – PPO | Admitting: Physician Assistant

## 2021-04-11 ENCOUNTER — Inpatient Hospital Stay: Payer: BC Managed Care – PPO | Attending: Hematology and Oncology

## 2021-04-11 ENCOUNTER — Other Ambulatory Visit: Payer: Self-pay

## 2021-04-11 VITALS — BP 112/63 | HR 68 | Temp 97.9°F | Resp 18

## 2021-04-11 DIAGNOSIS — C50411 Malignant neoplasm of upper-outer quadrant of right female breast: Secondary | ICD-10-CM | POA: Diagnosis present

## 2021-04-11 DIAGNOSIS — Z95828 Presence of other vascular implants and grafts: Secondary | ICD-10-CM

## 2021-04-11 DIAGNOSIS — Z5111 Encounter for antineoplastic chemotherapy: Secondary | ICD-10-CM | POA: Insufficient documentation

## 2021-04-11 DIAGNOSIS — Z170 Estrogen receptor positive status [ER+]: Secondary | ICD-10-CM | POA: Diagnosis not present

## 2021-04-11 MED ORDER — GOSERELIN ACETATE 3.6 MG ~~LOC~~ IMPL
3.6000 mg | DRUG_IMPLANT | Freq: Once | SUBCUTANEOUS | Status: AC
  Administered 2021-04-11: 3.6 mg via SUBCUTANEOUS
  Filled 2021-04-11: qty 3.6

## 2021-04-16 NOTE — Telephone Encounter (Signed)
Error

## 2021-04-17 ENCOUNTER — Other Ambulatory Visit: Payer: Self-pay

## 2021-04-17 ENCOUNTER — Encounter: Payer: Self-pay | Admitting: Nurse Practitioner

## 2021-04-17 ENCOUNTER — Ambulatory Visit: Payer: BC Managed Care – PPO | Admitting: Nurse Practitioner

## 2021-04-17 VITALS — BP 108/64 | HR 83 | Temp 98.1°F | Ht 65.0 in | Wt 234.4 lb

## 2021-04-17 DIAGNOSIS — E1369 Other specified diabetes mellitus with other specified complication: Secondary | ICD-10-CM

## 2021-04-17 DIAGNOSIS — E0810 Diabetes mellitus due to underlying condition with ketoacidosis without coma: Secondary | ICD-10-CM

## 2021-04-17 DIAGNOSIS — Z6839 Body mass index (BMI) 39.0-39.9, adult: Secondary | ICD-10-CM

## 2021-04-17 DIAGNOSIS — Z170 Estrogen receptor positive status [ER+]: Secondary | ICD-10-CM

## 2021-04-17 DIAGNOSIS — Z794 Long term (current) use of insulin: Secondary | ICD-10-CM

## 2021-04-17 DIAGNOSIS — E1110 Type 2 diabetes mellitus with ketoacidosis without coma: Secondary | ICD-10-CM

## 2021-04-17 DIAGNOSIS — E782 Mixed hyperlipidemia: Secondary | ICD-10-CM

## 2021-04-17 DIAGNOSIS — F320 Major depressive disorder, single episode, mild: Secondary | ICD-10-CM

## 2021-04-17 DIAGNOSIS — Z2821 Immunization not carried out because of patient refusal: Secondary | ICD-10-CM

## 2021-04-17 DIAGNOSIS — C50411 Malignant neoplasm of upper-outer quadrant of right female breast: Secondary | ICD-10-CM

## 2021-04-17 MED ORDER — ATORVASTATIN CALCIUM 10 MG PO TABS: ORAL_TABLET | ORAL | 2 refills | Status: DC

## 2021-04-17 NOTE — Progress Notes (Signed)
I,Heidi Costa,acting as a Education administrator for [name], NP.,have documented all relevant documentation on the behalf of [name], NP,as directed by  Bary Castilla, NP while in the presence of Bary Castilla, NP.  This visit occurred during the SARS-CoV-2 public health emergency.  Safety protocols were in place, including screening questions prior to the visit, additional usage of staff PPE, and extensive cleaning of exam room while observing appropriate contact time as indicated for disinfecting solutions.  Subjective:     Patient ID: Heidi Costa , female    DOB: Aug 04, 1980 , 41 y.o.   MRN: [id number]   Chief Complaint  Patient presents with   Diabetes    HPI  The patient is here today for a diabetes f/u. She is doing well. She has been checking her BS at home. It has been running 100s-180s. She does have a appt with nutritionist as well as sleep study. She has been taking all of her med prescribed to her. She has also been referred to a endocrinologist. She is working on her diet and exercise.   Diabetes Hypoglycemia symptoms include dizziness. Pertinent negatives for hypoglycemia include no headaches. Pertinent negatives for diabetes include no chest pain, no fatigue, no polydipsia, no polyphagia, no polyuria and no weakness.    Past Medical History:  Diagnosis Date   Abnormality of gait 08/02/2012   resolved per patient on 07/12/19   Anemia    iron taking in the past, not currently   Anxiety    Arthritis    spine- cerv.  & arm    Asthma    Bulging lumbar disc    Cancer ([place])    Right Breast-starts chemo on 07/14/19   Chronic arthralgias of knees and hips 08/02/2012   Depression    Pt. took self off Celexa one month ago, states she did n't like how it made her feel .   Family history of cancer    GERD (gastroesophageal reflux disease)    diet controlled   Heart murmur    pt. been told that this is a fact, no echo or cardiac surveillance in the past     HLD (hyperlipidemia)    no meds, diet controlled   Hypertension    MRSA (methicillin resistant Staphylococcus aureus) 03/02/2005   Obesity    Spinal stenosis      Family History  Problem Relation Age of Onset   Osteoarthritis Mother    Hypertension Mother    Diabetes Mother    Hypertension Father    Gout Father    Stroke Father 52       embolic   Diabetes Half-Brother    Hypertension Half-Brother    Heart failure Half-Brother 81   Kidney disease Half-Brother    Diabetes Paternal Grandfather    Cancer Paternal Grandfather 86       breast cancer     Current Outpatient Medications:    acetaminophen (TYLENOL) 500 MG tablet, Take 500-1,000 mg by mouth daily as needed for pain., Disp: , Rfl:    albuterol (VENTOLIN HFA) 108 (90 Base) MCG/ACT inhaler, Inhale 2 puffs into the lungs every 6 (six) hours as needed for wheezing or shortness of breath., Disp: 18 g, Rfl: 2   baclofen (LIORESAL) 10 MG tablet, Take 1 tablet (10 mg total) by mouth 3 (three) times daily., Disp: 90 each, Rfl: 3   blood glucose meter kit and supplies KIT, Dispense based on patient and insurance preference. Use up to four times daily as directed. (  FOR ICD-137). For QAC - HS accuchecks.  Dispense any approved glucometer with 1 month testing supplies., Disp: 1 each, Rfl: 1   buPROPion (WELLBUTRIN XL) 150 MG 24 hr tablet, Take 1 tablet (150 mg total) by mouth every morning., Disp: 30 tablet, Rfl: 2   carvedilol (COREG) 6.25 MG tablet, Take 1 tablet (6.25 mg total) by mouth 2 (two) times daily with a meal., Disp: 60 tablet, Rfl: 0   insulin aspart (NOVOLOG) 100 UNIT/ML FlexPen, Before each meal 3 times a day, 140-199 - 2 units, 200-250 - 6 units, 251-299 - 10 units,  300-349 - 12 units,  350 or above 14 units.  Insulin PEN if approved, provide syringes and needles if needed., Disp: 15 mL, Rfl: 0   insulin glargine (LANTUS SOLOSTAR) 100 UNIT/ML Solostar Pen, Inject 40 Units into the skin 2 (two) times daily., Disp: 15 mL,  Rfl: 0   letrozole (FEMARA) 2.5 MG tablet, Take 1 tablet (2.5 mg total) by mouth daily., Disp: 90 tablet, Rfl: 3   lisinopril (ZESTRIL) 5 MG tablet, Take 1 tablet (5 mg total) by mouth daily., Disp: 30 tablet, Rfl: 0   sitaGLIPtin-metformin (JANUMET) 50-500 MG tablet, Take 1 tablet by mouth 2 (two) times daily with a meal., Disp: 90 tablet, Rfl: 1   atorvastatin (LIPITOR) 10 MG tablet, Take 1 tablet by mouth daily, Disp: 30 tablet, Rfl: 2   Allergies  Allergen Reactions   Aspirin Rash     Review of Systems  Constitutional:  Negative for fatigue.  Cardiovascular:  Negative for chest pain and palpitations.  Gastrointestinal:  Negative for constipation and diarrhea.  Endocrine: Negative for polydipsia, polyphagia and polyuria.  Neurological:  Positive for dizziness. Negative for weakness and headaches.    Today's Vitals   04/17/21 1207  BP: 108/64  Pulse: 83  Temp: 98.1 F (36.7 C)  Weight: 234 lb 6.4 oz (106.3 kg)  Height: 5' 5"  (1.651 m)   Body mass index is 39.01 kg/m.  Wt Readings from Last 3 Encounters:  04/17/21 234 lb 6.4 oz (106.3 kg)  03/27/21 231 lb (104.8 kg)  03/23/21 232 lb 5.8 oz (105.4 kg)    BP Readings from Last 3 Encounters:  04/17/21 108/64  04/11/21 112/63  03/27/21 132/70    Objective:  Physical Exam Constitutional:      Appearance: Normal appearance. She is obese.  HENT:     Head: Normocephalic and atraumatic.  Cardiovascular:     Rate and Rhythm: Normal rate and regular rhythm.     Pulses: Normal pulses.     Heart sounds: Normal heart sounds. No murmur heard. Pulmonary:     Effort: Pulmonary effort is normal. No respiratory distress.     Breath sounds: Normal breath sounds. No wheezing.  Skin:    General: Skin is warm and dry.  Neurological:     Mental Status: She is alert.        Assessment And Plan:     1. 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  -Checking BS at home running between 100-180s.  -Advised on eating healthy avoiding sugar and sweets.  -Increase physical activity  -Referral to endocrinology already placed  -Has appt with meal planning  - Hemoglobin A1c - CMP14+EGFR  2. Mild major depression ([facility]) -Continue wellbutrin XL 150 mg   3. Mixed hyperlipidemia -Will check lipid panel at next visit  -Advised patient to  decrease her intake of fatty foods, fried and fast foods  -Incorporate physical exercise for atleast 30-45 min.  - atorvastatin (LIPITOR) 10 MG tablet; Take 1 tablet by mouth daily  Dispense: 30 tablet; Refill: 2  4. COVID-19 vaccination declined -Benefits given on the covid vaccine.   5. Class 2 severe obesity due to excess calories with serious comorbidity and body mass index (BMI) of 39.0 to 39.9 in adult [facility]) Advised patient on a healthy diet including avoiding fast food and red meats. Increase the intake of lean meats including grilled chicken and [place].  Drink a lot of water. Decrease intake of fatty foods. Exercise for 30-45 min. 4-5 a week to decrease the risk of cardiac event.   The patient was encouraged to call or send a message through [place] for any questions or concerns.   Follow up: if symptoms persist or do not get better.   Side effects and appropriate use of all the medication(s) were discussed with the patient today. Patient advised to use the medication(s) as directed by their healthcare provider. The patient was encouraged to read, review, and understand all associated package inserts and contact our office with any questions or concerns. The patient accepts the risks of the treatment plan and had an opportunity to ask questions.   Patient was given opportunity to ask questions. Patient verbalized understanding of the plan and was able to repeat key elements of the plan. All questions were answered to their satisfaction.  Raman Harshil Cavallaro, DNP   I, Raman Izzy [place]  have reviewed all documentation for this visit. The documentation on 04/17/21 for the exam, diagnosis, procedures, and orders are all accurate and complete.    IF YOU HAVE BEEN REFERRED TO A SPECIALIST, IT MAY TAKE 1-2 WEEKS TO SCHEDULE/PROCESS THE REFERRAL. IF YOU HAVE NOT HEARD FROM US/SPECIALIST IN TWO WEEKS, PLEASE GIVE [place] A CALL AT [phone].   THE PATIENT IS ENCOURAGED TO PRACTICE SOCIAL DISTANCING DUE TO THE COVID-19 PANDEMIC.

## 2021-04-17 NOTE — Patient Instructions (Signed)

## 2021-04-18 LAB — CMP14+EGFR
ALT: 13 IU/L (ref 0–32)
AST: 17 IU/L (ref 0–40)
Albumin/Globulin Ratio: 2.1 (ref 1.2–2.2)
Albumin: 4.4 g/dL (ref 3.8–4.8)
Alkaline Phosphatase: 68 IU/L (ref 44–121)
BUN/Creatinine Ratio: 11 (ref 9–23)
BUN: 8 mg/dL (ref 6–24)
Bilirubin Total: <0.2 mg/dL (ref 0.0–1.2)
CO2: 22 mmol/L (ref 20–29)
Calcium: 9.7 mg/dL (ref 8.7–10.2)
Chloride: 105 mmol/L (ref 96–106)
Creatinine, Ser: 0.74 mg/dL (ref 0.57–1.00)
Globulin, Total: 2.1 g/dL (ref 1.5–4.5)
Glucose: 138 mg/dL — ABNORMAL HIGH (ref 70–99)
Potassium: 4.7 mmol/L (ref 3.5–5.2)
Sodium: 144 mmol/L (ref 134–144)
Total Protein: 6.5 g/dL (ref 6.0–8.5)
eGFR: 104 mL/min/{1.73_m2} (ref >59)

## 2021-04-18 LAB — HEMOGLOBIN A1C
Est. average glucose Bld gHb Est-mCnc: 209 mg/dL
Hgb A1c MFr Bld: 8.9 % — ABNORMAL HIGH (ref 4.8–5.6)

## 2021-04-21 ENCOUNTER — Other Ambulatory Visit: Payer: Self-pay

## 2021-04-21 MED ORDER — FREESTYLE LIBRE 2 SENSOR MISC: 1 refills | Status: DC

## 2021-05-07 ENCOUNTER — Encounter (HOSPITAL_COMMUNITY): Payer: Self-pay

## 2021-05-12 ENCOUNTER — Inpatient Hospital Stay: Payer: BC Managed Care – PPO | Attending: Hematology and Oncology

## 2021-05-12 ENCOUNTER — Institutional Professional Consult (permissible substitution): Payer: BC Managed Care – PPO | Admitting: Neurology

## 2021-05-12 ENCOUNTER — Other Ambulatory Visit: Payer: Self-pay

## 2021-05-12 VITALS — BP 109/73 | HR 80 | Temp 99.2°F | Resp 20

## 2021-05-12 DIAGNOSIS — Z5111 Encounter for antineoplastic chemotherapy: Secondary | ICD-10-CM | POA: Insufficient documentation

## 2021-05-12 DIAGNOSIS — Z170 Estrogen receptor positive status [ER+]: Secondary | ICD-10-CM | POA: Insufficient documentation

## 2021-05-12 DIAGNOSIS — C50411 Malignant neoplasm of upper-outer quadrant of right female breast: Secondary | ICD-10-CM | POA: Diagnosis present

## 2021-05-12 DIAGNOSIS — Z95828 Presence of other vascular implants and grafts: Secondary | ICD-10-CM

## 2021-05-12 MED ORDER — GOSERELIN ACETATE 3.6 MG ~~LOC~~ IMPL
3.6000 mg | DRUG_IMPLANT | Freq: Once | SUBCUTANEOUS | Status: AC
  Administered 2021-05-12: 3.6 mg via SUBCUTANEOUS
  Filled 2021-05-12: qty 3.6

## 2021-05-20 ENCOUNTER — Other Ambulatory Visit: Payer: Self-pay

## 2021-05-20 ENCOUNTER — Encounter: Payer: Self-pay | Admitting: Dietician

## 2021-05-20 ENCOUNTER — Encounter: Payer: BC Managed Care – PPO | Attending: Nurse Practitioner | Admitting: Dietician

## 2021-05-20 DIAGNOSIS — E1110 Type 2 diabetes mellitus with ketoacidosis without coma: Secondary | ICD-10-CM | POA: Insufficient documentation

## 2021-05-20 DIAGNOSIS — Z794 Long term (current) use of insulin: Secondary | ICD-10-CM | POA: Diagnosis not present

## 2021-05-20 DIAGNOSIS — Z713 Dietary counseling and surveillance: Secondary | ICD-10-CM | POA: Diagnosis not present

## 2021-05-20 NOTE — Patient Instructions (Addendum)
If your pre-meal blood sugar is below 140, do not take your Novolog with your meal!  Work towards eating three meals a day, about 5-6 hours apart!  Begin to recognize carbohydrates, proteins, and non-starchy vegetables in your food choices!  Begin to build your meals using the proportions of the Balanced Plate. First, select your carb choice(s) for the meal. Make this 25% of your meal. Next, select your source of protein to pair with your carb choice(s). Make this another 25% of your meal. Finally, complete your meal with a variety of non-starchy vegetables. Make this the remaining 50% of your meal.  Look into buying some divided portion plates to start building balanced meals!  Keep up with the physical activity, make sure to eat a little something before you do!

## 2021-05-20 NOTE — Progress Notes (Signed)
Diabetes Self-Management Education  Visit Type: First/Initial  Appt. Start Time: 1600 Appt. End Time: 1700  05/20/2021  [name], identified by name and date of birth, is a 41 y.o. female with a diagnosis of Diabetes: Type 2.   ASSESSMENT Pt had breast surgery in early December, started having polyuria, polydipsia, and blurry vision and suspected it was due to the surgery. Pt went to Urgent Care and was found to be severely hyperglycemic.  Pt reports going to ER with DKA in December, CBG was over 600.  Pt is on a sliding scale with Novolog, but is not currently taking it due to hypoglycemia after eating. Pt states their CBG is usually around 120 before eating, and their slidin scale starts at a preprandial BG of 140. Pt is taking Lantus each morning, 20 units. Pt is using Freestyle Libre2 CGM, states their TIR (70-180) is about 99%. Pt reports improved glycemic control since starting Janumet as well. Pt reports using an exercise bike and does aerobics occasionally. 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 Height _0  (1.651 m), weight 237 lb (107.5 kg). Body mass index is 39.44 kg/m.   Diabetes Self-Management Education - 05/20/21 1630       Visit Information   Visit Type First/Initial      Initial Visit   Diabetes Type Type 2    Are you currently following a meal plan? No    Are you taking your medications as prescribed? No    Date Diagnosed January, 2023      Health Coping   How would you rate your overall health? Good      Psychosocial Assessment   Patient Belief/Attitude about Diabetes Motivated to manage diabetes    Self-care barriers None    Self-management support Family    Other persons present Patient    Patient Concerns Glycemic Control;Nutrition/Meal planning;Healthy Lifestyle    Special Needs None    Preferred Learning Style No preference indicated    Learning Readiness Ready    How often do you need  to have someone help you when you read instructions, pamphlets, or other written materials from your doctor or pharmacy? 1 - Never    What is the last grade level you completed in school? Associates Degree      Pre-Education Assessment   Patient understands the diabetes disease and treatment process. Needs Instruction    Patient understands incorporating nutritional management into lifestyle. Needs Instruction    Patient undertands incorporating physical activity into lifestyle. Needs Instruction    Patient understands using medications safely. Needs Instruction    Patient understands monitoring blood glucose, interpreting and using results Needs Instruction    Patient understands prevention, detection, and treatment of acute complications. Needs Instruction    Patient understands prevention, detection, and treatment of chronic complications. Needs Instruction    Patient understands how to develop strategies to address psychosocial issues. Needs Instruction    Patient understands how to develop strategies to promote health/change behavior. Needs Instruction      Complications   Last HgB A1C per patient/outside source 8.9 %   04/17/2021   How often do you check your blood sugar? > 4 times/day   Freestyle Libre2   Fasting Blood glucose range (mg/dL) 70-129    Postprandial Blood glucose range (mg/dL) 130-179    Number of hypoglycemic episodes per month 1    Can you tell when your blood sugar is low? Yes   Heidi Costa  alerted them   What do you do if your blood sugar is low? Ate a cough drop    Have you had a dilated eye exam in the past 12 months? Yes    Have you had a dental exam in the past 12 months? No    Are you checking your feet? No      Dietary Intake   Breakfast Bacon egg and cheese biscuit, diet pepsi    Lunch PB Crackers, 8 vanilla wafers, cranberry cocktail juice    Dinner [employer], cabbage, mac and cheese, chocolate milk    Snack (evening) 2 nutty bars    Beverage(s) Diet  pepsi, cranberry, chocolate      Exercise   Exercise Type ADL's;Light (walking / raking leaves)    How many days per week to you exercise? 3    How many minutes per day do you exercise? 30    Total minutes per week of exercise 90      Patient Education   Previous Diabetes Education No    Disease state  Definition of diabetes, type 1 and 2, and the diagnosis of diabetes;Explored patient's options for treatment of their diabetes;Factors that contribute to the development of diabetes    Nutrition management  Carbohydrate counting;Role of diet in the treatment of diabetes and the relationship between the three main macronutrients and blood glucose level;Food label reading, portion sizes and measuring food.;Reviewed blood glucose goals for pre and post meals and how to evaluate the patients' food intake on their blood glucose level.;Meal timing in regards to the patients' current diabetes medication.    Physical activity and exercise  Identified with patient nutritional and/or medication changes necessary with exercise.    Medications Taught/reviewed insulin injection, site rotation, insulin storage and needle disposal.;Reviewed patients medication for diabetes, action, purpose, timing of dose and side effects.    Monitoring Identified appropriate SMBG and/or A1C goals.;Interpreting lab values - A1C, lipid, urine microalbumina.    Acute complications Taught treatment of hypoglycemia - the 15 rule.    Chronic complications Relationship between chronic complications and blood glucose control    Psychosocial adjustment Role of stress on diabetes    Personal strategies to promote health Helped patient develop diabetes management plan for (enter comment)   Establishing a care team     Individualized Goals (developed by patient)   Nutrition Follow meal plan discussed;Adjust meds/carbs with exercise as discussed    Physical Activity Exercise 1-2 times per week    Medications take my medication as  prescribed;Other (comment)   Discuss insulin regiment with your PCP   Monitoring  test my blood glucose as discussed;test blood glucose pre and post meals as discussed    Reducing Risk examine blood glucose patterns;treat hypoglycemia with 15 grams of carbs if blood glucose less than 65m/dL      Post-Education Assessment   Patient understands the diabetes disease and treatment process. Needs Review    Patient understands incorporating nutritional management into lifestyle. Needs Review    Patient undertands incorporating physical activity into lifestyle. Needs Review    Patient understands using medications safely. Needs Review    Patient understands monitoring blood glucose, interpreting and using results Needs Review    Patient understands prevention, detection, and treatment of acute complications. Needs Review    Patient understands prevention, detection, and treatment of chronic complications. Needs Review    Patient understands how to develop strategies to address psychosocial issues. Needs Review    Patient understands how to  develop strategies to promote health/change behavior. Needs Review      Outcomes   Expected Outcomes Demonstrated interest in learning. Expect positive outcomes    Future DMSE 4-6 wks    Program Status Not Completed             Individualized Plan for Diabetes Self-Management Training:   Learning Objective:  Patient will have a greater understanding of diabetes self-management. Patient education plan is to attend individual and/or group sessions per assessed needs and concerns.   Plan:   Patient Instructions  If your pre-meal blood sugar is below 140, do not take your Novolog with your meal!  Work towards eating three meals a day, about 5-6 hours apart!  Begin to recognize carbohydrates, proteins, and non-starchy vegetables in your food choices!  Begin to build your meals using the proportions of the Balanced Plate. First, select your carb  choice(s) for the meal. Make this 25% of your meal. Next, select your source of protein to pair with your carb choice(s). Make this another 25% of your meal. Finally, complete your meal with a variety of non-starchy vegetables. Make this the remaining 50% of your meal.  Look into buying some divided portion plates to start building balanced meals!  Keep up with the physical activity, make sure to eat a little something before you do!   Expected Outcomes:  Demonstrated interest in learning. Expect positive outcomes  Education material provided: ADA - How to Thrive: A Guide for Your Journey with Diabetes and My Plate  If problems or questions, patient to contact team via:  Phone and Email  Future DSME appointment: 4-6 wks

## 2021-05-22 ENCOUNTER — Other Ambulatory Visit: Payer: Self-pay

## 2021-05-22 MED ORDER — CARVEDILOL 6.25 MG PO TABS: 6.2500 mg | ORAL_TABLET | Freq: Two times a day (BID) | ORAL | 0 refills | Status: DC

## 2021-06-09 ENCOUNTER — Inpatient Hospital Stay: Payer: BC Managed Care – PPO | Attending: Hematology and Oncology

## 2021-06-09 ENCOUNTER — Other Ambulatory Visit: Payer: Self-pay

## 2021-06-09 VITALS — BP 145/89 | HR 90 | Temp 98.8°F | Resp 20

## 2021-06-09 DIAGNOSIS — C50411 Malignant neoplasm of upper-outer quadrant of right female breast: Secondary | ICD-10-CM | POA: Diagnosis present

## 2021-06-09 DIAGNOSIS — Z95828 Presence of other vascular implants and grafts: Secondary | ICD-10-CM

## 2021-06-09 DIAGNOSIS — Z5111 Encounter for antineoplastic chemotherapy: Secondary | ICD-10-CM | POA: Diagnosis not present

## 2021-06-09 MED ORDER — GOSERELIN ACETATE 3.6 MG ~~LOC~~ IMPL
3.6000 mg | DRUG_IMPLANT | Freq: Once | SUBCUTANEOUS | Status: AC
  Administered 2021-06-09: 3.6 mg via SUBCUTANEOUS
  Filled 2021-06-09: qty 3.6

## 2021-06-11 ENCOUNTER — Other Ambulatory Visit: Payer: Self-pay | Admitting: Internal Medicine

## 2021-07-03 ENCOUNTER — Ambulatory Visit: Payer: BC Managed Care – PPO | Admitting: Internal Medicine

## 2021-07-03 ENCOUNTER — Encounter: Payer: Self-pay | Admitting: Internal Medicine

## 2021-07-03 VITALS — BP 124/82 | HR 98 | Temp 97.7°F | Ht 65.0 in | Wt 244.6 lb

## 2021-07-03 DIAGNOSIS — E1169 Type 2 diabetes mellitus with other specified complication: Secondary | ICD-10-CM | POA: Diagnosis not present

## 2021-07-03 DIAGNOSIS — I10 Essential (primary) hypertension: Secondary | ICD-10-CM

## 2021-07-03 DIAGNOSIS — Z6841 Body Mass Index (BMI) 40.0 and over, adult: Secondary | ICD-10-CM

## 2021-07-03 DIAGNOSIS — E785 Hyperlipidemia, unspecified: Secondary | ICD-10-CM | POA: Diagnosis not present

## 2021-07-03 DIAGNOSIS — Z79899 Other long term (current) drug therapy: Secondary | ICD-10-CM

## 2021-07-03 DIAGNOSIS — C50919 Malignant neoplasm of unspecified site of unspecified female breast: Secondary | ICD-10-CM

## 2021-07-03 DIAGNOSIS — Z170 Estrogen receptor positive status [ER+]: Secondary | ICD-10-CM

## 2021-07-03 DIAGNOSIS — C50911 Malignant neoplasm of unspecified site of right female breast: Secondary | ICD-10-CM

## 2021-07-03 NOTE — Patient Instructions (Signed)

## 2021-07-03 NOTE — Progress Notes (Signed)
?[name],acting as a Education administrator for [name], MD.,have documented all relevant documentation on the behalf of [name], MD,as directed by  [name], MD while in the presence of [name], MD.  ?This visit occurred during the SARS-CoV-2 public health emergency.  Safety protocols were in place, including screening questions prior to the visit, additional usage of staff PPE, and extensive cleaning of exam room while observing appropriate contact time as indicated for disinfecting solutions. ? ?Subjective:  ?  ? Patient ID: MI BALLA , female    DOB: 1981-02-17 , 40 y.o.   MRN: [id number] ? ? ?Chief Complaint  ?Patient presents with  ? Diabetes  ? ? ?HPI ? ?The patient is here today for a diabetes f/u. She is doing well. Patient reports she doesn't think she needs her insulin anymore. She has been working with a nutritionist. She states her sugars have improved since she has made some serious dietary changes.  ? ?Diabetes ?She presents for her follow-up diabetic visit. She has type 2 diabetes mellitus. Pertinent negatives for hypoglycemia include no dizziness or headaches. Pertinent negatives for diabetes include no chest pain, no fatigue, no polydipsia, no polyphagia, no polyuria and no weakness. There are no hypoglycemic complications.   ? ?Past Medical History:  ?Diagnosis Date  ? Abnormality of gait 08/02/2012  ? resolved per patient on 07/12/19  ? Anemia   ? iron taking in the past, not currently  ? Anxiety   ? Arthritis   ? spine- cerv.  & arm   ? Asthma   ? Bulging lumbar disc   ? Cancer [facility])   ? Right Breast-starts chemo on 07/14/19  ? Chronic arthralgias of knees and hips 08/02/2012  ? Depression   ? Pt. took self off Celexa one month ago, states she did n't like how it made her feel .  ? Family history of cancer   ? GERD (gastroesophageal reflux disease)   ? diet controlled  ? Heart murmur   ? pt. been told that this is a fact, no echo or cardiac surveillance in the  past   ? HLD (hyperlipidemia)   ? no meds, diet controlled  ? Hypertension   ? MRSA (methicillin resistant Staphylococcus aureus) 03/02/2005  ? Obesity   ? Spinal stenosis   ?  ? ?Family History  ?Problem Relation Age of Onset  ? Osteoarthritis Mother   ? Hypertension Mother   ? Diabetes Mother   ? Hypertension Father   ? Gout Father   ? Stroke Father 31  ?     embolic  ? Diabetes Half-Brother   ? Hypertension Half-Brother   ? Heart failure Half-Brother 45  ? Kidney disease Half-Brother   ? Diabetes Paternal Grandfather   ? Cancer Paternal Grandfather 72  ?     breast cancer  ? ? ? ?Current Outpatient Medications:  ?  acetaminophen (TYLENOL) 500 MG tablet, Take 500-1,000 mg by mouth daily as needed for pain., Disp: , Rfl:  ?  albuterol (VENTOLIN HFA) 108 (90 Base) MCG/ACT inhaler, Inhale 2 puffs into the lungs every 6 (six) hours as needed for wheezing or shortness of breath., Disp: 18 g, Rfl: 2 ?  baclofen (LIORESAL) 10 MG tablet, Take 1 tablet (10 mg total) by mouth 3 (three) times daily., Disp: 90 each, Rfl: 3 ?  blood glucose meter kit and supplies KIT, Dispense based on patient and insurance preference. Use up to four times daily as directed. (  FOR ICD-137). For QAC - HS accuchecks.  Dispense any approved glucometer with 1 month testing supplies., Disp: 1 each, Rfl: 1 ?  buPROPion (WELLBUTRIN XL) 150 MG 24 hr tablet, Take 1 tablet (150 mg total) by mouth every morning., Disp: 30 tablet, Rfl: 2 ?  carvedilol (COREG) 6.25 MG tablet, Take 1 tablet (6.25 mg total) by mouth 2 (two) times daily with a meal., Disp: 180 tablet, Rfl: 0 ?  Continuous Blood Gluc Sensor (FREESTYLE LIBRE 2 SENSOR) MISC, USE AS DIRECTED TO  CHECK  BLOOD  SUGARS  5  TIMES  PER  DAY  AS  NEEDED, Disp: 2 each, Rfl: 0 ?  insulin glargine (LANTUS SOLOSTAR) 100 UNIT/ML Solostar Pen, Inject 40 Units into the skin 2 (two) times daily., Disp: 15 mL, Rfl: 0 ?  letrozole (FEMARA) 2.5 MG tablet, Take 1 tablet (2.5 mg total) by mouth daily., Disp: 90  tablet, Rfl: 3 ?  sitaGLIPtin-metformin (JANUMET) 50-500 MG tablet, Take 1 tablet by mouth 2 (two) times daily with a meal., Disp: 90 tablet, Rfl: 1 ?  atorvastatin (LIPITOR) 10 MG tablet, Take 1 tablet by mouth daily, Disp: 30 tablet, Rfl: 2 ?  dapagliflozin propanediol (FARXIGA) 10 MG TABS tablet, Take 1 tablet (10 mg total) by mouth daily., Disp: 30 tablet, Rfl: 1 ?  insulin aspart (NOVOLOG) 100 UNIT/ML FlexPen, Before each meal 3 times a day, 140-199 - 2 units, 200-250 - 6 units, 251-299 - 10 units,  300-349 - 12 units,  350 or above 14 units.  Insulin PEN if approved, provide syringes and needles if needed. (Patient not taking: Reported on 07/03/2021), Disp: 15 mL, Rfl: 0 ?  lisinopril (ZESTRIL) 5 MG tablet, Take 1 tablet (5 mg total) by mouth daily. (Patient not taking: Reported on 07/03/2021), Disp: 30 tablet, Rfl: 0  ? ?Allergies  ?Allergen Reactions  ? Aspirin Rash  ?  ? ?Review of Systems  ?Constitutional: Negative.  Negative for fatigue.  ?Respiratory: Negative.    ?Cardiovascular: Negative.  Negative for chest pain.  ?Gastrointestinal: Negative.   ?Endocrine: Negative for polydipsia, polyphagia and polyuria.  ?Neurological:  Negative for dizziness, weakness and headaches.  ?Psychiatric/Behavioral: Negative.     ? ?Today's Vitals  ? 07/03/21 1507  ?BP: 124/82  ?Pulse: 98  ?Temp: 97.7 ?F (36.5 ?C)  ?Weight: 244 lb 9.6 oz (110.9 kg)  ?Height: 5' 5"  (1.651 m)  ?PainSc: 0-No pain  ? ?Body mass index is 40.7 kg/m?.  ?Wt Readings from Last 3 Encounters:  ?07/03/21 244 lb 9.6 oz (110.9 kg)  ?05/20/21 237 lb (107.5 kg)  ?04/17/21 234 lb 6.4 oz (106.3 kg)  ?  ?BP Readings from Last 3 Encounters:  ?07/10/21 133/83  ?07/03/21 124/82  ?06/09/21 (!) 145/89  ? ? ? ?Objective:  ?Physical Exam ?Vitals and nursing note reviewed.  ?Constitutional:   ?   Appearance: Normal appearance. She is obese.  ?HENT:  ?   Head: Normocephalic and atraumatic.  ?   Nose:  ?   Comments: Masked  ?   Mouth/Throat:  ?   Comments: Masked  ?Eyes:   ?   Extraocular Movements: Extraocular movements intact.  ?Cardiovascular:  ?   Rate and Rhythm: Normal rate and regular rhythm.  ?   Heart sounds: Normal heart sounds.  ?Pulmonary:  ?   Effort: Pulmonary effort is normal.  ?   Breath sounds: Normal breath sounds.  ?Musculoskeletal:  ?   Cervical back: Normal range of motion.  ?Skin: ?   General:  Skin is warm.  ?Neurological:  ?   General: No focal deficit present.  ?   Mental Status: She is alert.  ?Psychiatric:     ?   Mood and Affect: Mood normal.     ?   Behavior: Behavior normal.  ?   ?Assessment And Plan:  ?   ?1. Dyslipidemia associated with type 2 diabetes mellitus ([place]) ?Comments: Chronic, she was congratulated on her lifestyle changes. I will check a1c and adjust meds as needed. For now, she is advised to decrease Lantus to 20 units nightly. Will consider adding [place]. She will f/u in 56month for re-evaluation.  ?- Lipid panel ?- CMP14+EGFR ?- Hemoglobin A1c ? ?2. Essential hypertension, benign ?Comments: Chronic, will check renal function. Pt advised if normal, she will need to resume lisinopril. She does not wish to conceive at this time.  ? ?3. Malignant neoplasm of right breast in female, estrogen receptor positive, unspecified site of breast ([place] ?Comments: Initially diagnosed in March 2021, she palpated a painful lump in her right breast. She has completed chemo,  surgery, XRT. Now on antiestrogen therapy, started 1/22. ? ?4. Class 3 severe obesity due to excess calories with serious comorbidity and body mass index (BMI) of 40.0 to 44.9 in adult ([facility] ?Comments: BMI 40. she has gained 7 lbs since Feb 2023. She is encouraged to aim for at least 150 minutes of exercise per week.  ? ?5. Drug therapy ?  ? ? ?Patient was given opportunity to ask questions. Patient verbalized understanding of the plan and was able to repeat key elements of the plan. All questions were answered to their satisfaction.  ? ?I, RMaximino Greenland MD, have reviewed all  documentation for this visit. The documentation on 07/03/21 for the exam, diagnosis, procedures, and orders are all accurate and complete.  ? ?IF YOU HAVE BEEN REFERRED TO A SPECIALIST, IT MAY TAKE 1-2 WEEKS TO S

## 2021-07-04 LAB — LIPID PANEL
Chol/HDL Ratio: 6 ratio — ABNORMAL HIGH (ref 0.0–4.4)
Cholesterol, Total: 229 mg/dL — ABNORMAL HIGH (ref 100–199)
HDL: 38 mg/dL — ABNORMAL LOW (ref >39)
LDL Chol Calc (NIH): 135 mg/dL — ABNORMAL HIGH (ref 0–99)
Triglycerides: 313 mg/dL — ABNORMAL HIGH (ref 0–149)
VLDL Cholesterol Cal: 56 mg/dL — ABNORMAL HIGH (ref 5–40)

## 2021-07-04 LAB — CMP14+EGFR
ALT: 13 IU/L (ref 0–32)
AST: 16 IU/L (ref 0–40)
Albumin/Globulin Ratio: 2 (ref 1.2–2.2)
Albumin: 4.4 g/dL (ref 3.8–4.8)
Alkaline Phosphatase: 87 IU/L (ref 44–121)
BUN/Creatinine Ratio: 12 (ref 9–23)
BUN: 9 mg/dL (ref 6–24)
Bilirubin Total: 0.2 mg/dL (ref 0.0–1.2)
CO2: 24 mmol/L (ref 20–29)
Calcium: 9.7 mg/dL (ref 8.7–10.2)
Chloride: 104 mmol/L (ref 96–106)
Creatinine, Ser: 0.76 mg/dL (ref 0.57–1.00)
Globulin, Total: 2.2 g/dL (ref 1.5–4.5)
Glucose: 158 mg/dL — ABNORMAL HIGH (ref 70–99)
Potassium: 4.4 mmol/L (ref 3.5–5.2)
Sodium: 145 mmol/L — ABNORMAL HIGH (ref 134–144)
Total Protein: 6.6 g/dL (ref 6.0–8.5)
eGFR: 101 mL/min/{1.73_m2} (ref >59)

## 2021-07-04 LAB — HEMOGLOBIN A1C
Est. average glucose Bld gHb Est-mCnc: 163 mg/dL
Hgb A1c MFr Bld: 7.3 % — ABNORMAL HIGH (ref 4.8–5.6)

## 2021-07-10 ENCOUNTER — Inpatient Hospital Stay: Payer: BC Managed Care – PPO | Attending: Hematology and Oncology

## 2021-07-10 ENCOUNTER — Other Ambulatory Visit: Payer: Self-pay

## 2021-07-10 VITALS — BP 133/83 | HR 100 | Temp 98.8°F | Resp 18

## 2021-07-10 DIAGNOSIS — Z5111 Encounter for antineoplastic chemotherapy: Secondary | ICD-10-CM | POA: Insufficient documentation

## 2021-07-10 DIAGNOSIS — C50411 Malignant neoplasm of upper-outer quadrant of right female breast: Secondary | ICD-10-CM | POA: Insufficient documentation

## 2021-07-10 DIAGNOSIS — Z170 Estrogen receptor positive status [ER+]: Secondary | ICD-10-CM | POA: Diagnosis not present

## 2021-07-10 DIAGNOSIS — Z95828 Presence of other vascular implants and grafts: Secondary | ICD-10-CM

## 2021-07-10 MED ORDER — GOSERELIN ACETATE 3.6 MG ~~LOC~~ IMPL
3.6000 mg | DRUG_IMPLANT | Freq: Once | SUBCUTANEOUS | Status: AC
  Administered 2021-07-10: 3.6 mg via SUBCUTANEOUS
  Filled 2021-07-10: qty 3.6

## 2021-07-11 ENCOUNTER — Other Ambulatory Visit: Payer: Self-pay

## 2021-07-11 DIAGNOSIS — E782 Mixed hyperlipidemia: Secondary | ICD-10-CM

## 2021-07-11 MED ORDER — DAPAGLIFLOZIN PROPANEDIOL 10 MG PO TABS
10.0000 mg | ORAL_TABLET | Freq: Every day | ORAL | 1 refills | Status: DC
Start: 2021-07-11 — End: 2021-09-22

## 2021-07-11 MED ORDER — ATORVASTATIN CALCIUM 10 MG PO TABS: ORAL_TABLET | ORAL | 2 refills | Status: DC

## 2021-07-17 ENCOUNTER — Ambulatory Visit: Payer: BC Managed Care – PPO | Admitting: Dietician

## 2021-07-21 ENCOUNTER — Other Ambulatory Visit: Payer: Self-pay | Admitting: Nurse Practitioner

## 2021-07-23 ENCOUNTER — Other Ambulatory Visit: Payer: Self-pay

## 2021-07-23 DIAGNOSIS — F320 Major depressive disorder, single episode, mild: Secondary | ICD-10-CM

## 2021-07-23 MED ORDER — BUPROPION HCL ER (XL) 150 MG PO TB24: 150.0000 mg | ORAL_TABLET | ORAL | 3 refills | Status: DC

## 2021-08-11 ENCOUNTER — Inpatient Hospital Stay: Payer: BC Managed Care – PPO | Attending: Hematology and Oncology

## 2021-08-11 ENCOUNTER — Other Ambulatory Visit: Payer: Self-pay

## 2021-08-11 VITALS — BP 156/80 | HR 77 | Temp 98.5°F | Resp 20

## 2021-08-11 DIAGNOSIS — Z170 Estrogen receptor positive status [ER+]: Secondary | ICD-10-CM | POA: Diagnosis not present

## 2021-08-11 DIAGNOSIS — Z5111 Encounter for antineoplastic chemotherapy: Secondary | ICD-10-CM | POA: Insufficient documentation

## 2021-08-11 DIAGNOSIS — C50411 Malignant neoplasm of upper-outer quadrant of right female breast: Secondary | ICD-10-CM | POA: Insufficient documentation

## 2021-08-11 DIAGNOSIS — Z95828 Presence of other vascular implants and grafts: Secondary | ICD-10-CM

## 2021-08-11 MED ORDER — GOSERELIN ACETATE 3.6 MG ~~LOC~~ IMPL
3.6000 mg | DRUG_IMPLANT | Freq: Once | SUBCUTANEOUS | Status: AC
  Administered 2021-08-11: 3.6 mg via SUBCUTANEOUS
  Filled 2021-08-11: qty 3.6

## 2021-08-19 ENCOUNTER — Ambulatory Visit (INDEPENDENT_AMBULATORY_CARE_PROVIDER_SITE_OTHER): Payer: BC Managed Care – PPO | Admitting: Internal Medicine

## 2021-08-19 ENCOUNTER — Encounter: Payer: Self-pay | Admitting: Internal Medicine

## 2021-08-19 VITALS — BP 124/80 | HR 88 | Temp 98.4°F | Ht 65.0 in | Wt 247.2 lb

## 2021-08-19 DIAGNOSIS — E1169 Type 2 diabetes mellitus with other specified complication: Secondary | ICD-10-CM

## 2021-08-19 DIAGNOSIS — Z6841 Body Mass Index (BMI) 40.0 and over, adult: Secondary | ICD-10-CM

## 2021-08-19 DIAGNOSIS — E785 Hyperlipidemia, unspecified: Secondary | ICD-10-CM | POA: Diagnosis not present

## 2021-08-19 NOTE — Progress Notes (Signed)
[name],acting as a Education administrator for [name], MD.,have documented all relevant documentation on the behalf of [name], MD,as directed by  [name], MD while in the presence of [name], MD.  This visit occurred during the SARS-CoV-2 public health emergency.  Safety protocols were in place, including screening questions prior to the visit, additional usage of staff PPE, and extensive cleaning of exam room while observing appropriate contact time as indicated for disinfecting solutions.  Subjective:     Patient ID: Heidi Costa , female    DOB: 1980-05-24 , 41 y.o.   MRN: [id number]   Chief Complaint  Patient presents with   Diabetes    HPI  The patient is here today for a diabetes f/u. Patient reports compliance with her meds. She was started on Farxiga at her last visit. She has not had any issues with the medication. Patient does not have any questions or concerns at this time.  Diabetes She presents for her follow-up diabetic visit. She has type 2 diabetes mellitus. Pertinent negatives for hypoglycemia include no dizziness or headaches. Pertinent negatives for diabetes include no chest pain, no fatigue, no polydipsia, no polyphagia, no polyuria and no weakness. There are no hypoglycemic complications.    Past Medical History:  Diagnosis Date   Abnormality of gait 08/02/2012   resolved per patient on 07/12/19   Anemia    iron taking in the past, not currently   Anxiety    Arthritis    spine- cerv.  & arm    Asthma    Bulging lumbar disc    Cancer ([place])    Right Breast-starts chemo on 07/14/19   Chronic arthralgias of knees and hips 08/02/2012   Depression    Pt. took self off Celexa one month ago, states she did n't like how it made her feel .   Family history of cancer    GERD (gastroesophageal reflux disease)    diet controlled   Heart murmur    pt. been told that this is a fact, no echo or cardiac surveillance in the past    HLD  (hyperlipidemia)    no meds, diet controlled   Hypertension    MRSA (methicillin resistant Staphylococcus aureus) 03/02/2005   Obesity    Spinal stenosis      Family History  Problem Relation Age of Onset   Osteoarthritis Mother    Hypertension Mother    Diabetes Mother    Hypertension Father    Gout Father    Stroke Father 65       embolic   Diabetes Half-Brother    Hypertension Half-Brother    Heart failure Half-Brother 59   Kidney disease Half-Brother    Diabetes Paternal Grandfather    Cancer Paternal Grandfather 71       breast cancer     Current Outpatient Medications:    acetaminophen (TYLENOL) 500 MG tablet, Take 500-1,000 mg by mouth daily as needed for pain., Disp: , Rfl:    albuterol (VENTOLIN HFA) 108 (90 Base) MCG/ACT inhaler, Inhale 2 puffs into the lungs every 6 (six) hours as needed for wheezing or shortness of breath., Disp: 18 g, Rfl: 2   atorvastatin (LIPITOR) 10 MG tablet, Take 1 tablet by mouth daily, Disp: 30 tablet, Rfl: 2   baclofen (LIORESAL) 10 MG tablet, Take 1 tablet (10 mg total) by mouth 3 (three) times daily., Disp: 90 each, Rfl: 3   blood glucose meter kit and  supplies KIT, Dispense based on patient and insurance preference. Use up to four times daily as directed. (FOR ICD-137). For QAC - HS accuchecks.  Dispense any approved glucometer with 1 month testing supplies., Disp: 1 each, Rfl: 1   buPROPion (WELLBUTRIN XL) 150 MG 24 hr tablet, Take 1 tablet (150 mg total) by mouth every morning., Disp: 30 tablet, Rfl: 3   carvedilol (COREG) 6.25 MG tablet, TAKE 1 TABLET BY MOUTH TWICE DAILY WITH A MEAL, Disp: 180 tablet, Rfl: 0   Continuous Blood Gluc Sensor (FREESTYLE LIBRE 2 SENSOR) MISC, USE AS DIRECTED TO  CHECK  BLOOD  SUGARS  5  TIMES  PER  DAY  AS  NEEDED, Disp: 2 each, Rfl: 0   dapagliflozin propanediol (FARXIGA) 10 MG TABS tablet, Take 1 tablet (10 mg total) by mouth daily., Disp: 30 tablet, Rfl: 1   letrozole (FEMARA) 2.5 MG tablet, Take 1  tablet (2.5 mg total) by mouth daily., Disp: 90 tablet, Rfl: 3   sitaGLIPtin-metformin (JANUMET) 50-500 MG tablet, Take 1 tablet by mouth 2 (two) times daily with a meal., Disp: 90 tablet, Rfl: 1   insulin aspart (NOVOLOG) 100 UNIT/ML FlexPen, Before each meal 3 times a day, 140-199 - 2 units, 200-250 - 6 units, 251-299 - 10 units,  300-349 - 12 units,  350 or above 14 units.  Insulin PEN if approved, provide syringes and needles if needed. (Patient not taking: Reported on 08/19/2021), Disp: 15 mL, Rfl: 0   Allergies  Allergen Reactions   Aspirin Rash     Review of Systems  Constitutional: Negative.  Negative for fatigue.  Respiratory: Negative.    Cardiovascular: Negative.  Negative for chest pain.  Gastrointestinal: Negative.   Endocrine: Negative for polydipsia, polyphagia and polyuria.  Genitourinary:  Positive for frequency.  Neurological: Negative.  Negative for dizziness, weakness and headaches.  Psychiatric/Behavioral: Negative.      Today's Vitals   08/19/21 1613  BP: 124/80  Pulse: 88  Temp: 98.4 F (36.9 C)  Weight: 247 lb 3.2 oz (112.1 kg)  Height: 5' 5"  (1.651 m)  PainSc: 0-No pain   Body mass index is 41.14 kg/m.  Wt Readings from Last 3 Encounters:  08/19/21 247 lb 3.2 oz (112.1 kg)  07/03/21 244 lb 9.6 oz (110.9 kg)  05/20/21 237 lb (107.5 kg)     Objective:  Physical Exam Vitals and nursing note reviewed.  Constitutional:      Appearance: Normal appearance.  HENT:     Head: Normocephalic and atraumatic.  Cardiovascular:     Rate and Rhythm: Normal rate and regular rhythm.     Heart sounds: Normal heart sounds.  Pulmonary:     Effort: Pulmonary effort is normal.     Breath sounds: Normal breath sounds.  Skin:    General: Skin is warm.  Neurological:     General: No focal deficit present.     Mental Status: She is alert.  Psychiatric:        Mood and Affect: Mood normal.        Behavior: Behavior normal.        Assessment And Plan:     1.  Dyslipidemia associated with type 2 diabetes mellitus ([place]) Comments: She will c/w [place]. I will check renal function today.  - Lipid panel - BMP8+EGFR - ALT  2. Class 3 severe obesity due to excess calories with serious comorbidity and body mass index (BMI) of 40.0 to 44.9 in adult [facility]) Comments: BMI 41. She  has gained 10 lbs since Feb 2023. She is encouraged to incorporate more exercise into her daily routine, aiming for at least 150 minutes/wk.    Patient was given opportunity to ask questions. Patient verbalized understanding of the plan and was able to repeat key elements of the plan. All questions were answered to their satisfaction.   I, [name], MD, have reviewed all documentation for this visit. The documentation on 08/19/21 for the exam, diagnosis, procedures, and orders are all accurate and complete.   IF YOU HAVE BEEN REFERRED TO A SPECIALIST, IT MAY TAKE 1-2 WEEKS TO SCHEDULE/PROCESS THE REFERRAL. IF YOU HAVE NOT HEARD FROM US/SPECIALIST IN TWO WEEKS, PLEASE GIVE [place] A CALL AT [phone].   THE PATIENT IS ENCOURAGED TO PRACTICE SOCIAL DISTANCING DUE TO THE COVID-19 PANDEMIC.

## 2021-08-19 NOTE — Patient Instructions (Signed)

## 2021-08-20 LAB — BMP8+EGFR
BUN/Creatinine Ratio: 9 (ref 9–23)
BUN: 9 mg/dL (ref 6–24)
CO2: 25 mmol/L (ref 20–29)
Calcium: 9.9 mg/dL (ref 8.7–10.2)
Chloride: 102 mmol/L (ref 96–106)
Creatinine, Ser: 0.98 mg/dL (ref 0.57–1.00)
Glucose: 106 mg/dL — ABNORMAL HIGH (ref 70–99)
Potassium: 4.5 mmol/L (ref 3.5–5.2)
Sodium: 142 mmol/L (ref 134–144)
eGFR: 74 mL/min/{1.73_m2} (ref >59)

## 2021-08-20 LAB — LIPID PANEL
Chol/HDL Ratio: 4.4 ratio (ref 0.0–4.4)
Cholesterol, Total: 163 mg/dL (ref 100–199)
HDL: 37 mg/dL — ABNORMAL LOW (ref >39)
LDL Chol Calc (NIH): 91 mg/dL (ref 0–99)
Triglycerides: 205 mg/dL — ABNORMAL HIGH (ref 0–149)
VLDL Cholesterol Cal: 35 mg/dL (ref 5–40)

## 2021-08-20 LAB — ALT: ALT: 13 IU/L (ref 0–32)

## 2021-09-11 ENCOUNTER — Other Ambulatory Visit: Payer: Self-pay

## 2021-09-11 ENCOUNTER — Inpatient Hospital Stay: Payer: BC Managed Care – PPO | Attending: Hematology and Oncology

## 2021-09-11 VITALS — BP 125/86 | HR 80 | Temp 99.0°F | Resp 20

## 2021-09-11 DIAGNOSIS — Z95828 Presence of other vascular implants and grafts: Secondary | ICD-10-CM

## 2021-09-11 DIAGNOSIS — Z170 Estrogen receptor positive status [ER+]: Secondary | ICD-10-CM | POA: Diagnosis not present

## 2021-09-11 DIAGNOSIS — Z79811 Long term (current) use of aromatase inhibitors: Secondary | ICD-10-CM | POA: Insufficient documentation

## 2021-09-11 DIAGNOSIS — C50411 Malignant neoplasm of upper-outer quadrant of right female breast: Secondary | ICD-10-CM | POA: Diagnosis present

## 2021-09-11 DIAGNOSIS — Z5111 Encounter for antineoplastic chemotherapy: Secondary | ICD-10-CM | POA: Diagnosis present

## 2021-09-11 MED ORDER — GOSERELIN ACETATE 3.6 MG ~~LOC~~ IMPL
3.6000 mg | DRUG_IMPLANT | Freq: Once | SUBCUTANEOUS | Status: AC
  Administered 2021-09-11: 3.6 mg via SUBCUTANEOUS
  Filled 2021-09-11: qty 3.6

## 2021-09-17 ENCOUNTER — Other Ambulatory Visit: Payer: BC Managed Care – PPO

## 2021-09-22 ENCOUNTER — Other Ambulatory Visit: Payer: Self-pay | Admitting: Internal Medicine

## 2021-09-23 ENCOUNTER — Ambulatory Visit (INDEPENDENT_AMBULATORY_CARE_PROVIDER_SITE_OTHER): Payer: BC Managed Care – PPO | Admitting: Internal Medicine

## 2021-09-23 ENCOUNTER — Encounter: Payer: Self-pay | Admitting: Internal Medicine

## 2021-09-23 VITALS — BP 132/70 | HR 78 | Ht 65.0 in | Wt 243.0 lb

## 2021-09-23 DIAGNOSIS — E785 Hyperlipidemia, unspecified: Secondary | ICD-10-CM | POA: Diagnosis not present

## 2021-09-23 DIAGNOSIS — E119 Type 2 diabetes mellitus without complications: Secondary | ICD-10-CM | POA: Diagnosis not present

## 2021-09-23 DIAGNOSIS — E1169 Type 2 diabetes mellitus with other specified complication: Secondary | ICD-10-CM | POA: Insufficient documentation

## 2021-09-23 LAB — POCT GLYCOSYLATED HEMOGLOBIN (HGB A1C): Hemoglobin A1C: 6.1 % — AB (ref 4.0–5.6)

## 2021-09-23 MED ORDER — DAPAGLIFLOZIN PROPANEDIOL 10 MG PO TABS: 10.0000 mg | ORAL_TABLET | Freq: Every day | ORAL | 3 refills | Status: DC

## 2021-09-23 MED ORDER — OZEMPIC (0.25 OR 0.5 MG/DOSE) 2 MG/3ML ~~LOC~~ SOPN: 0.5000 mg | PEN_INJECTOR | SUBCUTANEOUS | 3 refills | Status: DC

## 2021-09-23 NOTE — Progress Notes (Signed)
Name: Heidi Costa  MRN/ DOB: [id number], [birth year]   Age/ Sex: 41 y.o., female    PCP: Heidi Peng, MD   Reason for Endocrinology Evaluation: Type 2 Diabetes Mellitus     Date of Initial Endocrinology Visit: 09/23/2021     PATIENT IDENTIFIER: Heidi Costa is a 41 y.o. female with a past medical history of Hx of breast ca ( S/P lumpectomy 2021) and chemo. The patient presented for initial endocrinology clinic visit on 09/23/2021 for consultative assistance with her diabetes management.    HPI: [name] was    Diagnosed with DM 02/2021 when she presented to the ED with symptomatic hyperglycemia.  She was noted to be in DKA, with an elevated anion gap at 25 and elevated  beta hydroxybutyrate> mmol/L. She was initially discharged on insulin and Metformin .  Prior Medications tried/Intolerance: Metformin, Janumet Currently checking blood sugars 2 x / day Hypoglycemia episodes : yes               Symptoms: yes                 Frequency: rare/  Hemoglobin A1c has ranged from 6.1% in 2023, peaking at 10.5% in 2022.   In terms of diet, the patient eats 3 meals a day, snacks 1-2 times a day . Drinks cranberry juice and sweet tea    HOME DIABETES REGIMEN: Farxiga 10 mg Ozempic 0.5 mg weekly   Statin: Yes ACE-I/ARB: no Prior Diabetic Education: yes 2023   METER DOWNLOAD SUMMARY: Date range evaluated: 5/28-6/27/2023 Fingerstick Blood Glucose Tests = 23 Average Number Tests/Day = 0.7 Overall Mean FS Glucose = 144 Standard Deviation = 34  BG Ranges: Low = 63 High = 234   Hypoglycemic Events/30 Days: BG < 50 = 0 Episodes of symptomatic severe hypoglycemia = 0   DIABETIC COMPLICATIONS: Microvascular complications:   Denies: CKD Last eye exam: Completed 03/2021  Macrovascular complications:   Denies: CAD, PVD, CVA   PAST HISTORY: Past Medical History:  Past Medical History:  Diagnosis Date   Abnormality of gait 08/02/2012   resolved per patient on  07/12/19   Anemia    iron taking in the past, not currently   Anxiety    Arthritis    spine- cerv.  & arm    Asthma    Bulging lumbar disc    Cancer ([facility])    Right Breast-starts chemo on 07/14/19   Chronic arthralgias of knees and hips 08/02/2012   Depression    Pt. took self off Celexa one month ago, states she did n't like how it made her feel .   Family history of cancer    GERD (gastroesophageal reflux disease)    diet controlled   Heart murmur    pt. been told that this is a fact, no echo or cardiac surveillance in the past    HLD (hyperlipidemia)    no meds, diet controlled   Hypertension    MRSA (methicillin resistant Staphylococcus aureus) 03/02/2005   Obesity    Spinal stenosis    Past Surgical History:  Past Surgical History:  Procedure Laterality Date   ANTERIOR CERVICAL DECOMP/DISCECTOMY FUSION N/A 10/27/2012   Procedure: ANTERIOR CERVICAL DECOMPRESSION/DISCECTOMY FUSION 1 LEVEL;  Surgeon: Reinaldo Meeker, MD;  Location: [facility];  Service: Neurosurgery;  Laterality: N/A;  ANTERIOR CERVICAL DECOMPRESSION/DISCECTOMY FUSION 1 LEVEL   BREAST LUMPECTOMY WITH RADIOACTIVE SEED AND SENTINEL LYMPH NODE BIOPSY Right 12/13/2019   Procedure: RIGHT BREAST LUMPECTOMY  WITH RADIOACTIVE SEED AND RIGHT AXILLARY  SENTINEL LYMPH NODE BIOPSY, RIGHT AXILLARY NODE SEED GUIDED EXCISION, BLUE DYE INJECTION;  Surgeon: Emelia Loron, MD;  Location: [facility];  Service: General;  Laterality: Right;  PEC BLOCK   BREAST REDUCTION SURGERY Bilateral 03/04/2021   Procedure: MAMMARY REDUCTION  (BREAST);  Surgeon: Contogiannis, Chales Abrahams, MD;  Location: [facility];  Service: Plastics;  Laterality: Bilateral;   CHALAZION EXCISION     L eye    PORTACATH PLACEMENT Left 07/13/2019   Procedure: INSERTION PORT-A-CATH WITH ULTRASOUND;  Surgeon: Emelia Loron, MD;  [facility];  Service: General;  Laterality: Left;   VAGINAL DELIVERY  2002   WISDOM TOOTH EXTRACTION       Social History:  reports that she quit smoking about 15 months ago. Her smoking use included cigarettes. She has a 10.00 pack-year smoking history. She has never used smokeless tobacco. She reports current alcohol use. She reports that she does not currently use drugs after having used the following drugs: Marijuana. Family History:  Family History  Problem Relation Age of Onset   Osteoarthritis Mother    Hypertension Mother    Diabetes Mother    Hypertension Father    Gout Father    Stroke Father 83       embolic   Diabetes Half-Brother    Hypertension Half-Brother    Heart failure Half-Brother 45   Kidney disease Half-Brother    Diabetes Paternal Grandfather    Cancer Paternal Grandfather 80       breast cancer     HOME MEDICATIONS: Allergies as of 09/23/2021       Reactions   Aspirin Rash        Medication List        Accurate as of September 23, 2021  4:10 PM. If you have any questions, ask your nurse or doctor.          STOP taking these medications    insulin aspart 100 UNIT/ML FlexPen Commonly known as: NOVOLOG Stopped by: Scarlette Shorts, MD   Janumet 50-500 MG tablet Generic drug: sitaGLIPtin-metformin Stopped by: Scarlette Shorts, MD       TAKE these medications    acetaminophen 500 MG tablet Commonly known as: TYLENOL Take 500-1,000 mg by mouth daily as needed for pain.   albuterol 108 (90 Base) MCG/ACT inhaler Commonly known as: VENTOLIN HFA Inhale 2 puffs into the lungs every 6 (six) hours as needed for wheezing or shortness of breath.   atorvastatin 10 MG tablet Commonly known as: Lipitor Take 1 tablet by mouth daily   baclofen 10 MG tablet Commonly known as: LIORESAL Take 1 tablet (10 mg total) by mouth 3 (three) times daily.   blood glucose meter kit and supplies Kit Dispense based on patient and insurance preference. Use up to four times daily as directed. (FOR ICD-137). For QAC - HS accuchecks.  Dispense any approved  glucometer with 1 month testing supplies.   buPROPion 150 MG 24 hr tablet Commonly known as: Wellbutrin XL Take 1 tablet (150 mg total) by mouth every morning.   carvedilol 6.25 MG tablet Commonly known as: COREG TAKE 1 TABLET BY MOUTH TWICE DAILY WITH A MEAL   dapagliflozin propanediol 10 MG Tabs tablet Commonly known as: Farxiga Take 1 tablet (10 mg total) by mouth daily. What changed: how much to take Changed by: Scarlette Shorts, MD   FreeStyle Libre 2 Sensor Misc USE AS DIRECTED TO  CHECK  BLOOD  SUGARS  5  TIMES  PER  DAY  AS  NEEDED   letrozole 2.5 MG tablet Commonly known as: FEMARA Take 1 tablet (2.5 mg total) by mouth daily.   Ozempic (0.25 or 0.5 MG/DOSE) 2 MG/3ML Sopn Generic drug: Semaglutide(0.25 or 0.5MG /DOS) Inject 0.5 mg into the skin once a week.         ALLERGIES: Allergies  Allergen Reactions   Aspirin Rash     REVIEW OF SYSTEMS: A comprehensive ROS was conducted with the patient and is negative except as per HPI and below:  Review of Systems  Gastrointestinal:  Negative for nausea and vomiting.  Neurological:  Negative for tingling.      OBJECTIVE:   VITAL SIGNS: BP 132/70 (BP Location: Left Arm, Patient Position: Sitting, Cuff Size: Large)   Pulse 78   Ht 5\' 5"  (1.651 m)   Wt 243 lb (110.2 kg)   LMP  (LMP Unknown) Comment: June 2022  SpO2 95%   BMI 40.44 kg/m    PHYSICAL EXAM:  General: Pt appears well and is in NAD  Neck: General: Supple without adenopathy or carotid bruits. Thyroid: Thyroid size normal.  No goiter or nodules appreciated.  Lungs: Clear with good BS bilat with no rales, rhonchi, or wheezes  Heart: RRR with normal S1 and S2 and no gallops; no murmurs; no rub  Abdomen: Normoactive bowel sounds, soft, nontender, without masses or organomegaly palpable  Extremities:  Lower extremities - No pretibial edema. No lesions.  Neuro: MS is good with appropriate affect, pt is alert and Ox3    DM foot exam:  09/23/2021  The skin of the feet is intact without sores or ulcerations. The pedal pulses are 2+ on right and 2+ on left. The sensation is intact to a screening 5.07, 10 gram monofilament bilaterally   DATA REVIEWED:  Lab Results  Component Value Date   HGBA1C 6.1 (A) 09/23/2021   HGBA1C 7.3 (H) 07/03/2021   HGBA1C 8.9 (H) 04/17/2021   Lab Results  Component Value Date   MICROALBUR 30 03/27/2021   LDLCALC 91 08/19/2021   CREATININE 0.98 08/19/2021   Lab Results  Component Value Date   MICRALBCREAT <30 03/27/2021    Lab Results  Component Value Date   CHOL 163 08/19/2021   HDL 37 (L) 08/19/2021   LDLCALC 91 08/19/2021   TRIG 205 (H) 08/19/2021   CHOLHDL 4.4 08/19/2021        Latest Reference Range & Units 08/19/21 16:44  Sodium 134 - 144 mmol/L 142  Potassium 3.5 - 5.2 mmol/L 4.5  Chloride 96 - 106 mmol/L 102  CO2 20 - 29 mmol/L 25  Glucose 70 - 99 mg/dL 409 (H)  BUN 6 - 24 mg/dL 9  Creatinine 8.11 - 9.14 mg/dL 7.82  Calcium 8.7 - 95.6 mg/dL 9.9  BUN/Creatinine Ratio 9 - 23  9  eGFR >59 mL/min/1.73 74  ALT 0 - 32 IU/L 13    Latest Reference Range & Units 08/19/21 16:44  Total CHOL/HDL Ratio 0.0 - 4.4 ratio 4.4  Cholesterol, Total 100 - 199 mg/dL 213  HDL Cholesterol >08 mg/dL 37 (L)  Triglycerides 0 - 149 mg/dL 657 (H)  VLDL Cholesterol Cal 5 - 40 mg/dL 35  LDL Chol Calc (NIH) 0 - 99 mg/dL 91      ASSESSMENT / PLAN / RECOMMENDATIONS:   1) Type 2 Diabetes Mellitus, Optimally controlled, Without complications - Most recent A1c of 6.1 %. Goal A1c <  7.0 %.    Plan: GENERAL: I have discussed with the patient the pathophysiology of diabetes. We went over the natural progression of the disease. We talked about both insulin resistance and insulin deficiency. We stressed the importance of lifestyle changes including diet and exercise. I explained the complications associated with diabetes including retinopathy, nephropathy, neuropathy as well as increased risk of  cardiovascular disease. We went over the benefit seen with glycemic control.  I explained to the patient that diabetic patients are at higher than normal risk for amputations. Ketosis-prone diabetes is an emerging heterogenous syndrome characterized by the presence of DKA in patient who may lack the typical clinical phenotype of autoimmune type 1 diabetes.  Unfortunately she continues with dietary indiscretions as she snacks up to 3 times a day and continues to drink sweet tea and cranberry juice. I have encouraged her on choosing low-carb snacks and avoiding sugar sweetened beverages as much as possible We also discussed that given her history of DKA, Marcelline Deist does increase risk of acidosis I will have low threshold of discontinuing this in the future At this time we will make no changes She was given a sample pen of Ozempic 0.5 mg I have encouraged her to exercise  MEDICATIONS: Continue Farxiga 10 mg daily Continue Ozempic 0.5 mg weekly  EDUCATION / INSTRUCTIONS: BG monitoring instructions: Patient is instructed to check her blood sugars 1 times a day, fasting. Call [facility] if: BG persistently < 70  I reviewed the Rule of 15 for the treatment of hypoglycemia in detail with the patient. Literature supplied.   2) Diabetic complications:  Eye: Does not have known diabetic retinopathy.  Neuro/ Feet: Does not have known diabetic peripheral neuropathy. Renal: Patient does not have known baseline CKD. She is not on an ACEI/ARB at present.   3) Dyslipidemia:   - Tg and LDL are trending down  - Discussed cardiovascular benefits of statins   Continue Atorvastatin 10 mg daily     Follow-up in 4 months   Signed electronically by: Lyndle Herrlich, MD  [facility]  [facility] [address] [place], [place] [address] Phone: [phone] FAX: [phone]   CC: Heidi Peng, MD [address] [place]  [place] [address] Phone: [phone]  Fax: [phone]    Return to Endocrinology clinic as below: Future Appointments  Date Time Provider Department Center  10/01/2021  4:20 PM Heidi Peng, MD [facility] None  10/07/2021  3:40 PM Heidi Peng, MD [facility] None  10/09/2021  3:45 PM [facility] None  11/10/2021  3:45 PM [facility] None  12/11/2021  3:45 PM [facility] None  01/09/2022  3:45 PM [facility] None  02/03/2022 11:30 AM Miara Emminger, Konrad Dolores, MD [facility] None  02/09/2022  3:45 PM [facility] None  02/25/2022  3:20 PM Heidi Peng, MD [facility] None  03/12/2022  3:00 PM Serena Croissant, MD [facility] None  03/12/2022  3:45 PM [facility] None

## 2021-09-24 ENCOUNTER — Encounter: Payer: Self-pay | Admitting: Internal Medicine

## 2021-10-01 ENCOUNTER — Encounter: Payer: Self-pay | Admitting: Internal Medicine

## 2021-10-01 ENCOUNTER — Ambulatory Visit (INDEPENDENT_AMBULATORY_CARE_PROVIDER_SITE_OTHER): Payer: BC Managed Care – PPO | Admitting: Internal Medicine

## 2021-10-01 DIAGNOSIS — Z6841 Body Mass Index (BMI) 40.0 and over, adult: Secondary | ICD-10-CM | POA: Diagnosis not present

## 2021-10-01 DIAGNOSIS — I10 Essential (primary) hypertension: Secondary | ICD-10-CM

## 2021-10-01 DIAGNOSIS — E119 Type 2 diabetes mellitus without complications: Secondary | ICD-10-CM

## 2021-10-01 DIAGNOSIS — E1169 Type 2 diabetes mellitus with other specified complication: Secondary | ICD-10-CM | POA: Diagnosis not present

## 2021-10-01 MED ORDER — SEMAGLUTIDE (1 MG/DOSE) 4 MG/3ML ~~LOC~~ SOPN: 1.0000 mg | PEN_INJECTOR | SUBCUTANEOUS | 1 refills | Status: DC

## 2021-10-01 NOTE — Patient Instructions (Signed)

## 2021-10-01 NOTE — Progress Notes (Signed)
[name],acting as a Education administrator for [name], MD.,have documented all relevant documentation on the behalf of [name], MD,as directed by  [name], MD while in the presence of [name], MD.  This visit occurred during the SARS-CoV-2 public health emergency.  Safety protocols were in place, including screening questions prior to the visit, additional usage of staff PPE, and extensive cleaning of exam room while observing appropriate contact time as indicated for disinfecting solutions.  Subjective:     Patient ID: Heidi Costa , female    DOB: 04-07-1980 , 41 y.o.   MRN: [id number]   Chief Complaint  Patient presents with   Diabetes   Hypertension    HPI  The patient is here today for a diabetes f/u. Patient reports compliance with her meds. She is on both Ozempic and [place]. She has not had any issues with either medication.   Diabetes She presents for her follow-up diabetic visit. She has type 2 diabetes mellitus. Pertinent negatives for hypoglycemia include no dizziness or headaches. Pertinent negatives for diabetes include no chest pain, no fatigue, no polydipsia, no polyphagia, no polyuria and no weakness. There are no hypoglycemic complications.  Hypertension This is a chronic problem. The problem has been gradually improving since onset. The problem is controlled. Pertinent negatives include no chest pain or headaches.     Past Medical History:  Diagnosis Date   Abnormality of gait 08/02/2012   resolved per patient on 07/12/19   Anemia    iron taking in the past, not currently   Anxiety    Arthritis    spine- cerv.  & arm    Asthma    Bulging lumbar disc    Cancer ([place])    Right Breast-starts chemo on 07/14/19   Chronic arthralgias of knees and hips 08/02/2012   Depression    Pt. took self off Celexa one month ago, states she did n't like how it made her feel .   Family history of cancer    GERD (gastroesophageal reflux disease)     diet controlled   Heart murmur    pt. been told that this is a fact, no echo or cardiac surveillance in the past    HLD (hyperlipidemia)    no meds, diet controlled   Hypertension    MRSA (methicillin resistant Staphylococcus aureus) 03/02/2005   Obesity    Spinal stenosis      Family History  Problem Relation Age of Onset   Osteoarthritis Mother    Hypertension Mother    Diabetes Mother    Hypertension Father    Gout Father    Stroke Father 70       embolic   Diabetes Half-Brother    Hypertension Half-Brother    Heart failure Half-Brother 15   Kidney disease Half-Brother    Diabetes Paternal Grandfather    Cancer Paternal Grandfather 40       breast cancer     Current Outpatient Medications:    acetaminophen (TYLENOL) 500 MG tablet, Take 500-1,000 mg by mouth daily as needed for pain., Disp: , Rfl:    albuterol (VENTOLIN HFA) 108 (90 Base) MCG/ACT inhaler, Inhale 2 puffs into the lungs every 6 (six) hours as needed for wheezing or shortness of breath., Disp: 18 g, Rfl: 2   atorvastatin (LIPITOR) 10 MG tablet, Take 1 tablet by mouth daily, Disp: 30 tablet, Rfl: 2   baclofen (LIORESAL) 10 MG tablet, Take 1 tablet (10 mg  total) by mouth 3 (three) times daily., Disp: 90 each, Rfl: 3   blood glucose meter kit and supplies KIT, Dispense based on patient and insurance preference. Use up to four times daily as directed. (FOR ICD-137). For QAC - HS accuchecks.  Dispense any approved glucometer with 1 month testing supplies., Disp: 1 each, Rfl: 1   buPROPion (WELLBUTRIN XL) 150 MG 24 hr tablet, Take 1 tablet (150 mg total) by mouth every morning., Disp: 30 tablet, Rfl: 3   carvedilol (COREG) 6.25 MG tablet, TAKE 1 TABLET BY MOUTH TWICE DAILY WITH A MEAL, Disp: 180 tablet, Rfl: 0   Continuous Blood Gluc Sensor (FREESTYLE LIBRE 2 SENSOR) MISC, USE AS DIRECTED TO  CHECK  BLOOD  SUGARS  5  TIMES  PER  DAY  AS  NEEDED, Disp: 2 each, Rfl: 0   dapagliflozin propanediol (FARXIGA) 10 MG TABS  tablet, Take 1 tablet (10 mg total) by mouth daily., Disp: 90 tablet, Rfl: 3   letrozole (FEMARA) 2.5 MG tablet, Take 1 tablet (2.5 mg total) by mouth daily., Disp: 90 tablet, Rfl: 3   Semaglutide, 1 MG/DOSE, 4 MG/3ML SOPN, Inject 1 mg into the skin once a week., Disp: 3 mL, Rfl: 1   Allergies  Allergen Reactions   Aspirin Rash     Review of Systems  Constitutional: Negative.  Negative for fatigue.  Respiratory: Negative.    Cardiovascular: Negative.  Negative for chest pain.  Gastrointestinal: Negative.   Endocrine: Negative for polydipsia, polyphagia and polyuria.  Neurological: Negative.  Negative for dizziness, weakness and headaches.  Psychiatric/Behavioral: Negative.       Today's Vitals   10/01/21 1602  BP: 124/86  Pulse: 76  Temp: 98.3 F (36.8 C)  Weight: 243 lb 3.2 oz (110.3 kg)  Height: $Remove'5\' 5"'UhucIXZ$  (1.651 m)  PainSc: 0-No pain   Body mass index is 40.47 kg/m.  Wt Readings from Last 3 Encounters:  10/01/21 243 lb 3.2 oz (110.3 kg)  09/23/21 243 lb (110.2 kg)  08/19/21 247 lb 3.2 oz (112.1 kg)    Objective:  Physical Exam Vitals and nursing note reviewed.  Constitutional:      Appearance: Normal appearance. She is obese.  HENT:     Head: Normocephalic and atraumatic.  Eyes:     Extraocular Movements: Extraocular movements intact.  Cardiovascular:     Rate and Rhythm: Normal rate and regular rhythm.     Heart sounds: Normal heart sounds.  Pulmonary:     Effort: Pulmonary effort is normal.     Breath sounds: Normal breath sounds.  Musculoskeletal:     Cervical back: Normal range of motion.  Skin:    General: Skin is warm.  Neurological:     General: No focal deficit present.     Mental Status: She is alert.  Psychiatric:        Mood and Affect: Mood normal.        Behavior: Behavior normal.      Assessment And Plan:     1. Type 2 diabetes mellitus with morbid obesity ([place]) Comments: Chronic, now followed by Endo. Their input is appreciated. She will  c/w current meds.  She was congratulated on her 4lb weight loss since May 2023.   2. Essential hypertension, benign Comments: Chronic, well controlled. She is encouraged to follow low sodium diet.  She will c/w carvedilol twice daily.  She will f/u in 4 months.   3. Class 3 severe obesity due to excess calories with serious comorbidity  and body mass index (BMI) of 40.0 to 44.9 in adult [facility]) Comments: BMI 40. She is encouraged to aim for at least 150 minutes of exercise per week.   Patient was given opportunity to ask questions. Patient verbalized understanding of the plan and was able to repeat key elements of the plan. All questions were answered to their satisfaction.   I, [name], MD, have reviewed all documentation for this visit. The documentation on 10/14/21 for the exam, diagnosis, procedures, and orders are all accurate and complete.   IF YOU HAVE BEEN REFERRED TO A SPECIALIST, IT MAY TAKE 1-2 WEEKS TO SCHEDULE/PROCESS THE REFERRAL. IF YOU HAVE NOT HEARD FROM US/SPECIALIST IN TWO WEEKS, PLEASE GIVE [place] A CALL AT [phone].   THE PATIENT IS ENCOURAGED TO PRACTICE SOCIAL DISTANCING DUE TO THE COVID-19 PANDEMIC.

## 2021-10-07 ENCOUNTER — Ambulatory Visit: Payer: BC Managed Care – PPO | Admitting: Internal Medicine

## 2021-10-09 ENCOUNTER — Other Ambulatory Visit: Payer: Self-pay

## 2021-10-09 ENCOUNTER — Inpatient Hospital Stay: Payer: BC Managed Care – PPO | Attending: Hematology and Oncology

## 2021-10-09 VITALS — BP 144/88 | HR 54 | Temp 97.8°F | Resp 17

## 2021-10-09 DIAGNOSIS — C50411 Malignant neoplasm of upper-outer quadrant of right female breast: Secondary | ICD-10-CM | POA: Diagnosis not present

## 2021-10-09 DIAGNOSIS — Z5111 Encounter for antineoplastic chemotherapy: Secondary | ICD-10-CM | POA: Insufficient documentation

## 2021-10-09 DIAGNOSIS — Z95828 Presence of other vascular implants and grafts: Secondary | ICD-10-CM

## 2021-10-09 DIAGNOSIS — Z170 Estrogen receptor positive status [ER+]: Secondary | ICD-10-CM

## 2021-10-09 MED ORDER — GOSERELIN ACETATE 3.6 MG ~~LOC~~ IMPL
3.6000 mg | DRUG_IMPLANT | Freq: Once | SUBCUTANEOUS | Status: AC
  Administered 2021-10-09: 3.6 mg via SUBCUTANEOUS
  Filled 2021-10-09: qty 3.6

## 2021-10-15 ENCOUNTER — Other Ambulatory Visit: Payer: Self-pay

## 2021-10-15 MED ORDER — FLUCONAZOLE 150 MG PO TABS: 150.0000 mg | ORAL_TABLET | Freq: Every day | ORAL | 0 refills | Status: DC

## 2021-10-23 ENCOUNTER — Other Ambulatory Visit: Payer: Self-pay

## 2021-10-23 MED ORDER — SEMAGLUTIDE (1 MG/DOSE) 4 MG/3ML ~~LOC~~ SOPN: 1.0000 mg | PEN_INJECTOR | SUBCUTANEOUS | 1 refills | Status: DC

## 2021-11-10 ENCOUNTER — Inpatient Hospital Stay: Payer: BC Managed Care – PPO | Attending: Hematology and Oncology

## 2021-11-10 ENCOUNTER — Other Ambulatory Visit: Payer: Self-pay

## 2021-11-10 VITALS — BP 122/88 | HR 76 | Temp 98.4°F | Resp 18

## 2021-11-10 DIAGNOSIS — Z170 Estrogen receptor positive status [ER+]: Secondary | ICD-10-CM | POA: Diagnosis not present

## 2021-11-10 DIAGNOSIS — C50411 Malignant neoplasm of upper-outer quadrant of right female breast: Secondary | ICD-10-CM | POA: Insufficient documentation

## 2021-11-10 DIAGNOSIS — Z5111 Encounter for antineoplastic chemotherapy: Secondary | ICD-10-CM | POA: Insufficient documentation

## 2021-11-10 DIAGNOSIS — Z95828 Presence of other vascular implants and grafts: Secondary | ICD-10-CM

## 2021-11-10 MED ORDER — GOSERELIN ACETATE 3.6 MG ~~LOC~~ IMPL
3.6000 mg | DRUG_IMPLANT | Freq: Once | SUBCUTANEOUS | Status: AC
  Administered 2021-11-10: 3.6 mg via SUBCUTANEOUS
  Filled 2021-11-10: qty 3.6

## 2021-11-19 ENCOUNTER — Other Ambulatory Visit: Payer: Self-pay | Admitting: *Deleted

## 2021-11-19 ENCOUNTER — Telehealth: Payer: Self-pay | Admitting: *Deleted

## 2021-11-19 DIAGNOSIS — Z170 Estrogen receptor positive status [ER+]: Secondary | ICD-10-CM

## 2021-11-19 NOTE — Telephone Encounter (Signed)
Received call from pt stating US of right breast was completed yesterday after mammogram.  RN was able to obtain copy of [place] which showed right breast 6.7 cm seroma at the 9-12 o'clock position. Pt states breast is very sore and per MD pt needing to f/u with Dr. [name] for possible drainage and further evaluation/ treatment.  RN educated pt to contact [facility] [place] [facility] and RN also sent in basket to Dr. Cristal Generous RN regarding the need for appt.  Pt verbalized understanding.

## 2021-11-19 NOTE — Progress Notes (Signed)
Received recent mammogram report from [facility] showing 6.1 cm x 4.8 cm oval mass in right breast resembling seroma with recommendations to f/u with breast US.  Verbal orders received from MD for pt to have right breast US for further evaluation.  Orders placed and faxed to [facility]. RN attempt x1 to contact pt with information.  No answer, LVM for pt to return call to the office.

## 2021-11-25 IMAGING — MR MR BREAST BILAT WO/W CM
4 of 6 series · 32 of 48 positions shown · IV contrast (gadavist)
Comparison: Previous exam(s).

CLINICAL DATA: 38-year-old female with newly diagnosed right breast
cancer metastatic to a right axillary lymph node.

LABS:  None performed on site.
EXAM:
BILATERAL BREAST MRI WITH AND WITHOUT CONTRAST
TECHNIQUE: Multiplanar, multisequence MR images of both breasts were obtained
prior to and following the intravenous administration of 10 ml of
Gadavist.

[Series 3: fl3d pre-cm no · axial · non-contrast · 1.2mm · 1.09mm/px · z∈[-86,+105]mm · 11 of 158 slices shown]
[im 1/158]
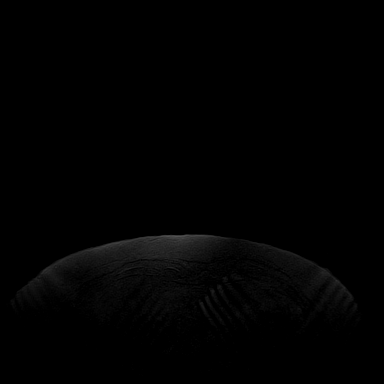
[im 16/158]
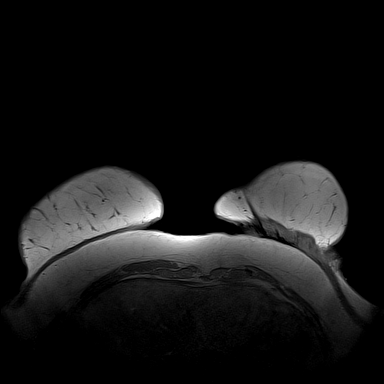
[im 32/158]
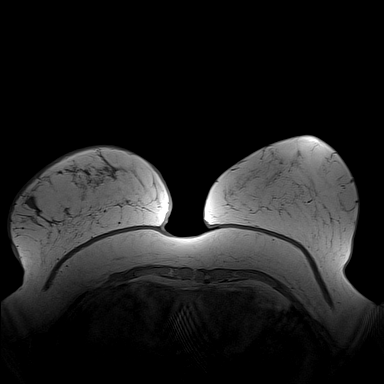
[im 48/158]
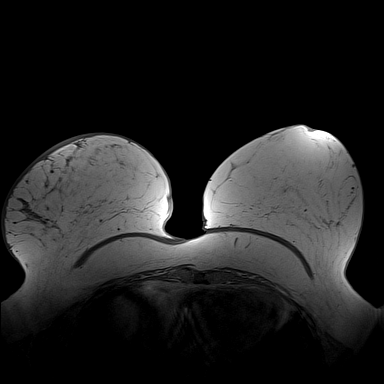
[im 63/158]
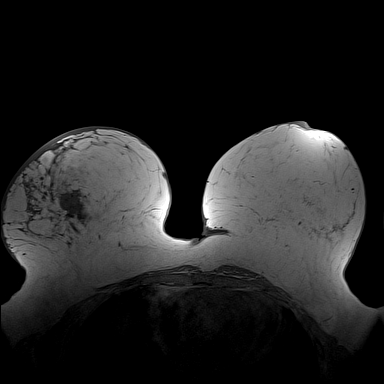
[im 79/158]
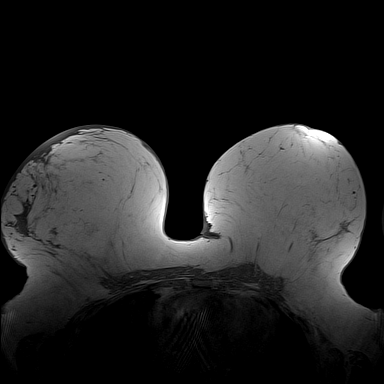
[im 95/158]
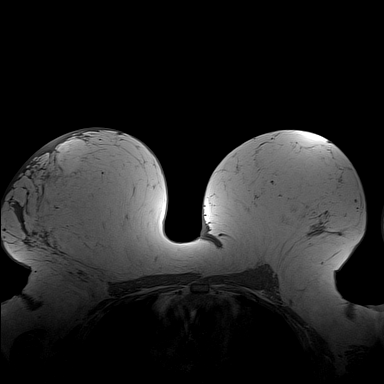
[im 110/158]
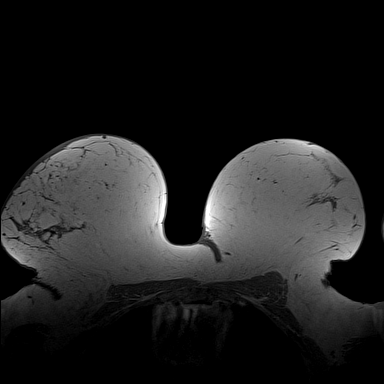
[im 126/158]
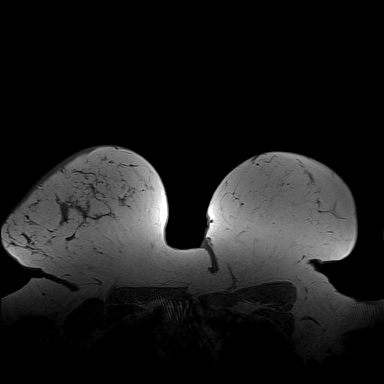
[im 142/158]
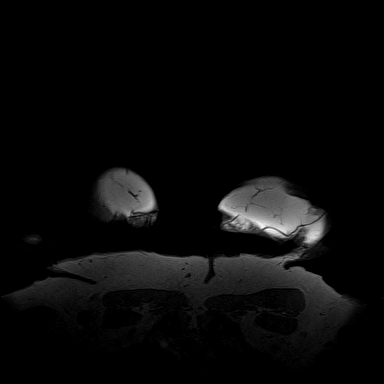
[im 158/158]
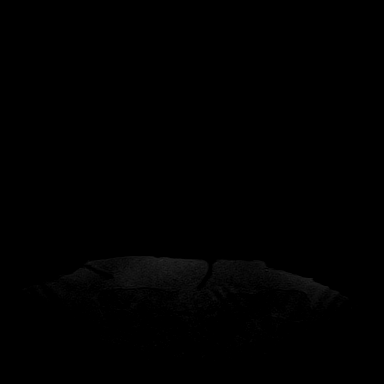

[Series 6: fl3d post-cm 20 · axial · 1.2mm · 1.15mm/px · z∈[-86,+105]mm · 11 of 159 slices shown (1 of 2)]
[im 1/159]
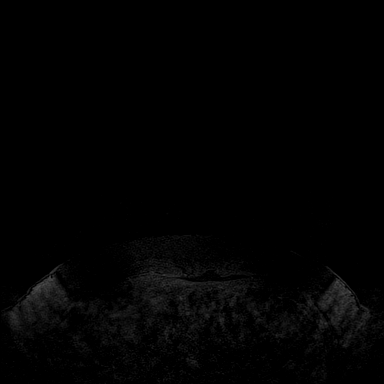
[im 16/159]
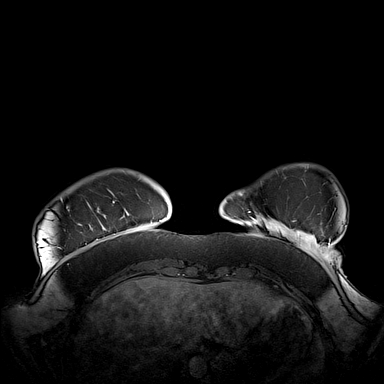
[im 32/159]
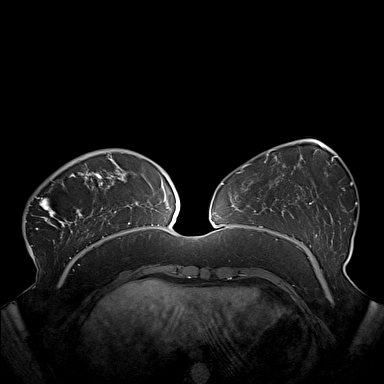
[im 48/159]
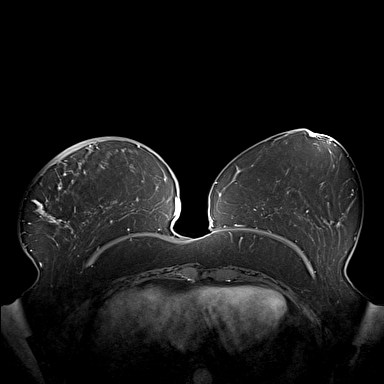
[im 64/159]
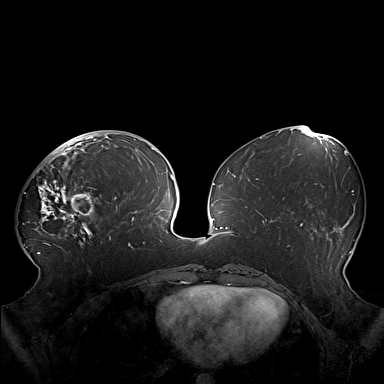
[im 80/159]
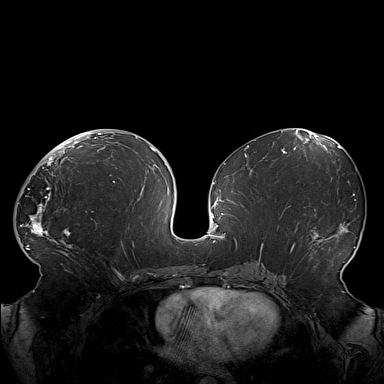
[im 95/159]
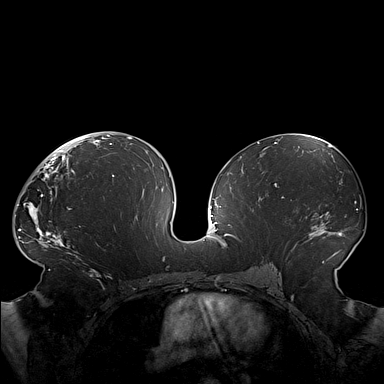
[im 111/159]
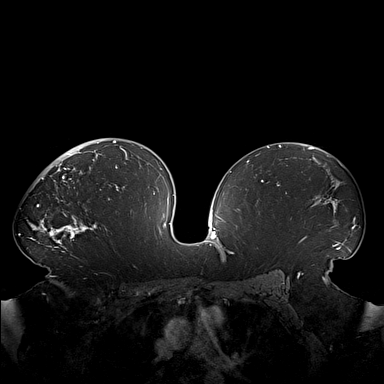
[im 127/159]
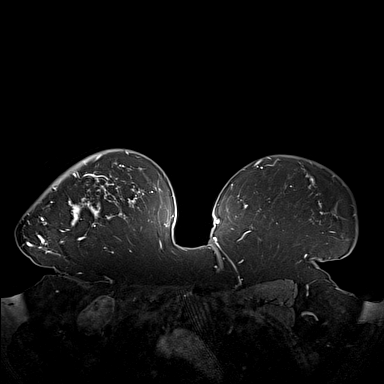
[im 143/159]
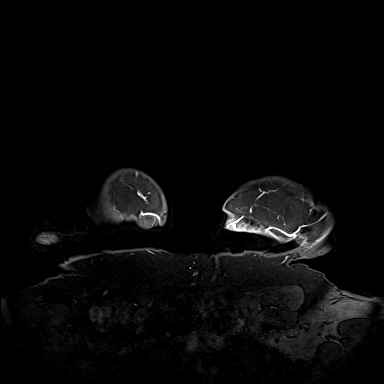
[im 159/159]
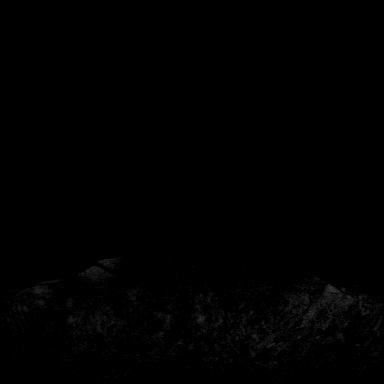

[Series 8: fl3d post-cm 20 · axial · 192.0mm · 1.15mm/px · 1 of 1 slices shown (2 of 2)]
[im 1/1]
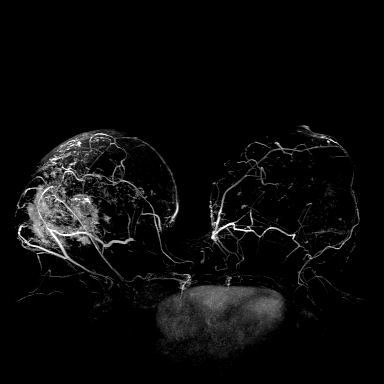

[Series 9: fl3d post-cm 3min · axial · 1.2mm · 1.15mm/px · z∈[-86,+105]mm · 9 of 159 slices shown]
[im 1/159]
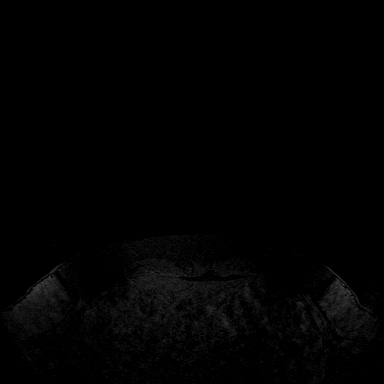
[im 29/159]
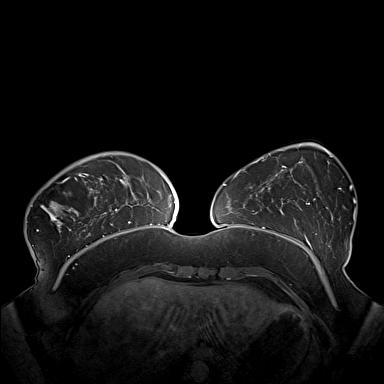
[im 44/159]
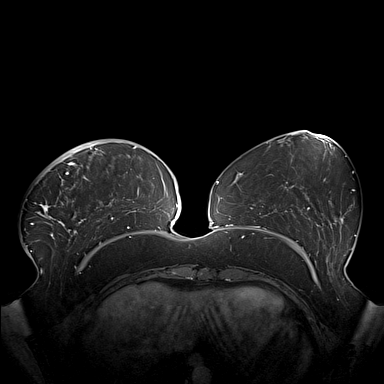
[im 72/159]
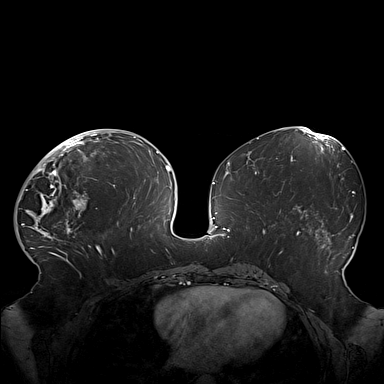
[im 87/159]
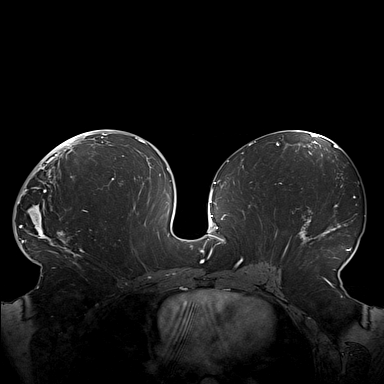
[im 115/159]
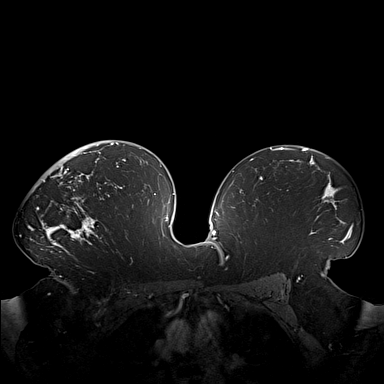
[im 130/159]
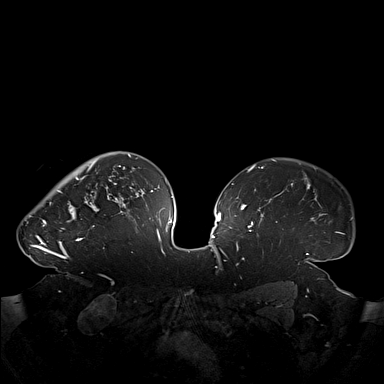
[im 144/159]
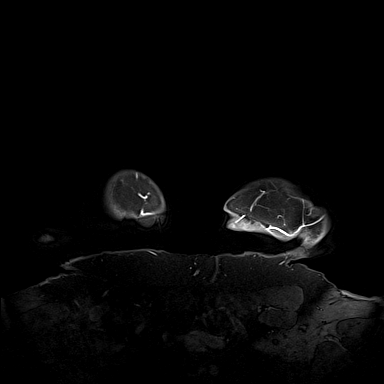
[im 159/159]
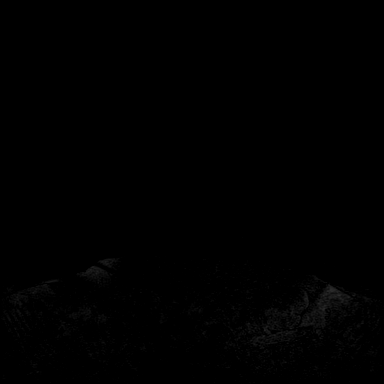

[32 of 48 positions shown; findings below may reference images not displayed]

Three-dimensional MR images were rendered by post-processing of the
original MR data on an independent workstation. The
three-dimensional MR images were interpreted, and findings are
reported in the following complete MRI report for this study. Three
dimensional images were evaluated at the independent [employer]
workstation
FINDINGS: Breast composition: b. Scattered fibroglandular tissue.

Background parenchymal enhancement: Mild to moderate.

Right breast: Susceptibility artifact is seen in association with a
peripherally enhancing mass in the central, slightly lateral right
breast at middle depth (series 7, image 97/160). This is consistent
with the patient's biopsy-proven site of malignancy. The mass
measures 2.9 x 2.2 x 2.3 cm with areas of central necrosis.

Highly suspicious non mass enhancement is seen involving the entire
lateral right breast from anterior to posterior depth. Overall this
spans approximately 12.8 x 9.8 x 14.5 cm (AP by transverse by
craniocaudal dimensions).

Left breast: No suspicious mass or abnormal enhancement.
Susceptibility artifact from a post biopsy clip is noted along the
far inferior, medial aspect.

Lymph nodes: A 5.5 cm markedly abnormal level 1 right axillary lymph
node is consistent with the previously biopsied node. No additional
suspicious lymphadenopathy is noted within the limitations of the
field of view. No suspicious left axillary or internal mammary chain
lymphadenopathy.

Ancillary findings:  None.
IMPRESSION: 1. 2.9 cm centrally necrotic right breast mass consistent with the
patient's biopsy-proven malignancy.
2. Highly suspicious non-mass enhancement involving the entire
lateral right breast from anterior to posterior depth, spanning
x 9.8 x 14.5 cm. If breast conservation therapy is a consideration,
recommend two additional sites of MRI guided biopsy to document
extent of disease.
3. Biopsy-proven metastatic disease in the right axilla.
4. No MRI evidence of malignancy on the left.

RECOMMENDATION:
If breast conservation therapy is a consideration, additional MRI
guided biopsy is recommended at two distant sites of right breast
non-mass enhancement to document extent of disease.

BI-RADS CATEGORY  4: Suspicious.

## 2021-11-26 ENCOUNTER — Ambulatory Visit: Payer: BC Managed Care – PPO | Admitting: Internal Medicine

## 2021-12-11 ENCOUNTER — Inpatient Hospital Stay: Payer: BC Managed Care – PPO | Attending: Hematology and Oncology

## 2021-12-11 VITALS — BP 124/79 | HR 59 | Temp 98.2°F | Resp 16

## 2021-12-11 DIAGNOSIS — C50411 Malignant neoplasm of upper-outer quadrant of right female breast: Secondary | ICD-10-CM | POA: Insufficient documentation

## 2021-12-11 DIAGNOSIS — Z170 Estrogen receptor positive status [ER+]: Secondary | ICD-10-CM | POA: Diagnosis not present

## 2021-12-11 DIAGNOSIS — Z5111 Encounter for antineoplastic chemotherapy: Secondary | ICD-10-CM | POA: Diagnosis not present

## 2021-12-11 DIAGNOSIS — Z95828 Presence of other vascular implants and grafts: Secondary | ICD-10-CM

## 2021-12-11 MED ORDER — GOSERELIN ACETATE 3.6 MG ~~LOC~~ IMPL
3.6000 mg | DRUG_IMPLANT | Freq: Once | SUBCUTANEOUS | Status: AC
  Administered 2021-12-11: 3.6 mg via SUBCUTANEOUS
  Filled 2021-12-11: qty 3.6

## 2021-12-11 NOTE — Patient Instructions (Signed)
Goserelin Implant What is this medication? GOSERELIN (GOE se rel in) treats prostate cancer and breast cancer. It works by decreasing levels of the hormones testosterone and estrogen in the body. This prevents prostate and breast cancer cells from spreading or growing. It may also be used to treat endometriosis. This is a condition where the tissue that lines the uterus grows outside the uterus. It works by decreasing the amount of estrogen your body makes, which reduces heavy bleeding and pain. It can also be used to help thin the lining of the uterus before a surgery used to prevent or reduce heavy periods. This medicine may be used for other purposes; ask your health care provider or pharmacist if you have questions. COMMON BRAND NAME(S): Zoladex, Zoladex 3-Month What should I tell my care team before I take this medication? They need to know if you have any of these conditions: Bone problems Diabetes Heart disease History of irregular heartbeat or rhythm An unusual or allergic reaction to goserelin, other medications, foods, dyes, or preservatives Pregnant or trying to get pregnant Breastfeeding How should I use this medication? This medication is injected under the skin. It is given by your care team in a hospital or clinic setting. Talk to your care team about the use of this medication in children. Special care may be needed. Overdosage: If you think you have taken too much of this medicine contact a poison control center or emergency room at once. NOTE: This medicine is only for you. Do not share this medicine with others. What if I miss a dose? Keep appointments for follow-up doses. It is important not to miss your dose. Call your care team if you are unable to keep an appointment. What may interact with this medication? Do not take this medication with any of the following: Cisapride Dronedarone Pimozide Thioridazine This medication may also interact with the following: Other  medications that cause heart rhythm changes This list may not describe all possible interactions. Give your health care provider a list of all the medicines, herbs, non-prescription drugs, or dietary supplements you use. Also tell them if you smoke, drink alcohol, or use illegal drugs. Some items may interact with your medicine. What should I watch for while using this medication? Visit your care team for regular checks on your progress. Your symptoms may appear to get worse during the first weeks of this therapy. Tell your care team if your symptoms do not start to get better or if they get worse after this time. Using this medication for a long time may weaken your bones. If you smoke or frequently drink alcohol you may increase your risk of bone loss. A family history of osteoporosis, chronic use of medications for seizures (convulsions), or corticosteroids can also increase your risk of bone loss. The risk of bone fractures may be increased. Talk to your care team about your bone health. This medication may increase blood sugar. The risk may be higher in patients who already have diabetes. Ask your care team what you can do to lower your risk of diabetes while taking this medication. This medication should stop regular monthly menstruation in women. Tell your care team if you continue to menstruate. Talk to your care team if you wish to become pregnant or think you might be pregnant. This medication can cause serious birth defects if taken during pregnancy or for 12 weeks after stopping treatment. Talk to your care team about reliable forms of contraception. Do not breastfeed while taking this   medication. This medication may cause infertility. Talk to your care team if you are concerned about your fertility. What side effects may I notice from receiving this medication? Side effects that you should report to your care team as soon as possible: Allergic reactions--skin rash, itching, hives, swelling  of the face, lips, tongue, or throat Change in the amount of urine Heart attack--pain or tightness in the chest, shoulders, arms, or jaw, nausea, shortness of breath, cold or clammy skin, feeling faint or lightheaded Heart rhythm changes--fast or irregular heartbeat, dizziness, feeling faint or lightheaded, chest pain, trouble breathing High blood sugar (hyperglycemia)--increased thirst or amount of urine, unusual weakness or fatigue, blurry vision High calcium level--increased thirst or amount of urine, nausea, vomiting, confusion, unusual weakness or fatigue, bone pain Pain, redness, irritation, or bruising at the injection site Severe back pain, numbness or weakness of the hands, arms, legs, or feet, loss of coordination, loss of bowel or bladder control Stroke--sudden numbness or weakness of the face, arm, or leg, trouble speaking, confusion, trouble walking, loss of balance or coordination, dizziness, severe headache, change in vision Swelling and pain of the tumor site or lymph nodes Trouble passing urine Side effects that usually do not require medical attention (report to your care team if they continue or are bothersome): Change in sex drive or performance Headache Hot flashes Rapid or extreme change in emotion or mood Sweating Swelling of the ankles, hands, or feet Unusual vaginal discharge, itching, or odor This list may not describe all possible side effects. Call your doctor for medical advice about side effects. You may report side effects to FDA at 1-800-FDA-1088. Where should I keep my medication? This medication is given in a hospital or clinic. It will not be stored at home. NOTE: This sheet is a summary. It may not cover all possible information. If you have questions about this medicine, talk to your doctor, pharmacist, or health care provider.  2023 Elsevier/Gold Standard (2021-07-30 00:00:00)  

## 2021-12-18 ENCOUNTER — Other Ambulatory Visit: Payer: Self-pay | Admitting: Internal Medicine

## 2021-12-18 DIAGNOSIS — F320 Major depressive disorder, single episode, mild: Secondary | ICD-10-CM

## 2021-12-19 ENCOUNTER — Other Ambulatory Visit: Payer: Self-pay

## 2021-12-19 MED ORDER — GLUCOSE BLOOD VI STRP: ORAL_STRIP | 12 refills | Status: DC

## 2021-12-22 ENCOUNTER — Other Ambulatory Visit: Payer: Self-pay

## 2021-12-22 MED ORDER — ACCU-CHEK GUIDE W/DEVICE KIT: PACK | 0 refills | Status: AC

## 2021-12-23 ENCOUNTER — Other Ambulatory Visit: Payer: Self-pay

## 2021-12-23 MED ORDER — FLUCONAZOLE 150 MG PO TABS: ORAL_TABLET | ORAL | 0 refills | Status: DC

## 2022-01-07 ENCOUNTER — Ambulatory Visit (INDEPENDENT_AMBULATORY_CARE_PROVIDER_SITE_OTHER): Payer: BC Managed Care – PPO | Admitting: Internal Medicine

## 2022-01-07 ENCOUNTER — Other Ambulatory Visit (HOSPITAL_COMMUNITY)
Admission: RE | Admit: 2022-01-07 | Discharge: 2022-01-07 | Disposition: A | Discharge status: Home / Self Care | Payer: BC Managed Care – PPO | Source: Ambulatory Visit | Attending: Internal Medicine | Admitting: Internal Medicine

## 2022-01-07 ENCOUNTER — Encounter: Payer: Self-pay | Admitting: Internal Medicine

## 2022-01-07 DIAGNOSIS — Z23 Encounter for immunization: Secondary | ICD-10-CM

## 2022-01-07 DIAGNOSIS — E66812 Obesity, class 2: Secondary | ICD-10-CM

## 2022-01-07 DIAGNOSIS — Z6838 Body mass index (BMI) 38.0-38.9, adult: Secondary | ICD-10-CM

## 2022-01-07 DIAGNOSIS — I10 Essential (primary) hypertension: Secondary | ICD-10-CM

## 2022-01-07 DIAGNOSIS — Z113 Encounter for screening for infections with a predominantly sexual mode of transmission: Secondary | ICD-10-CM | POA: Insufficient documentation

## 2022-01-07 DIAGNOSIS — E1169 Type 2 diabetes mellitus with other specified complication: Secondary | ICD-10-CM | POA: Diagnosis not present

## 2022-01-07 DIAGNOSIS — E6609 Other obesity due to excess calories: Secondary | ICD-10-CM

## 2022-01-07 DIAGNOSIS — Z862 Personal history of diseases of the blood and blood-forming organs and certain disorders involving the immune mechanism: Secondary | ICD-10-CM | POA: Diagnosis not present

## 2022-01-07 DIAGNOSIS — R5383 Other fatigue: Secondary | ICD-10-CM

## 2022-01-07 MED ORDER — CYANOCOBALAMIN 1000 MCG/ML IJ SOLN
1000.0000 ug | Freq: Once | INTRAMUSCULAR | Status: AC
  Administered 2022-01-07: 1000 ug via INTRAMUSCULAR

## 2022-01-07 NOTE — Patient Instructions (Signed)

## 2022-01-07 NOTE — Progress Notes (Signed)
Heidi Costa,acting as a Education administrator for Heidi Greenland, MD.,have documented all relevant documentation on the behalf of Heidi Greenland, MD,as directed by  Heidi Greenland, MD while in the presence of Heidi Greenland, MD.    Subjective:     Patient ID: Heidi Costa , female    DOB: 1981/02/29 , 41 y.o.   MRN: [id number]   Chief Complaint  Patient presents with   Diabetes   Hypertension   Weight Check    HPI  The patient is here today for a diabetes f/u. Patient reports compliance with her meds. She is on both Ozempic and [place]. She has not had any issues with either medication.   Diabetes She presents for her follow-up diabetic visit. She has type 2 diabetes mellitus. Her disease course has been stable. Pertinent negatives for hypoglycemia include no dizziness or headaches. Associated symptoms include fatigue. Pertinent negatives for diabetes include no chest pain, no polydipsia, no polyphagia, no polyuria and no weakness. There are no hypoglycemic complications. Risk factors for coronary artery disease include diabetes mellitus and obesity.  Hypertension This is a chronic problem. The problem has been gradually improving since onset. The problem is controlled. Pertinent negatives include no chest pain or headaches.     Past Medical History:  Diagnosis Date   Abnormality of gait 08/02/2012   resolved per patient on 07/12/19   Anemia    iron taking in the past, not currently   Anxiety    Arthritis    spine- cerv.  & arm    Asthma    Bulging lumbar disc    Cancer ([place])    Right Breast-starts chemo on 07/14/19   Chronic arthralgias of knees and hips 08/02/2012   Depression    Pt. took self off Celexa one month ago, states she did n't like how it made her feel .   Family history of cancer    GERD (gastroesophageal reflux disease)    diet controlled   Heart murmur    pt. been told that this is a fact, no echo or cardiac surveillance in the past    HLD  (hyperlipidemia)    no meds, diet controlled   Hypertension    MRSA (methicillin resistant Staphylococcus aureus) 03/02/2005   Obesity    Spinal stenosis      Family History  Problem Relation Age of Onset   Osteoarthritis Mother    Hypertension Mother    Diabetes Mother    Hypertension Father    Gout Father    Stroke Father 54       embolic   Diabetes Half-Brother    Hypertension Half-Brother    Heart failure Half-Brother 34   Kidney disease Half-Brother    Diabetes Paternal Grandfather    Cancer Paternal Grandfather 61       breast cancer     Current Outpatient Medications:    acetaminophen (TYLENOL) 500 MG tablet, Take 500-1,000 mg by mouth daily as needed for pain., Disp: , Rfl:    albuterol (VENTOLIN HFA) 108 (90 Base) MCG/ACT inhaler, Inhale 2 puffs into the lungs every 6 (six) hours as needed for wheezing or shortness of breath., Disp: 18 g, Rfl: 2   atorvastatin (LIPITOR) 10 MG tablet, Take 1 tablet by mouth daily, Disp: 30 tablet, Rfl: 2   baclofen (LIORESAL) 10 MG tablet, Take 1 tablet (10 mg total) by mouth 3 (three) times daily., Disp: 90 each, Rfl: 3   blood glucose meter kit and  supplies KIT, Dispense based on patient and insurance preference. Use up to four times daily as directed. (FOR ICD-137). For QAC - HS accuchecks.  Dispense any approved glucometer with 1 month testing supplies., Disp: 1 each, Rfl: 1   Blood Glucose Monitoring Suppl (ACCU-CHEK GUIDE) w/Device KIT, Use to check blood sugar 2 times daily, Disp: 1 kit, Rfl: 0   buPROPion (WELLBUTRIN XL) 150 MG 24 hr tablet, TAKE 1 TABLET BY MOUTH ONCE DAILY IN THE MORNING, Disp: 90 tablet, Rfl: 0   carvedilol (COREG) 6.25 MG tablet, TAKE 1 TABLET BY MOUTH TWICE DAILY WITH A MEAL, Disp: 180 tablet, Rfl: 0   Continuous Blood Gluc Sensor (FREESTYLE LIBRE 2 SENSOR) MISC, USE AS DIRECTED TO  CHECK  BLOOD  SUGARS  5  TIMES  PER  DAY  AS  NEEDED, Disp: 2 each, Rfl: 0   dapagliflozin propanediol (FARXIGA) 10 MG TABS  tablet, Take 1 tablet (10 mg total) by mouth daily., Disp: 90 tablet, Rfl: 3   glucose blood test strip, Use as instructed, Disp: 100 each, Rfl: 12   letrozole (FEMARA) 2.5 MG tablet, Take 1 tablet (2.5 mg total) by mouth daily., Disp: 90 tablet, Rfl: 3   Semaglutide, 1 MG/DOSE, 4 MG/3ML SOPN, Inject 1 mg into the skin once a week., Disp: 3 mL, Rfl: 1   nitrofurantoin, macrocrystal-monohydrate, (MACROBID) 100 MG capsule, Take 1 capsule (100 mg total) by mouth 2 (two) times daily for 5 days., Disp: 10 capsule, Rfl: 0   Allergies  Allergen Reactions   Aspirin Rash     Review of Systems  Constitutional:  Positive for fatigue.       She c/o worsening fatigue. States she is more tired than usual.  She is not sure what is contributing to her sx. Denies change in appetite/sleep habits.  Respiratory: Negative.    Cardiovascular: Negative.  Negative for chest pain.  Gastrointestinal: Negative.   Endocrine: Negative for polydipsia, polyphagia and polyuria.  Neurological: Negative.  Negative for dizziness, weakness and headaches.  Psychiatric/Behavioral: Negative.       Today's Vitals   01/07/22 1546  BP: 136/68  Pulse: 95  Temp: 98.1 F (36.7 C)  Weight: 228 lb 6.4 oz (103.6 kg)  Height: 5' 5"  (1.651 m)  PainSc: 0-No pain   Body mass index is 38.01 kg/m.  Wt Readings from Last 3 Encounters:  01/07/22 228 lb 6.4 oz (103.6 kg)  10/01/21 243 lb 3.2 oz (110.3 kg)  09/23/21 243 lb (110.2 kg)    Objective:  Physical Exam Vitals and nursing note reviewed.  Constitutional:      Appearance: Normal appearance. She is obese.  HENT:     Head: Normocephalic and atraumatic.     Nose:     Comments: Masked     Mouth/Throat:     Comments: Masked  Eyes:     Extraocular Movements: Extraocular movements intact.  Cardiovascular:     Rate and Rhythm: Normal rate and regular rhythm.     Heart sounds: Normal heart sounds.  Pulmonary:     Effort: Pulmonary effort is normal.     Breath sounds:  Normal breath sounds.  Musculoskeletal:     Cervical back: Normal range of motion.  Skin:    General: Skin is warm.  Neurological:     General: No focal deficit present.     Mental Status: She is alert.  Psychiatric:        Mood and Affect: Mood normal.  Behavior: Behavior normal.      Assessment And Plan:     1. Type 2 diabetes mellitus with morbid obesity ([place]) Comments: Chronic, I will check labs as below. She is encouraged to aim for at least 150 minutes of exercise per week. She will f/u in 3-4 months.  - Hemoglobin A1c - CMP14+EGFR - Microalbumin / Creatinine Urine Ratio  2. Essential hypertension, benign Comments: Chronic, fair control. Goal BP<130/80.  She will c/w carvedilol twice daily.  She is encouraged to follow a low sodium diet.   3. Other fatigue Comments: I will check labs as below. She is encouraged to stay well hydrated.  - CBC no Diff - Vitamin B12 - TSH  4. Class 2 severe obesity due to excess calories with serious comorbidity and body mass index (BMI) of 38.0 to 38.9 in adult [facility]) Comments: She was congratulated on her 15lb weight loss since July 2023. She is encouraged to aim for at least 150 minutes of exercise per week.  5. Screening for STDs (sexually transmitted diseases) Comments: I will perform STD screen per patient's request.  - Urine cytology ancillary only - Hepatitis B surface antigen - HIV Antibody (routine testing w rflx) - RPR - Hepatitis C antibody  6. Immunization due - Flu Vaccine QUAD 6+ mos PF IM (Fluarix Quad PF)  7. History of anemia due to vitamin B12 deficiency - cyanocobalamin (VITAMIN B12) injection 1,000 mcg   Patient was given opportunity to ask questions. Patient verbalized understanding of the plan and was able to repeat key elements of the plan. All questions were answered to their satisfaction.   I, Heidi Greenland, MD, have reviewed all documentation for this visit. The documentation on 01/11/22 for the  exam, diagnosis, procedures, and orders are all accurate and complete.   IF YOU HAVE BEEN REFERRED TO A SPECIALIST, IT MAY TAKE 1-2 WEEKS TO SCHEDULE/PROCESS THE REFERRAL. IF YOU HAVE NOT HEARD FROM US/SPECIALIST IN TWO WEEKS, PLEASE GIVE [place] A CALL AT [phone].   THE PATIENT IS ENCOURAGED TO PRACTICE SOCIAL DISTANCING DUE TO THE COVID-19 PANDEMIC.

## 2022-01-08 ENCOUNTER — Other Ambulatory Visit: Payer: Self-pay | Admitting: Nurse Practitioner

## 2022-01-08 ENCOUNTER — Other Ambulatory Visit: Payer: Self-pay

## 2022-01-08 ENCOUNTER — Other Ambulatory Visit (INDEPENDENT_AMBULATORY_CARE_PROVIDER_SITE_OTHER): Payer: BC Managed Care – PPO

## 2022-01-08 DIAGNOSIS — R3915 Urgency of urination: Secondary | ICD-10-CM

## 2022-01-08 LAB — MICROALBUMIN / CREATININE URINE RATIO
Creatinine, Urine: 238.5 mg/dL
Microalb/Creat Ratio: 22 mg/g creat (ref 0–29)
Microalbumin, Urine: 53.6 ug/mL

## 2022-01-08 LAB — CMP14+EGFR
ALT: 16 IU/L (ref 0–32)
AST: 16 IU/L (ref 0–40)
Albumin/Globulin Ratio: 1.6 (ref 1.2–2.2)
Albumin: 4.4 g/dL (ref 3.9–4.9)
Alkaline Phosphatase: 110 IU/L (ref 44–121)
BUN/Creatinine Ratio: 11 (ref 9–23)
BUN: 11 mg/dL (ref 6–24)
Bilirubin Total: 0.4 mg/dL (ref 0.0–1.2)
CO2: 23 mmol/L (ref 20–29)
Calcium: 9.8 mg/dL (ref 8.7–10.2)
Chloride: 103 mmol/L (ref 96–106)
Creatinine, Ser: 0.98 mg/dL (ref 0.57–1.00)
Globulin, Total: 2.7 g/dL (ref 1.5–4.5)
Glucose: 117 mg/dL — ABNORMAL HIGH (ref 70–99)
Potassium: 4.2 mmol/L (ref 3.5–5.2)
Sodium: 144 mmol/L (ref 134–144)
Total Protein: 7.1 g/dL (ref 6.0–8.5)
eGFR: 74 mL/min/{1.73_m2} (ref >59)

## 2022-01-08 LAB — CBC
Hematocrit: 39.8 % (ref 34.0–46.6)
Hemoglobin: 13.7 g/dL (ref 11.1–15.9)
MCH: 32.4 pg (ref 26.6–33.0)
MCHC: 34.4 g/dL (ref 31.5–35.7)
MCV: 94 fL (ref 79–97)
Platelets: 351 10*3/uL (ref 150–450)
RBC: 4.23 x10E6/uL (ref 3.77–5.28)
RDW: 14.2 % (ref 11.7–15.4)
WBC: 7.5 10*3/uL (ref 3.4–10.8)

## 2022-01-08 LAB — POCT URINALYSIS DIPSTICK
Bilirubin, UA: NEGATIVE
Glucose, UA: NEGATIVE
Ketones, UA: NEGATIVE
Nitrite, UA: NEGATIVE
Protein, UA: POSITIVE — AB
Spec Grav, UA: >=1.03 — AB (ref 1.010–1.025)
Urobilinogen, UA: 0.2 E.U./dL
pH, UA: 6 (ref 5.0–8.0)

## 2022-01-08 LAB — HIV ANTIBODY (ROUTINE TESTING W REFLEX): HIV Screen 4th Generation wRfx: NONREACTIVE

## 2022-01-08 LAB — HEMOGLOBIN A1C
Est. average glucose Bld gHb Est-mCnc: 131 mg/dL
Hgb A1c MFr Bld: 6.2 % — ABNORMAL HIGH (ref 4.8–5.6)

## 2022-01-08 LAB — RPR: RPR Ser Ql: NONREACTIVE

## 2022-01-08 LAB — HEPATITIS C ANTIBODY: Hep C Virus Ab: NONREACTIVE

## 2022-01-08 LAB — HEPATITIS B SURFACE ANTIGEN: Hepatitis B Surface Ag: NEGATIVE

## 2022-01-08 LAB — VITAMIN B12: Vitamin B-12: 255 pg/mL (ref 232–1245)

## 2022-01-08 LAB — TSH: TSH: 1.28 u[IU]/mL (ref 0.450–4.500)

## 2022-01-08 MED ORDER — NITROFURANTOIN MONOHYD MACRO 100 MG PO CAPS: 100.0000 mg | ORAL_CAPSULE | Freq: Two times a day (BID) | ORAL | 0 refills | Status: DC

## 2022-01-09 ENCOUNTER — Other Ambulatory Visit: Payer: Self-pay

## 2022-01-09 ENCOUNTER — Inpatient Hospital Stay: Payer: BC Managed Care – PPO | Attending: Hematology and Oncology

## 2022-01-09 VITALS — BP 139/90 | HR 87 | Temp 98.2°F | Resp 18

## 2022-01-09 DIAGNOSIS — Z95828 Presence of other vascular implants and grafts: Secondary | ICD-10-CM

## 2022-01-09 DIAGNOSIS — Z5111 Encounter for antineoplastic chemotherapy: Secondary | ICD-10-CM | POA: Insufficient documentation

## 2022-01-09 DIAGNOSIS — C50411 Malignant neoplasm of upper-outer quadrant of right female breast: Secondary | ICD-10-CM | POA: Insufficient documentation

## 2022-01-09 DIAGNOSIS — Z170 Estrogen receptor positive status [ER+]: Secondary | ICD-10-CM

## 2022-01-09 MED ORDER — GOSERELIN ACETATE 3.6 MG ~~LOC~~ IMPL
3.6000 mg | DRUG_IMPLANT | Freq: Once | SUBCUTANEOUS | Status: AC
  Administered 2022-01-09: 3.6 mg via SUBCUTANEOUS
  Filled 2022-01-09: qty 3.6

## 2022-01-11 LAB — URINE CULTURE

## 2022-01-12 LAB — URINE CYTOLOGY ANCILLARY ONLY
Bacterial Vaginitis-Urine: NEGATIVE
Candida Urine: NEGATIVE
Chlamydia: NEGATIVE
Comment: NEGATIVE
Comment: NORMAL
Neisseria Gonorrhea: NEGATIVE

## 2022-01-18 ENCOUNTER — Other Ambulatory Visit: Payer: Self-pay | Admitting: Hematology and Oncology

## 2022-01-19 ENCOUNTER — Encounter: Payer: Self-pay | Admitting: Hematology and Oncology

## 2022-02-02 NOTE — Progress Notes (Unsigned)
Name: Heidi Costa  MRN/ DOB: [id number], 10/03/1980   Age/ Sex: 41 y.o., female    PCP: Glendale Chard, MD   Reason for Endocrinology Evaluation: Type 2 Diabetes Mellitus     Date of Initial Endocrinology Visit: 09/23/2021    PATIENT IDENTIFIER: Heidi Costa is a 41 y.o. female with a past medical history of Hx of breast ca ( S/P lumpectomy 2021) and chemo. The patient presented for initial endocrinology clinic visit on 09/23/2021 for consultative assistance with her diabetes management.    HPI: Heidi Costa was    Diagnosed with DM 02/2021 when she presented to the ED with symptomatic hyperglycemia.  She was noted to be in DKA, with an elevated anion gap at 25 and elevated  beta hydroxybutyrate> mmol/L. She was initially discharged on insulin and Metformin .  Prior Medications tried/Intolerance: Metformin, Janumet Hemoglobin A1c has ranged from 6.1% in 2023, peaking at 10.5% in 2022.  On her initial visit to our clinic she had an A1c of 6.1%, we continued [place] and Ozempic     SUBJECTIVE:   During the last visit (09/23/2021): A1c 6.1%  Today (02/03/22): Heidi Costa is here for follow-up on diabetes management.  She checks her blood sugars 1 times daily. The patient has not had hypoglycemic episodes since the last clinic visit.   Weight has been stable  Denies nausea, vomiting or diarrhea  Her Ozempic was increased by PCP to 1 mg weekly  She is   HOME DIABETES REGIMEN: Farxiga 10 mg Ozempic 1 mg weekly   Statin: Yes ACE-I/ARB: no Prior Diabetic Education: yes 2023   METER DOWNLOAD SUMMARY: unable to download 14 day average 131 mg/dL   Range 78- 172 mg/dL    DIABETIC COMPLICATIONS: Microvascular complications:   Denies: CKD Last eye exam: Completed 03/2021  Macrovascular complications:   Denies: CAD, PVD, CVA   PAST HISTORY: Past Medical History:  Past Medical History:  Diagnosis Date   Abnormality of gait 08/02/2012   resolved per patient  on 07/12/19   Anemia    iron taking in the past, not currently   Anxiety    Arthritis    spine- cerv.  & arm    Asthma    Bulging lumbar disc    Cancer ([place])    Right Breast-starts chemo on 07/14/19   Chronic arthralgias of knees and hips 08/02/2012   Depression    Pt. took self off Celexa one month ago, states she did n't like how it made her feel .   Family history of cancer    GERD (gastroesophageal reflux disease)    diet controlled   Heart murmur    pt. been told that this is a fact, no echo or cardiac surveillance in the past    HLD (hyperlipidemia)    no meds, diet controlled   Hypertension    MRSA (methicillin resistant Staphylococcus aureus) 03/02/2005   Obesity    Spinal stenosis    Past Surgical History:  Past Surgical History:  Procedure Laterality Date   ANTERIOR CERVICAL DECOMP/DISCECTOMY FUSION N/A 10/27/2012   Procedure: ANTERIOR CERVICAL DECOMPRESSION/DISCECTOMY FUSION 1 LEVEL;  Surgeon: Faythe Ghee, MD;  Location: [facility];  Service: Neurosurgery;  Laterality: N/A;  ANTERIOR CERVICAL DECOMPRESSION/DISCECTOMY FUSION 1 LEVEL   BREAST LUMPECTOMY WITH RADIOACTIVE SEED AND SENTINEL LYMPH NODE BIOPSY Right 12/13/2019   Procedure: RIGHT BREAST LUMPECTOMY WITH RADIOACTIVE SEED AND RIGHT AXILLARY  SENTINEL LYMPH NODE BIOPSY, RIGHT AXILLARY NODE SEED GUIDED EXCISION, BLUE  DYE INJECTION;  Surgeon: Rolm Bookbinder, MD;  Location: [facility];  Service: General;  Laterality: Right;  PEC BLOCK   BREAST REDUCTION SURGERY Bilateral 03/04/2021   Procedure: MAMMARY REDUCTION  (BREAST);  Surgeon: Contogiannis, Audrea Muscat, MD;  Location: [facility];  Service: Plastics;  Laterality: Bilateral;   CHALAZION EXCISION     L eye    PORTACATH PLACEMENT Left 07/13/2019   Procedure: INSERTION PORT-A-CATH WITH ULTRASOUND;  Surgeon: Rolm Bookbinder, MD;  Location: [facility];  Service: General;  Laterality: Left;   VAGINAL DELIVERY  2002   WISDOM TOOTH  EXTRACTION      Social History:  reports that she quit smoking about 19 months ago. Her smoking use included cigarettes. She has a 10.00 pack-year smoking history. She has never used smokeless tobacco. She reports current alcohol use. She reports that she does not currently use drugs after having used the following drugs: Marijuana. Family History:  Family History  Problem Relation Age of Onset   Osteoarthritis Mother    Hypertension Mother    Diabetes Mother    Hypertension Father    Gout Father    Stroke Father 72       embolic   Diabetes Half-Brother    Hypertension Half-Brother    Heart failure Half-Brother 70   Kidney disease Half-Brother    Diabetes Paternal Grandfather    Cancer Paternal Grandfather 13       breast cancer     HOME MEDICATIONS: Allergies as of 02/03/2022       Reactions   Aspirin Rash        Medication List        Accurate as of February 03, 2022 11:27 AM. If you have any questions, ask your nurse or doctor.          STOP taking these medications    FreeStyle Libre 2 Sensor Misc Stopped by: Dorita Sciara, MD       TAKE these medications    Accu-Chek Guide w/Device Kit Use to check blood sugar 2 times daily   acetaminophen 500 MG tablet Commonly known as: TYLENOL Take 500-1,000 mg by mouth daily as needed for pain.   albuterol 108 (90 Base) MCG/ACT inhaler Commonly known as: VENTOLIN HFA Inhale 2 puffs into the lungs every 6 (six) hours as needed for wheezing or shortness of breath.   atorvastatin 10 MG tablet Commonly known as: Lipitor Take 1 tablet by mouth daily   baclofen 10 MG tablet Commonly known as: LIORESAL TAKE 1 TABLET BY MOUTH THREE TIMES DAILY   blood glucose meter kit and supplies Kit Dispense based on patient and insurance preference. Use up to four times daily as directed. (FOR ICD-137). For QAC - HS accuchecks.  Dispense any approved glucometer with 1 month testing supplies.   buPROPion 150 MG 24 hr  tablet Commonly known as: WELLBUTRIN XL TAKE 1 TABLET BY MOUTH ONCE DAILY IN THE MORNING   carvedilol 6.25 MG tablet Commonly known as: COREG TAKE 1 TABLET BY MOUTH TWICE DAILY WITH A MEAL   dapagliflozin propanediol 10 MG Tabs tablet Commonly known as: Farxiga Take 1 tablet (10 mg total) by mouth daily.   glucose blood test strip Use as instructed   letrozole 2.5 MG tablet Commonly known as: FEMARA Take 1 tablet (2.5 mg total) by mouth daily.   Semaglutide (1 MG/DOSE) 4 MG/3ML Sopn Inject 1 mg into the skin once a week.  ALLERGIES: Allergies  Allergen Reactions   Aspirin Rash     REVIEW OF SYSTEMS: A comprehensive ROS was conducted with the patient and is negative except as per HPI    OBJECTIVE:   VITAL SIGNS: BP 124/80 (BP Location: Left Arm, Patient Position: Sitting, Cuff Size: Large)   Pulse 60   Ht _0  (1.651 m)   Wt 227 lb (103 kg)   LMP  (LMP Unknown) Comment: June 2022  SpO2 99%   BMI 37.77 kg/m    PHYSICAL EXAM:  General: Pt appears well and is in NAD  Neck: General: Supple without adenopathy or carotid bruits. Thyroid: Thyroid size normal.  No goiter or nodules appreciated.  Lungs: Clear with good BS bilat with no rales, rhonchi, or wheezes  Heart: RRR with normal S1 and S2 and no gallops; no murmurs; no rub  Abdomen: Normoactive bowel sounds, soft, nontender, without masses or organomegaly palpable  Extremities:  Lower extremities - No pretibial edema. No lesions.  Neuro: MS is good with appropriate affect, pt is alert and Ox3    DM foot exam: 09/23/2021  The skin of the feet is intact without sores or ulcerations. The pedal pulses are 2+ on right and 2+ on left. The sensation is intact to a screening 5.07, 10 gram monofilament bilaterally   DATA REVIEWED:  Lab Results  Component Value Date   HGBA1C 6.2 (H) 01/07/2022   HGBA1C 6.1 (A) 09/23/2021   HGBA1C 7.3 (H) 07/03/2021   Lab Results  Component Value Date    MICROALBUR 30 03/27/2021   LDLCALC 91 08/19/2021   CREATININE 0.98 01/07/2022   Lab Results  Component Value Date   MICRALBCREAT 22 01/07/2022    Lab Results  Component Value Date   CHOL 163 08/19/2021   HDL 37 (L) 08/19/2021   LDLCALC 91 08/19/2021   TRIG 205 (H) 08/19/2021   CHOLHDL 4.4 08/19/2021        Latest Reference Range & Units 08/19/21 16:44  Sodium 134 - 144 mmol/L 142  Potassium 3.5 - 5.2 mmol/L 4.5  Chloride 96 - 106 mmol/L 102  CO2 20 - 29 mmol/L 25  Glucose 70 - 99 mg/dL 106 (H)  BUN 6 - 24 mg/dL 9  Creatinine 0.57 - 1.00 mg/dL 0.98  Calcium 8.7 - 10.2 mg/dL 9.9  BUN/Creatinine Ratio 9 - 23  9  eGFR >59 mL/min/1.73 74  ALT 0 - 32 IU/L 13    Latest Reference Range & Units 08/19/21 16:44  Total CHOL/HDL Ratio 0.0 - 4.4 ratio 4.4  Cholesterol, Total 100 - 199 mg/dL 163  HDL Cholesterol >39 mg/dL 37 (L)  Triglycerides 0 - 149 mg/dL 205 (H)  VLDL Cholesterol Cal 5 - 40 mg/dL 35  LDL Chol Calc (NIH) 0 - 99 mg/dL 91      ASSESSMENT / PLAN / RECOMMENDATIONS:   1) Type 2 Diabetes Mellitus, Optimally controlled, Without complications - Most recent A1c of 6.2 %. Goal A1c < 7.0 %.    - A1c remains at goal  - We again discussed the importance of lifestyle changes in weight loss and avoiding sugar -sweetened foods  - Her fasting BG this am was high at 172 mg/dL , she had cinnamon roll last night 4 - PCP increased her Ozempic to 1 mg - she tends to hold farxiga if BG is normal, I have advised her again this practice and needs to take it regularly , so we can determine the need for it or not  in the future   MEDICATIONS: Continue Farxiga 10 mg daily Continue Ozempic 1 mg weekly  EDUCATION / INSTRUCTIONS: BG monitoring instructions: Patient is instructed to check her blood sugars 1 times a day, fasting. Call [facility] if: BG persistently < 70  I reviewed the Rule of 15 for the treatment of hypoglycemia in detail with the patient. Literature  supplied.   2) Diabetic complications:  Eye: Does not have known diabetic retinopathy.  Neuro/ Feet: Does not have known diabetic peripheral neuropathy. Renal: Patient does not have known baseline CKD. She is not on an ACEI/ARB at present.     Follow-up in 6 months   Signed electronically by: Mack Guise, MD  [facility]  [facility] [address] [place], [place] [address] Phone: [phone] FAX: [phone]   CC: Glendale Chard, [address] [place] [address] Phone: [phone]  Fax: [phone]    Return to Endocrinology clinic as below: Future Appointments  Date Time Provider [place]  02/03/2022 11:30 AM Wile[place]ta Bermingham, Melanie Crazier, MD [facility] None  02/09/2022  3:45 PM [facility] [place] [facility] None  02/12/2022  4:00 PM [facility] None  02/25/2022  3:20 PM Glendale Chard, MD [facility] None  03/12/2022  3:00 PM Nicholas Lose, MD [facility] None  03/12/2022  3:45 PM [facility] [place] [facility] None

## 2022-02-03 ENCOUNTER — Encounter: Payer: Self-pay | Admitting: Internal Medicine

## 2022-02-03 ENCOUNTER — Ambulatory Visit (INDEPENDENT_AMBULATORY_CARE_PROVIDER_SITE_OTHER): Payer: BC Managed Care – PPO | Admitting: Internal Medicine

## 2022-02-03 VITALS — BP 124/80 | HR 60 | Ht 65.0 in | Wt 227.0 lb

## 2022-02-03 DIAGNOSIS — E119 Type 2 diabetes mellitus without complications: Secondary | ICD-10-CM | POA: Diagnosis not present

## 2022-02-03 NOTE — Patient Instructions (Signed)
Continue Farxiga 10 mg daily  Continue Ozempic 1 mg weekly      HOW TO TREAT LOW BLOOD SUGARS (Blood sugar LESS THAN 70 MG/DL) Please follow the RULE OF 15 for the treatment of hypoglycemia treatment (when your (blood sugars are less than 70 mg/dL)   STEP 1: Take 15 grams of carbohydrates when your blood sugar is low, which includes:  3-4 GLUCOSE TABS  OR 3-4 OZ OF JUICE OR REGULAR SODA OR ONE TUBE OF GLUCOSE GEL    STEP 2: RECHECK blood sugar in 15 MINUTES STEP 3: If your blood sugar is still low at the 15 minute recheck --> then, go back to STEP 1 and treat AGAIN with another 15 grams of carbohydrates.

## 2022-02-06 ENCOUNTER — Other Ambulatory Visit: Payer: Self-pay | Admitting: Internal Medicine

## 2022-02-09 ENCOUNTER — Other Ambulatory Visit: Payer: Self-pay

## 2022-02-09 ENCOUNTER — Inpatient Hospital Stay: Payer: BC Managed Care – PPO | Attending: Hematology and Oncology

## 2022-02-09 VITALS — BP 120/84 | HR 64 | Temp 98.7°F | Resp 20

## 2022-02-09 DIAGNOSIS — Z5111 Encounter for antineoplastic chemotherapy: Secondary | ICD-10-CM | POA: Diagnosis not present

## 2022-02-09 DIAGNOSIS — C50411 Malignant neoplasm of upper-outer quadrant of right female breast: Secondary | ICD-10-CM | POA: Insufficient documentation

## 2022-02-09 DIAGNOSIS — Z170 Estrogen receptor positive status [ER+]: Secondary | ICD-10-CM

## 2022-02-09 DIAGNOSIS — Z95828 Presence of other vascular implants and grafts: Secondary | ICD-10-CM

## 2022-02-09 MED ORDER — GOSERELIN ACETATE 3.6 MG ~~LOC~~ IMPL
3.6000 mg | DRUG_IMPLANT | Freq: Once | SUBCUTANEOUS | Status: AC
  Administered 2022-02-09: 3.6 mg via SUBCUTANEOUS
  Filled 2022-02-09: qty 3.6

## 2022-02-12 ENCOUNTER — Ambulatory Visit (INDEPENDENT_AMBULATORY_CARE_PROVIDER_SITE_OTHER): Payer: BC Managed Care – PPO

## 2022-02-12 VITALS — BP 136/68 | HR 78 | Temp 98.4°F | Ht 65.0 in | Wt 227.0 lb

## 2022-02-12 DIAGNOSIS — Z862 Personal history of diseases of the blood and blood-forming organs and certain disorders involving the immune mechanism: Secondary | ICD-10-CM

## 2022-02-12 MED ORDER — CYANOCOBALAMIN 1000 MCG/ML IJ SOLN
1000.0000 ug | Freq: Once | INTRAMUSCULAR | Status: AC
  Administered 2022-02-12: 1000 ug via INTRAMUSCULAR

## 2022-02-12 NOTE — Progress Notes (Signed)
Patient presents today for 2nd b12 shot.

## 2022-02-25 ENCOUNTER — Encounter: Payer: Self-pay | Admitting: Internal Medicine

## 2022-02-25 ENCOUNTER — Ambulatory Visit (INDEPENDENT_AMBULATORY_CARE_PROVIDER_SITE_OTHER): Payer: BC Managed Care – PPO | Admitting: Internal Medicine

## 2022-02-25 VITALS — BP 138/80 | HR 75 | Temp 98.3°F | Ht 65.0 in | Wt 224.2 lb

## 2022-02-25 DIAGNOSIS — E1169 Type 2 diabetes mellitus with other specified complication: Secondary | ICD-10-CM | POA: Diagnosis not present

## 2022-02-25 DIAGNOSIS — Z794 Long term (current) use of insulin: Secondary | ICD-10-CM

## 2022-02-25 DIAGNOSIS — Z6837 Body mass index (BMI) 37.0-37.9, adult: Secondary | ICD-10-CM

## 2022-02-25 DIAGNOSIS — E559 Vitamin D deficiency, unspecified: Secondary | ICD-10-CM

## 2022-02-25 DIAGNOSIS — Z0000 Encounter for general adult medical examination without abnormal findings: Secondary | ICD-10-CM | POA: Diagnosis not present

## 2022-02-25 DIAGNOSIS — E782 Mixed hyperlipidemia: Secondary | ICD-10-CM

## 2022-02-25 DIAGNOSIS — I10 Essential (primary) hypertension: Secondary | ICD-10-CM

## 2022-02-25 DIAGNOSIS — E785 Hyperlipidemia, unspecified: Secondary | ICD-10-CM

## 2022-02-25 DIAGNOSIS — M545 Low back pain, unspecified: Secondary | ICD-10-CM | POA: Diagnosis not present

## 2022-02-25 DIAGNOSIS — E7800 Pure hypercholesterolemia, unspecified: Secondary | ICD-10-CM

## 2022-02-25 LAB — POCT URINALYSIS DIPSTICK
Bilirubin, UA: NEGATIVE
Blood, UA: NEGATIVE
Glucose, UA: POSITIVE — AB
Ketones, UA: NEGATIVE
Leukocytes, UA: NEGATIVE
Nitrite, UA: NEGATIVE
Protein, UA: NEGATIVE
Spec Grav, UA: >=1.03 — AB (ref 1.010–1.025)
Urobilinogen, UA: 0.2 E.U./dL
pH, UA: 5.5 (ref 5.0–8.0)

## 2022-02-25 MED ORDER — CYCLOBENZAPRINE HCL 5 MG PO TABS: 5.0000 mg | ORAL_TABLET | Freq: Three times a day (TID) | ORAL | 0 refills | Status: DC | PRN

## 2022-02-25 NOTE — Progress Notes (Signed)
Barnet Glasgow Martin,acting as a Education administrator for [name], MD.,have documented all relevant documentation on the behalf of [name], MD,as directed by  [name], MD while in the presence of [name], MD.   Subjective:     Patient ID: Heidi Costa , female    DOB: Oct 06, 1980 , 41 y.o.   MRN: [id number]   Chief Complaint  Patient presents with   Annual Exam   Diabetes   Hypertension    HPI  Patient presents today for HM, she is followed by GYN for her pelvic exams.  She reports compliance with medications and has no concerns at this time. She denies having any headaches, chest pain, shortness of breath.   BP Readings from Last 3 Encounters: 02/25/22 : 138/80 02/12/22 : 136/68 02/09/22 : 120/84    Diabetes She presents for her follow-up diabetic visit. She has type 2 diabetes mellitus. Pertinent negatives for hypoglycemia include no dizziness or headaches. Pertinent negatives for diabetes include no chest pain, no fatigue, no polydipsia, no polyphagia, no polyuria and no weakness. There are no hypoglycemic complications.  Hypertension This is a chronic problem. The problem has been gradually improving since onset. The problem is controlled. Pertinent negatives include no chest pain or headaches. Risk factors for coronary artery disease include diabetes mellitus, dyslipidemia and obesity. Past treatments include beta blockers. The current treatment provides moderate improvement. Compliance problems include exercise.      Past Medical History:  Diagnosis Date   Abnormality of gait 08/02/2012   resolved per patient on 07/12/19   Anemia    iron taking in the past, not currently   Anxiety    Arthritis    spine- cerv.  & arm    Asthma    Bulging lumbar disc    Cancer ([place])    Right Breast-starts chemo on 07/14/19   Chronic arthralgias of knees and hips 08/02/2012   Depression    Pt. took self off Celexa one month ago, states she did n't like how it made her  feel .   Family history of cancer    GERD (gastroesophageal reflux disease)    diet controlled   Heart murmur    pt. been told that this is a fact, no echo or cardiac surveillance in the past    HLD (hyperlipidemia)    no meds, diet controlled   Hypertension    MRSA (methicillin resistant Staphylococcus aureus) 03/02/2005   Obesity    Spinal stenosis      Family History  Problem Relation Age of Onset   Osteoarthritis Mother    Hypertension Mother    Diabetes Mother    Hypertension Father    Gout Father    Stroke Father 22       embolic   Diabetes Half-Brother    Hypertension Half-Brother    Heart failure Half-Brother 57   Kidney disease Half-Brother    Diabetes Paternal Grandfather    Cancer Paternal Grandfather 51       breast cancer     Current Outpatient Medications:    acetaminophen (TYLENOL) 500 MG tablet, Take 500-1,000 mg by mouth daily as needed for pain., Disp: , Rfl:    albuterol (VENTOLIN HFA) 108 (90 Base) MCG/ACT inhaler, Inhale 2 puffs into the lungs every 6 (six) hours as needed for wheezing or shortness of breath., Disp: 18 g, Rfl: 2   atorvastatin (LIPITOR) 10 MG tablet, Take 1 tablet by mouth daily, Disp: 30 tablet, Rfl:  2   blood glucose meter kit and supplies KIT, Dispense based on patient and insurance preference. Use up to four times daily as directed. (FOR ICD-137). For QAC - HS accuchecks.  Dispense any approved glucometer with 1 month testing supplies., Disp: 1 each, Rfl: 1   Blood Glucose Monitoring Suppl (ACCU-CHEK GUIDE) w/Device KIT, Use to check blood sugar 2 times daily, Disp: 1 kit, Rfl: 0   buPROPion (WELLBUTRIN XL) 150 MG 24 hr tablet, TAKE 1 TABLET BY MOUTH ONCE DAILY IN THE MORNING, Disp: 90 tablet, Rfl: 0   carvedilol (COREG) 6.25 MG tablet, TAKE 1 TABLET BY MOUTH TWICE DAILY WITH A MEAL, Disp: 180 tablet, Rfl: 0   cyclobenzaprine (FLEXERIL) 5 MG tablet, Take 1 tablet (5 mg total) by mouth 3 (three) times daily as needed for muscle  spasms., Disp: 30 tablet, Rfl: 0   glucose blood test strip, Use as instructed, Disp: 100 each, Rfl: 12   letrozole (FEMARA) 2.5 MG tablet, Take 1 tablet (2.5 mg total) by mouth daily., Disp: 90 tablet, Rfl: 3   OZEMPIC, 1 MG/DOSE, 4 MG/3ML SOPN, INJECT 1MG INTO THE SKIN ONCE A WEEK, Disp: 3 mL, Rfl: 0   baclofen (LIORESAL) 10 MG tablet, TAKE 1 TABLET BY MOUTH THREE TIMES DAILY, Disp: 90 tablet, Rfl: 0   Vitamin D, Ergocalciferol, (DRISDOL) 1.25 MG (50000 UNIT) CAPS capsule, Take 1 capsule (50,000 Units total) by mouth every 7 (seven) days., Disp: 12 capsule, Rfl: 0   Allergies  Allergen Reactions   Aspirin Rash      The patient states she uses none for birth control. Last LMP was No LMP recorded (lmp unknown). Patient is postmenopausal.. Negative for Dysmenorrhea. Negative for: breast discharge, breast lump(s), breast pain and breast self exam. Associated symptoms include abnormal vaginal bleeding. Pertinent negatives include abnormal bleeding (hematology), anxiety, decreased libido, depression, difficulty falling sleep, dyspareunia, history of infertility, nocturia, sexual dysfunction, sleep disturbances, urinary incontinence, urinary urgency, vaginal discharge and vaginal itching. Diet regular.The patient states her exercise level is  intermittent.  . The patient's tobacco use is:  Social History   Tobacco Use  Smoking Status Former   Packs/day: 1.00   Years: 10.00   Total pack years: 10.00   Types: Cigarettes   Quit date: 06/24/2020   Years since quitting: 1.7  Smokeless Tobacco Never  . She has been exposed to passive smoke. The patient's alcohol use is:  Social History   Substance and Sexual Activity  Alcohol Use Yes   Comment: occas    Review of Systems  Constitutional: Negative.  Negative for fatigue.  HENT: Negative.    Eyes: Negative.   Respiratory: Negative.    Cardiovascular: Negative.  Negative for chest pain.  Gastrointestinal: Negative.   Endocrine: Negative.   Negative for polydipsia, polyphagia and polyuria.  Genitourinary: Negative.   Musculoskeletal:  Positive for back pain.       She c/o lbp, denies fall/trauma. Denies urinary/fecal incontinence.   Skin: Negative.   Allergic/Immunologic: Negative.   Neurological: Negative.  Negative for dizziness, weakness and headaches.  Hematological: Negative.   Psychiatric/Behavioral: Negative.       Today's Vitals   02/25/22 1520  BP: 138/80  Pulse: 75  Temp: 98.3 F (36.8 C)  TempSrc: Oral  Weight: 224 lb 3.2 oz (101.7 kg)  Height: _0  (1.651 m)  PainSc: 0-No pain   Body mass index is 37.31 kg/m.  Wt Readings from Last 3 Encounters:  02/25/22 224 lb 3.2 oz (101.7 kg)  02/12/22 227 lb (103 kg)  02/03/22 227 lb (103 kg)    Objective:  Physical Exam Vitals and nursing note reviewed.  Constitutional:      Appearance: Normal appearance. She is obese.  HENT:     Head: Normocephalic and atraumatic.     Right Ear: Tympanic membrane, ear canal and external ear normal.     Left Ear: Tympanic membrane, ear canal and external ear normal.     Nose:     Comments: Masked     Mouth/Throat:     Comments: Masked Eyes:     Extraocular Movements: Extraocular movements intact.     Conjunctiva/sclera: Conjunctivae normal.     Pupils: Pupils are equal, round, and reactive to light.  Cardiovascular:     Rate and Rhythm: Normal rate and regular rhythm.     Pulses: Normal pulses.          Dorsalis pedis pulses are 2+ on the right side and 2+ on the left side.     Heart sounds: Normal heart sounds.  Pulmonary:     Effort: Pulmonary effort is normal.     Breath sounds: Normal breath sounds.  Chest:  Breasts:    Tanner Score is 5.     Comments: She kept bra on Abdominal:     General: Bowel sounds are normal.     Palpations: Abdomen is soft.     Comments: Rounded, soft  Genitourinary:    Comments: deferred Musculoskeletal:        General: Normal range of motion.     Cervical back: Normal  range of motion and neck supple.  Feet:     Right foot:     Protective Sensation: 5 sites tested.  5 sites sensed.     Skin integrity: Dry skin present.     Toenail Condition: Right toenails are normal.     Left foot:     Protective Sensation: 5 sites tested.  5 sites sensed.     Skin integrity: Dry skin present.     Toenail Condition: Left toenails are normal.  Skin:    General: Skin is warm and dry.  Neurological:     General: No focal deficit present.     Mental Status: She is alert and oriented to person, place, and time.  Psychiatric:        Mood and Affect: Mood normal.        Behavior: Behavior normal.       Assessment And Plan:     1. Routine general medical examination at health care facility Comments: A full exam was performed. Importance of monthly self breast exams was discussed with the patient.  PATIENT IS ADVISED TO GET 30-45 MINUTES REGULAR EXERCISE NO LESS THAN FOUR TO FIVE DAYS PER WEEK - BOTH WEIGHTBEARING EXERCISES AND AEROBIC ARE RECOMMENDED.  PATIENT IS ADVISED TO FOLLOW A HEALTHY DIET WITH AT LEAST SIX FRUITS/VEGGIES PER DAY, DECREASE INTAKE OF RED MEAT, AND TO INCREASE FISH INTAKE TO TWO DAYS PER WEEK.  MEATS/FISH SHOULD NOT BE FRIED, BAKED OR BROILED IS PREFERABLE.  IT IS ALSO IMPORTANT TO CUT BACK ON YOUR SUGAR INTAKE. PLEASE AVOID ANYTHING WITH ADDED SUGAR, CORN SYRUP OR OTHER SWEETENERS. IF YOU MUST USE A SWEETENER, YOU CAN TRY STEVIA. IT IS ALSO IMPORTANT TO AVOID ARTIFICIALLY SWEETENERS AND DIET BEVERAGES. LASTLY, I SUGGEST WEARING SPF 50 SUNSCREEN ON EXPOSED PARTS AND ESPECIALLY WHEN IN THE DIRECT SUNLIGHT FOR AN EXTENDED PERIOD OF TIME.  PLEASE AVOID FAST FOOD RESTAURANTS  AND INCREASE YOUR WATER INTAKE.  2. Dyslipidemia associated with type 2 diabetes mellitus ([place]) Comments: Chronic, diabetic foot exam was performed. She is also followed by Endocrinology. Importance of dietary compliance was d/w patient. LDL Goal <70.  I DISCUSSED WITH THE PATIENT AT LENGTH  REGARDING THE GOALS OF GLYCEMIC CONTROL AND POSSIBLE LONG-TERM COMPLICATIONS.  I  ALSO STRESSED THE IMPORTANCE OF COMPLIANCE WITH HOME GLUCOSE MONITORING, DIETARY RESTRICTIONS INCLUDING AVOIDANCE OF SUGARY DRINKS/PROCESSED FOODS,  ALONG WITH REGULAR EXERCISE.  I  ALSO STRESSED THE IMPORTANCE OF ANNUAL EYE EXAMS, SELF FOOT CARE AND COMPLIANCE WITH OFFICE VISITS.  - Microalbumin / creatinine urine ratio - POCT urinalysis dipstick - Lipid panel - Hemoglobin A1c  3. Essential hypertension, benign Comments: Chronic, fair control. EKG performed, NSR w/o acute changes. She is encouraged to follow low sodium diet. She will c/w carvedilol for now. She has been on lisinopril in the past, she is no sure why she stopped taking the medication. I will review renal function today.  - EKG 12-Lead  4. Acute bilateral low back pain without sciatica Comments: She was given rx cyclobenzaprine to take nightly prn. She is also advised to perform stretching exercises daily. She will let me know if sx persist.  5. Vitamin D deficiency disease Comments: I will check vitamin D level and supplement as needed. - Vitamin D (25 hydroxy)  6. Class 2 severe obesity due to excess calories with serious comorbidity and body mass index (BMI) of 37.0 to 37.9 in adult [facility]) Comments: She is encouraged to incorporate at least 150 minutes of exercise per week, while striving for BMI<30 to decrease cardiac risk.  Patient was given opportunity to ask questions. Patient verbalized understanding of the plan and was able to repeat key elements of the plan. All questions were answered to their satisfaction.   I, [name], MD, have reviewed all documentation for this visit. The documentation on 02/25/22 for the exam, diagnosis, procedures, and orders are all accurate and complete.  THE PATIENT IS ENCOURAGED TO PRACTICE SOCIAL DISTANCING DUE TO THE COVID-19 PANDEMIC.

## 2022-02-25 NOTE — Patient Instructions (Signed)

## 2022-02-27 LAB — LIPID PANEL
Chol/HDL Ratio: 4.8 ratio — ABNORMAL HIGH (ref 0.0–4.4)
Cholesterol, Total: 177 mg/dL (ref 100–199)
HDL: 37 mg/dL — ABNORMAL LOW (ref >39)
LDL Chol Calc (NIH): 113 mg/dL — ABNORMAL HIGH (ref 0–99)
Triglycerides: 152 mg/dL — ABNORMAL HIGH (ref 0–149)
VLDL Cholesterol Cal: 27 mg/dL (ref 5–40)

## 2022-02-27 LAB — VITAMIN D 25 HYDROXY (VIT D DEFICIENCY, FRACTURES): Vit D, 25-Hydroxy: 19 ng/mL — ABNORMAL LOW (ref 30.0–100.0)

## 2022-02-27 LAB — MICROALBUMIN / CREATININE URINE RATIO
Creatinine, Urine: 228.5 mg/dL
Microalb/Creat Ratio: 3 mg/g creat (ref 0–29)
Microalbumin, Urine: 6.8 ug/mL

## 2022-02-27 LAB — HEMOGLOBIN A1C
Est. average glucose Bld gHb Est-mCnc: 126 mg/dL
Hgb A1c MFr Bld: 6 % — ABNORMAL HIGH (ref 4.8–5.6)

## 2022-03-01 ENCOUNTER — Other Ambulatory Visit: Payer: Self-pay | Admitting: Internal Medicine

## 2022-03-02 ENCOUNTER — Other Ambulatory Visit: Payer: Self-pay | Admitting: Hematology and Oncology

## 2022-03-04 ENCOUNTER — Other Ambulatory Visit: Payer: Self-pay

## 2022-03-04 DIAGNOSIS — E559 Vitamin D deficiency, unspecified: Secondary | ICD-10-CM

## 2022-03-04 MED ORDER — VITAMIN D (ERGOCALCIFEROL) 1.25 MG (50000 UNIT) PO CAPS: 50000.0000 [IU] | ORAL_CAPSULE | ORAL | 0 refills | Status: DC

## 2022-03-08 DIAGNOSIS — E559 Vitamin D deficiency, unspecified: Secondary | ICD-10-CM | POA: Insufficient documentation

## 2022-03-08 DIAGNOSIS — E782 Mixed hyperlipidemia: Secondary | ICD-10-CM | POA: Insufficient documentation

## 2022-03-08 DIAGNOSIS — M545 Low back pain, unspecified: Secondary | ICD-10-CM | POA: Insufficient documentation

## 2022-03-08 DIAGNOSIS — E0810 Diabetes mellitus due to underlying condition with ketoacidosis without coma: Secondary | ICD-10-CM | POA: Insufficient documentation

## 2022-03-11 NOTE — Progress Notes (Signed)
Patient Care Team: Dorothyann Peng, MD as PCP - General (Internal Medicine) Donnelly Angelica, RN as Oncology Nurse Navigator Pershing Proud, RN as Oncology Nurse Navigator Emelia Loron, MD as Consulting Physician (General Surgery) Serena Croissant, MD as Consulting Physician (Hematology and Oncology) Dorothy Puffer, MD as Consulting Physician (Radiation Oncology)  DIAGNOSIS:  Encounter Diagnosis  Name Primary?   Malignant neoplasm of upper-outer quadrant of right breast in female, estrogen receptor positive (HCC) Yes    SUMMARY OF ONCOLOGIC HISTORY: Oncology History  Malignant neoplasm of upper-outer quadrant of right breast in female, estrogen receptor positive (HCC)  06/21/2019 Initial Diagnosis   Patient palpated a painful right breast lump x3 weeks. Mammogram showed a 2.0cm right breast mass at the 10 o'clock position, a 3.6cm enlarged right axillary lymph node, and a 0.5cm left breast mass. Biopsy showed, in the right breast and axilla, IDC, grade 3, HER-2 equivocal by IHC, + by FISH, ER+ 40%, PR - 0%, Ki67 80%, and in the left breast, fibrocystic changes, no malignancy.    07/04/2019 Cancer Staging   Staging form: Breast, AJCC 8th Edition - Clinical stage from 07/04/2019: Stage IIB (cT2, cN1(f), cM0, G3, ER+, PR-, HER2+) - Signed by Serena Croissant, MD on 07/05/2019   07/14/2019 - 10/07/2019 Chemotherapy   The patient had dexamethasone (DECADRON) 4 MG tablet, 4 mg (100 % of original dose 4 mg), Oral, Daily, 1 of 1 cycle, Start date: 07/05/2019, End date: 08/04/2019 Dose modification: 4 mg (original dose 4 mg, Cycle 0) palonosetron (ALOXI) injection 0.25 mg, 0.25 mg, Intravenous,  Once, 5 of 5 cycles Administration: 0.25 mg (07/14/2019), 0.25 mg (08/04/2019), 0.25 mg (10/05/2019), 0.25 mg (08/25/2019), 0.25 mg (09/14/2019) pegfilgrastim-jmdb (FULPHILA) injection 6 mg, 6 mg, Subcutaneous,  Once, 5 of 5 cycles Administration: 6 mg (07/17/2019), 6 mg (08/07/2019), 6 mg (10/07/2019), 6 mg (08/29/2019), 6 mg  (09/16/2019) CARBOplatin (PARAPLATIN) 700 mg in sodium chloride 0.9 % 250 mL chemo infusion, 700 mg (100 % of original dose 700 mg), Intravenous,  Once, 5 of 5 cycles Dose modification: 700 mg (original dose 700 mg, Cycle 1, Reason: Provider Judgment), 600 mg (original dose 700 mg, Cycle 2, Reason: Dose not tolerated) Administration: 700 mg (07/14/2019), 600 mg (08/04/2019), 600 mg (10/05/2019), 600 mg (08/25/2019), 600 mg (09/14/2019) DOCEtaxel (TAXOTERE) 170 mg in sodium chloride 0.9 % 250 mL chemo infusion, 75 mg/m2 = 170 mg, Intravenous,  Once, 5 of 5 cycles Dose modification: 65 mg/m2 (original dose 75 mg/m2, Cycle 2, Reason: Dose not tolerated) Administration: 170 mg (07/14/2019), 150 mg (08/04/2019), 150 mg (10/05/2019), 150 mg (08/25/2019), 150 mg (09/14/2019) fosaprepitant (EMEND) 150 mg in sodium chloride 0.9 % 145 mL IVPB, 150 mg, Intravenous,  Once, 5 of 5 cycles Administration: 150 mg (07/14/2019), 150 mg (08/04/2019), 150 mg (10/05/2019), 150 mg (08/25/2019), 150 mg (09/14/2019) pertuzumab (PERJETA) 840 mg in sodium chloride 0.9 % 250 mL chemo infusion, 840 mg, Intravenous, Once, 5 of 5 cycles Administration: 840 mg (07/14/2019), 420 mg (08/04/2019), 420 mg (10/05/2019), 420 mg (08/25/2019), 420 mg (09/14/2019) trastuzumab-dkst (OGIVRI) 900 mg in sodium chloride 0.9 % 250 mL chemo infusion, 903 mg, Intravenous,  Once, 5 of 5 cycles Administration: 900 mg (07/14/2019), 672 mg (08/04/2019), 672 mg (10/05/2019), 672 mg (08/25/2019), 672 mg (09/14/2019)  for chemotherapy treatment.    07/22/2019 Genetic Testing   Negative genetic testing:  No pathogenic variants detected on the [employer] Common Hereditary Cancers Panel. The report date is 07/22/2019.  The Common Hereditary Cancers Panel offered by [facility] includes  sequencing and/or deletion duplication testing of the following 48 genes: APC, ATM, AXIN2, BARD1, BMPR1A, BRCA1, BRCA2, BRIP1, CDH1, CDK4, CDKN2A (p14ARF), CDKN2A (p16INK4a), CHEK2, CTNNA1, DICER1, EPCAM  (Deletion/duplication testing only), GREM1 (promoter region deletion/duplication testing only), KIT, MEN1, MLH1, MSH2, MSH3, MSH6, MUTYH, NBN, NF1, NHTL1, PALB2, PDGFRA, PMS2, POLD1, POLE, PTEN, RAD50, RAD51C, RAD51D, RNF43, SDHB, SDHC, SDHD, SMAD4, SMARCA4. STK11, TP53, TSC1, TSC2, and VHL.  The following genes were evaluated for sequence changes only: SDHA and HOXB13 c.251G>A variant only.    11/20/2019 - 07/18/2020 Chemotherapy   T[facility]P       12/13/2019 Surgery   Right lumpectomy Dwain Sarna): IDC, 5 right axillary lymph nodes negative for carcinoma.    01/22/2020 - 03/12/2020 Radiation Therapy   Site/dose:   The patient initially received a dose of 50.4 Gy in 28 fractions to the breast using whole-breast tangent fields. This was delivered using a 3-D conformal technique. The patient then received a boost to the seroma. This delivered an additional 10 Gy in 5 fractions using a 3-field photon boost technique. The total dose was 60.4 Gy.     03/30/2020 -  Anti-estrogen oral therapy   Anastrozole switched to Letrozole Nov 2022     CHIEF COMPLIANT: Follow-up of breast cancer    INTERVAL HISTORY: Heidi Costa is a 41 y.o. with above-mentioned history of HER-2 positive breast cancer who completed neoadjuvant chemotherapy, underwent a right lumpectomy, radiation, Herceptin Perjeta maintenance, and is currently on letrozole and goserelin. She presents to the clinic today for follow-up. She is tolerating letrozole extremely well with no side effects. She denies abdominal pain. She denies any pain or discomfort in breast.   ALLERGIES:  is allergic to aspirin.  MEDICATIONS:  Current Outpatient Medications  Medication Sig Dispense Refill   acetaminophen (TYLENOL) 500 MG tablet Take 500-1,000 mg by mouth daily as needed for pain.     albuterol (VENTOLIN HFA) 108 (90 Base) MCG/ACT inhaler Inhale 2 puffs into the lungs every 6 (six) hours as needed for wheezing or shortness of breath. 18 g 2    atorvastatin (LIPITOR) 10 MG tablet Take 1 tablet by mouth daily 30 tablet 2   baclofen (LIORESAL) 10 MG tablet TAKE 1 TABLET BY MOUTH THREE TIMES DAILY 90 tablet 0   blood glucose meter kit and supplies KIT Dispense based on patient and insurance preference. Use up to four times daily as directed. (FOR ICD-137). For QAC - HS accuchecks.  Dispense any approved glucometer with 1 month testing supplies. 1 each 1   Blood Glucose Monitoring Suppl (ACCU-CHEK GUIDE) w/Device KIT Use to check blood sugar 2 times daily 1 kit 0   buPROPion (WELLBUTRIN XL) 150 MG 24 hr tablet TAKE 1 TABLET BY MOUTH ONCE DAILY IN THE MORNING 90 tablet 0   carvedilol (COREG) 6.25 MG tablet TAKE 1 TABLET BY MOUTH TWICE DAILY WITH A MEAL 180 tablet 0   cyclobenzaprine (FLEXERIL) 5 MG tablet Take 1 tablet (5 mg total) by mouth 3 (three) times daily as needed for muscle spasms. 30 tablet 0   glucose blood test strip Use as instructed 100 each 12   letrozole (FEMARA) 2.5 MG tablet Take 1 tablet (2.5 mg total) by mouth daily. 90 tablet 3   OZEMPIC, 1 MG/DOSE, 4 MG/3ML SOPN INJECT 1MG  INTO THE SKIN ONCE A WEEK 3 mL 0   Vitamin D, Ergocalciferol, (DRISDOL) 1.25 MG (50000 UNIT) CAPS capsule Take 1 capsule (50,000 Units total) by mouth every 7 (seven) days. 12 capsule 0  No current facility-administered medications for this visit.   Facility-Administered Medications Ordered in Other Visits  Medication Dose Route Frequency Provider Last Rate Last Admin   goserelin (ZOLADEX) injection 3.6 mg  3.6 mg Subcutaneous Once Serena Croissant, MD        PHYSICAL EXAMINATION: ECOG PERFORMANCE STATUS: 1 - Symptomatic but completely ambulatory  Vitals:   03/12/22 1504  BP: (!) 146/87  Pulse: 91  Resp: 18  Temp: 97.8 F (36.6 C)  SpO2: 100%   Filed Weights   03/12/22 1504  Weight: 227 lb (103 kg)    BREAST: No palpable lumps or nodules of concern bilateral breasts.  Right side reconstructed and left side reduced. (exam performed in the  presence of a chaperone)  LABORATORY DATA:  I have reviewed the data as listed    Latest Ref Rng & Units 01/07/2022    4:49 PM 08/19/2021    4:44 PM 07/03/2021    3:39 PM  CMP  Glucose 70 - 99 mg/dL 161  096  045   BUN 6 - 24 mg/dL 11  9  9    Creatinine 0.57 - 1.00 mg/dL 4.09  8.11  9.14   Sodium 134 - 144 mmol/L 144  142  145   Potassium 3.5 - 5.2 mmol/L 4.2  4.5  4.4   Chloride 96 - 106 mmol/L 103  102  104   CO2 20 - 29 mmol/L 23  25  24    Calcium 8.7 - 10.2 mg/dL 9.8  9.9  9.7   Total Protein 6.0 - 8.5 g/dL 7.1   6.6   Total Bilirubin 0.0 - 1.2 mg/dL 0.4   0.2   Alkaline Phos 44 - 121 IU/L 110   87   AST 0 - 40 IU/L 16   16   ALT 0 - 32 IU/L 16  13  13      Lab Results  Component Value Date   WBC 7.5 01/07/2022   HGB 13.7 01/07/2022   HCT 39.8 01/07/2022   MCV 94 01/07/2022   PLT 351 01/07/2022   NEUTROABS 2.7 03/24/2021    ASSESSMENT & PLAN:  Malignant neoplasm of upper-outer quadrant of right breast in female, estrogen receptor positive (HCC) 06/21/2019: Patient palpated a painful right breast lump x3 weeks. Mammogram showed a 2.0cm right breast mass at the 10 o'clock position, a 3.6cm enlarged right axillary lymph node, and a 0.5cm left breast mass. Biopsy showed, in the right breast and axilla (lymph node), IDC, grade 3, HER-2 equivocal by IHC, + by FISH, ER+ 40%, PR - 0%, Ki67 80%,  left breast, fibrocystic changes, no malignancy.  Can be followed.   Treatment plan: 1. Neoadjuvant chemotherapy with TCH Perjeta 5 cycles started 07/12/2019 completed 10/05/2019 followed by Herceptin Perjeta completed 07/18/2020 2. breast conserving surgery with sentinel lymph node study: 12/13/2019: Pathologic complete response, 0/5 lymph nodes negative 3. Adjuvant radiation therapy 01/23/2020- 03/12/2020 4.  Followed by antiestrogen therapy with anastrozole 1 mg daily started  03/30/2020 ----------------------------------------------------------------------------------------------------------------------------------------------------------- Current treatment:  Letrozole with Zoladex Letrozole toxicities:  Abdominal cramps: She was also baclofen periodically.  She gets 4-5 abdominal cramps per week. 2. Hot flashes    Breast cancer surveillance: Mammograms and ultrasound 11/18/2021: Seroma measuring 6.7 cm in the right breast: Benign at [facility] Breast exam 03/12/2022: Benign palpable seroma  Continue monthly Zoladex injections   03/04/21: Bil Mammaplasty: Benign Hospitalization December 2022: DKA   Return to clinic in 1 year for follow-up    No orders of the  defined types were placed in this encounter.  The patient has a good understanding of the overall plan. she agrees with it. she will call with any problems that may develop before the next visit here. Total time spent: 30 mins including face to face time and time spent for planning, charting and co-ordination of care   Tamsen Meek, MD 03/12/22    I Janan Ridge am acting as a Neurosurgeon for [employer]  I have reviewed the above documentation for accuracy and completeness, and I agree with the above.

## 2022-03-12 ENCOUNTER — Inpatient Hospital Stay: Payer: BC Managed Care – PPO

## 2022-03-12 ENCOUNTER — Other Ambulatory Visit: Payer: Self-pay | Admitting: Internal Medicine

## 2022-03-12 ENCOUNTER — Inpatient Hospital Stay: Payer: BC Managed Care – PPO | Attending: Hematology and Oncology | Admitting: Hematology and Oncology

## 2022-03-12 VITALS — BP 146/87 | HR 91 | Temp 97.8°F | Resp 18 | Ht 65.0 in | Wt 227.0 lb

## 2022-03-12 DIAGNOSIS — Z79899 Other long term (current) drug therapy: Secondary | ICD-10-CM | POA: Insufficient documentation

## 2022-03-12 DIAGNOSIS — Z5111 Encounter for antineoplastic chemotherapy: Secondary | ICD-10-CM | POA: Diagnosis not present

## 2022-03-12 DIAGNOSIS — C50411 Malignant neoplasm of upper-outer quadrant of right female breast: Secondary | ICD-10-CM | POA: Diagnosis present

## 2022-03-12 DIAGNOSIS — R109 Unspecified abdominal pain: Secondary | ICD-10-CM | POA: Diagnosis not present

## 2022-03-12 DIAGNOSIS — Z95828 Presence of other vascular implants and grafts: Secondary | ICD-10-CM

## 2022-03-12 DIAGNOSIS — Z170 Estrogen receptor positive status [ER+]: Secondary | ICD-10-CM | POA: Insufficient documentation

## 2022-03-12 DIAGNOSIS — Z923 Personal history of irradiation: Secondary | ICD-10-CM | POA: Insufficient documentation

## 2022-03-12 DIAGNOSIS — Z79811 Long term (current) use of aromatase inhibitors: Secondary | ICD-10-CM | POA: Diagnosis not present

## 2022-03-12 DIAGNOSIS — R232 Flushing: Secondary | ICD-10-CM | POA: Insufficient documentation

## 2022-03-12 MED ORDER — LETROZOLE 2.5 MG PO TABS: 2.5000 mg | ORAL_TABLET | Freq: Every day | ORAL | 3 refills | Status: DC

## 2022-03-12 MED ORDER — GOSERELIN ACETATE 3.6 MG ~~LOC~~ IMPL
3.6000 mg | DRUG_IMPLANT | Freq: Once | SUBCUTANEOUS | Status: AC
  Administered 2022-03-12: 3.6 mg via SUBCUTANEOUS
  Filled 2022-03-12: qty 3.6

## 2022-03-12 NOTE — Assessment & Plan Note (Addendum)
06/21/2019: Patient palpated a painful right breast lump x3 weeks. Mammogram showed a 2.0cm right breast mass at the 10 o'clock position, a 3.6cm enlarged right axillary lymph node, and a 0.5cm left breast mass. Biopsy showed, in the right breast and axilla (lymph node), IDC, grade 3, HER-2 equivocal by IHC, + by FISH, ER+ 40%, PR - 0%, Ki67 80%,  left breast, fibrocystic changes, no malignancy.  Can be followed.   Treatment plan: 1. Neoadjuvant chemotherapy with TCH Perjeta 5 cycles started 07/12/2019 completed 10/05/2019 followed by Herceptin Perjeta completed 07/18/2020 2. breast conserving surgery with sentinel lymph node study: 12/13/2019: Pathologic complete response, 0/5 lymph nodes negative 3. Adjuvant radiation therapy 01/23/2020- 03/12/2020 4.  Followed by antiestrogen therapy with anastrozole 1 mg daily started 03/30/2020 ----------------------------------------------------------------------------------------------------------------------------------------------------------- Current treatment:  Letrozole with Zoladex Letrozole toxicities:  Abdominal cramps: She was also baclofen periodically.  She gets 4-5 abdominal cramps per week. 2. Hot flashes    Breast cancer surveillance: Mammograms and ultrasound 11/18/2021: Seroma measuring 6.7 cm in the right breast: Benign at [facility] Breast exam 03/12/2022: Benign palpable seroma  Continue monthly Zoladex injections   03/04/21: Bil Mammaplasty: Benign Hospitalization December 2022: DKA   Return to clinic in 1 year for follow-up

## 2022-03-19 ENCOUNTER — Ambulatory Visit (INDEPENDENT_AMBULATORY_CARE_PROVIDER_SITE_OTHER): Payer: BC Managed Care – PPO

## 2022-03-19 VITALS — BP 126/80 | HR 85 | Temp 98.2°F | Ht 65.0 in | Wt 227.0 lb

## 2022-03-19 DIAGNOSIS — Z862 Personal history of diseases of the blood and blood-forming organs and certain disorders involving the immune mechanism: Secondary | ICD-10-CM | POA: Diagnosis not present

## 2022-03-19 MED ORDER — CYANOCOBALAMIN 1000 MCG/ML IJ SOLN
1000.0000 ug | Freq: Once | INTRAMUSCULAR | Status: AC
  Administered 2022-03-19: 1000 ug via INTRAMUSCULAR

## 2022-03-19 NOTE — Progress Notes (Signed)
Pt presents today for 3rd b12.

## 2022-03-29 ENCOUNTER — Other Ambulatory Visit: Payer: Self-pay | Admitting: Internal Medicine

## 2022-03-29 DIAGNOSIS — F320 Major depressive disorder, single episode, mild: Secondary | ICD-10-CM

## 2022-03-31 ENCOUNTER — Encounter: Payer: Self-pay | Admitting: Hematology and Oncology

## 2022-04-01 ENCOUNTER — Ambulatory Visit (INDEPENDENT_AMBULATORY_CARE_PROVIDER_SITE_OTHER): Payer: 59 | Admitting: Internal Medicine

## 2022-04-01 ENCOUNTER — Encounter: Payer: Self-pay | Admitting: Hematology and Oncology

## 2022-04-01 ENCOUNTER — Encounter: Payer: Self-pay | Admitting: Internal Medicine

## 2022-04-01 VITALS — BP 124/80 | HR 95 | Temp 98.6°F | Ht 65.2 in | Wt 225.8 lb

## 2022-04-01 DIAGNOSIS — M542 Cervicalgia: Secondary | ICD-10-CM

## 2022-04-01 DIAGNOSIS — G9589 Other specified diseases of spinal cord: Secondary | ICD-10-CM | POA: Diagnosis not present

## 2022-04-01 DIAGNOSIS — F320 Major depressive disorder, single episode, mild: Secondary | ICD-10-CM | POA: Diagnosis not present

## 2022-04-01 DIAGNOSIS — Z6837 Body mass index (BMI) 37.0-37.9, adult: Secondary | ICD-10-CM

## 2022-04-01 MED ORDER — CYCLOBENZAPRINE HCL 5 MG PO TABS: 5.0000 mg | ORAL_TABLET | Freq: Three times a day (TID) | ORAL | 0 refills | Status: DC | PRN
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
## 2022-04-01 NOTE — Patient Instructions (Signed)

## 2022-04-04 LAB — HM DIABETES EYE EXAM

## 2022-04-09 ENCOUNTER — Other Ambulatory Visit: Payer: Self-pay

## 2022-04-09 ENCOUNTER — Inpatient Hospital Stay: Payer: 59 | Attending: Hematology and Oncology

## 2022-04-09 VITALS — BP 138/91 | HR 77 | Temp 99.0°F | Resp 18

## 2022-04-09 DIAGNOSIS — Z170 Estrogen receptor positive status [ER+]: Secondary | ICD-10-CM | POA: Diagnosis not present

## 2022-04-09 DIAGNOSIS — C50411 Malignant neoplasm of upper-outer quadrant of right female breast: Secondary | ICD-10-CM | POA: Diagnosis present

## 2022-04-09 DIAGNOSIS — Z5111 Encounter for antineoplastic chemotherapy: Secondary | ICD-10-CM | POA: Insufficient documentation

## 2022-04-09 DIAGNOSIS — Z95828 Presence of other vascular implants and grafts: Secondary | ICD-10-CM

## 2022-04-09 DIAGNOSIS — Z79811 Long term (current) use of aromatase inhibitors: Secondary | ICD-10-CM | POA: Diagnosis not present

## 2022-04-09 MED ORDER — GOSERELIN ACETATE 3.6 MG ~~LOC~~ IMPL
3.6000 mg | DRUG_IMPLANT | Freq: Once | SUBCUTANEOUS | Status: AC
  Administered 2022-04-09: 3.6 mg via SUBCUTANEOUS
  Filled 2022-04-09: qty 3.6

## 2022-04-13 ENCOUNTER — Other Ambulatory Visit: Payer: Self-pay | Admitting: Internal Medicine

## 2022-04-13 ENCOUNTER — Other Ambulatory Visit: Payer: Self-pay | Admitting: Hematology and Oncology

## 2022-04-17 ENCOUNTER — Encounter: Payer: Self-pay | Admitting: Hematology and Oncology

## 2022-04-21 ENCOUNTER — Ambulatory Visit (INDEPENDENT_AMBULATORY_CARE_PROVIDER_SITE_OTHER): Payer: 59

## 2022-04-21 VITALS — BP 124/80 | Temp 98.1°F | Ht 65.0 in | Wt 225.0 lb

## 2022-04-21 DIAGNOSIS — Z862 Personal history of diseases of the blood and blood-forming organs and certain disorders involving the immune mechanism: Secondary | ICD-10-CM

## 2022-04-21 MED ORDER — CYANOCOBALAMIN 1000 MCG/ML IJ SOLN
1000.0000 ug | Freq: Once | INTRAMUSCULAR | Status: AC
  Administered 2022-04-21: 1000 ug via INTRAMUSCULAR

## 2022-04-21 NOTE — Progress Notes (Signed)
Pt presents today for b12 injection.

## 2022-04-21 NOTE — Patient Instructions (Signed)
Vitamin B12 Deficiency Vitamin B12 deficiency means that your body does not have enough vitamin B12. The body needs this important vitamin: To make red blood cells. To make genes (DNA). To help the nerves work. If you do not have enough vitamin B12 in your body, you can have health problems, such as not having enough red blood cells in the blood (anemia). What are the causes? Not eating enough foods that contain vitamin B12. Not being able to take in (absorb) vitamin B12 from the food that you eat. Certain diseases. A condition in which the body does not make enough of a certain protein. This results in your body not taking in enough vitamin B12. Having a surgery in which part of the stomach or small intestine is taken out. Taking medicines that make it hard for the body to take in vitamin B12. These include: Heartburn medicines. Some medicines that are used to treat diabetes. What increases the risk? Being an older adult. Eating a vegetarian or vegan diet that does not include any foods that come from animals. Not eating enough foods that contain vitamin B12 while you are pregnant. Taking certain medicines. Having alcoholism. What are the signs or symptoms? In some cases, there are no symptoms. If the condition leads to too few blood cells or nerve damage, symptoms can occur, such as: Feeling weak or tired. Not being hungry. Losing feeling (numbness) or tingling in your hands and feet. Redness and burning of the tongue. Feeling sad (depressed). Confusion or memory problems. Trouble walking. If anemia is very bad, symptoms can include: Being short of breath. Being dizzy. Having a very fast heartbeat. How is this treated? Changing the way you eat and drink, such as: Eating more foods that contain vitamin B12. Drinking little or no alcohol. Getting vitamin B12 shots. Taking vitamin B12 supplements by mouth (orally). Your doctor will tell you the dose that is best for you. Follow  these instructions at home: Eating and drinking  Eat foods that come from animals and have a lot of vitamin B12 in them. These include: Meats and poultry. This includes beef, pork, chicken, turkey, and organ meats, such as liver. Seafood, such as clams, rainbow trout, salmon, tuna, and haddock. Eggs. Dairy foods such as milk, yogurt, and cheese. Eat breakfast cereals that have vitamin B12 added to them (are fortified). Check the label. The items listed above may not be a complete list of foods and beverages you can eat and drink. Contact a dietitian for more information. Alcohol use Do not drink alcohol if: Your doctor tells you not to drink. You are pregnant, may be pregnant, or are planning to become pregnant. If you drink alcohol: Limit how much you have to: 0-1 drink a day for women. 0-2 drinks a day for men. Know how much alcohol is in your drink. In the [place] one drink equals one 12 oz bottle of beer (355 mL), one 5 oz glass of wine (148 mL), or one 1 oz glass of hard liquor (44 mL). General instructions Get any vitamin B12 shots if told by your doctor. Take supplements only as told by your doctor. Follow the directions. Keep all follow-up visits. Contact a doctor if: Your symptoms come back. Your symptoms get worse or do not get better with treatment. Get help right away if: You have trouble breathing. You have a very fast heartbeat. You have chest pain. You get dizzy. You faint. These symptoms may be an emergency. Get help right away. Call 911.   Do not wait to see if the symptoms will go away. Do not drive yourself to the hospital. Summary Vitamin B12 deficiency means that your body is not getting enough of the vitamin. In some cases, there are no symptoms of this condition. Treatment may include making a change in the way you eat and drink, getting shots, or taking supplements. Eat foods that have vitamin B12 in them. This information is not intended to replace advice  given to you by your health care provider. Make sure you discuss any questions you have with your health care provider. Document Revised: 11/08/2020 Document Reviewed: 11/08/2020 Elsevier Patient Education  2023 [employer]  

## 2022-05-07 ENCOUNTER — Other Ambulatory Visit: Payer: Self-pay

## 2022-05-07 ENCOUNTER — Inpatient Hospital Stay: Payer: 59 | Attending: Hematology and Oncology

## 2022-05-07 VITALS — BP 137/89 | HR 68 | Temp 98.6°F | Resp 18

## 2022-05-07 DIAGNOSIS — C50411 Malignant neoplasm of upper-outer quadrant of right female breast: Secondary | ICD-10-CM | POA: Diagnosis not present

## 2022-05-07 DIAGNOSIS — Z5111 Encounter for antineoplastic chemotherapy: Secondary | ICD-10-CM | POA: Insufficient documentation

## 2022-05-07 DIAGNOSIS — Z170 Estrogen receptor positive status [ER+]: Secondary | ICD-10-CM | POA: Diagnosis not present

## 2022-05-07 DIAGNOSIS — Z95828 Presence of other vascular implants and grafts: Secondary | ICD-10-CM

## 2022-05-07 MED ORDER — GOSERELIN ACETATE 3.6 MG ~~LOC~~ IMPL
3.6000 mg | DRUG_IMPLANT | Freq: Once | SUBCUTANEOUS | Status: AC
  Administered 2022-05-07: 3.6 mg via SUBCUTANEOUS
  Filled 2022-05-07: qty 3.6

## 2022-05-14 ENCOUNTER — Other Ambulatory Visit: Payer: Self-pay | Admitting: Internal Medicine

## 2022-05-14 DIAGNOSIS — E559 Vitamin D deficiency, unspecified: Secondary | ICD-10-CM

## 2022-05-14 DIAGNOSIS — E782 Mixed hyperlipidemia: Secondary | ICD-10-CM

## 2022-05-28 ENCOUNTER — Ambulatory Visit: Payer: 59 | Admitting: Internal Medicine

## 2022-05-28 ENCOUNTER — Encounter: Payer: Self-pay | Admitting: Internal Medicine

## 2022-05-28 VITALS — BP 128/86 | HR 78 | Temp 98.3°F | Ht 65.0 in | Wt 218.2 lb

## 2022-05-28 DIAGNOSIS — I10 Essential (primary) hypertension: Secondary | ICD-10-CM

## 2022-05-28 DIAGNOSIS — E1169 Type 2 diabetes mellitus with other specified complication: Secondary | ICD-10-CM | POA: Diagnosis not present

## 2022-05-28 DIAGNOSIS — F320 Major depressive disorder, single episode, mild: Secondary | ICD-10-CM

## 2022-05-28 DIAGNOSIS — E66812 Obesity, class 2: Secondary | ICD-10-CM

## 2022-05-28 DIAGNOSIS — E785 Hyperlipidemia, unspecified: Secondary | ICD-10-CM | POA: Diagnosis not present

## 2022-05-28 DIAGNOSIS — E538 Deficiency of other specified B group vitamins: Secondary | ICD-10-CM | POA: Diagnosis not present

## 2022-05-28 DIAGNOSIS — E559 Vitamin D deficiency, unspecified: Secondary | ICD-10-CM

## 2022-05-28 DIAGNOSIS — C50411 Malignant neoplasm of upper-outer quadrant of right female breast: Secondary | ICD-10-CM

## 2022-05-28 DIAGNOSIS — Z6836 Body mass index (BMI) 36.0-36.9, adult: Secondary | ICD-10-CM

## 2022-05-28 DIAGNOSIS — Z170 Estrogen receptor positive status [ER+]: Secondary | ICD-10-CM

## 2022-05-28 MED ORDER — CYANOCOBALAMIN 1000 MCG/ML IJ SOLN
1000.0000 ug | Freq: Once | INTRAMUSCULAR | Status: AC
  Administered 2022-05-28: 1000 ug via INTRAMUSCULAR

## 2022-05-28 NOTE — Progress Notes (Addendum)
I,Heidi Costa,acting as a scribe for Heidi Greenland, MD.,have documented all relevant documentation on the behalf of Heidi Greenland, MD,as directed by  Heidi Greenland, MD while in the presence of Heidi Greenland, MD.    Subjective:     Patient ID: Heidi Costa , female    DOB: 01-24-1981 , 42 y.o.   MRN: [id number]   Chief Complaint  Patient presents with   Diabetes   B12 Deficiency     HPI  The patient is here today for a diabetes f/u. Patient reports compliance with her meds. She is on both Ozempic and [place]. She has not had any issues with either medication.   She adds she had a telehealth visit with [facility] on 2/22. She was diagnosed with sinus infection. She was prescribed Amoxicillin. She reports she does feel a lot better.   Diabetes She presents for her follow-up diabetic visit. She has type 2 diabetes mellitus. Her disease course has been stable. Pertinent negatives for hypoglycemia include no dizziness or headaches. Pertinent negatives for diabetes include no chest pain, no polydipsia, no polyphagia, no polyuria and no weakness. There are no hypoglycemic complications. Risk factors for coronary artery disease include diabetes mellitus and obesity.  Hypertension This is a chronic problem. The problem has been gradually improving since onset. The problem is controlled. Pertinent negatives include no chest pain or headaches.     Past Medical History:  Diagnosis Date   Abnormality of gait 08/02/2012   resolved per patient on 07/12/19   Anemia    iron taking in the past, not currently   Anxiety    Arthritis    spine- cerv.  & arm    Asthma    Bulging lumbar disc    Cancer ([place])    Right Breast-starts chemo on 07/14/19   Chronic arthralgias of knees and hips 08/02/2012   Depression    Pt. took self off Celexa one month ago, states she did n't like how it made her feel .   Family history of cancer    GERD (gastroesophageal reflux disease)    diet controlled    Heart murmur    pt. been told that this is a fact, no echo or cardiac surveillance in the past    HLD (hyperlipidemia)    no meds, diet controlled   Hypertension    MRSA (methicillin resistant Staphylococcus aureus) 03/02/2005   Obesity    Spinal stenosis      Family History  Problem Relation Age of Onset   Osteoarthritis Mother    Hypertension Mother    Diabetes Mother    Hypertension Father    Gout Father    Stroke Father 59       embolic   Diabetes Half-Brother    Hypertension Half-Brother    Heart failure Half-Brother 37   Kidney disease Half-Brother    Diabetes Paternal Grandfather    Cancer Paternal Grandfather 40       breast cancer     Current Outpatient Medications:    acetaminophen (TYLENOL) 500 MG tablet, Take 500-1,000 mg by mouth daily as needed for pain., Disp: , Rfl:    albuterol (VENTOLIN HFA) 108 (90 Base) MCG/ACT inhaler, Inhale 2 puffs into the lungs every 6 (six) hours as needed for wheezing or shortness of breath., Disp: 18 g, Rfl: 2   amoxicillin-clavulanate (AUGMENTIN) 875-125 MG tablet, Take 1 tablet by mouth 2 (two) times daily., Disp: , Rfl:    baclofen (LIORESAL)  10 MG tablet, TAKE 1 TABLET BY MOUTH THREE TIMES DAILY, Disp: 90 tablet, Rfl: 0   blood glucose meter kit and supplies KIT, Dispense based on patient and insurance preference. Use up to four times daily as directed. (FOR ICD-137). For QAC - HS accuchecks.  Dispense any approved glucometer with 1 month testing supplies., Disp: 1 each, Rfl: 1   Blood Glucose Monitoring Suppl (ACCU-CHEK GUIDE) w/Device KIT, Use to check blood sugar 2 times daily, Disp: 1 kit, Rfl: 0   buPROPion (WELLBUTRIN XL) 150 MG 24 hr tablet, TAKE 1 TABLET BY MOUTH ONCE DAILY IN THE MORNING, Disp: 90 tablet, Rfl: 1   carvedilol (COREG) 6.25 MG tablet, TAKE 1 TABLET BY MOUTH TWICE DAILY WITH A MEAL, Disp: 180 tablet, Rfl: 0   cyclobenzaprine (FLEXERIL) 5 MG tablet, Take 1 tablet (5 mg total) by mouth 3 (three) times daily  as needed for muscle spasms., Disp: 30 tablet, Rfl: 0   dapagliflozin propanediol (FARXIGA) 10 MG TABS tablet, Take 10 mg by mouth daily., Disp: , Rfl:    glucose blood test strip, Use as instructed, Disp: 100 each, Rfl: 12   letrozole (FEMARA) 2.5 MG tablet, Take 1 tablet (2.5 mg total) by mouth daily., Disp: 90 tablet, Rfl: 3   Semaglutide, 1 MG/DOSE, (OZEMPIC, 1 MG/DOSE,) 4 MG/3ML SOPN, INJECT 1 MG INTO THE SKIN ONCE A WEEK, Disp: 9 mL, Rfl: 1   Vitamin D, Ergocalciferol, (DRISDOL) 1.25 MG (50000 UNIT) CAPS capsule, TAKE 1 CAPSULE BY MOUTH EVERY 7 DAYS, Disp: 12 capsule, Rfl: 0   atorvastatin (LIPITOR) 10 MG tablet, Take 1 tablet by mouth once daily, Disp: 30 tablet, Rfl: 0   Allergies  Allergen Reactions   Aspirin Rash     Review of Systems  Constitutional: Negative.   Respiratory: Negative.    Cardiovascular: Negative.  Negative for chest pain.  Gastrointestinal: Negative.   Endocrine: Negative for polydipsia, polyphagia and polyuria.  Neurological: Negative.  Negative for dizziness, weakness and headaches.  Psychiatric/Behavioral: Negative.       Today's Vitals   05/28/22 1532  BP: 128/86  Pulse: 78  Temp: 98.3 F (36.8 C)  SpO2: 98%  Weight: 218 lb 3.2 oz (99 kg)  Height: '5\' 5"'$  (1.651 m)   Body mass index is 36.31 kg/m.  Wt Readings from Last 3 Encounters:  05/28/22 218 lb 3.2 oz (99 kg)  04/21/22 225 lb (102.1 kg)  04/01/22 225 lb 12.8 oz (102.4 kg)    BP Readings from Last 3 Encounters:  06/04/22 137/83  05/28/22 128/86  05/07/22 137/89     Objective:  Physical Exam Vitals and nursing note reviewed.  Constitutional:      Appearance: Normal appearance.  HENT:     Head: Normocephalic and atraumatic.  Cardiovascular:     Rate and Rhythm: Normal rate and regular rhythm.     Heart sounds: Normal heart sounds.  Pulmonary:     Effort: Pulmonary effort is normal.     Breath sounds: Normal breath sounds.  Skin:    General: Skin is warm.  Neurological:      General: No focal deficit present.     Mental Status: She is alert.  Psychiatric:        Mood and Affect: Mood normal.        Behavior: Behavior normal.      Assessment And Plan:     1. Dyslipidemia associated with type 2 diabetes mellitus ([place]) Comments: Chronic, she will c/w Ozempic and [place]. She  is encouraged to limit her intake of sugary beverages/foods and artificial sweeteners. - Hemoglobin A1c - BMP8+eGFR  2. Essential hypertension, benign Comments: Chronic, goal BP < 120/80. Sh will c/w carvedilol for now. May need to add low dose ARB in the future.  3. Vitamin B12 deficiency - cyanocobalamin (VITAMIN B12) injection 1,000 mcg - Vitamin B12  4. Malignant neoplasm of upper-outer quadrant of right breast in female, estrogen receptor positive ([place]) Comments: Recent Oncology note reviewed.  She is s/p lumpectomy, chemo and XRT. She is currently on adjuvant therapy w/ Femara and Zoladex.  5. Class 2 severe obesity due to excess calories with serious comorbidity and body mass index (BMI) of 36.0 to 36.9 in adult [facility]) Comments: She is encouraged to strive for BMI<30 to decrease cardiac risk, while exercising at least 150 minutes/week.  Patient was given opportunity to ask questions. Patient verbalized understanding of the plan and was able to repeat key elements of the plan. All questions were answered to their satisfaction.   I, Heidi Greenland, MD, have reviewed all documentation for this visit. The documentation on 06/10/22 for the exam, diagnosis, procedures, and orders are all accurate and complete.   IF YOU HAVE BEEN REFERRED TO A SPECIALIST, IT MAY TAKE 1-2 WEEKS TO SCHEDULE/PROCESS THE REFERRAL. IF YOU HAVE NOT HEARD FROM US/SPECIALIST IN TWO WEEKS, PLEASE GIVE [place] A CALL AT [phone].   THE PATIENT IS ENCOURAGED TO PRACTICE SOCIAL DISTANCING DUE TO THE COVID-19 PANDEMIC.

## 2022-05-28 NOTE — Patient Instructions (Signed)

## 2022-05-29 LAB — BMP8+EGFR
BUN/Creatinine Ratio: 13 (ref 9–23)
BUN: 10 mg/dL (ref 6–24)
CO2: 25 mmol/L (ref 20–29)
Calcium: 9.4 mg/dL (ref 8.7–10.2)
Chloride: 106 mmol/L (ref 96–106)
Creatinine, Ser: 0.78 mg/dL (ref 0.57–1.00)
Glucose: 98 mg/dL (ref 70–99)
Potassium: 4.2 mmol/L (ref 3.5–5.2)
Sodium: 145 mmol/L — ABNORMAL HIGH (ref 134–144)
eGFR: 97 mL/min/{1.73_m2} (ref >59)

## 2022-05-29 LAB — HEMOGLOBIN A1C
Est. average glucose Bld gHb Est-mCnc: 126 mg/dL
Hgb A1c MFr Bld: 6 % — ABNORMAL HIGH (ref 4.8–5.6)

## 2022-05-29 LAB — VITAMIN B12: Vitamin B-12: 394 pg/mL (ref 232–1245)

## 2022-06-04 ENCOUNTER — Other Ambulatory Visit: Payer: Self-pay

## 2022-06-04 ENCOUNTER — Inpatient Hospital Stay: Payer: 59 | Attending: Hematology and Oncology

## 2022-06-04 VITALS — BP 137/83 | HR 70 | Temp 99.2°F | Resp 18

## 2022-06-04 DIAGNOSIS — L03311 Cellulitis of abdominal wall: Secondary | ICD-10-CM | POA: Diagnosis not present

## 2022-06-04 DIAGNOSIS — J45909 Unspecified asthma, uncomplicated: Secondary | ICD-10-CM | POA: Insufficient documentation

## 2022-06-04 DIAGNOSIS — Z923 Personal history of irradiation: Secondary | ICD-10-CM | POA: Insufficient documentation

## 2022-06-04 DIAGNOSIS — Z95828 Presence of other vascular implants and grafts: Secondary | ICD-10-CM

## 2022-06-04 DIAGNOSIS — Z7984 Long term (current) use of oral hypoglycemic drugs: Secondary | ICD-10-CM | POA: Insufficient documentation

## 2022-06-04 DIAGNOSIS — C50411 Malignant neoplasm of upper-outer quadrant of right female breast: Secondary | ICD-10-CM | POA: Diagnosis not present

## 2022-06-04 DIAGNOSIS — Z79899 Other long term (current) drug therapy: Secondary | ICD-10-CM | POA: Insufficient documentation

## 2022-06-04 DIAGNOSIS — Z170 Estrogen receptor positive status [ER+]: Secondary | ICD-10-CM | POA: Diagnosis not present

## 2022-06-04 DIAGNOSIS — K219 Gastro-esophageal reflux disease without esophagitis: Secondary | ICD-10-CM | POA: Insufficient documentation

## 2022-06-04 DIAGNOSIS — Z803 Family history of malignant neoplasm of breast: Secondary | ICD-10-CM | POA: Diagnosis not present

## 2022-06-04 DIAGNOSIS — I10 Essential (primary) hypertension: Secondary | ICD-10-CM | POA: Diagnosis not present

## 2022-06-04 DIAGNOSIS — Z5111 Encounter for antineoplastic chemotherapy: Secondary | ICD-10-CM | POA: Diagnosis present

## 2022-06-04 DIAGNOSIS — E785 Hyperlipidemia, unspecified: Secondary | ICD-10-CM | POA: Diagnosis not present

## 2022-06-04 DIAGNOSIS — Z79811 Long term (current) use of aromatase inhibitors: Secondary | ICD-10-CM | POA: Diagnosis not present

## 2022-06-04 MED ORDER — GOSERELIN ACETATE 3.6 MG ~~LOC~~ IMPL
3.6000 mg | DRUG_IMPLANT | Freq: Once | SUBCUTANEOUS | Status: AC
  Administered 2022-06-04: 3.6 mg via SUBCUTANEOUS
  Filled 2022-06-04: qty 3.6

## 2022-06-04 NOTE — Patient Instructions (Signed)
Goserelin Implant What is this medication? GOSERELIN (GOE se rel in) treats prostate cancer and breast cancer. It works by decreasing levels of the hormones testosterone and estrogen in the body. This prevents prostate and breast cancer cells from spreading or growing. It may also be used to treat endometriosis. This is a condition where the tissue that lines the uterus grows outside the uterus. It works by decreasing the amount of estrogen your body makes, which reduces heavy bleeding and pain. It can also be used to help thin the lining of the uterus before a surgery used to prevent or reduce heavy periods. This medicine may be used for other purposes; ask your health care provider or pharmacist if you have questions. COMMON BRAND NAME(S): Zoladex, Zoladex 3-Month What should I tell my care team before I take this medication? They need to know if you have any of these conditions: Bone problems Diabetes Heart disease History of irregular heartbeat or rhythm An unusual or allergic reaction to goserelin, other medications, foods, dyes, or preservatives Pregnant or trying to get pregnant Breastfeeding How should I use this medication? This medication is injected under the skin. It is given by your care team in a hospital or clinic setting. Talk to your care team about the use of this medication in children. Special care may be needed. Overdosage: If you think you have taken too much of this medicine contact a poison control center or emergency room at once. NOTE: This medicine is only for you. Do not share this medicine with others. What if I miss a dose? Keep appointments for follow-up doses. It is important not to miss your dose. Call your care team if you are unable to keep an appointment. What may interact with this medication? Do not take this medication with any of the following: Cisapride Dronedarone Pimozide Thioridazine This medication may also interact with the following: Other  medications that cause heart rhythm changes This list may not describe all possible interactions. Give your health care provider a list of all the medicines, herbs, non-prescription drugs, or dietary supplements you use. Also tell them if you smoke, drink alcohol, or use illegal drugs. Some items may interact with your medicine. What should I watch for while using this medication? Visit your care team for regular checks on your progress. Your symptoms may appear to get worse during the first weeks of this therapy. Tell your care team if your symptoms do not start to get better or if they get worse after this time. Using this medication for a long time may weaken your bones. If you smoke or frequently drink alcohol you may increase your risk of bone loss. A family history of osteoporosis, chronic use of medications for seizures (convulsions), or corticosteroids can also increase your risk of bone loss. The risk of bone fractures may be increased. Talk to your care team about your bone health. This medication may increase blood sugar. The risk may be higher in patients who already have diabetes. Ask your care team what you can do to lower your risk of diabetes while taking this medication. This medication should stop regular monthly menstruation in women. Tell your care team if you continue to menstruate. Talk to your care team if you wish to become pregnant or think you might be pregnant. This medication can cause serious birth defects if taken during pregnancy or for 12 weeks after stopping treatment. Talk to your care team about reliable forms of contraception. Do not breastfeed while taking this   medication. This medication may cause infertility. Talk to your care team if you are concerned about your fertility. What side effects may I notice from receiving this medication? Side effects that you should report to your care team as soon as possible: Allergic reactions--skin rash, itching, hives, swelling  of the face, lips, tongue, or throat Change in the amount of urine Heart attack--pain or tightness in the chest, shoulders, arms, or jaw, nausea, shortness of breath, cold or clammy skin, feeling faint or lightheaded Heart rhythm changes--fast or irregular heartbeat, dizziness, feeling faint or lightheaded, chest pain, trouble breathing High blood sugar (hyperglycemia)--increased thirst or amount of urine, unusual weakness or fatigue, blurry vision High calcium level--increased thirst or amount of urine, nausea, vomiting, confusion, unusual weakness or fatigue, bone pain Pain, redness, irritation, or bruising at the injection site Severe back pain, numbness or weakness of the hands, arms, legs, or feet, loss of coordination, loss of bowel or bladder control Stroke--sudden numbness or weakness of the face, arm, or leg, trouble speaking, confusion, trouble walking, loss of balance or coordination, dizziness, severe headache, change in vision Swelling and pain of the tumor site or lymph nodes Trouble passing urine Side effects that usually do not require medical attention (report to your care team if they continue or are bothersome): Change in sex drive or performance Headache Hot flashes Rapid or extreme change in emotion or mood Sweating Swelling of the ankles, hands, or feet Unusual vaginal discharge, itching, or odor This list may not describe all possible side effects. Call your doctor for medical advice about side effects. You may report side effects to FDA at 1-800-FDA-1088. Where should I keep my medication? This medication is given in a hospital or clinic. It will not be stored at home. NOTE: This sheet is a summary. It may not cover all possible information. If you have questions about this medicine, talk to your doctor, pharmacist, or health care provider.  2023 Elsevier/Gold Standard (2021-07-30 00:00:00)  

## 2022-06-09 ENCOUNTER — Other Ambulatory Visit: Payer: Self-pay | Admitting: Internal Medicine

## 2022-06-09 DIAGNOSIS — E782 Mixed hyperlipidemia: Secondary | ICD-10-CM

## 2022-06-12 ENCOUNTER — Inpatient Hospital Stay (HOSPITAL_BASED_OUTPATIENT_CLINIC_OR_DEPARTMENT_OTHER): Payer: 59 | Admitting: Physician Assistant

## 2022-06-12 ENCOUNTER — Other Ambulatory Visit: Payer: Self-pay

## 2022-06-12 ENCOUNTER — Telehealth: Payer: Self-pay | Admitting: *Deleted

## 2022-06-12 VITALS — BP 137/94 | HR 78 | Temp 98.4°F | Resp 18 | Wt 219.7 lb

## 2022-06-12 DIAGNOSIS — C50411 Malignant neoplasm of upper-outer quadrant of right female breast: Secondary | ICD-10-CM | POA: Diagnosis not present

## 2022-06-12 DIAGNOSIS — Z5111 Encounter for antineoplastic chemotherapy: Secondary | ICD-10-CM | POA: Diagnosis not present

## 2022-06-12 DIAGNOSIS — L03311 Cellulitis of abdominal wall: Secondary | ICD-10-CM | POA: Diagnosis not present

## 2022-06-12 DIAGNOSIS — Z170 Estrogen receptor positive status [ER+]: Secondary | ICD-10-CM | POA: Diagnosis not present

## 2022-06-12 MED ORDER — CEPHALEXIN 500 MG PO CAPS: 500.0000 mg | ORAL_CAPSULE | Freq: Two times a day (BID) | ORAL | 0 refills | Status: AC

## 2022-06-12 NOTE — Telephone Encounter (Signed)
Received call from pt with bruising, pain, and swelling at Zoladex injection site x 8 days. Pt denies fever or purulent drainage at this time. Per MD pt needing to be seen by [facility].  Appt scheduled, pt verbalized understanding of appt date and time.

## 2022-06-12 NOTE — Progress Notes (Signed)
Symptom Management Consult Note [place]    Patient Care Team: Glendale Chard, MD as PCP - General (Internal Medicine) Rockwell Germany, RN as Oncology Nurse Navigator Mauro Kaufmann, RN as Oncology Nurse Navigator Rolm Bookbinder, MD as Consulting Physician (General Surgery) Nicholas Lose, MD as Consulting Physician (Hematology and Oncology) Kyung Rudd, MD as Consulting Physician (Radiation Oncology)    Name / MRN / DOB: Heidi Costa  [id number]  12/26/1980   Date of visit: 06/12/2022   Chief Complaint/Reason for visit: injection site pain   Current Therapy: Zoladex [place] and PO letrozole  Last treatment: 06/04/22   ASSESSMENT & PLAN: Patient is a 42 y.o. female  with oncologic history of malignant neoplasm of upper-outer quadrant of right breast in female, estrogen receptor positive followed by Dr. Lindi Adie.  I have viewed most recent oncology note and lab work.    #Malignant neoplasm of upper-outer quadrant of right breast in female, estrogen receptor positive - Next injection scheduled for 07/02/22   #Injection site pain -Exam suggestive of cellulitis. She had Zoladex and Ozempic injection in the same area just days apart. There is an area of induration, so will treat with course of keflex. Also encouraged to use warm compress. No systemic symptoms therefore lab work is not necessary at this time. - Strict ED precautions discussed should symptoms worsen.    Heme/Onc History: Oncology History  Malignant neoplasm of upper-outer quadrant of right breast in female, estrogen receptor positive ([place])  06/21/2019 Initial Diagnosis   Patient palpated a painful right breast lump x3 weeks. Mammogram showed a 2.0cm right breast mass at the 10 o'clock position, a 3.6cm enlarged right axillary lymph node, and a 0.5cm left breast mass. Biopsy showed, in the right breast and axilla, IDC, grade 3, HER-2 equivocal by IHC, + by FISH, ER+ 40%, PR - 0%, Ki67 80%, and  in the left breast, fibrocystic changes, no malignancy.    07/04/2019 Cancer Staging   Staging form: Breast, AJCC 8th Edition - Clinical stage from 07/04/2019: Stage IIB (cT2, cN1(f), cM0, G3, ER+, PR-, HER2+) - Signed by Nicholas Lose, MD on 07/05/2019   07/14/2019 - 10/07/2019 Chemotherapy   The patient had dexamethasone (DECADRON) 4 MG tablet, 4 mg (100 % of original dose 4 mg), Oral, Daily, 1 of 1 cycle, Start date: 07/05/2019, End date: 08/04/2019 Dose modification: 4 mg (original dose 4 mg, Cycle 0) palonosetron (ALOXI) injection 0.25 mg, 0.25 mg, Intravenous,  Once, 5 of 5 cycles Administration: 0.25 mg (07/14/2019), 0.25 mg (08/04/2019), 0.25 mg (10/05/2019), 0.25 mg (08/25/2019), 0.25 mg (09/14/2019) pegfilgrastim-jmdb (FULPHILA) injection 6 mg, 6 mg, Subcutaneous,  Once, 5 of 5 cycles Administration: 6 mg (07/17/2019), 6 mg (08/07/2019), 6 mg (10/07/2019), 6 mg (08/29/2019), 6 mg (09/16/2019) CARBOplatin (PARAPLATIN) 700 mg in sodium chloride 0.9 % 250 mL chemo infusion, 700 mg (100 % of original dose 700 mg), Intravenous,  Once, 5 of 5 cycles Dose modification: 700 mg (original dose 700 mg, Cycle 1, Reason: Provider Judgment), 600 mg (original dose 700 mg, Cycle 2, Reason: Dose not tolerated) Administration: 700 mg (07/14/2019), 600 mg (08/04/2019), 600 mg (10/05/2019), 600 mg (08/25/2019), 600 mg (09/14/2019) DOCEtaxel (TAXOTERE) 170 mg in sodium chloride 0.9 % 250 mL chemo infusion, 75 mg/m2 = 170 mg, Intravenous,  Once, 5 of 5 cycles Dose modification: 65 mg/m2 (original dose 75 mg/m2, Cycle 2, Reason: Dose not tolerated) Administration: 170 mg (07/14/2019), 150 mg (08/04/2019), 150 mg (10/05/2019), 150 mg (08/25/2019), 150 mg (  09/14/2019) fosaprepitant (EMEND) 150 mg in sodium chloride 0.9 % 145 mL IVPB, 150 mg, Intravenous,  Once, 5 of 5 cycles Administration: 150 mg (07/14/2019), 150 mg (08/04/2019), 150 mg (10/05/2019), 150 mg (08/25/2019), 150 mg (09/14/2019) pertuzumab (PERJETA) 840 mg in sodium chloride 0.9 % 250 mL  chemo infusion, 840 mg, Intravenous, Once, 5 of 5 cycles Administration: 840 mg (07/14/2019), 420 mg (08/04/2019), 420 mg (10/05/2019), 420 mg (08/25/2019), 420 mg (09/14/2019) trastuzumab-dkst (OGIVRI) 900 mg in sodium chloride 0.9 % 250 mL chemo infusion, 903 mg, Intravenous,  Once, 5 of 5 cycles Administration: 900 mg (07/14/2019), 672 mg (08/04/2019), 672 mg (10/05/2019), 672 mg (08/25/2019), 672 mg (09/14/2019)  for chemotherapy treatment.    07/22/2019 Genetic Testing   Negative genetic testing:  No pathogenic variants detected on the [facility] Common Hereditary Cancers Panel. The report date is 07/22/2019.  The Common Hereditary Cancers Panel offered by [facility] includes sequencing and/or deletion duplication testing of the following 48 genes: APC, ATM, AXIN2, BARD1, BMPR1A, BRCA1, BRCA2, BRIP1, CDH1, CDK4, CDKN2A (p14ARF), CDKN2A (p16INK4a), CHEK2, CTNNA1, DICER1, EPCAM (Deletion/duplication testing only), GREM1 (promoter region deletion/duplication testing only), KIT, MEN1, MLH1, MSH2, MSH3, MSH6, MUTYH, NBN, NF1, NHTL1, PALB2, PDGFRA, PMS2, POLD1, POLE, PTEN, RAD50, RAD51C, RAD51D, RNF43, SDHB, SDHC, SDHD, SMAD4, SMARCA4. STK11, TP53, TSC1, TSC2, and VHL.  The following genes were evaluated for sequence changes only: SDHA and HOXB13 c.251G>A variant only.    11/20/2019 - 07/18/2020 Chemotherapy   [facility]       12/13/2019 Surgery   Right lumpectomy [name]): IDC, 5 right axillary lymph nodes negative for carcinoma.    01/22/2020 - 03/12/2020 Radiation Therapy   Site/dose:   The patient initially received a dose of 50.4 Gy in 28 fractions to the breast using whole-breast tangent fields. This was delivered using a 3-D conformal technique. The patient then received a boost to the seroma. This delivered an additional 10 Gy in 5 fractions using a 3-field photon boost technique. The total dose was 60.4 Gy.     03/30/2020 -  Anti-estrogen oral therapy   Anastrozole switched to Letrozole Nov 2022        Interval history-: Heidi Costa is a 42 y.o. female with oncologic history as above presenting to [facility] today with chief complaint of injection site pain. She presents unaccompanied to clinic today.  Patient had Zoladex injection 06/04/2022.  She is reported when they were doing the injection as the needle was withdrawn she did have brisk bleeding.  This is unusual and has not happened in the past.  The next day patient noticed a large area of purple bruising around the site which also was unusual and she has not experienced in the past.  Patient is endorsing mild tenderness to palpation of the area.  She rates the pain 4 out of 10 in severity.  She states the purple color has changed and is now more of a red/skin color.  She still has tenderness.  She denies any additional injury although does report that she had her Ozempic injection in that same area x 4 days later.  She has not had any fever or chills.  She has not taken any over-the-counter medications.   ROS  All other systems are reviewed and are negative for acute change except as noted in the HPI.    Allergies  Allergen Reactions   Aspirin Rash     Past Medical History:  Diagnosis Date   Abnormality of gait 08/02/2012   resolved per patient on  07/12/19   Anemia    iron taking in the past, not currently   Anxiety    Arthritis    spine- cerv.  & arm    Asthma    Bulging lumbar disc    Cancer ([place])    Right Breast-starts chemo on 07/14/19   Chronic arthralgias of knees and hips 08/02/2012   Depression    Pt. took self off Celexa one month ago, states she did n't like how it made her feel .   Family history of cancer    GERD (gastroesophageal reflux disease)    diet controlled   Heart murmur    pt. been told that this is a fact, no echo or cardiac surveillance in the past    HLD (hyperlipidemia)    no meds, diet controlled   Hypertension    MRSA (methicillin resistant Staphylococcus aureus) 03/02/2005   Obesity     Spinal stenosis      Past Surgical History:  Procedure Laterality Date   ANTERIOR CERVICAL DECOMP/DISCECTOMY FUSION N/A 10/27/2012   Procedure: ANTERIOR CERVICAL DECOMPRESSION/DISCECTOMY FUSION 1 LEVEL;  Surgeon: Faythe Ghee, MD;  Location: [facility];  Service: Neurosurgery;  Laterality: N/A;  ANTERIOR CERVICAL DECOMPRESSION/DISCECTOMY FUSION 1 LEVEL   BREAST LUMPECTOMY WITH RADIOACTIVE SEED AND SENTINEL LYMPH NODE BIOPSY Right 12/13/2019   Procedure: RIGHT BREAST LUMPECTOMY WITH RADIOACTIVE SEED AND RIGHT AXILLARY  SENTINEL LYMPH NODE BIOPSY, RIGHT AXILLARY NODE SEED GUIDED EXCISION, BLUE DYE INJECTION;  Surgeon: Rolm Bookbinder, MD;  Location: [place];  Service: General;  Laterality: Right;  PEC BLOCK   BREAST REDUCTION SURGERY Bilateral 03/04/2021   Procedure: MAMMARY REDUCTION  (BREAST);  Surgeon: Contogiannis, Audrea Muscat, MD;  Location: [place];  Service: Plastics;  Laterality: Bilateral;   CHALAZION EXCISION     L eye    PORTACATH PLACEMENT Left 07/13/2019   Procedure: INSERTION PORT-A-CATH WITH ULTRASOUND;  Surgeon: Rolm Bookbinder, MD;  Location: [place];  Service: General;  Laterality: Left;   VAGINAL DELIVERY  2002   WISDOM TOOTH EXTRACTION      Social History   Socioeconomic History   Marital status: Married    Spouse name: Not on file   Number of children: 1   Years of education: associates   Highest education level: Not on file  Occupational History    Employer: OTHER  Tobacco Use   Smoking status: Former    Packs/day: 1.00    Years: 10.00    Additional pack years: 0.00    Total pack years: 10.00    Types: Cigarettes    Quit date: 06/24/2020    Years since quitting: 1.9   Smokeless tobacco: Never  Vaping Use   Vaping Use: Never used  Substance and Sexual Activity   Alcohol use: Yes    Comment: occas   Drug use: Not Currently    Types: Marijuana    Comment: uses daily - last use 07/12/19   Sexual activity: Yes     Birth control/protection: None  Other Topics Concern   Not on file  Social History Narrative   Patient is married with one child.   Patient is right handed.   Patient has a Associate's degree.   Patient drinks 2-3 cups daily.   Social Determinants of Health   Financial Resource Strain: Not on file  Food Insecurity: Not on file  Transportation Needs: Not on file  Physical Activity: Not on file  Stress: Not on file  Social Connections:  Not on file  Intimate Partner Violence: Not on file    Family History  Problem Relation Age of Onset   Osteoarthritis Mother    Hypertension Mother    Diabetes Mother    Hypertension Father    Gout Father    Stroke Father 24       embolic   Diabetes Half-Brother    Hypertension Half-Brother    Heart failure Half-Brother 45   Kidney disease Half-Brother    Diabetes Paternal Grandfather    Cancer Paternal Grandfather 104       breast cancer     Current Outpatient Medications:    cephALEXin (KEFLEX) 500 MG capsule, Take 1 capsule (500 mg total) by mouth 2 (two) times daily for 7 days., Disp: 14 capsule, Rfl: 0   acetaminophen (TYLENOL) 500 MG tablet, Take 500-1,000 mg by mouth daily as needed for pain., Disp: , Rfl:    albuterol (VENTOLIN HFA) 108 (90 Base) MCG/ACT inhaler, Inhale 2 puffs into the lungs every 6 (six) hours as needed for wheezing or shortness of breath., Disp: 18 g, Rfl: 2   atorvastatin (LIPITOR) 10 MG tablet, Take 1 tablet by mouth once daily, Disp: 30 tablet, Rfl: 0   baclofen (LIORESAL) 10 MG tablet, TAKE 1 TABLET BY MOUTH THREE TIMES DAILY, Disp: 90 tablet, Rfl: 0   blood glucose meter kit and supplies KIT, Dispense based on patient and insurance preference. Use up to four times daily as directed. (FOR ICD-137). For QAC - HS accuchecks.  Dispense any approved glucometer with 1 month testing supplies., Disp: 1 each, Rfl: 1   Blood Glucose Monitoring Suppl (ACCU-CHEK GUIDE) w/Device KIT, Use to check blood sugar 2 times daily,  Disp: 1 kit, Rfl: 0   buPROPion (WELLBUTRIN XL) 150 MG 24 hr tablet, TAKE 1 TABLET BY MOUTH ONCE DAILY IN THE MORNING, Disp: 90 tablet, Rfl: 1   carvedilol (COREG) 6.25 MG tablet, TAKE 1 TABLET BY MOUTH TWICE DAILY WITH A MEAL, Disp: 180 tablet, Rfl: 0   cyclobenzaprine (FLEXERIL) 5 MG tablet, Take 1 tablet (5 mg total) by mouth 3 (three) times daily as needed for muscle spasms., Disp: 30 tablet, Rfl: 0   dapagliflozin propanediol (FARXIGA) 10 MG TABS tablet, Take 10 mg by mouth daily., Disp: , Rfl:    glucose blood test strip, Use as instructed, Disp: 100 each, Rfl: 12   letrozole (FEMARA) 2.5 MG tablet, Take 1 tablet (2.5 mg total) by mouth daily., Disp: 90 tablet, Rfl: 3   Semaglutide, 1 MG/DOSE, (OZEMPIC, 1 MG/DOSE,) 4 MG/3ML SOPN, INJECT 1 MG INTO THE SKIN ONCE A WEEK, Disp: 9 mL, Rfl: 1   Vitamin D, Ergocalciferol, (DRISDOL) 1.25 MG (50000 UNIT) CAPS capsule, TAKE 1 CAPSULE BY MOUTH EVERY 7 DAYS, Disp: 12 capsule, Rfl: 0  PHYSICAL EXAM: ECOG FS:1 - Symptomatic but completely ambulatory    Vitals:   06/12/22 1444  BP: (!) 137/94  Pulse: 78  Resp: 18  Temp: 98.4 F (36.9 C)  TempSrc: Oral  SpO2: 99%  Weight: 219 lb 11.2 oz (99.7 kg)   Physical Exam Vitals and nursing note reviewed.  Constitutional:      Appearance: She is well-developed. She is not ill-appearing or toxic-appearing.  HENT:     Head: Normocephalic.     Nose: Nose normal.  Eyes:     Conjunctiva/sclera: Conjunctivae normal.  Neck:     Vascular: No JVD.  Cardiovascular:     Rate and Rhythm: Normal rate and regular rhythm.  Pulses: Normal pulses.     Heart sounds: Normal heart sounds.  Pulmonary:     Effort: Pulmonary effort is normal.     Breath sounds: Normal breath sounds.  Abdominal:     General: There is no distension.    Musculoskeletal:     Cervical back: Normal range of motion.  Skin:    General: Skin is warm and dry.  Neurological:     Mental Status: She is oriented to person, place, and  time.        LABORATORY DATA: I have reviewed the data as listed    Latest Ref Rng & Units 01/07/2022    4:49 PM 03/27/2021   10:59 AM 03/24/2021    2:45 AM  CBC  WBC 3.4 - 10.8 x10E3/uL 7.5  5.6  5.3   Hemoglobin 11.1 - 15.9 g/dL 13.7  11.1  10.7   Hematocrit 34.0 - 46.6 % 39.8  32.9  31.3   Platelets 150 - 450 x10E3/uL 351  358  319         Latest Ref Rng & Units 05/28/2022    4:27 PM 01/07/2022    4:49 PM 08/19/2021    4:44 PM  CMP  Glucose 70 - 99 mg/dL 98  117  106   BUN 6 - 24 mg/dL 10  11  9    Creatinine 0.57 - 1.00 mg/dL 0.78  0.98  0.98   Sodium 134 - 144 mmol/L 145  144  142   Potassium 3.5 - 5.2 mmol/L 4.2  4.2  4.5   Chloride 96 - 106 mmol/L 106  103  102   CO2 20 - 29 mmol/L 25  23  25    Calcium 8.7 - 10.2 mg/dL 9.4  9.8  9.9   Total Protein 6.0 - 8.5 g/dL  7.1    Total Bilirubin 0.0 - 1.2 mg/dL  0.4    Alkaline Phos 44 - 121 IU/L  110    AST 0 - 40 IU/L  16    ALT 0 - 32 IU/L  16  13        RADIOGRAPHIC STUDIES (from last 24 hours if applicable) I have personally reviewed the radiological images as listed and agreed with the findings in the report. No results found.      Visit Diagnosis: 1. Malignant neoplasm of upper-outer quadrant of right breast in female, estrogen receptor positive ([place])   2. Abdominal wall cellulitis      No orders of the defined types were placed in this encounter.   All questions were answered. The patient knows to call the clinic with any problems, questions or concerns. No barriers to learning was detected.  I have spent a total of 30 minutes minutes of face-to-face and non-face-to-face time, preparing to see the patient, obtaining and/or reviewing separately obtained history, performing a medically appropriate examination, counseling and educating the patient, ordering medication, documenting clinical information in the electronic health record.  Thank you for allowing me to participate in the care of this  patient.    Barrie Folk, PA-C Department of Hematology/Oncology [facility] Phone: [phone]  [phone]    06/12/2022 4:20 PM

## 2022-06-19 ENCOUNTER — Other Ambulatory Visit: Payer: Self-pay | Admitting: Nurse Practitioner

## 2022-07-02 ENCOUNTER — Inpatient Hospital Stay: Payer: 59 | Attending: Hematology and Oncology

## 2022-07-02 ENCOUNTER — Other Ambulatory Visit: Payer: Self-pay

## 2022-07-02 VITALS — BP 135/79 | HR 54 | Temp 99.0°F | Resp 18

## 2022-07-02 DIAGNOSIS — C50411 Malignant neoplasm of upper-outer quadrant of right female breast: Secondary | ICD-10-CM | POA: Diagnosis not present

## 2022-07-02 DIAGNOSIS — Z5111 Encounter for antineoplastic chemotherapy: Secondary | ICD-10-CM | POA: Insufficient documentation

## 2022-07-02 DIAGNOSIS — Z95828 Presence of other vascular implants and grafts: Secondary | ICD-10-CM

## 2022-07-02 DIAGNOSIS — Z170 Estrogen receptor positive status [ER+]: Secondary | ICD-10-CM | POA: Insufficient documentation

## 2022-07-02 MED ORDER — GOSERELIN ACETATE 3.6 MG ~~LOC~~ IMPL
3.6000 mg | DRUG_IMPLANT | Freq: Once | SUBCUTANEOUS | Status: AC
  Administered 2022-07-02: 3.6 mg via SUBCUTANEOUS
  Filled 2022-07-02: qty 3.6

## 2022-07-28 ENCOUNTER — Telehealth: Payer: Self-pay | Admitting: *Deleted

## 2022-07-28 NOTE — Telephone Encounter (Signed)
Received call from pt with complaint of new right breast nodule x 3 days.  Pt states the nodule is tender to the touch and the surrounding area is red. Pt denies recent injury or trauma and requesting office visit for breast exam.  Appt scheduled, pt notified and verbalized understanding of appt date and time.

## 2022-07-29 ENCOUNTER — Other Ambulatory Visit: Payer: Self-pay | Admitting: Nurse Practitioner

## 2022-07-30 ENCOUNTER — Inpatient Hospital Stay (HOSPITAL_BASED_OUTPATIENT_CLINIC_OR_DEPARTMENT_OTHER): Payer: 59 | Admitting: Adult Health

## 2022-07-30 ENCOUNTER — Inpatient Hospital Stay: Payer: 59 | Attending: Hematology and Oncology

## 2022-07-30 VITALS — BP 138/89 | HR 64 | Temp 97.3°F | Resp 18 | Ht 65.0 in | Wt 212.6 lb

## 2022-07-30 DIAGNOSIS — E785 Hyperlipidemia, unspecified: Secondary | ICD-10-CM | POA: Diagnosis not present

## 2022-07-30 DIAGNOSIS — N63 Unspecified lump in unspecified breast: Secondary | ICD-10-CM | POA: Diagnosis not present

## 2022-07-30 DIAGNOSIS — Z170 Estrogen receptor positive status [ER+]: Secondary | ICD-10-CM | POA: Insufficient documentation

## 2022-07-30 DIAGNOSIS — K219 Gastro-esophageal reflux disease without esophagitis: Secondary | ICD-10-CM | POA: Insufficient documentation

## 2022-07-30 DIAGNOSIS — Z87891 Personal history of nicotine dependence: Secondary | ICD-10-CM | POA: Insufficient documentation

## 2022-07-30 DIAGNOSIS — I10 Essential (primary) hypertension: Secondary | ICD-10-CM | POA: Diagnosis not present

## 2022-07-30 DIAGNOSIS — Z803 Family history of malignant neoplasm of breast: Secondary | ICD-10-CM | POA: Diagnosis not present

## 2022-07-30 DIAGNOSIS — J45909 Unspecified asthma, uncomplicated: Secondary | ICD-10-CM | POA: Insufficient documentation

## 2022-07-30 DIAGNOSIS — C50411 Malignant neoplasm of upper-outer quadrant of right female breast: Secondary | ICD-10-CM | POA: Insufficient documentation

## 2022-07-30 DIAGNOSIS — E119 Type 2 diabetes mellitus without complications: Secondary | ICD-10-CM | POA: Insufficient documentation

## 2022-07-30 DIAGNOSIS — Z95828 Presence of other vascular implants and grafts: Secondary | ICD-10-CM

## 2022-07-30 DIAGNOSIS — Z9221 Personal history of antineoplastic chemotherapy: Secondary | ICD-10-CM | POA: Diagnosis not present

## 2022-07-30 DIAGNOSIS — Z5111 Encounter for antineoplastic chemotherapy: Secondary | ICD-10-CM | POA: Diagnosis present

## 2022-07-30 DIAGNOSIS — Z923 Personal history of irradiation: Secondary | ICD-10-CM | POA: Diagnosis not present

## 2022-07-30 DIAGNOSIS — N6489 Other specified disorders of breast: Secondary | ICD-10-CM | POA: Diagnosis not present

## 2022-07-30 DIAGNOSIS — Z79811 Long term (current) use of aromatase inhibitors: Secondary | ICD-10-CM | POA: Diagnosis not present

## 2022-07-30 MED ORDER — GOSERELIN ACETATE 3.6 MG ~~LOC~~ IMPL
3.6000 mg | DRUG_IMPLANT | Freq: Once | SUBCUTANEOUS | Status: AC
  Administered 2022-07-30: 3.6 mg via SUBCUTANEOUS
  Filled 2022-07-30: qty 3.6

## 2022-07-30 NOTE — Progress Notes (Signed)
MM/US/Breast Aspiration orders faxed to [facility] at [phone] with receipt confirmation. Called Aquia Harbour with [facility] to schedule. Pt scheduled for 08/03/22 at 0915, Shanda Bumps verbalized she will notify Pt.

## 2022-07-30 NOTE — Progress Notes (Signed)
[facility] Cancer Follow up:    Heidi Peng, MD [address] [place] [address]   DIAGNOSIS:  Cancer Staging  Malignant neoplasm of upper-outer quadrant of right breast in female, estrogen receptor positive (HCC) Staging form: Breast, AJCC 8th Edition - Clinical stage from 07/04/2019: Stage IIB (cT2, cN1(f), cM0, G3, ER+, PR-, HER2+) - Signed by Serena Croissant, MD on 07/05/2019 Stage prefix: Initial diagnosis Method of lymph node assessment: Core biopsy Histologic grading system: 3 grade system   SUMMARY OF ONCOLOGIC HISTORY: Oncology History  Malignant neoplasm of upper-outer quadrant of right breast in female, estrogen receptor positive (HCC)  06/21/2019 Initial Diagnosis   Patient palpated a painful right breast lump x3 weeks. Mammogram showed a 2.0cm right breast mass at the 10 o'clock position, a 3.6cm enlarged right axillary lymph node, and a 0.5cm left breast mass. Biopsy showed, in the right breast and axilla, IDC, grade 3, HER-2 equivocal by IHC, + by FISH, ER+ 40%, PR - 0%, Ki67 80%, and in the left breast, fibrocystic changes, no malignancy.    07/04/2019 Cancer Staging   Staging form: Breast, AJCC 8th Edition - Clinical stage from 07/04/2019: Stage IIB (cT2, cN1(f), cM0, G3, ER+, PR-, HER2+) - Signed by Serena Croissant, MD on 07/05/2019   07/14/2019 - 10/07/2019 Chemotherapy   The patient had dexamethasone (DECADRON) 4 MG tablet, 4 mg (100 % of original dose 4 mg), Oral, Daily, 1 of 1 cycle, Start date: 07/05/2019, End date: 08/04/2019 Dose modification: 4 mg (original dose 4 mg, Cycle 0) palonosetron (ALOXI) injection 0.25 mg, 0.25 mg, Intravenous,  Once, 5 of 5 cycles Administration: 0.25 mg (07/14/2019), 0.25 mg (08/04/2019), 0.25 mg (10/05/2019), 0.25 mg (08/25/2019), 0.25 mg (09/14/2019) pegfilgrastim-jmdb (FULPHILA) injection 6 mg, 6 mg, Subcutaneous,  Once, 5 of 5 cycles Administration: 6 mg (07/17/2019), 6 mg (08/07/2019), 6 mg (10/07/2019), 6 mg (08/29/2019), 6  mg (09/16/2019) CARBOplatin (PARAPLATIN) 700 mg in sodium chloride 0.9 % 250 mL chemo infusion, 700 mg (100 % of original dose 700 mg), Intravenous,  Once, 5 of 5 cycles Dose modification: 700 mg (original dose 700 mg, Cycle 1, Reason: Provider Judgment), 600 mg (original dose 700 mg, Cycle 2, Reason: Dose not tolerated) Administration: 700 mg (07/14/2019), 600 mg (08/04/2019), 600 mg (10/05/2019), 600 mg (08/25/2019), 600 mg (09/14/2019) DOCEtaxel (TAXOTERE) 170 mg in sodium chloride 0.9 % 250 mL chemo infusion, 75 mg/m2 = 170 mg, Intravenous,  Once, 5 of 5 cycles Dose modification: 65 mg/m2 (original dose 75 mg/m2, Cycle 2, Reason: Dose not tolerated) Administration: 170 mg (07/14/2019), 150 mg (08/04/2019), 150 mg (10/05/2019), 150 mg (08/25/2019), 150 mg (09/14/2019) fosaprepitant (EMEND) 150 mg in sodium chloride 0.9 % 145 mL IVPB, 150 mg, Intravenous,  Once, 5 of 5 cycles Administration: 150 mg (07/14/2019), 150 mg (08/04/2019), 150 mg (10/05/2019), 150 mg (08/25/2019), 150 mg (09/14/2019) pertuzumab (PERJETA) 840 mg in sodium chloride 0.9 % 250 mL chemo infusion, 840 mg, Intravenous, Once, 5 of 5 cycles Administration: 840 mg (07/14/2019), 420 mg (08/04/2019), 420 mg (10/05/2019), 420 mg (08/25/2019), 420 mg (09/14/2019) trastuzumab-dkst (OGIVRI) 900 mg in sodium chloride 0.9 % 250 mL chemo infusion, 903 mg, Intravenous,  Once, 5 of 5 cycles Administration: 900 mg (07/14/2019), 672 mg (08/04/2019), 672 mg (10/05/2019), 672 mg (08/25/2019), 672 mg (09/14/2019)  for chemotherapy treatment.    07/22/2019 Genetic Testing   Negative genetic testing:  No pathogenic variants detected on the [employer] Common Hereditary Cancers Panel. The report date is 07/22/2019.  The Common  Hereditary Cancers Panel offered by [facility] includes sequencing and/or deletion duplication testing of the following 48 genes: APC, ATM, AXIN2, BARD1, BMPR1A, BRCA1, BRCA2, BRIP1, CDH1, CDK4, CDKN2A (p14ARF), CDKN2A (p16INK4a), CHEK2, CTNNA1, DICER1, EPCAM  (Deletion/duplication testing only), GREM1 (promoter region deletion/duplication testing only), KIT, MEN1, MLH1, MSH2, MSH3, MSH6, MUTYH, NBN, NF1, NHTL1, PALB2, PDGFRA, PMS2, POLD1, POLE, PTEN, RAD50, RAD51C, RAD51D, RNF43, SDHB, SDHC, SDHD, SMAD4, SMARCA4. STK11, TP53, TSC1, TSC2, and VHL.  The following genes were evaluated for sequence changes only: SDHA and HOXB13 c.251G>A variant only.    11/20/2019 - 07/18/2020 Chemotherapy   [facility]       12/13/2019 Surgery   Right lumpectomy Dwain Sarna): IDC, 5 right axillary lymph nodes negative for carcinoma.    01/22/2020 - 03/12/2020 Radiation Therapy   Site/dose:   The patient initially received a dose of 50.4 Gy in 28 fractions to the breast using whole-breast tangent fields. This was delivered using a 3-D conformal technique. The patient then received a boost to the seroma. This delivered an additional 10 Gy in 5 fractions using a 3-field photon boost technique. The total dose was 60.4 Gy.     03/30/2020 -  Anti-estrogen oral therapy   Anastrozole switched to Letrozole Nov 2022     CURRENT THERAPY: letrozole and Zoladex  INTERVAL HISTORY: Heidi Costa 42 y.o. female returns for follow-up of her history of breast cancer over concerns of a new right breast lower inner quadrant nodule that she noticed a few days ago.  He also has a known seroma that is very uncomfortable for her.  Tells me that she continues on the letrozole and Zoladex with good tolerance.  Her most recent mammogram occurred on November 18, 2021 demonstrating a 6.7 cm seroma in the right breast at 9-12 o'clock.  No sonographic evidence of malignancy and repeat bilateral diagnostic mammogram was recommended in 12 months time.   Patient Active Problem List   Diagnosis Date Noted   Diabetes mellitus due to underlying condition with ketoacidosis without coma, with long-term current use of insulin (HCC) 03/08/2022   Mixed hyperlipidemia 03/08/2022   Vitamin D deficiency disease  03/08/2022   Acute bilateral low back pain without sciatica 03/08/2022   Type 2 diabetes mellitus without complication, without long-term current use of insulin (HCC) 09/23/2021   Dyslipidemia 09/23/2021   DKA (diabetic ketoacidosis) (HCC) 03/22/2021   Intractable nausea and vomiting 03/22/2021   AKI (acute kidney injury) (HCC) 03/22/2021   Breast cancer (HCC) 03/22/2021   Thrombocytosis 03/22/2021   DKA, type 2 (HCC) 03/22/2021   Mild persistent asthma without complication 10/17/2019   Port-A-Cath in place 08/04/2019   Genetic testing 07/24/2019   Family history of cancer    Dysphagia 06/23/2019   Malignant neoplasm of upper-outer quadrant of right breast in female, estrogen receptor positive (HCC) 06/21/2019   Tobacco consumption 06/06/2019   Breast mass, right 06/06/2019   Sensation of change in body temperature 06/06/2019   Neuropathic pain 06/06/2019   Bite, insect 10/19/2016   Swelling of eyelid, left 10/19/2016   Essential hypertension 08/10/2016   Disturbance of skin sensation 05/08/2013   Cervical spondylosis with myelopathy 05/08/2013   Abnormality of gait 08/02/2012   Chronic arthralgias of knees and hips 08/02/2012    is allergic to aspirin.  MEDICAL HISTORY: Past Medical History:  Diagnosis Date   Abnormality of gait 08/02/2012   resolved per patient on 07/12/19   Anemia    iron taking in the past, not currently   Anxiety  Arthritis    spine- cerv.  & arm    Asthma    Bulging lumbar disc    Cancer (HCC)    Right Breast-starts chemo on 07/14/19   Chronic arthralgias of knees and hips 08/02/2012   Depression    Pt. took self off Celexa one month ago, states she did n't like how it made her feel .   Family history of cancer    GERD (gastroesophageal reflux disease)    diet controlled   Heart murmur    pt. been told that this is a fact, no echo or cardiac surveillance in the past    HLD (hyperlipidemia)    no meds, diet controlled   Hypertension     MRSA (methicillin resistant Staphylococcus aureus) 03/02/2005   Obesity    Spinal stenosis     SURGICAL HISTORY: Past Surgical History:  Procedure Laterality Date   ANTERIOR CERVICAL DECOMP/DISCECTOMY FUSION N/A 10/27/2012   Procedure: ANTERIOR CERVICAL DECOMPRESSION/DISCECTOMY FUSION 1 LEVEL;  Surgeon: Reinaldo Meeker, MD;  Location: [facility];  Service: Neurosurgery;  Laterality: N/A;  ANTERIOR CERVICAL DECOMPRESSION/DISCECTOMY FUSION 1 LEVEL   BREAST LUMPECTOMY WITH RADIOACTIVE SEED AND SENTINEL LYMPH NODE BIOPSY Right 12/13/2019   Procedure: RIGHT BREAST LUMPECTOMY WITH RADIOACTIVE SEED AND RIGHT AXILLARY  SENTINEL LYMPH NODE BIOPSY, RIGHT AXILLARY NODE SEED GUIDED EXCISION, BLUE DYE INJECTION;  Surgeon: Emelia Loron, MD;  Location: [facility];  Service: General;  Laterality: Right;  PEC BLOCK   BREAST REDUCTION SURGERY Bilateral 03/04/2021   Procedure: MAMMARY REDUCTION  (BREAST);  Surgeon: Contogiannis, Chales Abrahams, MD;  Location: [facility];  Service: Plastics;  Laterality: Bilateral;   CHALAZION EXCISION     L eye    PORTACATH PLACEMENT Left 07/13/2019   Procedure: INSERTION PORT-A-CATH WITH ULTRASOUND;  Surgeon: Emelia Loron, MD;  Location: [facility];  Service: General;  Laterality: Left;   VAGINAL DELIVERY  2002   WISDOM TOOTH EXTRACTION      SOCIAL HISTORY: Social History   Socioeconomic History   Marital status: Married    Spouse name: Not on file   Number of children: 1   Years of education: associates   Highest education level: Not on file  Occupational History    Employer: OTHER  Tobacco Use   Smoking status: Former    Packs/day: 1.00    Years: 10.00    Additional pack years: 0.00    Total pack years: 10.00    Types: Cigarettes    Quit date: 06/24/2020    Years since quitting: 2.0   Smokeless tobacco: Never  Vaping Use   Vaping Use: Never used  Substance and Sexual Activity   Alcohol use: Yes    Comment: occas   Drug use:  Not Currently    Types: Marijuana    Comment: uses daily - last use 07/12/19   Sexual activity: Yes    Birth control/protection: None  Other Topics Concern   Not on file  Social History Narrative   Patient is married with one child.   Patient is right handed.   Patient has a Associate's degree.   Patient drinks 2-3 cups daily.   Social Determinants of Health   Financial Resource Strain: Not on file  Food Insecurity: Not on file  Transportation Needs: Not on file  Physical Activity: Not on file  Stress: Not on file  Social Connections: Not on file  Intimate Partner Violence: Not on file    FAMILY HISTORY: Family History  Problem Relation Age of Onset   Osteoarthritis Mother    Hypertension Mother    Diabetes Mother    Hypertension Father    Gout Father    Stroke Father 15       embolic   Diabetes Half-Brother    Hypertension Half-Brother    Heart failure Half-Brother 40   Kidney disease Half-Brother    Diabetes Paternal Grandfather    Cancer Paternal Grandfather 3       breast cancer    Review of Systems  Constitutional:  Negative for appetite change, chills, fatigue, fever and unexpected weight change.  HENT:   Negative for hearing loss, lump/mass and trouble swallowing.   Eyes:  Negative for eye problems and icterus.  Respiratory:  Negative for chest tightness, cough and shortness of breath.   Cardiovascular:  Negative for chest pain, leg swelling and palpitations.  Gastrointestinal:  Negative for abdominal distention, abdominal pain, constipation, diarrhea, nausea and vomiting.  Endocrine: Negative for hot flashes.  Genitourinary:  Negative for difficulty urinating.   Musculoskeletal:  Negative for arthralgias.  Skin:  Negative for itching and rash.  Neurological:  Negative for dizziness, extremity weakness, headaches and numbness.  Hematological:  Negative for adenopathy. Does not bruise/bleed easily.  Psychiatric/Behavioral:  Negative for depression. The  patient is not nervous/anxious.       PHYSICAL EXAMINATION    Vitals:   07/30/22 1530  BP: 138/89  Pulse: 64  Resp: 18  Temp: (!) 97.3 F (36.3 C)  SpO2: 100%    Physical Exam Constitutional:      General: She is not in acute distress.    Appearance: Normal appearance. She is not toxic-appearing.  HENT:     Head: Normocephalic and atraumatic.  Eyes:     General: No scleral icterus. Cardiovascular:     Rate and Rhythm: Normal rate and regular rhythm.     Pulses: Normal pulses.     Heart sounds: Normal heart sounds.  Pulmonary:     Effort: Pulmonary effort is normal.     Breath sounds: Normal breath sounds.  Chest:     Comments: Right lower inner breast at about 4:00 4 cm from the nipple demonstrates a small 0.5 to 1 cm nodule that is soft.  She also has a right upper outer quadrant large seroma present. Abdominal:     General: Abdomen is flat. Bowel sounds are normal. There is no distension.     Palpations: Abdomen is soft.     Tenderness: There is no abdominal tenderness.  Musculoskeletal:        General: No swelling.     Cervical back: Neck supple.  Lymphadenopathy:     Cervical: No cervical adenopathy.  Skin:    General: Skin is warm and dry.     Findings: No rash.  Neurological:     General: No focal deficit present.     Mental Status: She is alert.  Psychiatric:        Mood and Affect: Mood normal.        Behavior: Behavior normal.     LABORATORY DATA:  None today  ASSESSMENT and THERAPY PLAN:   Malignant neoplasm of upper-outer quadrant of right breast in female, estrogen receptor positive (HCC) Heidi Costa is a 42 year old woman with stage IIb right breast ER positive, HER2 positive breast cancer diagnosed in March 2021 status post neoadjuvant chemotherapy, lumpectomy, adjuvant radiation, maintenance Herceptin Perjeta, and antiestrogen therapy with Zoladex and letrozole.   Treatment plan: 1. Neoadjuvant chemotherapy  with TCH Perjeta 5 cycles  started 07/12/2019 completed 10/05/2019 followed by Herceptin Perjeta completed 07/18/2020 2. breast conserving surgery with sentinel lymph node study: 12/13/2019: Pathologic complete response, 0/5 lymph nodes negative 3. Adjuvant radiation therapy 01/23/2020- 03/12/2020 4.  Followed by antiestrogen therapy with anastrozole 1 mg daily started 03/30/2020 ----------------------------------------------------------------------------------------------------------------------------------------------------------- Current treatment:  Letrozole with Zoladex  ER and HER2 positive breast cancer: She will continue on letrozole and Zoladex.  She is due for an injection today. New right breast nodule: I have placed orders for right breast diagnostic mammogram and ultrasound to further evaluate the area.  I have a low suspicion that it is cancerous. Right breast seroma: I also placed orders for a breast aspiration since this fluid in her breast is painful for her and worsening.  She will return every 4 weeks for her Zoladex injections.  Will see Dr. Pamelia Hoit in follow-up in November prior to her already scheduled Zoladex injection.    All questions were answered. The patient knows to call the clinic with any problems, questions or concerns. We can certainly see the patient much sooner if necessary.  Total encounter time:20 minutes*in face-to-face visit time, chart review, lab review, care coordination, order entry, and documentation of the encounter time.    Lillard Anes, NP 07/30/22 3:55 PM Medical Oncology and Hematology [facility] [address] [place], [place] [address] Tel. [phone]    Fax. [phone]  *Total Encounter Time as defined by the Centers for Medicare and Medicaid Services includes, in addition to the face-to-face time of a patient visit (documented in the note above) non-face-to-face time: obtaining and reviewing outside history, ordering and reviewing medications, tests  or procedures, care coordination (communications with other health care professionals or caregivers) and documentation in the medical record.

## 2022-07-30 NOTE — Assessment & Plan Note (Signed)
Heidi Costa is a 42 year old woman with stage IIb right breast ER positive, HER2 positive breast cancer diagnosed in March 2021 status post neoadjuvant chemotherapy, lumpectomy, adjuvant radiation, maintenance Herceptin Perjeta, and antiestrogen therapy with Zoladex and letrozole.   Treatment plan: 1. Neoadjuvant chemotherapy with TCH Perjeta 5 cycles started 07/12/2019 completed 10/05/2019 followed by Herceptin Perjeta completed 07/18/2020 2. breast conserving surgery with sentinel lymph node study: 12/13/2019: Pathologic complete response, 0/5 lymph nodes negative 3. Adjuvant radiation therapy 01/23/2020- 03/12/2020 4.  Followed by antiestrogen therapy with anastrozole 1 mg daily started 03/30/2020 ----------------------------------------------------------------------------------------------------------------------------------------------------------- Current treatment:  Letrozole with Zoladex  ER and HER2 positive breast cancer: She will continue on letrozole and Zoladex.  She is due for an injection today. New right breast nodule: I have placed orders for right breast diagnostic mammogram and ultrasound to further evaluate the area.  I have a low suspicion that it is cancerous. Right breast seroma: I also placed orders for a breast aspiration since this fluid in her breast is painful for her and worsening.  She will return every 4 weeks for her Zoladex injections.  Will see Dr. Pamelia Hoit in follow-up in November prior to her already scheduled Zoladex injection.

## 2022-07-31 ENCOUNTER — Telehealth: Payer: Self-pay | Admitting: Hematology and Oncology

## 2022-07-31 NOTE — Telephone Encounter (Signed)
Scheduled appointment per los. Left voicemail. 

## 2022-08-01 ENCOUNTER — Other Ambulatory Visit: Payer: Self-pay | Admitting: Internal Medicine

## 2022-08-01 DIAGNOSIS — E559 Vitamin D deficiency, unspecified: Secondary | ICD-10-CM

## 2022-08-04 ENCOUNTER — Telehealth: Payer: Self-pay | Admitting: *Deleted

## 2022-08-04 NOTE — Telephone Encounter (Signed)
Received [facility] report showing recent breast US reveled a lobulated mass in right breast.  Pt schedule for ultrasound guided biopsy.  RN scheduled pt MD f/u after biopsy to review results.  RN attempt x1 to contact pt, no answer.  LVM with appt details.

## 2022-08-04 NOTE — Progress Notes (Signed)
Name: Heidi Costa  MRN/ DOB: [id number], 1980/12/19   Age/ Sex: 42 y.o., female    PCP: Heidi Peng, MD   Reason for Endocrinology Evaluation: Type 2 Diabetes Mellitus     Date of Initial Endocrinology Visit: 09/23/2021    PATIENT IDENTIFIER: [name] is a 42 y.o. female with a past medical history of Hx of breast ca ( S/P lumpectomy 2021) and chemo. The patient presented for initial endocrinology clinic visit on 09/23/2021 for consultative assistance with her diabetes management.    HPI: [name] was    Diagnosed with DM 02/2021 when she presented to the ED with symptomatic hyperglycemia.  She was noted to be in DKA, with an elevated anion gap at 25 and elevated  beta hydroxybutyrate> mmol/L. She was initially discharged on insulin and Metformin .  Prior Medications tried/Intolerance: Metformin, Janumet Hemoglobin A1c has ranged from 6.1% in 2023, peaking at 10.5% in 2022.  On her initial visit to our clinic she had an A1c of 6.1%, we continued [place] and Ozempic     SUBJECTIVE:   During the last visit (02/03/2022): A1c 6.2%     Today (08/07/22): [name] is here for follow-up on diabetes management.  She checks her blood sugars 1 times daily. The patient has not had hypoglycemic episodes since the last clinic visit.  She was seen by Oncology due to concern regarding a new right breast nodule, continues on letrozole and Zoladex  She has lost 16 lbs over the past 6 month Denies nausea, vomiting or diarrhea    HOME DIABETES REGIMEN: Farxiga 10 mg Ozempic 1 mg weekly   Statin: Yes ACE-I/ARB: no Prior Diabetic Education:   METER DOWNLOAD SUMMARY: unable to download  Range 98 144 mg/dL    DIABETIC COMPLICATIONS: Microvascular complications:   Denies: CKD Last eye exam: Completed 03/2022  Macrovascular complications:   Denies: CAD, PVD, CVA   PAST HISTORY: Past Medical History:  Past Medical History:  Diagnosis Date   Abnormality  of gait 08/02/2012   resolved per patient on 07/12/19   Anemia    iron taking in the past, not currently   Anxiety    Arthritis    spine- cerv.  & arm    Asthma    Bulging lumbar disc    Cancer (HCC)    Right Breast-starts chemo on 07/14/19   Chronic arthralgias of knees and hips 08/02/2012   Depression    Pt. took self off Celexa one month ago, states she did n't like how it made her feel .   Family history of cancer    GERD (gastroesophageal reflux disease)    diet controlled   Heart murmur    pt. been told that this is a fact, no echo or cardiac surveillance in the past    HLD (hyperlipidemia)    no meds, diet controlled   Hypertension    MRSA (methicillin resistant Staphylococcus aureus) 03/02/2005   Obesity    Spinal stenosis    Past Surgical History:  Past Surgical History:  Procedure Laterality Date   ANTERIOR CERVICAL DECOMP/DISCECTOMY FUSION N/A 10/27/2012   Procedure: ANTERIOR CERVICAL DECOMPRESSION/DISCECTOMY FUSION 1 LEVEL;  Surgeon: Reinaldo Meeker, MD;  Location: [facility];  Service: Neurosurgery;  Laterality: N/A;  ANTERIOR CERVICAL DECOMPRESSION/DISCECTOMY FUSION 1 LEVEL   BREAST LUMPECTOMY WITH RADIOACTIVE SEED AND SENTINEL LYMPH NODE BIOPSY Right 12/13/2019   Procedure: RIGHT BREAST LUMPECTOMY WITH RADIOACTIVE SEED AND RIGHT AXILLARY  SENTINEL LYMPH NODE BIOPSY, RIGHT  AXILLARY NODE SEED GUIDED EXCISION, BLUE DYE INJECTION;  Surgeon: Emelia Loron, MD;  Location: [facility];  Service: General;  Laterality: Right;  PEC BLOCK   BREAST REDUCTION SURGERY Bilateral 03/04/2021   Procedure: MAMMARY REDUCTION  (BREAST);  Surgeon: Contogiannis, Chales Abrahams, MD;  Location: [facility];  Service: Plastics;  Laterality: Bilateral;   CHALAZION EXCISION     L eye    PORTACATH PLACEMENT Left 07/13/2019   Procedure: INSERTION PORT-A-CATH WITH ULTRASOUND;  Surgeon: Emelia Loron, MD;  Location: [facility];  Service: General;  Laterality: Left;    VAGINAL DELIVERY  2002   WISDOM TOOTH EXTRACTION      Social History:  reports that she quit smoking about 2 years ago. Her smoking use included cigarettes. She has a 10.00 pack-year smoking history. She has never used smokeless tobacco. She reports current alcohol use. She reports that she does not currently use drugs after having used the following drugs: Marijuana. Family History:  Family History  Problem Relation Age of Onset   Osteoarthritis Mother    Hypertension Mother    Diabetes Mother    Hypertension Father    Gout Father    Stroke Father 88       embolic   Diabetes Half-Brother    Hypertension Half-Brother    Heart failure Half-Brother 56   Kidney disease Half-Brother    Diabetes Paternal Grandfather    Cancer Paternal Grandfather 73       breast cancer     HOME MEDICATIONS: Allergies as of 08/07/2022       Reactions   Aspirin Rash        Medication List        Accurate as of Aug 07, 2022  3:19 PM. If you have any questions, ask your nurse or doctor.          STOP taking these medications    cyclobenzaprine 5 MG tablet Commonly known as: FLEXERIL Stopped by: Scarlette Shorts, MD   nitrofurantoin (macrocrystal-monohydrate) 100 MG capsule Commonly known as: MACROBID Stopped by: Scarlette Shorts, MD       TAKE these medications    Accu-Chek Guide w/Device Kit Use to check blood sugar 2 times daily   acetaminophen 500 MG tablet Commonly known as: TYLENOL Take 500-1,000 mg by mouth daily as needed for pain.   albuterol 108 (90 Base) MCG/ACT inhaler Commonly known as: VENTOLIN HFA Inhale 2 puffs into the lungs every 6 (six) hours as needed for wheezing or shortness of breath.   atorvastatin 10 MG tablet Commonly known as: LIPITOR Take 1 tablet by mouth once daily   baclofen 10 MG tablet Commonly known as: LIORESAL TAKE 1 TABLET BY MOUTH THREE TIMES DAILY   blood glucose meter kit and supplies Kit Dispense based on patient and  insurance preference. Use up to four times daily as directed. (FOR ICD-137). For QAC - HS accuchecks.  Dispense any approved glucometer with 1 month testing supplies.   buPROPion 150 MG 24 hr tablet Commonly known as: WELLBUTRIN XL TAKE 1 TABLET BY MOUTH ONCE DAILY IN THE MORNING   carvedilol 6.25 MG tablet Commonly known as: COREG TAKE 1 TABLET BY MOUTH TWICE DAILY WITH A MEAL   Farxiga 10 MG Tabs tablet Generic drug: dapagliflozin propanediol Take 10 mg by mouth daily.   glucose blood test strip Use as instructed   letrozole 2.5 MG tablet Commonly known as: FEMARA Take 1 tablet (2.5 mg total) by mouth daily.  Ozempic (1 MG/DOSE) 4 MG/3ML Sopn Generic drug: Semaglutide (1 MG/DOSE) INJECT 1 MG INTO THE SKIN ONCE A WEEK   Vitamin D (Ergocalciferol) 1.25 MG (50000 UNIT) Caps capsule Commonly known as: DRISDOL Take 1 capsule by mouth once a week         ALLERGIES: Allergies  Allergen Reactions   Aspirin Rash     REVIEW OF SYSTEMS: A comprehensive ROS was conducted with the patient and is negative except as per HPI    OBJECTIVE:   VITAL SIGNS: Ht 5\' 5"  (1.651 m)   Wt 208 lb (94.3 kg)   LMP  (LMP Unknown) Comment: June 2022  BMI 34.61 kg/m    PHYSICAL EXAM:  General: Pt appears well and is in NAD  Neck: General: Supple without adenopathy or carotid bruits. Thyroid: Thyroid size normal.  No goiter or nodules appreciated.  Lungs: Clear with good BS bilat with no rales, rhonchi, or wheezes  Heart: RRR   Abdomen: Normoactive bowel sounds, soft, nontender, without masses or organomegaly palpable  Extremities:  Lower extremities - No pretibial edema. No lesions.  Neuro: MS is good with appropriate affect, pt is alert and Ox3    DM foot exam: 08/07/2022  The skin of the feet is intact without sores or ulcerations. The pedal pulses are 2+ on right and 2+ on left. The sensation is intact to a screening 5.07, 10 gram monofilament bilaterally   DATA  REVIEWED:  Lab Results  Component Value Date   HGBA1C 6.0 (H) 05/28/2022   HGBA1C 6.0 (H) 02/25/2022   HGBA1C 6.2 (H) 01/07/2022    Latest Reference Range & Units 05/28/22 16:27  Sodium 134 - 144 mmol/L 145 (H)  Potassium 3.5 - 5.2 mmol/L 4.2  Chloride 96 - 106 mmol/L 106  CO2 20 - 29 mmol/L 25  Glucose 70 - 99 mg/dL 98  BUN 6 - 24 mg/dL 10  Creatinine 1.61 - 0.96 mg/dL 0.45  Calcium 8.7 - 40.9 mg/dL 9.4  BUN/Creatinine Ratio 9 - 23  13  eGFR >59 mL/min/1.73 97  (H): Data is abnormally high  ASSESSMENT / PLAN / RECOMMENDATIONS:   1) Type 2 Diabetes Mellitus, Optimally controlled, Without complications - Most recent A1c of 5.7 %. Goal A1c < 7.0 %.    - A1c remains at goal  - She has lost 16 lbs since 01/2022 and is concerned , we opted to remain on current dose of Ozempic  - NO changes     MEDICATIONS: Continue Farxiga 10 mg daily Continue Ozempic 1 mg weekly  EDUCATION / INSTRUCTIONS: BG monitoring instructions: Patient is instructed to check her blood sugars 1 times a day, fasting. Call [facility] if: BG persistently < 70  I reviewed the Rule of 15 for the treatment of hypoglycemia in detail with the patient. Literature supplied.   2) Diabetic complications:  Eye: Does not have known diabetic retinopathy.  Neuro/ Feet: Does not have known diabetic peripheral neuropathy. Renal: Patient does not have known baseline CKD. She is not on an ACEI/ARB at present.     Follow-up in 6 months   Signed electronically by: Lyndle Herrlich, MD  [facility] Endocrinology  [facility] [address] [place] [place], [place] [address] Phone: [phone] FAX: [phone]   CC: Heidi Peng, MD [address] [place] [address] Phone: [phone]  Fax: [phone]    Return to Endocrinology clinic as below: Future Appointments  Date Time Provider Department Center  08/12/2022  3:00 PM Candelaria Stagers, DPM  [facility]  08/13/2022  9:45 AM Serena Croissant, MD [facility] None  08/27/2022  3:45 PM [facility] None  09/03/2022  4:00 PM Heidi Peng, MD [facility] None  09/24/2022  3:45 PM [facility] None  10/22/2022  3:45 PM [facility] None  11/19/2022  3:45 PM [facility] None  12/17/2022  3:45 PM [facility] None  01/14/2023  3:45 PM [facility] None  02/11/2023  2:30 PM Serena Croissant, MD [facility] None  02/11/2023  3:15 PM [facility] None  03/04/2023  2:00 PM Heidi Peng, MD [facility] None

## 2022-08-05 ENCOUNTER — Encounter: Payer: Self-pay | Admitting: Hematology and Oncology

## 2022-08-07 ENCOUNTER — Other Ambulatory Visit: Payer: Self-pay

## 2022-08-07 ENCOUNTER — Encounter: Payer: Self-pay | Admitting: Hematology and Oncology

## 2022-08-07 ENCOUNTER — Ambulatory Visit (INDEPENDENT_AMBULATORY_CARE_PROVIDER_SITE_OTHER): Payer: 59 | Admitting: Internal Medicine

## 2022-08-07 ENCOUNTER — Encounter: Payer: Self-pay | Admitting: Internal Medicine

## 2022-08-07 VITALS — Ht 65.0 in | Wt 208.0 lb

## 2022-08-07 DIAGNOSIS — E119 Type 2 diabetes mellitus without complications: Secondary | ICD-10-CM

## 2022-08-07 DIAGNOSIS — Z7985 Long-term (current) use of injectable non-insulin antidiabetic drugs: Secondary | ICD-10-CM

## 2022-08-07 DIAGNOSIS — Z7984 Long term (current) use of oral hypoglycemic drugs: Secondary | ICD-10-CM | POA: Diagnosis not present

## 2022-08-07 LAB — POCT GLYCOSYLATED HEMOGLOBIN (HGB A1C): Hemoglobin A1C: 5.7 % — AB (ref 4.0–5.6)

## 2022-08-07 MED ORDER — DAPAGLIFLOZIN PROPANEDIOL 10 MG PO TABS: 10.0000 mg | ORAL_TABLET | Freq: Every day | ORAL | 3 refills | Status: DC

## 2022-08-07 MED ORDER — OZEMPIC (1 MG/DOSE) 4 MG/3ML ~~LOC~~ SOPN: 1.0000 mg | PEN_INJECTOR | SUBCUTANEOUS | 3 refills | Status: DC

## 2022-08-07 NOTE — Patient Instructions (Signed)
Continue Farxiga 10 mg daily  Continue Ozempic 1 mg weekly      HOW TO TREAT LOW BLOOD SUGARS (Blood sugar LESS THAN 70 MG/DL) Please follow the RULE OF 15 for the treatment of hypoglycemia treatment (when your (blood sugars are less than 70 mg/dL)   STEP 1: Take 15 grams of carbohydrates when your blood sugar is low, which includes:  3-4 GLUCOSE TABS  OR 3-4 OZ OF JUICE OR REGULAR SODA OR ONE TUBE OF GLUCOSE GEL    STEP 2: RECHECK blood sugar in 15 MINUTES STEP 3: If your blood sugar is still low at the 15 minute recheck --> then, go back to STEP 1 and treat AGAIN with another 15 grams of carbohydrates. 

## 2022-08-11 ENCOUNTER — Encounter: Payer: Self-pay | Admitting: Hematology and Oncology

## 2022-08-11 ENCOUNTER — Ambulatory Visit: Payer: 59 | Admitting: Podiatry

## 2022-08-11 NOTE — Progress Notes (Signed)
HEMATOLOGY-ONCOLOGY TELEPHONE VISIT PROGRESS NOTE  I connected with our patient on 08/13/22 at  9:45 AM EDT by telephone and verified that I am speaking with the correct person using two identifiers.  I discussed the limitations, risks, security and privacy concerns of performing an evaluation and management service by telephone and the availability of in person appointments.  I also discussed with the patient that there may be a patient responsible charge related to this service. The patient expressed understanding and agreed to proceed.   History of Present Illness: Heidi Costa is a 42 y.o. with above-mentioned history of HER-2 positive breast cancer. She presents to the clinic for a telephone follow-up.  She had a mammogram follow-up that revealed an abnormality in the right breast which was biopsied under ultrasound guidance and she is here today to discuss results by telephone.   Oncology History  Malignant neoplasm of upper-outer quadrant of right breast in female, estrogen receptor positive (HCC)  06/21/2019 Initial Diagnosis   Patient palpated a painful right breast lump x3 weeks. Mammogram showed a 2.0cm right breast mass at the 10 o'clock position, a 3.6cm enlarged right axillary lymph node, and a 0.5cm left breast mass. Biopsy showed, in the right breast and axilla, IDC, grade 3, HER-2 equivocal by IHC, + by FISH, ER+ 40%, PR - 0%, Ki67 80%, and in the left breast, fibrocystic changes, no malignancy.    07/04/2019 Cancer Staging   Staging form: Breast, AJCC 8th Edition - Clinical stage from 07/04/2019: Stage IIB (cT2, cN1(f), cM0, G3, ER+, PR-, HER2+) - Signed by Serena Croissant, MD on 07/05/2019   07/14/2019 - 10/07/2019 Chemotherapy   The patient had dexamethasone (DECADRON) 4 MG tablet, 4 mg (100 % of original dose 4 mg), Oral, Daily, 1 of 1 cycle, Start date: 07/05/2019, End date: 08/04/2019 Dose modification: 4 mg (original dose 4 mg, Cycle 0) palonosetron (ALOXI) injection 0.25 mg, 0.25  mg, Intravenous,  Once, 5 of 5 cycles Administration: 0.25 mg (07/14/2019), 0.25 mg (08/04/2019), 0.25 mg (10/05/2019), 0.25 mg (08/25/2019), 0.25 mg (09/14/2019) pegfilgrastim-jmdb (FULPHILA) injection 6 mg, 6 mg, Subcutaneous,  Once, 5 of 5 cycles Administration: 6 mg (07/17/2019), 6 mg (08/07/2019), 6 mg (10/07/2019), 6 mg (08/29/2019), 6 mg (09/16/2019) CARBOplatin (PARAPLATIN) 700 mg in sodium chloride 0.9 % 250 mL chemo infusion, 700 mg (100 % of original dose 700 mg), Intravenous,  Once, 5 of 5 cycles Dose modification: 700 mg (original dose 700 mg, Cycle 1, Reason: Provider Judgment), 600 mg (original dose 700 mg, Cycle 2, Reason: Dose not tolerated) Administration: 700 mg (07/14/2019), 600 mg (08/04/2019), 600 mg (10/05/2019), 600 mg (08/25/2019), 600 mg (09/14/2019) DOCEtaxel (TAXOTERE) 170 mg in sodium chloride 0.9 % 250 mL chemo infusion, 75 mg/m2 = 170 mg, Intravenous,  Once, 5 of 5 cycles Dose modification: 65 mg/m2 (original dose 75 mg/m2, Cycle 2, Reason: Dose not tolerated) Administration: 170 mg (07/14/2019), 150 mg (08/04/2019), 150 mg (10/05/2019), 150 mg (08/25/2019), 150 mg (09/14/2019) fosaprepitant (EMEND) 150 mg in sodium chloride 0.9 % 145 mL IVPB, 150 mg, Intravenous,  Once, 5 of 5 cycles Administration: 150 mg (07/14/2019), 150 mg (08/04/2019), 150 mg (10/05/2019), 150 mg (08/25/2019), 150 mg (09/14/2019) pertuzumab (PERJETA) 840 mg in sodium chloride 0.9 % 250 mL chemo infusion, 840 mg, Intravenous, Once, 5 of 5 cycles Administration: 840 mg (07/14/2019), 420 mg (08/04/2019), 420 mg (10/05/2019), 420 mg (08/25/2019), 420 mg (09/14/2019) trastuzumab-dkst (OGIVRI) 900 mg in sodium chloride 0.9 % 250 mL chemo infusion, 903 mg, Intravenous,  Once,  5 of 5 cycles Administration: 900 mg (07/14/2019), 672 mg (08/04/2019), 672 mg (10/05/2019), 672 mg (08/25/2019), 672 mg (09/14/2019)  for chemotherapy treatment.    07/22/2019 Genetic Testing   Negative genetic testing:  No pathogenic variants detected on the [facility] Common  Hereditary Cancers Panel. The report date is 07/22/2019.  The Common Hereditary Cancers Panel offered by [facility] includes sequencing and/or deletion duplication testing of the following 48 genes: APC, ATM, AXIN2, BARD1, BMPR1A, BRCA1, BRCA2, BRIP1, CDH1, CDK4, CDKN2A (p14ARF), CDKN2A (p16INK4a), CHEK2, CTNNA1, DICER1, EPCAM (Deletion/duplication testing only), GREM1 (promoter region deletion/duplication testing only), KIT, MEN1, MLH1, MSH2, MSH3, MSH6, MUTYH, NBN, NF1, NHTL1, PALB2, PDGFRA, PMS2, POLD1, POLE, PTEN, RAD50, RAD51C, RAD51D, RNF43, SDHB, SDHC, SDHD, SMAD4, SMARCA4. STK11, TP53, TSC1, TSC2, and VHL.  The following genes were evaluated for sequence changes only: SDHA and HOXB13 c.251G>A variant only.    11/20/2019 - 07/18/2020 Chemotherapy   [facility]P       12/13/2019 Surgery   Right lumpectomy Dwain Sarna): IDC, 5 right axillary lymph nodes negative for carcinoma.    01/22/2020 - 03/12/2020 Radiation Therapy   Site/dose:   The patient initially received a dose of 50.4 Gy in 28 fractions to the breast using whole-breast tangent fields. This was delivered using a 3-D conformal technique. The patient then received a boost to the seroma. This delivered an additional 10 Gy in 5 fractions using a 3-field photon boost technique. The total dose was 60.4 Gy.     03/30/2020 -  Anti-estrogen oral therapy   Anastrozole switched to Letrozole Nov 2022     REVIEW OF SYSTEMS:   Constitutional: Denies fevers, chills or abnormal weight loss All other systems were reviewed with the patient and are negative. Observations/Objective:     Assessment Plan:  Malignant neoplasm of upper-outer quadrant of right breast in female, estrogen receptor positive (HCC) stage IIb right breast ER positive, HER2 positive breast cancer diagnosed in March 2021 status post neoadjuvant chemotherapy, lumpectomy, adjuvant radiation, maintenance Herceptin Perjeta, and antiestrogen therapy with Zoladex and letrozole.   Treatment  plan: 1. Neoadjuvant chemotherapy with TCH Perjeta 5 cycles started 07/12/2019 completed 10/05/2019 followed by Herceptin Perjeta completed 07/18/2020 2. breast conserving surgery with sentinel lymph node study: 12/13/2019: Pathologic complete response, 0/5 lymph nodes negative 3. Adjuvant radiation therapy 01/23/2020- 03/12/2020 4.  Followed by antiestrogen therapy with anastrozole 1 mg daily started 03/30/2020 ----------------------------------------------------------------------------------------------------------------------------------------------------------- Current treatment:  Letrozole with Zoladex   ER and HER2 positive breast cancer: She will continue on letrozole and Zoladex.  She is due for an injection today. New right breast nodule: Mammogram and ultrasound 08/03/2022: Benign (seroma measuring 6 cm: Aspiration was attempted but no fluid obtained secondary to viscosity).  Biopsy revealed fat necrosis  She tells me that the seroma is not bothering her significantly and therefore she would not want to put herself through any more interventions.  She will return every 4 weeks for her Zoladex injections I am seeing her back in November 2024 for follow-up   I discussed the assessment and treatment plan with the patient. The patient was provided an opportunity to ask questions and all were answered. The patient agreed with the plan and demonstrated an understanding of the instructions. The patient was advised to call back or seek an in-person evaluation if the symptoms worsen or if the condition fails to improve as anticipated.   I provided 12 minutes of non-face-to-face time during this encounter.  This includes time for charting and coordination of care   Heidi Costa  Heidi Hoit, MD  I Heidi Costa am acting as a Neurosurgeon for [employer]  I have reviewed the above documentation for accuracy and completeness, and I agree with the above.

## 2022-08-12 ENCOUNTER — Ambulatory Visit: Payer: 59 | Admitting: Podiatry

## 2022-08-13 ENCOUNTER — Inpatient Hospital Stay (HOSPITAL_BASED_OUTPATIENT_CLINIC_OR_DEPARTMENT_OTHER): Payer: 59 | Admitting: Hematology and Oncology

## 2022-08-13 DIAGNOSIS — C50411 Malignant neoplasm of upper-outer quadrant of right female breast: Secondary | ICD-10-CM

## 2022-08-13 DIAGNOSIS — Z170 Estrogen receptor positive status [ER+]: Secondary | ICD-10-CM | POA: Diagnosis not present

## 2022-08-13 NOTE — Assessment & Plan Note (Signed)
stage IIb right breast ER positive, HER2 positive breast cancer diagnosed in March 2021 status post neoadjuvant chemotherapy, lumpectomy, adjuvant radiation, maintenance Herceptin Perjeta, and antiestrogen therapy with Zoladex and letrozole.   Treatment plan: 1. Neoadjuvant chemotherapy with TCH Perjeta 5 cycles started 07/12/2019 completed 10/05/2019 followed by Herceptin Perjeta completed 07/18/2020 2. breast conserving surgery with sentinel lymph node study: 12/13/2019: Pathologic complete response, 0/5 lymph nodes negative 3. Adjuvant radiation therapy 01/23/2020- 03/12/2020 4.  Followed by antiestrogen therapy with anastrozole 1 mg daily started 03/30/2020 ----------------------------------------------------------------------------------------------------------------------------------------------------------- Current treatment:  Letrozole with Zoladex   ER and HER2 positive breast cancer: She will continue on letrozole and Zoladex.  She is due for an injection today. New right breast nodule: Mammogram and ultrasound 08/03/2022: Benign (seroma measuring 6 cm: Aspiration was attempted but no fluid obtained secondary to viscosity).  She will return every 4 weeks for her Zoladex injections

## 2022-08-18 ENCOUNTER — Encounter: Payer: Self-pay | Admitting: Hematology and Oncology

## 2022-08-25 ENCOUNTER — Other Ambulatory Visit: Payer: Self-pay | Admitting: Internal Medicine

## 2022-08-25 DIAGNOSIS — E782 Mixed hyperlipidemia: Secondary | ICD-10-CM

## 2022-08-27 ENCOUNTER — Other Ambulatory Visit: Payer: Self-pay

## 2022-08-27 ENCOUNTER — Inpatient Hospital Stay: Payer: 59

## 2022-08-27 ENCOUNTER — Telehealth: Payer: Self-pay

## 2022-08-27 VITALS — BP 138/78 | HR 66 | Temp 98.1°F | Resp 18

## 2022-08-27 DIAGNOSIS — Z170 Estrogen receptor positive status [ER+]: Secondary | ICD-10-CM

## 2022-08-27 DIAGNOSIS — Z5111 Encounter for antineoplastic chemotherapy: Secondary | ICD-10-CM | POA: Diagnosis not present

## 2022-08-27 DIAGNOSIS — Z95828 Presence of other vascular implants and grafts: Secondary | ICD-10-CM

## 2022-08-27 MED ORDER — GOSERELIN ACETATE 3.6 MG ~~LOC~~ IMPL
3.6000 mg | DRUG_IMPLANT | Freq: Once | SUBCUTANEOUS | Status: AC
  Administered 2022-08-27: 3.6 mg via SUBCUTANEOUS
  Filled 2022-08-27: qty 3.6

## 2022-08-27 NOTE — Telephone Encounter (Signed)
PA has been APPROVED from 08/27/2022-08/26/2023

## 2022-08-27 NOTE — Telephone Encounter (Signed)
*  ENDO  PA request received via CMM for Farxiga 10MG  tablets  PA submitted to [employer] and is pending additional questions/determination  Key: [employer]

## 2022-09-03 ENCOUNTER — Encounter: Payer: Self-pay | Admitting: Internal Medicine

## 2022-09-03 ENCOUNTER — Ambulatory Visit (INDEPENDENT_AMBULATORY_CARE_PROVIDER_SITE_OTHER): Payer: 59 | Admitting: Internal Medicine

## 2022-09-03 VITALS — BP 130/88 | HR 84 | Temp 98.5°F | Ht 65.0 in | Wt 207.6 lb

## 2022-09-03 DIAGNOSIS — E6609 Other obesity due to excess calories: Secondary | ICD-10-CM

## 2022-09-03 DIAGNOSIS — Z6834 Body mass index (BMI) 34.0-34.9, adult: Secondary | ICD-10-CM

## 2022-09-03 DIAGNOSIS — I10 Essential (primary) hypertension: Secondary | ICD-10-CM | POA: Diagnosis not present

## 2022-09-03 DIAGNOSIS — E1169 Type 2 diabetes mellitus with other specified complication: Secondary | ICD-10-CM | POA: Diagnosis not present

## 2022-09-03 DIAGNOSIS — E785 Hyperlipidemia, unspecified: Secondary | ICD-10-CM

## 2022-09-03 DIAGNOSIS — E538 Deficiency of other specified B group vitamins: Secondary | ICD-10-CM

## 2022-09-03 DIAGNOSIS — C50411 Malignant neoplasm of upper-outer quadrant of right female breast: Secondary | ICD-10-CM

## 2022-09-03 DIAGNOSIS — E559 Vitamin D deficiency, unspecified: Secondary | ICD-10-CM

## 2022-09-03 DIAGNOSIS — K5909 Other constipation: Secondary | ICD-10-CM

## 2022-09-03 DIAGNOSIS — E66811 Obesity, class 1: Secondary | ICD-10-CM

## 2022-09-03 DIAGNOSIS — Z170 Estrogen receptor positive status [ER+]: Secondary | ICD-10-CM

## 2022-09-03 MED ORDER — CYANOCOBALAMIN 1000 MCG/ML IJ SOLN
1000.0000 ug | Freq: Once | INTRAMUSCULAR | Status: AC
Start: 2022-09-03 — End: 2022-09-03
  Administered 2022-09-03: 1000 ug via INTRAMUSCULAR

## 2022-09-03 NOTE — Patient Instructions (Signed)

## 2022-09-03 NOTE — Progress Notes (Signed)
Subjective:  Patient ID: Heidi Costa , female    DOB: August 29, 1980 , 42 y.o.   MRN: [id number]  Chief Complaint  Patient presents with   Diabetes   Hypertension    HPI  The patient is here today for a diabetes & blood pressure f/u. Patient reports compliance with her meds. She is on both Ozempic and [place]. She has not had any issues with either medication. Denies headache, chest pain, and SOB.   She is now followed by Endo for her diabetes.      Diabetes She presents for her follow-up diabetic visit. She has type 2 diabetes mellitus. Her disease course has been stable. Pertinent negatives for hypoglycemia include no dizziness or headaches. Pertinent negatives for diabetes include no chest pain, no polydipsia, no polyphagia, no polyuria and no weakness. There are no hypoglycemic complications. Risk factors for coronary artery disease include diabetes mellitus and obesity.  Hypertension This is a chronic problem. The problem has been gradually improving since onset. The problem is controlled. Pertinent negatives include no chest pain or headaches. Risk factors for coronary artery disease include diabetes mellitus, obesity and sedentary lifestyle. Past treatments include beta blockers. The current treatment provides moderate improvement.     Past Medical History:  Diagnosis Date   Abnormality of gait 08/02/2012   resolved per patient on 07/12/19   Anemia    iron taking in the past, not currently   Anxiety    Arthritis    spine- cerv.  & arm    Asthma    Bulging lumbar disc    Cancer (HCC)    Right Breast-starts chemo on 07/14/19   Chronic arthralgias of knees and hips 08/02/2012   Depression    Pt. took self off Celexa one month ago, states she did n't like how it made her feel .   Family history of cancer    GERD (gastroesophageal reflux disease)    diet controlled   Heart murmur    pt. been told that this is a fact, no echo or cardiac surveillance in the past    HLD  (hyperlipidemia)    no meds, diet controlled   Hypertension    MRSA (methicillin resistant Staphylococcus aureus) 03/02/2005   Obesity    Spinal stenosis      Family History  Problem Relation Age of Onset   Osteoarthritis Mother    Hypertension Mother    Diabetes Mother    Hypertension Father    Gout Father    Stroke Father 63       embolic   Diabetes Half-Brother    Hypertension Half-Brother    Heart failure Half-Brother 97   Kidney disease Half-Brother    Diabetes Paternal Grandfather    Cancer Paternal Grandfather 70       breast cancer     Current Outpatient Medications:    acetaminophen (TYLENOL) 500 MG tablet, Take 500-1,000 mg by mouth daily as needed for pain., Disp: , Rfl:    albuterol (VENTOLIN HFA) 108 (90 Base) MCG/ACT inhaler, Inhale 2 puffs into the lungs every 6 (six) hours as needed for wheezing or shortness of breath., Disp: 18 g, Rfl: 2   atorvastatin (LIPITOR) 10 MG tablet, Take 1 tablet by mouth once daily, Disp: 30 tablet, Rfl: 0   baclofen (LIORESAL) 10 MG tablet, TAKE 1 TABLET BY MOUTH THREE TIMES DAILY, Disp: 90 tablet, Rfl: 0   blood glucose meter kit and supplies KIT, Dispense based on patient and insurance preference.  Use up to four times daily as directed. (FOR ICD-137). For QAC - HS accuchecks.  Dispense any approved glucometer with 1 month testing supplies., Disp: 1 each, Rfl: 1   Blood Glucose Monitoring Suppl (ACCU-CHEK GUIDE) w/Device KIT, Use to check blood sugar 2 times daily, Disp: 1 kit, Rfl: 0   buPROPion (WELLBUTRIN XL) 150 MG 24 hr tablet, TAKE 1 TABLET BY MOUTH ONCE DAILY IN THE MORNING, Disp: 90 tablet, Rfl: 1   carvedilol (COREG) 6.25 MG tablet, TAKE 1 TABLET BY MOUTH TWICE DAILY WITH A MEAL, Disp: 180 tablet, Rfl: 0   dapagliflozin propanediol (FARXIGA) 10 MG TABS tablet, Take 1 tablet (10 mg total) by mouth daily., Disp: 90 tablet, Rfl: 3   glucose blood test strip, Use as instructed, Disp: 100 each, Rfl: 12   letrozole (FEMARA) 2.5  MG tablet, Take 1 tablet (2.5 mg total) by mouth daily., Disp: 90 tablet, Rfl: 3   Semaglutide, 1 MG/DOSE, (OZEMPIC, 1 MG/DOSE,) 4 MG/3ML SOPN, Inject 1 mg into the skin once a week., Disp: 9 mL, Rfl: 3   Vitamin D, Ergocalciferol, (DRISDOL) 1.25 MG (50000 UNIT) CAPS capsule, Take 1 capsule by mouth once a week, Disp: 12 capsule, Rfl: 0   Allergies  Allergen Reactions   Aspirin Rash     Review of Systems  Constitutional: Negative.   Respiratory: Negative.    Cardiovascular: Negative.  Negative for chest pain.  Gastrointestinal: Negative.   Endocrine: Negative for polydipsia, polyphagia and polyuria.  Musculoskeletal: Negative.   Neurological: Negative.  Negative for dizziness, weakness and headaches.  Psychiatric/Behavioral: Negative.       Today's Vitals   09/03/22 1603  BP: 130/88  Pulse: 84  Temp: 98.5 F (36.9 C)  SpO2: 98%  Weight: 207 lb 9.6 oz (94.2 kg)  Height: 5\' 5"  (1.651 m)   Body mass index is 34.55 kg/m.  Wt Readings from Last 3 Encounters:  09/03/22 207 lb 9.6 oz (94.2 kg)  08/07/22 208 lb (94.3 kg)  07/30/22 212 lb 9.6 oz (96.4 kg)    The 10-year ASCVD risk score (Arnett DK, et al., 2019) is: 18.7%   Values used to calculate the score:     Age: 78 years     Sex: Female     Is Non-Hispanic African American: Yes     Diabetic: Yes     Tobacco smoker: Yes     Systolic Blood Pressure: 130 mmHg     Is BP treated: Yes     HDL Cholesterol: 37 mg/dL     Total Cholesterol: 177 mg/dL  Objective:  Physical Exam Vitals and nursing note reviewed.  Constitutional:      Appearance: Normal appearance. She is obese.  HENT:     Head: Normocephalic and atraumatic.  Eyes:     Extraocular Movements: Extraocular movements intact.  Cardiovascular:     Rate and Rhythm: Normal rate and regular rhythm.     Heart sounds: Normal heart sounds.  Pulmonary:     Effort: Pulmonary effort is normal.     Breath sounds: Normal breath sounds.  Musculoskeletal:     Cervical  back: Normal range of motion.  Skin:    General: Skin is warm.  Neurological:     General: No focal deficit present.     Mental Status: She is alert.  Psychiatric:        Mood and Affect: Mood normal.        Behavior: Behavior normal.  Assessment And Plan:  1. Dyslipidemia associated with type 2 diabetes mellitus (HCC) Comments: Chronic, now followed by Endo. She will c/w Farxiga 10mg  and Ozempic 1mg  weekly. encouraged to exercise regularly.  2. Essential hypertension, benign Comments: Chronic, fair control. Goal BP<120/80.  She will c/w carvedilol twice daily. Will consider adding ARB therapy or amlodipine nightly. Follow low salt diet.  3. Vitamin B12 deficiency Comments: I will check vitamin B12 levels. She was also given vitamin B12 Im x 1 after labdraw. - cyanocobalamin (VITAMIN B12) injection 1,000 mcg - Vitamin B12  4. Vitamin D deficiency disease Comments: I will check a vitamin D level and supplement as needed. - CMP14+EGFR - Vitamin D (25 hydroxy)  5. Chronic constipation Comments: Exacerbated by Ozempic. Will try Miralax.  She is also advised to increase water/fiber intake. She will let me know if her sx persist. Will consider Linzess.  6. Malignant neoplasm of upper-outer quadrant of right breast in female, estrogen receptor positive [facility]) Comments: Diagnosed March 2021. She is s/p  neoadjuvant chemo, lumpectomy, XRT, maintenance Herceptin Perjeta, and antiestrogen therapy w/ Zoladex and letrozole.  7. Class 1 obesity due to excess calories with serious comorbidity and body mass index (BMI) of 34.0 to 34.9 in adult She is encouraged to strive for BMI less than 30 to decrease cardiac risk. Advised to aim for at least 150 minutes of exercise per week.    Return in 3 months (on 12/04/2022), or [place]- b12 shot.  Patient was given opportunity to ask questions. Patient verbalized understanding of the plan and was able to repeat key elements of the plan. All questions  were answered to their satisfaction.   I, Gwynneth Aliment, MD, have reviewed all documentation for this visit. The documentation on 09/12/22 for the exam, diagnosis, procedures, and orders are all accurate and complete.   IF YOU HAVE BEEN REFERRED TO A SPECIALIST, IT MAY TAKE 1-2 WEEKS TO SCHEDULE/PROCESS THE REFERRAL. IF YOU HAVE NOT HEARD FROM US/SPECIALIST IN TWO WEEKS, PLEASE GIVE [place] A CALL AT [phone].

## 2022-09-04 LAB — CMP14+EGFR
ALT: 10 IU/L (ref 0–32)
AST: 15 IU/L (ref 0–40)
Albumin/Globulin Ratio: 1.6 (ref 1.2–2.2)
Albumin: 4.3 g/dL (ref 3.9–4.9)
Alkaline Phosphatase: 113 IU/L (ref 44–121)
BUN/Creatinine Ratio: 13 (ref 9–23)
BUN: 10 mg/dL (ref 6–24)
Bilirubin Total: 0.4 mg/dL (ref 0.0–1.2)
CO2: 24 mmol/L (ref 20–29)
Calcium: 9.7 mg/dL (ref 8.7–10.2)
Chloride: 102 mmol/L (ref 96–106)
Creatinine, Ser: 0.79 mg/dL (ref 0.57–1.00)
Globulin, Total: 2.7 g/dL (ref 1.5–4.5)
Glucose: 80 mg/dL (ref 70–99)
Potassium: 4.3 mmol/L (ref 3.5–5.2)
Sodium: 139 mmol/L (ref 134–144)
Total Protein: 7 g/dL (ref 6.0–8.5)
eGFR: 96 mL/min/{1.73_m2} (ref >59)

## 2022-09-04 LAB — VITAMIN D 25 HYDROXY (VIT D DEFICIENCY, FRACTURES): Vit D, 25-Hydroxy: 52.2 ng/mL (ref 30.0–100.0)

## 2022-09-04 LAB — VITAMIN B12: Vitamin B-12: 316 pg/mL (ref 232–1245)

## 2022-09-24 ENCOUNTER — Inpatient Hospital Stay: Payer: 59 | Attending: Hematology and Oncology

## 2022-09-24 VITALS — BP 129/91 | HR 79 | Temp 98.8°F | Resp 18

## 2022-09-24 DIAGNOSIS — Z170 Estrogen receptor positive status [ER+]: Secondary | ICD-10-CM

## 2022-09-24 DIAGNOSIS — C50411 Malignant neoplasm of upper-outer quadrant of right female breast: Secondary | ICD-10-CM | POA: Insufficient documentation

## 2022-09-24 DIAGNOSIS — Z5111 Encounter for antineoplastic chemotherapy: Secondary | ICD-10-CM | POA: Diagnosis present

## 2022-09-24 DIAGNOSIS — Z95828 Presence of other vascular implants and grafts: Secondary | ICD-10-CM

## 2022-09-24 MED ORDER — GOSERELIN ACETATE 3.6 MG ~~LOC~~ IMPL
3.6000 mg | DRUG_IMPLANT | Freq: Once | SUBCUTANEOUS | Status: AC
  Administered 2022-09-24: 3.6 mg via SUBCUTANEOUS
  Filled 2022-09-24: qty 3.6

## 2022-09-28 ENCOUNTER — Other Ambulatory Visit: Payer: Self-pay | Admitting: Internal Medicine

## 2022-09-28 ENCOUNTER — Other Ambulatory Visit: Payer: Self-pay | Admitting: Hematology and Oncology

## 2022-09-28 DIAGNOSIS — E782 Mixed hyperlipidemia: Secondary | ICD-10-CM

## 2022-10-05 ENCOUNTER — Encounter: Payer: Self-pay | Admitting: Hematology and Oncology

## 2022-10-15 ENCOUNTER — Encounter: Payer: Self-pay | Admitting: Hematology and Oncology

## 2022-10-21 ENCOUNTER — Other Ambulatory Visit: Payer: Self-pay | Admitting: Hematology and Oncology

## 2022-10-22 ENCOUNTER — Inpatient Hospital Stay: Payer: No Typology Code available for payment source | Attending: Hematology and Oncology

## 2022-10-22 VITALS — BP 118/85 | HR 67 | Temp 98.8°F | Resp 18

## 2022-10-22 DIAGNOSIS — Z95828 Presence of other vascular implants and grafts: Secondary | ICD-10-CM

## 2022-10-22 DIAGNOSIS — Z5111 Encounter for antineoplastic chemotherapy: Secondary | ICD-10-CM | POA: Diagnosis not present

## 2022-10-22 DIAGNOSIS — C50411 Malignant neoplasm of upper-outer quadrant of right female breast: Secondary | ICD-10-CM | POA: Diagnosis not present

## 2022-10-22 DIAGNOSIS — Z170 Estrogen receptor positive status [ER+]: Secondary | ICD-10-CM | POA: Insufficient documentation

## 2022-10-22 MED ORDER — GOSERELIN ACETATE 3.6 MG ~~LOC~~ IMPL
3.6000 mg | DRUG_IMPLANT | Freq: Once | SUBCUTANEOUS | Status: AC
  Administered 2022-10-22: 3.6 mg via SUBCUTANEOUS
  Filled 2022-10-22: qty 3.6

## 2022-10-23 ENCOUNTER — Other Ambulatory Visit: Payer: Self-pay | Admitting: Internal Medicine

## 2022-10-23 DIAGNOSIS — F320 Major depressive disorder, single episode, mild: Secondary | ICD-10-CM

## 2022-10-27 ENCOUNTER — Ambulatory Visit: Payer: 59 | Admitting: Radiology

## 2022-10-28 ENCOUNTER — Ambulatory Visit: Payer: 59 | Admitting: Podiatry

## 2022-11-02 ENCOUNTER — Encounter: Payer: Self-pay | Admitting: Hematology and Oncology

## 2022-11-15 ENCOUNTER — Other Ambulatory Visit: Payer: Self-pay | Admitting: Internal Medicine

## 2022-11-15 DIAGNOSIS — E782 Mixed hyperlipidemia: Secondary | ICD-10-CM

## 2022-11-19 ENCOUNTER — Encounter: Payer: Self-pay | Admitting: Hematology and Oncology

## 2022-11-19 ENCOUNTER — Other Ambulatory Visit: Payer: Self-pay

## 2022-11-19 ENCOUNTER — Inpatient Hospital Stay: Payer: No Typology Code available for payment source | Attending: Hematology and Oncology

## 2022-11-19 VITALS — BP 120/94 | HR 87 | Temp 98.4°F | Resp 18

## 2022-11-19 DIAGNOSIS — Z5111 Encounter for antineoplastic chemotherapy: Secondary | ICD-10-CM | POA: Insufficient documentation

## 2022-11-19 DIAGNOSIS — Z79811 Long term (current) use of aromatase inhibitors: Secondary | ICD-10-CM | POA: Diagnosis not present

## 2022-11-19 DIAGNOSIS — C50411 Malignant neoplasm of upper-outer quadrant of right female breast: Secondary | ICD-10-CM | POA: Insufficient documentation

## 2022-11-19 DIAGNOSIS — Z170 Estrogen receptor positive status [ER+]: Secondary | ICD-10-CM | POA: Insufficient documentation

## 2022-11-19 DIAGNOSIS — Z95828 Presence of other vascular implants and grafts: Secondary | ICD-10-CM

## 2022-11-19 MED ORDER — GOSERELIN ACETATE 3.6 MG ~~LOC~~ IMPL
3.6000 mg | DRUG_IMPLANT | Freq: Once | SUBCUTANEOUS | Status: AC
  Administered 2022-11-19: 3.6 mg via SUBCUTANEOUS
  Filled 2022-11-19: qty 3.6

## 2022-12-08 ENCOUNTER — Ambulatory Visit: Payer: 59

## 2022-12-12 ENCOUNTER — Other Ambulatory Visit: Payer: Self-pay | Admitting: Internal Medicine

## 2022-12-12 DIAGNOSIS — E782 Mixed hyperlipidemia: Secondary | ICD-10-CM

## 2022-12-17 ENCOUNTER — Other Ambulatory Visit: Payer: Self-pay

## 2022-12-17 ENCOUNTER — Inpatient Hospital Stay: Payer: No Typology Code available for payment source | Attending: Hematology and Oncology

## 2022-12-17 VITALS — BP 132/81 | HR 68 | Temp 99.3°F | Resp 18

## 2022-12-17 DIAGNOSIS — C50411 Malignant neoplasm of upper-outer quadrant of right female breast: Secondary | ICD-10-CM | POA: Insufficient documentation

## 2022-12-17 DIAGNOSIS — Z170 Estrogen receptor positive status [ER+]: Secondary | ICD-10-CM | POA: Diagnosis not present

## 2022-12-17 DIAGNOSIS — Z5111 Encounter for antineoplastic chemotherapy: Secondary | ICD-10-CM | POA: Diagnosis present

## 2022-12-17 DIAGNOSIS — Z95828 Presence of other vascular implants and grafts: Secondary | ICD-10-CM

## 2022-12-17 MED ORDER — GOSERELIN ACETATE 3.6 MG ~~LOC~~ IMPL
3.6000 mg | DRUG_IMPLANT | Freq: Once | SUBCUTANEOUS | Status: AC
  Administered 2022-12-17: 3.6 mg via SUBCUTANEOUS
  Filled 2022-12-17: qty 3.6

## 2022-12-30 ENCOUNTER — Encounter: Payer: Self-pay | Admitting: Hematology and Oncology

## 2022-12-31 ENCOUNTER — Encounter: Payer: Self-pay | Admitting: Hematology and Oncology

## 2023-01-06 ENCOUNTER — Ambulatory Visit (INDEPENDENT_AMBULATORY_CARE_PROVIDER_SITE_OTHER): Payer: No Typology Code available for payment source | Admitting: Radiology

## 2023-01-06 ENCOUNTER — Other Ambulatory Visit (HOSPITAL_COMMUNITY)
Admission: RE | Admit: 2023-01-06 | Discharge: 2023-01-06 | Disposition: A | Discharge status: Home / Self Care | Payer: No Typology Code available for payment source | Source: Ambulatory Visit | Attending: Radiology | Admitting: Radiology

## 2023-01-06 ENCOUNTER — Encounter: Payer: Self-pay | Admitting: Radiology

## 2023-01-06 VITALS — BP 122/84 | Ht 64.5 in | Wt 204.0 lb

## 2023-01-06 DIAGNOSIS — Z170 Estrogen receptor positive status [ER+]: Secondary | ICD-10-CM | POA: Diagnosis not present

## 2023-01-06 DIAGNOSIS — Z01419 Encounter for gynecological examination (general) (routine) without abnormal findings: Secondary | ICD-10-CM | POA: Insufficient documentation

## 2023-01-06 DIAGNOSIS — C50411 Malignant neoplasm of upper-outer quadrant of right female breast: Secondary | ICD-10-CM | POA: Diagnosis not present

## 2023-01-06 NOTE — Progress Notes (Signed)
Heidi Costa 1981/02/25 [id number]   History: Postmenopausal 42 y.o. presents for annual exam. Hx Rt Breast Ca Stage IIB with Estrogen receptors positive post Rt lumpectomy/Radiation therapy/Chemotherapy/Arimidex, now on letrozole- less stomach aches. Genetic testing negative.    Gynecologic History Postmenopausal Last Pap: 2021. Results were: normal Last mammogram: 2024. Results were: normal  Obstetric History OB History  Gravida Para Term Preterm AB Living  1       0 1  SAB IAB Ectopic Multiple Live Births  0   0        # Outcome Date GA Lbr Len/2nd Weight Sex Type Anes PTL Lv  1 Gravida              The following portions of the patient's history were reviewed and updated as appropriate: allergies, current medications, past family history, past medical history, past social history, past surgical history, and problem list.  Review of Systems Pertinent items noted in HPI and remainder of comprehensive ROS otherwise negative.  Past medical history, past surgical history, family history and social history were all reviewed and documented in the EPIC chart.  Exam:  Vitals:   01/06/23 1528  BP: 122/84  Weight: 204 lb (92.5 kg)  Height: 5' 4.5" (1.638 m)   Body mass index is 34.48 kg/m.  General appearance:  Normal Thyroid:  Symmetrical, normal in size, without palpable masses or nodularity. Respiratory  Auscultation:  Clear without wheezing or rhonchi Cardiovascular  Auscultation:  Regular rate, without rubs, murmurs or gallops  Edema/varicosities:  Not grossly evident Abdominal  Soft,nontender, without masses, guarding or rebound.  Liver/spleen:  No organomegaly noted  Hernia:  None appreciated  Skin  Inspection:  Grossly normal Breasts: Examined lying and sitting.   Right: Without masses, retractions, nipple discharge or axillary adenopathy.   Left: Without masses, retractions, nipple discharge or axillary adenopathy. Genitourinary   Inguinal/mons:   Normal without inguinal adenopathy  External genitalia:  Normal appearing vulva with no masses, tenderness, or lesions  BUS/Urethra/Skene's glands:  Normal  Vagina:  Normal appearing with normal color and discharge, no lesions.   Cervix:  Normal appearing without discharge or lesions  Uterus:  Normal in size, shape and contour.  Midline and mobile, nontender  Adnexa/parametria:     Rt: Normal in size, without masses or tenderness.   Lt: Normal in size, without masses or tenderness.  Anus and perineum: Normal    Raynelle Fanning, CMA present for exam  Assessment/Plan:   1. Well woman exam with routine gynecological exam Pap Mammo up to date Colonoscopy at 44 Labs with PCP  2. Malignant neoplasm of upper-outer quadrant of right breast in female, estrogen receptor positive (HCC) Followed by oncology Mammogram negative    Discussed SBE, pap and mammogram screening as directed. Recommend of exercise weekly, including weight bearing exercise. Encouraged the use of seatbelts and sunscreen.  Return in 1 year for annual or sooner prn.  Tanda Rockers WHNP-BC, 3:33 PM 01/06/2023

## 2023-01-11 LAB — CYTOLOGY - PAP
Comment: NEGATIVE
Comment: NEGATIVE
Comment: NEGATIVE
Diagnosis: UNDETERMINED — AB
HPV 16: NEGATIVE
HPV 18 / 45: NEGATIVE
High risk HPV: POSITIVE — AB

## 2023-01-13 ENCOUNTER — Other Ambulatory Visit: Payer: Self-pay | Admitting: *Deleted

## 2023-01-13 DIAGNOSIS — R87619 Unspecified abnormal cytological findings in specimens from cervix uteri: Secondary | ICD-10-CM

## 2023-01-13 DIAGNOSIS — R87610 Atypical squamous cells of undetermined significance on cytologic smear of cervix (ASC-US): Secondary | ICD-10-CM

## 2023-01-14 ENCOUNTER — Inpatient Hospital Stay: Payer: No Typology Code available for payment source | Attending: Hematology and Oncology

## 2023-01-14 ENCOUNTER — Other Ambulatory Visit: Payer: Self-pay

## 2023-01-14 VITALS — BP 128/91 | HR 71 | Temp 98.2°F | Resp 16

## 2023-01-14 DIAGNOSIS — Z170 Estrogen receptor positive status [ER+]: Secondary | ICD-10-CM | POA: Insufficient documentation

## 2023-01-14 DIAGNOSIS — Z95828 Presence of other vascular implants and grafts: Secondary | ICD-10-CM

## 2023-01-14 DIAGNOSIS — C50411 Malignant neoplasm of upper-outer quadrant of right female breast: Secondary | ICD-10-CM | POA: Diagnosis not present

## 2023-01-14 DIAGNOSIS — Z5111 Encounter for antineoplastic chemotherapy: Secondary | ICD-10-CM | POA: Diagnosis present

## 2023-01-14 MED ORDER — GOSERELIN ACETATE 3.6 MG ~~LOC~~ IMPL
3.6000 mg | DRUG_IMPLANT | Freq: Once | SUBCUTANEOUS | Status: AC
  Administered 2023-01-14: 3.6 mg via SUBCUTANEOUS
  Filled 2023-01-14: qty 3.6

## 2023-01-20 ENCOUNTER — Ambulatory Visit (HOSPITAL_COMMUNITY)
Admission: EM | Admit: 2023-01-20 | Discharge: 2023-01-20 | Disposition: A | Discharge status: Home / Self Care | Payer: No Typology Code available for payment source | Attending: Emergency Medicine | Admitting: Emergency Medicine

## 2023-01-20 ENCOUNTER — Encounter (HOSPITAL_COMMUNITY): Payer: Self-pay

## 2023-01-20 DIAGNOSIS — Z1152 Encounter for screening for COVID-19: Secondary | ICD-10-CM | POA: Insufficient documentation

## 2023-01-20 DIAGNOSIS — J069 Acute upper respiratory infection, unspecified: Secondary | ICD-10-CM | POA: Insufficient documentation

## 2023-01-20 DIAGNOSIS — R519 Headache, unspecified: Secondary | ICD-10-CM | POA: Diagnosis present

## 2023-01-20 DIAGNOSIS — R509 Fever, unspecified: Secondary | ICD-10-CM | POA: Diagnosis present

## 2023-01-20 DIAGNOSIS — Z853 Personal history of malignant neoplasm of breast: Secondary | ICD-10-CM | POA: Insufficient documentation

## 2023-01-20 LAB — POCT INFLUENZA A/B
Influenza A, POC: NEGATIVE
Influenza B, POC: NEGATIVE

## 2023-01-20 MED ORDER — ACETAMINOPHEN 325 MG PO TABS
ORAL_TABLET | ORAL | Status: AC
Start: 2023-01-20 — End: ?
  Filled 2023-01-20: qty 3

## 2023-01-20 MED ORDER — ACETAMINOPHEN 325 MG PO TABS
975.0000 mg | ORAL_TABLET | Freq: Once | ORAL | Status: AC
  Administered 2023-01-20: 975 mg via ORAL

## 2023-01-20 NOTE — ED Provider Notes (Signed)
[facility]    CSN: [id number] Arrival date & time: 01/20/23  1712      History   Chief Complaint Chief Complaint  Patient presents with   Cough   Generalized Body Aches    HPI Heidi Costa is a 42 y.o. female.   Patient presents to clinic for complaints of bodyaches, dry cough, headache, chills, nasal congestion and feeling unwell that started yesterday.  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  Patient had chills today, was unaware that she had a fever.  She denies any sore throat, nausea, vomiting or diarrhea.  She has had some wheezing and shortness of breath for which she has used her albuterol inhaler, and it has improved her symptoms.  She does have a history of breast cancer, she is finished with chemo and radiation, is currently doing hormone therapy infusions.    The history is provided by the patient and medical records.  Cough Associated symptoms: chills, fever, headaches and myalgias   Associated symptoms: no chest pain, no shortness of breath, no sore throat and no wheezing     Past Medical History:  Diagnosis Date   Abnormality of gait 08/02/2012   resolved per patient on 07/12/19   Anemia    iron taking in the past, not currently   Anxiety    Arthritis    spine- cerv.  & arm    Asthma    Bulging lumbar disc    Cancer (HCC)    Right Breast-starts chemo on 07/14/19   Chronic arthralgias of knees and hips 08/02/2012   Depression    Pt. took self off Celexa one month ago, states she did n't like how it made her feel .   Family history of cancer    GERD (gastroesophageal reflux disease)    diet controlled   Heart murmur    pt. been told that this is a fact, no echo or cardiac surveillance in the past    HLD (hyperlipidemia)    no meds, diet controlled   Hypertension    MRSA (methicillin resistant Staphylococcus aureus) 03/02/2005   Obesity    Spinal stenosis     Patient Active Problem List   Diagnosis  Date Noted   Diabetes mellitus due to underlying condition with ketoacidosis without coma, with long-term current use of insulin (HCC) 03/08/2022   Mixed hyperlipidemia 03/08/2022   Vitamin D deficiency disease 03/08/2022   Acute bilateral low back pain without sciatica 03/08/2022   Type 2 diabetes mellitus without complication, without long-term current use of insulin (HCC) 09/23/2021   Dyslipidemia 09/23/2021   DKA (diabetic ketoacidosis) (HCC) 03/22/2021   Intractable nausea and vomiting 03/22/2021   AKI (acute kidney injury) (HCC) 03/22/2021   Breast cancer (HCC) 03/22/2021   Thrombocytosis 03/22/2021   DKA, type 2 (HCC) 03/22/2021   Mild persistent asthma without complication 10/17/2019   Port-A-Cath in place 08/04/2019   Genetic testing 07/24/2019   Family history of cancer    Dysphagia 06/23/2019   Malignant neoplasm of upper-outer quadrant of right breast in female, estrogen receptor positive (HCC) 06/21/2019   Tobacco consumption 06/06/2019   Breast mass, right 06/06/2019   Sensation of change in body temperature 06/06/2019   Neuropathic pain 06/06/2019   Bite, insect 10/19/2016   Swelling of eyelid, left 10/19/2016   Essential hypertension 08/10/2016   Disturbance of skin sensation 05/08/2013   Cervical spondylosis with myelopathy 05/08/2013   Abnormality of gait 08/02/2012  Chronic arthralgias of knees and hips 08/02/2012    Past Surgical History:  Procedure Laterality Date   ANTERIOR CERVICAL DECOMP/DISCECTOMY FUSION N/A 10/27/2012   Procedure: ANTERIOR CERVICAL DECOMPRESSION/DISCECTOMY FUSION 1 LEVEL;  Surgeon: Reinaldo Meeker, MD;  Location: [facility];  Service: Neurosurgery;  Laterality: N/A;  ANTERIOR CERVICAL DECOMPRESSION/DISCECTOMY FUSION 1 LEVEL   BREAST LUMPECTOMY WITH RADIOACTIVE SEED AND SENTINEL LYMPH NODE BIOPSY Right 12/13/2019   Procedure: RIGHT BREAST LUMPECTOMY WITH RADIOACTIVE SEED AND RIGHT AXILLARY  SENTINEL LYMPH NODE BIOPSY, RIGHT AXILLARY NODE  SEED GUIDED EXCISION, BLUE DYE INJECTION;  Surgeon: Emelia Loron, MD;  Location: [facility];  Service: General;  Laterality: Right;  PEC BLOCK   BREAST REDUCTION SURGERY Bilateral 03/04/2021   Procedure: MAMMARY REDUCTION  (BREAST);  Surgeon: Contogiannis, Chales Abrahams, MD;  Location: [facility];  Service: Plastics;  Laterality: Bilateral;   CHALAZION EXCISION     L eye    PORTACATH PLACEMENT Left 07/13/2019   Procedure: INSERTION PORT-A-CATH WITH ULTRASOUND;  Surgeon: Emelia Loron, MD;  Location: [facility];  Service: General;  Laterality: Left;   VAGINAL DELIVERY  2002   WISDOM TOOTH EXTRACTION      OB History     Gravida  1   Para      Term      Preterm      AB  0   Living  1      SAB  0   IAB      Ectopic  0   Multiple      Live Births               Home Medications    Prior to Admission medications   Medication Sig Start Date End Date Taking? Authorizing Provider  acetaminophen (TYLENOL) 500 MG tablet Take 500-1,000 mg by mouth daily as needed for pain.    [provider]  albuterol (VENTOLIN HFA) 108 (90 Base) MCG/ACT inhaler Inhale 2 puffs into the lungs every 6 (six) hours as needed for wheezing or shortness of breath. 09/01/19   Lezlie Lye, Meda Coffee, MD  atorvastatin (LIPITOR) 10 MG tablet Take 1 tablet by mouth once daily 12/14/22   Dorothyann Peng, MD  baclofen (LIORESAL) 10 MG tablet TAKE 1 TABLET BY MOUTH THREE TIMES DAILY 10/21/22   Serena Croissant, MD  blood glucose meter kit and supplies KIT Dispense based on patient and insurance preference. Use up to four times daily as directed. (FOR ICD-137). For QAC - HS accuchecks.  Dispense any approved glucometer with 1 month testing supplies. 03/24/21   Leroy Sea, MD  Blood Glucose Monitoring Suppl (ACCU-CHEK GUIDE) w/Device KIT Use to check blood sugar 2 times daily 12/22/21   Shamleffer, Konrad Dolores, MD  buPROPion (WELLBUTRIN XL) 150 MG 24 hr tablet TAKE 1  TABLET BY MOUTH ONCE DAILY IN THE MORNING 10/23/22   Dorothyann Peng, MD  carvedilol (COREG) 6.25 MG tablet TAKE 1 TABLET BY MOUTH TWICE DAILY WITH A MEAL 09/29/22   Dorothyann Peng, MD  dapagliflozin propanediol (FARXIGA) 10 MG TABS tablet Take 1 tablet (10 mg total) by mouth daily. 08/07/22   Shamleffer, Konrad Dolores, MD  glucose blood test strip Use as instructed 12/19/21   Shamleffer, Konrad Dolores, MD  letrozole [facility]) 2.5 MG tablet Take 1 tablet (2.5 mg total) by mouth daily. 03/12/22   Serena Croissant, MD  Semaglutide, 1 MG/DOSE, (OZEMPIC, 1 MG/DOSE,) 4 MG/3ML SOPN Inject 1 mg into the skin once a week.  08/07/22   Shamleffer, Konrad Dolores, MD  prochlorperazine (COMPAZINE) 10 MG tablet Take 1 tablet (10 mg total) by mouth every 6 (six) hours as needed (Nausea or vomiting). 07/05/19 11/20/19  Serena Croissant, MD    Family History Family History  Problem Relation Age of Onset   Osteoarthritis Mother    Hypertension Mother    Diabetes Mother    Hypertension Father    Gout Father    Stroke Father 52       embolic   Diabetes Half-Brother    Hypertension Half-Brother    Heart failure Half-Brother 68   Kidney disease Half-Brother    Diabetes Paternal Grandfather    Cancer Paternal Grandfather 88       breast cancer    Social History Social History   Tobacco Use   Smoking status: Every Day    Current packs/day: 0.00    Average packs/day: 1 pack/day for 10.0 years (10.0 ttl pk-yrs)    Types: Cigarettes    Start date: 06/25/2010    Last attempt to quit: 06/24/2020    Years since quitting: 2.5    Passive exposure: Never   Smokeless tobacco: Never  Vaping Use   Vaping status: Never Used  Substance Use Topics   Alcohol use: Yes    Comment: occas   Drug use: Yes    Types: Marijuana    Comment: occasionally     Allergies   Aspirin   Review of Systems Review of Systems  Constitutional:  Positive for chills and fever.  HENT:  Positive for congestion. Negative for sore throat.    Respiratory:  Positive for cough. Negative for shortness of breath and wheezing.   Cardiovascular:  Negative for chest pain.  Gastrointestinal:  Negative for anal bleeding, nausea and vomiting.  Musculoskeletal:  Positive for arthralgias and myalgias.  Neurological:  Positive for headaches.     Physical Exam Triage Vital Signs ED Triage Vitals  Encounter Vitals Group     BP 01/20/23 1803 118/79     Systolic BP Percentile --      Diastolic BP Percentile --      Pulse Rate 01/20/23 1803 (!) 102     Resp 01/20/23 1803 18     Temp 01/20/23 1803 (!) 102.3 F (39.1 C)     Temp Source 01/20/23 1803 Oral     SpO2 01/20/23 1803 97 %     Weight --      Height --      Head Circumference --      Peak Flow --      Pain Score 01/20/23 1801 9     Pain Loc --      Pain Education --      Exclude from Growth Chart --    No data found.  Updated Vital Signs BP 118/79 (BP Location: Left Arm)   Pulse (!) 102   Temp 100.1 F (37.8 C) (Oral)   Resp 18   LMP  (LMP Unknown) Comment: June 2022  SpO2 97%   Visual Acuity Right Eye Distance:   Left Eye Distance:   Bilateral Distance:    Right Eye Near:   Left Eye Near:    Bilateral Near:     Physical Exam Vitals and nursing note reviewed.  Constitutional:      Appearance: Normal appearance.  HENT:     Head: Normocephalic and atraumatic.     Right Ear: External ear normal.     Left Ear: External ear normal.  Nose: Nose normal.     Mouth/Throat:     Mouth: Mucous membranes are moist.  Eyes:     Conjunctiva/sclera: Conjunctivae normal.  Cardiovascular:     Rate and Rhythm: Normal rate and regular rhythm.     Heart sounds: Normal heart sounds. No murmur heard. Pulmonary:     Effort: Pulmonary effort is normal. No respiratory distress.     Breath sounds: Normal breath sounds.  Musculoskeletal:        General: Normal range of motion.  Skin:    General: Skin is warm and dry.  Neurological:     General: No focal deficit  present.     Mental Status: She is alert and oriented to person, place, and time.  Psychiatric:        Mood and Affect: Mood normal.        Behavior: Behavior normal.      UC Treatments / Results  Labs (all labs ordered are listed, but only abnormal results are displayed) Labs Reviewed  SARS CORONAVIRUS 2 (TAT 6-24 HRS)  POCT INFLUENZA A/B    EKG   Radiology No results found.  Procedures Procedures (including critical care time)  Medications Ordered in UC Medications  acetaminophen (TYLENOL) tablet 975 mg (975 mg Oral Given 01/20/23 1832)    Initial Impression / Assessment and Plan / UC Course  I have reviewed the triage vital signs and the nursing notes.  Pertinent labs & imaging results that were available during my care of the patient were reviewed by me and considered in my medical decision making (see chart for details).  Vitals and triage reviewed, patient is hemodynamically stable.  Fever in clinic treated with Tylenol and improved.  Rapid flu negative, COVID-19 testing obtained.  Suspect viral illness due to abrupt onset of symptoms.  Lungs are vesicular, heart with regular rate and rhythm.  Physical exam overall reassuring.  Plan of care, follow-up care and return precautions given, no questions at this time.     Final Clinical Impressions(s) / UC Diagnoses   Final diagnoses:  Viral URI with cough     Discharge Instructions      Your flu testing was negative.  We have tested you for COVID-19 and results will be available tomorrow via MyChart.  Please alternate between Tylenol and ibuprofen every 4-6 hours for fever, body aches and chills.  For sore throat you can do warm saline gargles and sleep with a humidifier.  Seek immediate care if you have high fever despite antipyretics, no improvement over the next 5 to 7 days, or any new urgent symptoms.      ED Prescriptions   None    PDMP not reviewed this encounter.   Iracema Lanagan, [place] N,  [place] 01/20/23 [phone]

## 2023-01-20 NOTE — ED Triage Notes (Signed)
Pt c/o body aches, dry cough, HA, chest discomfort, chills, and nasal congestion x1 day. Denies sore throat, vomiting, or ear pain.  Has taken Aleve, alka seltzer, and used icy hot (for body aches) all with temporary relief.

## 2023-01-20 NOTE — Discharge Instructions (Signed)
Your flu testing was negative.  We have tested you for COVID-19 and results will be available tomorrow via MyChart.  Please alternate between Tylenol and ibuprofen every 4-6 hours for fever, body aches and chills.  For sore throat you can do warm saline gargles and sleep with a humidifier.  Seek immediate care if you have high fever despite antipyretics, no improvement over the next 5 to 7 days, or any new urgent symptoms.

## 2023-01-21 LAB — SARS CORONAVIRUS 2 (TAT 6-24 HRS): SARS Coronavirus 2: NEGATIVE

## 2023-01-29 ENCOUNTER — Other Ambulatory Visit (HOSPITAL_COMMUNITY)
Admission: RE | Admit: 2023-01-29 | Discharge: 2023-01-29 | Disposition: A | Discharge status: Home / Self Care | Payer: No Typology Code available for payment source | Source: Ambulatory Visit | Attending: Obstetrics and Gynecology | Admitting: Obstetrics and Gynecology

## 2023-01-29 ENCOUNTER — Encounter: Payer: Self-pay | Admitting: Obstetrics and Gynecology

## 2023-01-29 ENCOUNTER — Ambulatory Visit (INDEPENDENT_AMBULATORY_CARE_PROVIDER_SITE_OTHER): Payer: No Typology Code available for payment source | Admitting: Obstetrics and Gynecology

## 2023-01-29 VITALS — BP 128/86 | HR 71 | Ht 64.5 in | Wt 204.0 lb

## 2023-01-29 DIAGNOSIS — E28319 Asymptomatic premature menopause: Secondary | ICD-10-CM | POA: Diagnosis not present

## 2023-01-29 DIAGNOSIS — R87619 Unspecified abnormal cytological findings in specimens from cervix uteri: Secondary | ICD-10-CM | POA: Insufficient documentation

## 2023-01-29 DIAGNOSIS — R87610 Atypical squamous cells of undetermined significance on cytologic smear of cervix (ASC-US): Secondary | ICD-10-CM

## 2023-01-29 DIAGNOSIS — R87810 Cervical high risk human papillomavirus (HPV) DNA test positive: Secondary | ICD-10-CM

## 2023-01-29 MED ORDER — LIDOCAINE HCL (PF) 1 % IJ SOLN
10.0000 mL | Freq: Once | INTRAMUSCULAR | Status: AC
Start: 2023-01-29 — End: ?

## 2023-01-29 NOTE — Progress Notes (Signed)
42 y.o. G1P0001 postmenopausal female with history of right breast cancer on letrozole (dx 2021, s/p neoadjuvant chemotherapy, lumpectomy, adjuvant radiation, maintenance Herceptin) here for management of abnormal PAP.  No LMP recorded (lmp unknown). Patient is postmenopausal. Believes last cycle was in 2021 after completing chemotherapy.   01/06/23 AGC, ASCUS +other HR HPV Normal PAP 2018, 2021 (Hpv neg)   OB History  Gravida Para Term Preterm AB Living  1       0 1  SAB IAB Ectopic Multiple Live Births  0   0        # Outcome Date GA Lbr Len/2nd Weight Sex Type Anes PTL Lv  1 Gravida             Past Medical History:  Diagnosis Date   Abnormality of gait 08/02/2012   resolved per patient on 07/12/19   Anemia    iron taking in the past, not currently   Anxiety    Arthritis    spine- cerv.  & arm    Asthma    Bulging lumbar disc    Cancer (HCC)    Right Breast-starts chemo on 07/14/19   Chronic arthralgias of knees and hips 08/02/2012   Depression    Pt. took self off Celexa one month ago, states she did n't like how it made her feel .   Family history of cancer    GERD (gastroesophageal reflux disease)    diet controlled   Heart murmur    pt. been told that this is a fact, no echo or cardiac surveillance in the past    HLD (hyperlipidemia)    no meds, diet controlled   Hypertension    MRSA (methicillin resistant Staphylococcus aureus) 03/02/2005   Obesity    Spinal stenosis     Past Surgical History:  Procedure Laterality Date   ANTERIOR CERVICAL DECOMP/DISCECTOMY FUSION N/A 10/27/2012   Procedure: ANTERIOR CERVICAL DECOMPRESSION/DISCECTOMY FUSION 1 LEVEL;  Surgeon: Reinaldo Meeker, MD;  Location: [facility];  Service: Neurosurgery;  Laterality: N/A;  ANTERIOR CERVICAL DECOMPRESSION/DISCECTOMY FUSION 1 LEVEL   BREAST LUMPECTOMY WITH RADIOACTIVE SEED AND SENTINEL LYMPH NODE BIOPSY Right 12/13/2019   Procedure: RIGHT BREAST LUMPECTOMY WITH RADIOACTIVE SEED AND  RIGHT AXILLARY  SENTINEL LYMPH NODE BIOPSY, RIGHT AXILLARY NODE SEED GUIDED EXCISION, BLUE DYE INJECTION;  Surgeon: Emelia Loron, MD;  Location: [facility];  Service: General;  Laterality: Right;  PEC BLOCK   BREAST REDUCTION SURGERY Bilateral 03/04/2021   Procedure: MAMMARY REDUCTION  (BREAST);  Surgeon: Contogiannis, Chales Abrahams, MD;  Location: [facility];  Service: Plastics;  Laterality: Bilateral;   CHALAZION EXCISION     L eye    PORTACATH PLACEMENT Left 07/13/2019   Procedure: INSERTION PORT-A-CATH WITH ULTRASOUND;  Surgeon: Emelia Loron, MD;  Location: [facility];  Service: General;  Laterality: Left;   VAGINAL DELIVERY  2002   WISDOM TOOTH EXTRACTION      Current Outpatient Medications on File Prior to Visit  Medication Sig Dispense Refill   acetaminophen (TYLENOL) 500 MG tablet Take 500-1,000 mg by mouth daily as needed for pain.     albuterol (VENTOLIN HFA) 108 (90 Base) MCG/ACT inhaler Inhale 2 puffs into the lungs every 6 (six) hours as needed for wheezing or shortness of breath. 18 g 2   atorvastatin (LIPITOR) 10 MG tablet Take 1 tablet by mouth once daily 30 tablet 0   baclofen (LIORESAL) 10 MG tablet TAKE 1 TABLET BY MOUTH THREE  TIMES DAILY 90 tablet 0   blood glucose meter kit and supplies KIT Dispense based on patient and insurance preference. Use up to four times daily as directed. (FOR ICD-137). For QAC - HS accuchecks.  Dispense any approved glucometer with 1 month testing supplies. 1 each 1   Blood Glucose Monitoring Suppl (ACCU-CHEK GUIDE) w/Device KIT Use to check blood sugar 2 times daily 1 kit 0   buPROPion (WELLBUTRIN XL) 150 MG 24 hr tablet TAKE 1 TABLET BY MOUTH ONCE DAILY IN THE MORNING 90 tablet 0   carvedilol (COREG) 6.25 MG tablet TAKE 1 TABLET BY MOUTH TWICE DAILY WITH A MEAL 180 tablet 0   dapagliflozin propanediol (FARXIGA) 10 MG TABS tablet Take 1 tablet (10 mg total) by mouth daily. 90 tablet 3   glucose blood test strip Use  as instructed 100 each 12   letrozole (FEMARA) 2.5 MG tablet Take 1 tablet (2.5 mg total) by mouth daily. 90 tablet 3   Semaglutide, 1 MG/DOSE, (OZEMPIC, 1 MG/DOSE,) 4 MG/3ML SOPN Inject 1 mg into the skin once a week. 9 mL 3   [DISCONTINUED] prochlorperazine (COMPAZINE) 10 MG tablet Take 1 tablet (10 mg total) by mouth every 6 (six) hours as needed (Nausea or vomiting). 30 tablet 1   No current facility-administered medications on file prior to visit.    Allergies  Allergen Reactions   Aspirin Rash      PE Today's Vitals   01/29/23 1129  BP: 128/86  Pulse: 71  SpO2: 98%  Weight: 204 lb (92.5 kg)  Height: 5' 4.5" (1.638 m)   Body mass index is 34.48 kg/m.  Physical Exam Vitals reviewed. Exam conducted with a chaperone present.  Constitutional:      General: She is not in acute distress.    Appearance: Normal appearance.  HENT:     Head: Normocephalic and atraumatic.     Nose: Nose normal.  Eyes:     Extraocular Movements: Extraocular movements intact.     Conjunctiva/sclera: Conjunctivae normal.  Pulmonary:     Effort: Pulmonary effort is normal.  Genitourinary:    General: Normal vulva.     Exam position: Lithotomy position.     Vagina: Normal. No vaginal discharge.     Cervix: Normal. No cervical motion tenderness, discharge or lesion.     Uterus: Normal. Not enlarged and not tender.      Adnexa: Right adnexa normal and left adnexa normal.  Musculoskeletal:        General: Normal range of motion.     Cervical back: Normal range of motion.  Neurological:     General: No focal deficit present.     Mental Status: She is alert.  Psychiatric:        Mood and Affect: Mood normal.        Behavior: Behavior normal.     Colposcopy, ECC, and EMB Procedure Consented for procedure.  Speculum placed in vagina.  Acetic acid 3% was applied to cervix.  Unsatisfactory colposcopy, unable to visualized complete TZ.  Cervix was prepped with Betadine.  Cervical block was  performed with 10cc 1% lidocaine (Lot#: #[id number], Exp 09/2024). A single-toothed tenaculum was placed on the anterior lip of the cervix to stabilize it.  Biopsies taken Yes.   Location(s) - 04:00 +AW ECC performed. The 3 mm pipelle was introduced into the endometrial cavity without difficulty to a depth of 7cm, suction initiated and a minimal amount of tissue was obtained over 2 passes and sent to  pathology.  Specimens to pathology separately.  Monsel's applied to biopsy areas.  The instruments were removed from the patient's vagina.  Good hemostasis.  Minimal EBL. No complications.  Tolerated well with minimal discomfort.   Assessment and Plan:        Atypical glandular cells of undetermined significance (AGUS) on cervical Pap smear Assessment & Plan: Colposcopy and EMB indicated given AGC. Patient consented for procedure. Uncomplicated procedure completed. Reviewed postprocedure care instructions.   Orders: -     Surgical pathology -     Surgical pathology -     Surgical pathology -     Lidocaine HCl (PF)  Early menopause occurring in patient age younger than 76 years     Heidi Pascual Lasandra Beech, MD

## 2023-01-29 NOTE — Assessment & Plan Note (Signed)
Colposcopy and EMB indicated given AGC. Patient consented for procedure. Uncomplicated procedure completed. Reviewed postprocedure care instructions.

## 2023-01-29 NOTE — Patient Instructions (Signed)
It is common to have vaginal bleeding and cramping for up to 72 hours after your biopsy. Please call our office with heavy vaginal bleeding, severe abdominal pain or fever. Avoid intercourse, tampon use, douching and baths for 7 days to decrease the risk of infection.

## 2023-02-01 LAB — SURGICAL PATHOLOGY

## 2023-02-02 ENCOUNTER — Encounter: Payer: Self-pay | Admitting: Internal Medicine

## 2023-02-02 ENCOUNTER — Ambulatory Visit (INDEPENDENT_AMBULATORY_CARE_PROVIDER_SITE_OTHER): Payer: No Typology Code available for payment source | Admitting: Internal Medicine

## 2023-02-02 VITALS — BP 126/78 | HR 81 | Ht 64.5 in | Wt 203.0 lb

## 2023-02-02 DIAGNOSIS — Z7985 Long-term (current) use of injectable non-insulin antidiabetic drugs: Secondary | ICD-10-CM

## 2023-02-02 DIAGNOSIS — E119 Type 2 diabetes mellitus without complications: Secondary | ICD-10-CM

## 2023-02-02 DIAGNOSIS — Z7984 Long term (current) use of oral hypoglycemic drugs: Secondary | ICD-10-CM | POA: Diagnosis not present

## 2023-02-02 DIAGNOSIS — E785 Hyperlipidemia, unspecified: Secondary | ICD-10-CM

## 2023-02-02 LAB — POCT GLYCOSYLATED HEMOGLOBIN (HGB A1C): Hemoglobin A1C: 5.8 % — AB (ref 4.0–5.6)

## 2023-02-02 MED ORDER — OZEMPIC (1 MG/DOSE) 4 MG/3ML ~~LOC~~ SOPN: 1.0000 mg | PEN_INJECTOR | SUBCUTANEOUS | 3 refills | Status: DC

## 2023-02-02 MED ORDER — DAPAGLIFLOZIN PROPANEDIOL 10 MG PO TABS: 10.0000 mg | ORAL_TABLET | Freq: Every day | ORAL | 3 refills | Status: DC

## 2023-02-02 NOTE — Patient Instructions (Signed)
Continue Farxiga 10 mg daily  Continue Ozempic 1 mg weekly      HOW TO TREAT LOW BLOOD SUGARS (Blood sugar LESS THAN 70 MG/DL) Please follow the RULE OF 15 for the treatment of hypoglycemia treatment (when your (blood sugars are less than 70 mg/dL)   STEP 1: Take 15 grams of carbohydrates when your blood sugar is low, which includes:  3-4 GLUCOSE TABS  OR 3-4 OZ OF JUICE OR REGULAR SODA OR ONE TUBE OF GLUCOSE GEL    STEP 2: RECHECK blood sugar in 15 MINUTES STEP 3: If your blood sugar is still low at the 15 minute recheck --> then, go back to STEP 1 and treat AGAIN with another 15 grams of carbohydrates.

## 2023-02-02 NOTE — Progress Notes (Signed)
Name: Heidi Costa  MRN/ DOB: [id number], 1980-05-22   Age/ Sex: 42 y.o., female    PCP: Dorothyann Peng, MD   Reason for Endocrinology Evaluation: Type 2 Diabetes Mellitus     Date of Initial Endocrinology Visit: 09/23/2021    PATIENT IDENTIFIER: Heidi Costa is a 42 y.o. female with a past medical history of Hx of breast ca ( S/P lumpectomy 2021) and chemo. The patient presented for initial endocrinology clinic visit on 09/23/2021 for consultative assistance with her diabetes management.    HPI: Heidi Costa was    Diagnosed with DM 02/2021 when she presented to the ED with symptomatic hyperglycemia.  She was noted to be in DKA, with an elevated anion gap at 25 and elevated  beta hydroxybutyrate> mmol/L. She was initially discharged on insulin and Metformin .  Prior Medications tried/Intolerance: Metformin, Janumet Hemoglobin A1c has ranged from 6.1% in 2023, peaking at 10.5% in 2022.  On her initial visit to our clinic she had an A1c of 6.1%, we continued [place] and Ozempic     SUBJECTIVE:   During the last visit (08/07/2022): A1c 5.7%     Today (02/02/23): [name] is here for follow-up on diabetes management.  She checks her blood sugars 2 times weekly . The patient has not had hypoglycemic episodes since the last clinic visit.   She continues to follow-up with oncology, on chemotherapy Weight remains stable   Denies nausea, vomiting Denies  diarrhea but has occasional constipation  NO recent UTI   HOME DIABETES REGIMEN: Farxiga 10 mg Ozempic 1 mg weekly   Statin: Yes ACE-I/ARB: no Prior Diabetic Education:   METER DOWNLOAD SUMMARY: unable to download  Range 98-143  mg/dL    DIABETIC COMPLICATIONS: Microvascular complications:   Denies: CKD Last eye exam: Completed 03/2022  Macrovascular complications:   Denies: CAD, PVD, CVA   PAST HISTORY: Past Medical History:  Past Medical History:  Diagnosis Date   Abnormality of gait  08/02/2012   resolved per patient on 07/12/19   Anemia    iron taking in the past, not currently   Anxiety    Arthritis    spine- cerv.  & arm    Asthma    Bulging lumbar disc    Cancer (HCC)    Right Breast-starts chemo on 07/14/19   Chronic arthralgias of knees and hips 08/02/2012   Depression    Pt. took self off Celexa one month ago, states she did n't like how it made her feel .   Family history of cancer    GERD (gastroesophageal reflux disease)    diet controlled   Heart murmur    pt. been told that this is a fact, no echo or cardiac surveillance in the past    HLD (hyperlipidemia)    no meds, diet controlled   Hypertension    MRSA (methicillin resistant Staphylococcus aureus) 03/02/2005   Obesity    Spinal stenosis    Past Surgical History:  Past Surgical History:  Procedure Laterality Date   ANTERIOR CERVICAL DECOMP/DISCECTOMY FUSION N/A 10/27/2012   Procedure: ANTERIOR CERVICAL DECOMPRESSION/DISCECTOMY FUSION 1 LEVEL;  Surgeon: Reinaldo Meeker, MD;  Location: [facility];  Service: Neurosurgery;  Laterality: N/A;  ANTERIOR CERVICAL DECOMPRESSION/DISCECTOMY FUSION 1 LEVEL   BREAST LUMPECTOMY WITH RADIOACTIVE SEED AND SENTINEL LYMPH NODE BIOPSY Right 12/13/2019   Procedure: RIGHT BREAST LUMPECTOMY WITH RADIOACTIVE SEED AND RIGHT AXILLARY  SENTINEL LYMPH NODE BIOPSY, RIGHT AXILLARY NODE SEED GUIDED EXCISION, BLUE DYE  INJECTION;  Surgeon: Emelia Loron, MD;  Location: [facility];  Service: General;  Laterality: Right;  PEC BLOCK   BREAST REDUCTION SURGERY Bilateral 03/04/2021   Procedure: MAMMARY REDUCTION  (BREAST);  Surgeon: Contogiannis, Chales Abrahams, MD;  Location: [facility];  Service: Plastics;  Laterality: Bilateral;   CHALAZION EXCISION     L eye    PORTACATH PLACEMENT Left 07/13/2019   Procedure: INSERTION PORT-A-CATH WITH ULTRASOUND;  Surgeon: Emelia Loron, MD;  Location: [facility];  Service: General;  Laterality: Left;   VAGINAL  DELIVERY  2002   WISDOM TOOTH EXTRACTION      Social History:  reports that she has been smoking cigarettes. She started smoking about 12 years ago. She has a 10 pack-year smoking history. She has never been exposed to tobacco smoke. She has never used smokeless tobacco. She reports current alcohol use. She reports current drug use. Drug: Marijuana. Family History:  Family History  Problem Relation Age of Onset   Osteoarthritis Mother    Hypertension Mother    Diabetes Mother    Hypertension Father    Gout Father    Stroke Father 81       embolic   Diabetes Half-Brother    Hypertension Half-Brother    Heart failure Half-Brother 25   Kidney disease Half-Brother    Diabetes Paternal Grandfather    Cancer Paternal Grandfather 45       breast cancer     HOME MEDICATIONS: Allergies as of 02/02/2023       Reactions   Aspirin Rash        Medication List        Accurate as of February 02, 2023  3:01 PM. If you have any questions, ask your nurse or doctor.          Accu-Chek Guide w/Device Kit Use to check blood sugar 2 times daily   acetaminophen 500 MG tablet Commonly known as: TYLENOL Take 500-1,000 mg by mouth daily as needed for pain.   albuterol 108 (90 Base) MCG/ACT inhaler Commonly known as: VENTOLIN HFA Inhale 2 puffs into the lungs every 6 (six) hours as needed for wheezing or shortness of breath.   atorvastatin 10 MG tablet Commonly known as: LIPITOR Take 1 tablet by mouth once daily   baclofen 10 MG tablet Commonly known as: LIORESAL TAKE 1 TABLET BY MOUTH THREE TIMES DAILY   blood glucose meter kit and supplies Kit Dispense based on patient and insurance preference. Use up to four times daily as directed. (FOR ICD-137). For QAC - HS accuchecks.  Dispense any approved glucometer with 1 month testing supplies.   buPROPion 150 MG 24 hr tablet Commonly known as: WELLBUTRIN XL TAKE 1 TABLET BY MOUTH ONCE DAILY IN THE MORNING   carvedilol 6.25 MG  tablet Commonly known as: COREG TAKE 1 TABLET BY MOUTH TWICE DAILY WITH A MEAL   dapagliflozin propanediol 10 MG Tabs tablet Commonly known as: Farxiga Take 1 tablet (10 mg total) by mouth daily.   glucose blood test strip Use as instructed   letrozole 2.5 MG tablet Commonly known as: FEMARA Take 1 tablet (2.5 mg total) by mouth daily.   Ozempic (1 MG/DOSE) 4 MG/3ML Sopn Generic drug: Semaglutide (1 MG/DOSE) Inject 1 mg into the skin once a week.         ALLERGIES: Allergies  Allergen Reactions   Aspirin Rash     REVIEW OF SYSTEMS: A comprehensive ROS was conducted with the patient  and is negative except as per HPI    OBJECTIVE:   VITAL SIGNS: BP 126/78 (BP Location: Left Arm, Patient Position: Sitting, Cuff Size: Large)   Pulse 81   Ht 5' 4.5" (1.638 m)   Wt 203 lb (92.1 kg)   LMP  (LMP Unknown) Comment: June 2022  SpO2 99%   BMI 34.31 kg/m    PHYSICAL EXAM:  General: Pt appears well and is in NAD  Neck: General: Supple without adenopathy or carotid bruits. Thyroid: Thyroid size normal.  No goiter or nodules appreciated.  Lungs: Clear with good BS bilat with no r  Heart: RRR   Abdomen: soft, nontender  Extremities:  Lower extremities - No pretibial edema. No lesions.  Neuro: MS is good with appropriate affect, pt is alert and Ox3    DM foot exam: 02/02/2023  The skin of the feet is intact without sores or ulcerations. The pedal pulses are 2+ on right and 2+ on left. The sensation is intact to a screening 5.07, 10 gram monofilament bilaterally   DATA REVIEWED:  Lab Results  Component Value Date   HGBA1C 5.7 (A) 08/07/2022   HGBA1C 6.0 (H) 05/28/2022   HGBA1C 6.0 (H) 02/25/2022    Latest Reference Range & Units 09/03/22 16:46  Sodium 134 - 144 mmol/L 139  Potassium 3.5 - 5.2 mmol/L 4.3  Chloride 96 - 106 mmol/L 102  CO2 20 - 29 mmol/L 24  Glucose 70 - 99 mg/dL 80  BUN 6 - 24 mg/dL 10  Creatinine 1.19 - 1.47 mg/dL 8.29  Calcium 8.7 - 56.2  mg/dL 9.7  BUN/Creatinine Ratio 9 - 23  13  eGFR >59 mL/min/1.73 96  Alkaline Phosphatase 44 - 121 IU/L 113  Albumin 3.9 - 4.9 g/dL 4.3  Albumin/Globulin Ratio 1.2 - 2.2  1.6  AST 0 - 40 IU/L 15  ALT 0 - 32 IU/L 10  Total Protein 6.0 - 8.5 g/dL 7.0  Total Bilirubin 0.0 - 1.2 mg/dL 0.4    Old records , labs and images have been reviewed.    ASSESSMENT / PLAN / RECOMMENDATIONS:   1) Type 2 Diabetes Mellitus, Optimally controlled, Without complications - Most recent A1c of 5.8 %. Goal A1c < 7.0 %.    - A1c remains at goal  -She is not interested in any further weight loss at this time -No changes   MEDICATIONS: Continue Farxiga 10 mg daily Continue Ozempic 1 mg weekly  EDUCATION / INSTRUCTIONS: BG monitoring instructions: Patient is instructed to check her blood sugars 1 times a day, fasting. Call [facility] if: BG persistently < 70  I reviewed the Rule of 15 for the treatment of hypoglycemia in detail with the patient. Literature supplied.   2) Diabetic complications:  Eye: Does not have known diabetic retinopathy.  Neuro/ Feet: Does not have known diabetic peripheral neuropathy. Renal: Patient does not have known baseline CKD. She is not on an ACEI/ARB at present.   3) Dyslipidemia :  -She assures me compliance with atorvastatin -She typically gets this checked during her physical end of the year -Counseled about low-fat diet and increase fresh vegetables into a limited extent fresh fruits  Follow-up in 6 months  I spent 25 minutes preparing to see the patient by review of recent labs, imaging and procedures, obtaining and reviewing separately obtained history, communicating with the patient,ordering medications, tests or procedures, and documenting clinical information in the EHR including the differential Dx, treatment, and any further evaluation and other  management   Signed electronically by: Lyndle Herrlich, MD  [facility]   [facility] [address] [place], [place] [address] Phone: [phone] FAX: [phone]   CC: Dorothyann Peng, MD [address] [place] [address] Phone: [phone]  Fax: [phone]    Return to Endocrinology clinic as below: Future Appointments  Date Time Provider Department Center  02/11/2023  2:30 PM Serena Croissant, MD [facility] None  02/11/2023  3:15 PM [facility] None  03/04/2023  2:00 PM Dorothyann Peng, MD [facility] None

## 2023-02-07 ENCOUNTER — Other Ambulatory Visit: Payer: Self-pay | Admitting: Internal Medicine

## 2023-02-07 DIAGNOSIS — F320 Major depressive disorder, single episode, mild: Secondary | ICD-10-CM

## 2023-02-07 DIAGNOSIS — E782 Mixed hyperlipidemia: Secondary | ICD-10-CM

## 2023-02-08 ENCOUNTER — Encounter: Payer: Self-pay | Admitting: Obstetrics and Gynecology

## 2023-02-11 ENCOUNTER — Inpatient Hospital Stay (HOSPITAL_BASED_OUTPATIENT_CLINIC_OR_DEPARTMENT_OTHER): Payer: No Typology Code available for payment source | Admitting: Hematology and Oncology

## 2023-02-11 ENCOUNTER — Inpatient Hospital Stay: Payer: No Typology Code available for payment source

## 2023-02-11 ENCOUNTER — Inpatient Hospital Stay: Payer: No Typology Code available for payment source | Attending: Hematology and Oncology

## 2023-02-11 VITALS — BP 135/84 | HR 84 | Temp 97.2°F | Resp 18 | Ht 64.5 in | Wt 203.3 lb

## 2023-02-11 DIAGNOSIS — Z170 Estrogen receptor positive status [ER+]: Secondary | ICD-10-CM

## 2023-02-11 DIAGNOSIS — Z5111 Encounter for antineoplastic chemotherapy: Secondary | ICD-10-CM | POA: Insufficient documentation

## 2023-02-11 DIAGNOSIS — C50411 Malignant neoplasm of upper-outer quadrant of right female breast: Secondary | ICD-10-CM | POA: Diagnosis present

## 2023-02-11 DIAGNOSIS — Z923 Personal history of irradiation: Secondary | ICD-10-CM | POA: Insufficient documentation

## 2023-02-11 DIAGNOSIS — Z9221 Personal history of antineoplastic chemotherapy: Secondary | ICD-10-CM | POA: Insufficient documentation

## 2023-02-11 DIAGNOSIS — Z79811 Long term (current) use of aromatase inhibitors: Secondary | ICD-10-CM | POA: Diagnosis not present

## 2023-02-11 DIAGNOSIS — Z95828 Presence of other vascular implants and grafts: Secondary | ICD-10-CM

## 2023-02-11 MED ORDER — GOSERELIN ACETATE 3.6 MG ~~LOC~~ IMPL
3.6000 mg | DRUG_IMPLANT | Freq: Once | SUBCUTANEOUS | Status: AC
  Administered 2023-02-11: 3.6 mg via SUBCUTANEOUS
  Filled 2023-02-11: qty 3.6

## 2023-02-11 MED ORDER — LETROZOLE 2.5 MG PO TABS: 2.5000 mg | ORAL_TABLET | Freq: Every day | ORAL | 3 refills | Status: DC

## 2023-02-11 NOTE — Progress Notes (Signed)
Patient Care Team: Dorothyann Peng, MD as PCP - General (Internal Medicine) Donnelly Angelica, RN as Oncology Nurse Navigator Pershing Proud, RN as Oncology Nurse Navigator Emelia Loron, MD as Consulting Physician (General Surgery) Serena Croissant, MD as Consulting Physician (Hematology and Oncology) Dorothy Puffer, MD as Consulting Physician (Radiation Oncology)  DIAGNOSIS:  Encounter Diagnosis  Name Primary?   Malignant neoplasm of upper-outer quadrant of right breast in female, estrogen receptor positive (HCC) Yes    SUMMARY OF ONCOLOGIC HISTORY: Oncology History  Malignant neoplasm of upper-outer quadrant of right breast in female, estrogen receptor positive (HCC)  06/21/2019 Initial Diagnosis   Patient palpated a painful right breast lump x3 weeks. Mammogram showed a 2.0cm right breast mass at the 10 o'clock position, a 3.6cm enlarged right axillary lymph node, and a 0.5cm left breast mass. Biopsy showed, in the right breast and axilla, IDC, grade 3, HER-2 equivocal by IHC, + by FISH, ER+ 40%, PR - 0%, Ki67 80%, and in the left breast, fibrocystic changes, no malignancy.    07/04/2019 Cancer Staging   Staging form: Breast, AJCC 8th Edition - Clinical stage from 07/04/2019: Stage IIB (cT2, cN1(f), cM0, G3, ER+, PR-, HER2+) - Signed by Serena Croissant, MD on 07/05/2019   07/14/2019 - 10/07/2019 Chemotherapy   The patient had dexamethasone (DECADRON) 4 MG tablet, 4 mg (100 % of original dose 4 mg), Oral, Daily, 1 of 1 cycle, Start date: 07/05/2019, End date: 08/04/2019 Dose modification: 4 mg (original dose 4 mg, Cycle 0) palonosetron (ALOXI) injection 0.25 mg, 0.25 mg, Intravenous,  Once, 5 of 5 cycles Administration: 0.25 mg (07/14/2019), 0.25 mg (08/04/2019), 0.25 mg (10/05/2019), 0.25 mg (08/25/2019), 0.25 mg (09/14/2019) pegfilgrastim-jmdb (FULPHILA) injection 6 mg, 6 mg, Subcutaneous,  Once, 5 of 5 cycles Administration: 6 mg (07/17/2019), 6 mg (08/07/2019), 6 mg (10/07/2019), 6 mg (08/29/2019), 6 mg  (09/16/2019) CARBOplatin (PARAPLATIN) 700 mg in sodium chloride 0.9 % 250 mL chemo infusion, 700 mg (100 % of original dose 700 mg), Intravenous,  Once, 5 of 5 cycles Dose modification: 700 mg (original dose 700 mg, Cycle 1, Reason: Provider Judgment), 600 mg (original dose 700 mg, Cycle 2, Reason: Dose not tolerated) Administration: 700 mg (07/14/2019), 600 mg (08/04/2019), 600 mg (10/05/2019), 600 mg (08/25/2019), 600 mg (09/14/2019) DOCEtaxel (TAXOTERE) 170 mg in sodium chloride 0.9 % 250 mL chemo infusion, 75 mg/m2 = 170 mg, Intravenous,  Once, 5 of 5 cycles Dose modification: 65 mg/m2 (original dose 75 mg/m2, Cycle 2, Reason: Dose not tolerated) Administration: 170 mg (07/14/2019), 150 mg (08/04/2019), 150 mg (10/05/2019), 150 mg (08/25/2019), 150 mg (09/14/2019) fosaprepitant (EMEND) 150 mg in sodium chloride 0.9 % 145 mL IVPB, 150 mg, Intravenous,  Once, 5 of 5 cycles Administration: 150 mg (07/14/2019), 150 mg (08/04/2019), 150 mg (10/05/2019), 150 mg (08/25/2019), 150 mg (09/14/2019) pertuzumab (PERJETA) 840 mg in sodium chloride 0.9 % 250 mL chemo infusion, 840 mg, Intravenous, Once, 5 of 5 cycles Administration: 840 mg (07/14/2019), 420 mg (08/04/2019), 420 mg (10/05/2019), 420 mg (08/25/2019), 420 mg (09/14/2019) trastuzumab-dkst (OGIVRI) 900 mg in sodium chloride 0.9 % 250 mL chemo infusion, 903 mg, Intravenous,  Once, 5 of 5 cycles Administration: 900 mg (07/14/2019), 672 mg (08/04/2019), 672 mg (10/05/2019), 672 mg (08/25/2019), 672 mg (09/14/2019)  for chemotherapy treatment.    07/22/2019 Genetic Testing   Negative genetic testing:  No pathogenic variants detected on the [facility] Common Hereditary Cancers Panel. The report date is 07/22/2019.  The Common Hereditary Cancers Panel offered by [facility] includes  sequencing and/or deletion duplication testing of the following 48 genes: APC, ATM, AXIN2, BARD1, BMPR1A, BRCA1, BRCA2, BRIP1, CDH1, CDK4, CDKN2A (p14ARF), CDKN2A (p16INK4a), CHEK2, CTNNA1, DICER1, EPCAM  (Deletion/duplication testing only), GREM1 (promoter region deletion/duplication testing only), KIT, MEN1, MLH1, MSH2, MSH3, MSH6, MUTYH, NBN, NF1, NHTL1, PALB2, PDGFRA, PMS2, POLD1, POLE, PTEN, RAD50, RAD51C, RAD51D, RNF43, SDHB, SDHC, SDHD, SMAD4, SMARCA4. STK11, TP53, TSC1, TSC2, and VHL.  The following genes were evaluated for sequence changes only: SDHA and HOXB13 c.251G>A variant only.    11/20/2019 - 07/18/2020 Chemotherapy   [facility]       12/13/2019 Surgery   Right lumpectomy Dwain Sarna): IDC, 5 right axillary lymph nodes negative for carcinoma.    01/22/2020 - 03/12/2020 Radiation Therapy   Site/dose:   The patient initially received a dose of 50.4 Gy in 28 fractions to the breast using whole-breast tangent fields. This was delivered using a 3-D conformal technique. The patient then received a boost to the seroma. This delivered an additional 10 Gy in 5 fractions using a 3-field photon boost technique. The total dose was 60.4 Gy.     03/30/2020 -  Anti-estrogen oral therapy   Anastrozole switched to Letrozole Nov 2022     CHIEF COMPLIANT: Follow-up on letrozole and Zoladex  HISTORY OF PRESENT ILLNESS:   History of Present Illness   The patient, with a history of breast cancer, presents for a routine follow-up. She has been on Zoladex and letrozole . She denies any discomfort from the injections and reports that hot flashes have improved. Joint stiffness and achiness are not significant.  The patient has lost significant weight, going from 245 lbs to 203 lbs, which she attributes to Ozempic for prediabetes. She reports that her diabetes is under control and she feels better overall.  The patient is starting a new job and is concerned about missing work for monthly injections. She also mentions a recent Pap smear that showed precancerous cells, which are being monitored with yearly follow-ups.         ALLERGIES:  is allergic to aspirin.  MEDICATIONS:  Current Outpatient  Medications  Medication Sig Dispense Refill   acetaminophen (TYLENOL) 500 MG tablet Take 500-1,000 mg by mouth daily as needed for pain.     albuterol (VENTOLIN HFA) 108 (90 Base) MCG/ACT inhaler Inhale 2 puffs into the lungs every 6 (six) hours as needed for wheezing or shortness of breath. 18 g 2   atorvastatin (LIPITOR) 10 MG tablet Take 1 tablet by mouth once daily 30 tablet 0   baclofen (LIORESAL) 10 MG tablet TAKE 1 TABLET BY MOUTH THREE TIMES DAILY 90 tablet 0   blood glucose meter kit and supplies KIT Dispense based on patient and insurance preference. Use up to four times daily as directed. (FOR ICD-137). For QAC - HS accuchecks.  Dispense any approved glucometer with 1 month testing supplies. 1 each 1   Blood Glucose Monitoring Suppl (ACCU-CHEK GUIDE) w/Device KIT Use to check blood sugar 2 times daily 1 kit 0   buPROPion (WELLBUTRIN XL) 150 MG 24 hr tablet TAKE 1 TABLET BY MOUTH ONCE DAILY IN THE MORNING 90 tablet 0   carvedilol (COREG) 6.25 MG tablet TAKE 1 TABLET BY MOUTH TWICE DAILY WITH A MEAL 180 tablet 0   dapagliflozin propanediol (FARXIGA) 10 MG TABS tablet Take 1 tablet (10 mg total) by mouth daily. 90 tablet 3   glucose blood test strip Use as instructed 100 each 12   letrozole (FEMARA) 2.5 MG tablet Take  1 tablet (2.5 mg total) by mouth daily. 90 tablet 3   Semaglutide, 1 MG/DOSE, (OZEMPIC, 1 MG/DOSE,) 4 MG/3ML SOPN Inject 1 mg into the skin once a week. 9 mL 3   Current Facility-Administered Medications  Medication Dose Route Frequency Provider Last Rate Last Admin   lidocaine (PF) (XYLOCAINE) 1 % injection 10 mL  10 mL Intradermal Once        Facility-Administered Medications Ordered in Other Visits  Medication Dose Route Frequency Provider Last Rate Last Admin   goserelin (ZOLADEX) injection 3.6 mg  3.6 mg Subcutaneous Once Serena Croissant, MD        PHYSICAL EXAMINATION: ECOG PERFORMANCE STATUS: 1 - Symptomatic but completely ambulatory  Vitals:   02/11/23 1443   BP: 135/84  Pulse: 84  Resp: 18  Temp: (!) 97.2 F (36.2 C)  SpO2: 100%   Filed Weights   02/11/23 1443  Weight: 203 lb 4.8 oz (92.2 kg)    Physical Exam   MEASUREMENTS: WT- 203      (exam performed in the presence of a chaperone)  LABORATORY DATA:  I have reviewed the data as listed    Latest Ref Rng & Units 09/03/2022    4:46 PM 05/28/2022    4:27 PM 01/07/2022    4:49 PM  CMP  Glucose 70 - 99 mg/dL 80  98  413   BUN 6 - 24 mg/dL 10  10  11    Creatinine 0.57 - 1.00 mg/dL 2.44  0.10  2.72   Sodium 134 - 144 mmol/L 139  145  144   Potassium 3.5 - 5.2 mmol/L 4.3  4.2  4.2   Chloride 96 - 106 mmol/L 102  106  103   CO2 20 - 29 mmol/L 24  25  23    Calcium 8.7 - 10.2 mg/dL 9.7  9.4  9.8   Total Protein 6.0 - 8.5 g/dL 7.0   7.1   Total Bilirubin 0.0 - 1.2 mg/dL 0.4   0.4   Alkaline Phos 44 - 121 IU/L 113   110   AST 0 - 40 IU/L 15   16   ALT 0 - 32 IU/L 10   16     Lab Results  Component Value Date   WBC 7.5 01/07/2022   HGB 13.7 01/07/2022   HCT 39.8 01/07/2022   MCV 94 01/07/2022   PLT 351 01/07/2022   NEUTROABS 2.7 03/24/2021    ASSESSMENT & PLAN:  Malignant neoplasm of upper-outer quadrant of right breast in female, estrogen receptor positive (HCC) stage IIb right breast ER positive, HER2 positive breast cancer diagnosed in March 2021 status post neoadjuvant chemotherapy, lumpectomy, adjuvant radiation, maintenance Herceptin Perjeta, and antiestrogen therapy with Zoladex and letrozole.   Treatment plan: 1. Neoadjuvant chemotherapy with TCH Perjeta 5 cycles started 07/12/2019 completed 10/05/2019 followed by Herceptin Perjeta completed 07/18/2020 2. breast conserving surgery with sentinel lymph node study: 12/13/2019: Pathologic complete response, 0/5 lymph nodes negative 3. Adjuvant radiation therapy 01/23/2020- 03/12/2020 4.  Followed by antiestrogen therapy with anastrozole 1 mg daily started  03/30/2020 ----------------------------------------------------------------------------------------------------------------------------------------------------------- Current treatment:  Letrozole with Zoladex   ER and HER2 positive breast cancer: She will continue on letrozole and Zoladex.  She is due for an injection today. New right breast nodule: Mammogram and ultrasound 08/03/2022: Benign (seroma measuring 6 cm: Aspiration was attempted but no fluid obtained secondary to viscosity).  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  Therefore she cannot take time off with the injections.  I would like to request injections to be done on Saturdays.   She will return every 4 weeks for her Zoladex injections I will see the patient back in 1 year ------------------------------------- Assessment and Plan    Breast Cancer On Zoladex and Letrozole with no reported side effects. Patient has concerns about monthly injections due to new job. -Change Zoladex injections to Saturdays to accommodate patient's work schedule. -Send refill for Letrozole to [facility] on [address].  Prediabetes Patient reports weight loss and improved control with Ozempic. -Continue current management with Ozempic.  Abnormal Pap Smear Patient reports precancerous cells found on recent Pap smear, currently being monitored. -Continue with yearly follow-up as recommended by gynecologist.  General Health Maintenance / Followup Plans -Continue with current management and follow-up in one year.          No orders of the defined types were placed in this encounter.  The patient has a good understanding of the overall plan. she agrees with it. she will call with any problems that may develop before the next visit here. Total time spent: 30 mins including face to face time and time spent for planning, charting and co-ordination of care   Tamsen Meek, MD 02/11/23

## 2023-02-11 NOTE — Assessment & Plan Note (Signed)
stage IIb right breast ER positive, HER2 positive breast cancer diagnosed in March 2021 status post neoadjuvant chemotherapy, lumpectomy, adjuvant radiation, maintenance Herceptin Perjeta, and antiestrogen therapy with Zoladex and letrozole.   Treatment plan: 1. Neoadjuvant chemotherapy with TCH Perjeta 5 cycles started 07/12/2019 completed 10/05/2019 followed by Herceptin Perjeta completed 07/18/2020 2. breast conserving surgery with sentinel lymph node study: 12/13/2019: Pathologic complete response, 0/5 lymph nodes negative 3. Adjuvant radiation therapy 01/23/2020- 03/12/2020 4.  Followed by antiestrogen therapy with anastrozole 1 mg daily started 03/30/2020 ----------------------------------------------------------------------------------------------------------------------------------------------------------- Current treatment:  Letrozole with Zoladex   ER and HER2 positive breast cancer: She will continue on letrozole and Zoladex.  She is due for an injection today. New right breast nodule: Mammogram and ultrasound 08/03/2022: Benign (seroma measuring 6 cm: Aspiration was attempted but no fluid obtained secondary to viscosity).  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  Therefore she cannot take time off with the injections.  I would like to request injections to be done on Saturdays.   She will return every 4 weeks for her Zoladex injections I will see the patient back in 1 year

## 2023-03-04 ENCOUNTER — Encounter: Payer: BC Managed Care – PPO | Admitting: Internal Medicine

## 2023-03-05 ENCOUNTER — Ambulatory Visit: Payer: Self-pay | Admitting: Podiatry

## 2023-03-06 ENCOUNTER — Encounter: Payer: Self-pay | Admitting: Hematology and Oncology

## 2023-03-13 ENCOUNTER — Inpatient Hospital Stay: Payer: Self-pay | Attending: Hematology and Oncology

## 2023-03-13 VITALS — BP 157/96 | HR 75 | Temp 97.6°F | Resp 14

## 2023-03-13 DIAGNOSIS — C50411 Malignant neoplasm of upper-outer quadrant of right female breast: Secondary | ICD-10-CM | POA: Insufficient documentation

## 2023-03-13 DIAGNOSIS — Z170 Estrogen receptor positive status [ER+]: Secondary | ICD-10-CM | POA: Diagnosis not present

## 2023-03-13 DIAGNOSIS — Z5111 Encounter for antineoplastic chemotherapy: Secondary | ICD-10-CM | POA: Diagnosis present

## 2023-03-13 DIAGNOSIS — Z95828 Presence of other vascular implants and grafts: Secondary | ICD-10-CM

## 2023-03-13 MED ORDER — GOSERELIN ACETATE 3.6 MG ~~LOC~~ IMPL
3.6000 mg | DRUG_IMPLANT | Freq: Once | SUBCUTANEOUS | Status: AC
  Administered 2023-03-13: 3.6 mg via SUBCUTANEOUS
  Filled 2023-03-13: qty 3.6

## 2023-03-13 NOTE — Patient Instructions (Signed)
Goserelin Implant What is this medication? GOSERELIN (GOE se rel in) treats prostate cancer and breast cancer. It works by decreasing levels of the hormones testosterone and estrogen in the body. This prevents prostate and breast cancer cells from spreading or growing. It may also be used to treat endometriosis. This is a condition where the tissue that lines the uterus grows outside the uterus. It works by decreasing the amount of estrogen your body makes, which reduces heavy bleeding and pain. It can also be used to help thin the lining of the uterus before a surgery used to prevent or reduce heavy periods. This medicine may be used for other purposes; ask your health care provider or pharmacist if you have questions. COMMON BRAND NAME(S): Zoladex, Zoladex 3-Month What should I tell my care team before I take this medication? They need to know if you have any of these conditions: Bone problems Diabetes Heart disease History of irregular heartbeat or rhythm An unusual or allergic reaction to goserelin, other medications, foods, dyes, or preservatives Pregnant or trying to get pregnant Breastfeeding How should I use this medication? This medication is injected under the skin. It is given by your care team in a hospital or clinic setting. Talk to your care team about the use of this medication in children. Special care may be needed. Overdosage: If you think you have taken too much of this medicine contact a poison control center or emergency room at once. NOTE: This medicine is only for you. Do not share this medicine with others. What if I miss a dose? Keep appointments for follow-up doses. It is important not to miss your dose. Call your care team if you are unable to keep an appointment. What may interact with this medication? Do not take this medication with any of the following: Cisapride Dronedarone Pimozide Thioridazine This medication may also interact with the following: Other  medications that cause heart rhythm changes This list may not describe all possible interactions. Give your health care provider a list of all the medicines, herbs, non-prescription drugs, or dietary supplements you use. Also tell them if you smoke, drink alcohol, or use illegal drugs. Some items may interact with your medicine. What should I watch for while using this medication? Visit your care team for regular checks on your progress. Your symptoms may appear to get worse during the first weeks of this therapy. Tell your care team if your symptoms do not start to get better or if they get worse after this time. Using this medication for a long time may weaken your bones. If you smoke or frequently drink alcohol you may increase your risk of bone loss. A family history of osteoporosis, chronic use of medications for seizures (convulsions), or corticosteroids can also increase your risk of bone loss. The risk of bone fractures may be increased. Talk to your care team about your bone health. This medication may increase blood sugar. The risk may be higher in patients who already have diabetes. Ask your care team what you can do to lower your risk of diabetes while taking this medication. This medication should stop regular monthly menstruation in women. Tell your care team if you continue to menstruate. Talk to your care team if you wish to become pregnant or think you might be pregnant. This medication can cause serious birth defects if taken during pregnancy or for 12 weeks after stopping treatment. Talk to your care team about reliable forms of contraception. Do not breastfeed while taking this   medication. This medication may cause infertility. Talk to your care team if you are concerned about your fertility. What side effects may I notice from receiving this medication? Side effects that you should report to your care team as soon as possible: Allergic reactions--skin rash, itching, hives, swelling  of the face, lips, tongue, or throat Change in the amount of urine Heart attack--pain or tightness in the chest, shoulders, arms, or jaw, nausea, shortness of breath, cold or clammy skin, feeling faint or lightheaded Heart rhythm changes--fast or irregular heartbeat, dizziness, feeling faint or lightheaded, chest pain, trouble breathing High blood sugar (hyperglycemia)--increased thirst or amount of urine, unusual weakness or fatigue, blurry vision High calcium level--increased thirst or amount of urine, nausea, vomiting, confusion, unusual weakness or fatigue, bone pain Pain, redness, irritation, or bruising at the injection site Severe back pain, numbness or weakness of the hands, arms, legs, or feet, loss of coordination, loss of bowel or bladder control Stroke--sudden numbness or weakness of the face, arm, or leg, trouble speaking, confusion, trouble walking, loss of balance or coordination, dizziness, severe headache, change in vision Swelling and pain of the tumor site or lymph nodes Trouble passing urine Side effects that usually do not require medical attention (report to your care team if they continue or are bothersome): Change in sex drive or performance Headache Hot flashes Rapid or extreme change in emotion or mood Sweating Swelling of the ankles, hands, or feet Unusual vaginal discharge, itching, or odor This list may not describe all possible side effects. Call your doctor for medical advice about side effects. You may report side effects to FDA at 1-800-FDA-1088. Where should I keep my medication? This medication is given in a hospital or clinic. It will not be stored at home. NOTE: This sheet is a summary. It may not cover all possible information. If you have questions about this medicine, talk to your doctor, pharmacist, or health care provider.  2024 Elsevier/Gold Standard (2021-08-07 00:00:00)  

## 2023-03-23 ENCOUNTER — Encounter: Payer: No Typology Code available for payment source | Admitting: Internal Medicine

## 2023-03-23 DIAGNOSIS — I10 Essential (primary) hypertension: Secondary | ICD-10-CM

## 2023-03-23 DIAGNOSIS — E1169 Type 2 diabetes mellitus with other specified complication: Secondary | ICD-10-CM

## 2023-03-23 DIAGNOSIS — Z0000 Encounter for general adult medical examination without abnormal findings: Secondary | ICD-10-CM

## 2023-03-23 DIAGNOSIS — E559 Vitamin D deficiency, unspecified: Secondary | ICD-10-CM

## 2023-04-17 ENCOUNTER — Encounter: Payer: Self-pay | Admitting: Hematology and Oncology

## 2023-04-17 ENCOUNTER — Inpatient Hospital Stay: Payer: Commercial Managed Care - PPO | Attending: Hematology and Oncology

## 2023-04-17 VITALS — BP 131/101 | HR 93 | Temp 98.0°F | Resp 18

## 2023-04-17 DIAGNOSIS — Z1722 Progesterone receptor negative status: Secondary | ICD-10-CM | POA: Diagnosis not present

## 2023-04-17 DIAGNOSIS — Z170 Estrogen receptor positive status [ER+]: Secondary | ICD-10-CM | POA: Diagnosis not present

## 2023-04-17 DIAGNOSIS — Z1731 Human epidermal growth factor receptor 2 positive status: Secondary | ICD-10-CM | POA: Insufficient documentation

## 2023-04-17 DIAGNOSIS — Z5111 Encounter for antineoplastic chemotherapy: Secondary | ICD-10-CM | POA: Insufficient documentation

## 2023-04-17 DIAGNOSIS — C50411 Malignant neoplasm of upper-outer quadrant of right female breast: Secondary | ICD-10-CM | POA: Insufficient documentation

## 2023-04-17 DIAGNOSIS — Z95828 Presence of other vascular implants and grafts: Secondary | ICD-10-CM

## 2023-04-17 DIAGNOSIS — Z79811 Long term (current) use of aromatase inhibitors: Secondary | ICD-10-CM | POA: Diagnosis not present

## 2023-04-17 MED ORDER — GOSERELIN ACETATE 3.6 MG ~~LOC~~ IMPL
3.6000 mg | DRUG_IMPLANT | Freq: Once | SUBCUTANEOUS | Status: AC
Start: 2023-04-17 — End: 2023-04-17
  Administered 2023-04-17: 3.6 mg via SUBCUTANEOUS
  Filled 2023-04-17: qty 3.6

## 2023-04-22 ENCOUNTER — Encounter: Payer: Self-pay | Admitting: Hematology and Oncology

## 2023-04-30 ENCOUNTER — Other Ambulatory Visit: Payer: Self-pay | Admitting: Internal Medicine

## 2023-04-30 ENCOUNTER — Other Ambulatory Visit: Payer: Self-pay | Admitting: Hematology and Oncology

## 2023-04-30 DIAGNOSIS — E782 Mixed hyperlipidemia: Secondary | ICD-10-CM

## 2023-04-30 DIAGNOSIS — F320 Major depressive disorder, single episode, mild: Secondary | ICD-10-CM

## 2023-05-04 ENCOUNTER — Encounter: Payer: Self-pay | Admitting: Hematology and Oncology

## 2023-05-12 ENCOUNTER — Encounter: Payer: Self-pay | Admitting: Hematology and Oncology

## 2023-05-14 ENCOUNTER — Other Ambulatory Visit: Payer: Self-pay | Admitting: Pharmacist

## 2023-05-15 ENCOUNTER — Inpatient Hospital Stay: Payer: Self-pay | Attending: Hematology and Oncology

## 2023-05-15 VITALS — BP 121/81 | HR 85 | Temp 97.1°F | Resp 18

## 2023-05-15 DIAGNOSIS — C50411 Malignant neoplasm of upper-outer quadrant of right female breast: Secondary | ICD-10-CM | POA: Insufficient documentation

## 2023-05-15 DIAGNOSIS — Z95828 Presence of other vascular implants and grafts: Secondary | ICD-10-CM

## 2023-05-15 DIAGNOSIS — Z5111 Encounter for antineoplastic chemotherapy: Secondary | ICD-10-CM | POA: Insufficient documentation

## 2023-05-15 DIAGNOSIS — Z170 Estrogen receptor positive status [ER+]: Secondary | ICD-10-CM

## 2023-05-15 MED ORDER — GOSERELIN ACETATE 3.6 MG ~~LOC~~ IMPL
3.6000 mg | DRUG_IMPLANT | Freq: Once | SUBCUTANEOUS | Status: AC
Start: 2023-05-15 — End: 2023-05-15
  Administered 2023-05-15: 3.6 mg via SUBCUTANEOUS
  Filled 2023-05-15: qty 3.6

## 2023-05-15 NOTE — Patient Instructions (Signed)
 Goserelin Implant What is this medication? GOSERELIN (GOE se rel in) treats prostate cancer and breast cancer. It works by decreasing levels of the hormones testosterone and estrogen in the body. This prevents prostate and breast cancer cells from spreading or growing. It may also be used to treat endometriosis. This is a condition where the tissue that lines the uterus grows outside the uterus. It works by decreasing the amount of estrogen your body makes, which reduces heavy bleeding and pain. It can also be used to help thin the lining of the uterus before a surgery used to prevent or reduce heavy periods. This medicine may be used for other purposes; ask your health care provider or pharmacist if you have questions. COMMON BRAND NAME(S): Zoladex, Zoladex 76-Month What should I tell my care team before I take this medication? They need to know if you have any of these conditions: Bone problems Diabetes Heart disease History of irregular heartbeat or rhythm An unusual or allergic reaction to goserelin, other medications, foods, dyes, or preservatives Pregnant or trying to get pregnant Breastfeeding How should I use this medication? This medication is injected under the skin. It is given by your care team in a hospital or clinic setting. Talk to your care team about the use of this medication in children. Special care may be needed. Overdosage: If you think you have taken too much of this medicine contact a poison control center or emergency room at once. NOTE: This medicine is only for you. Do not share this medicine with others. What if I miss a dose? Keep appointments for follow-up doses. It is important not to miss your dose. Call your care team if you are unable to keep an appointment. What may interact with this medication? Do not take this medication with any of the following: Cisapride Dronedarone Pimozide Thioridazine This medication may also interact with the following: Other  medications that cause heart rhythm changes This list may not describe all possible interactions. Give your health care provider a list of all the medicines, herbs, non-prescription drugs, or dietary supplements you use. Also tell them if you smoke, drink alcohol, or use illegal drugs. Some items may interact with your medicine. What should I watch for while using this medication? Visit your care team for regular checks on your progress. Your symptoms may appear to get worse during the first weeks of this therapy. Tell your care team if your symptoms do not start to get better or if they get worse after this time. Using this medication for a long time may weaken your bones. If you smoke or frequently drink alcohol you may increase your risk of bone loss. A family history of osteoporosis, chronic use of medications for seizures (convulsions), or corticosteroids can also increase your risk of bone loss. The risk of bone fractures may be increased. Talk to your care team about your bone health. This medication may increase blood sugar. The risk may be higher in patients who already have diabetes. Ask your care team what you can do to lower your risk of diabetes while taking this medication. This medication should stop regular monthly menstruation in women. Tell your care team if you continue to menstruate. Talk to your care team if you wish to become pregnant or think you might be pregnant. This medication can cause serious birth defects if taken during pregnancy or for 12 weeks after stopping treatment. Talk to your care team about reliable forms of contraception. Do not breastfeed while taking this  medication. This medication may cause infertility. Talk to your care team if you are concerned about your fertility. What side effects may I notice from receiving this medication? Side effects that you should report to your care team as soon as possible: Allergic reactions--skin rash, itching, hives, swelling  of the face, lips, tongue, or throat Change in the amount of urine Heart attack--pain or tightness in the chest, shoulders, arms, or jaw, nausea, shortness of breath, cold or clammy skin, feeling faint or lightheaded Heart rhythm changes--fast or irregular heartbeat, dizziness, feeling faint or lightheaded, chest pain, trouble breathing High blood sugar (hyperglycemia)--increased thirst or amount of urine, unusual weakness or fatigue, blurry vision High calcium level--increased thirst or amount of urine, nausea, vomiting, confusion, unusual weakness or fatigue, bone pain Pain, redness, irritation, or bruising at the injection site Severe back pain, numbness or weakness of the hands, arms, legs, or feet, loss of coordination, loss of bowel or bladder control Stroke--sudden numbness or weakness of the face, arm, or leg, trouble speaking, confusion, trouble walking, loss of balance or coordination, dizziness, severe headache, change in vision Swelling and pain of the tumor site or lymph nodes Trouble passing urine Side effects that usually do not require medical attention (report to your care team if they continue or are bothersome): Change in sex drive or performance Headache Hot flashes Rapid or extreme change in emotion or mood Sweating Swelling of the ankles, hands, or feet Unusual vaginal discharge, itching, or odor This list may not describe all possible side effects. Call your doctor for medical advice about side effects. You may report side effects to FDA at 1-800-FDA-1088. Where should I keep my medication? This medication is given in a hospital or clinic. It will not be stored at home. NOTE: This sheet is a summary. It may not cover all possible information. If you have questions about this medicine, talk to your doctor, pharmacist, or health care provider.  2024 Elsevier/Gold Standard (2021-08-07 00:00:00)

## 2023-06-12 ENCOUNTER — Inpatient Hospital Stay: Payer: Self-pay | Attending: Hematology and Oncology

## 2023-06-12 VITALS — BP 141/92 | HR 87 | Temp 97.8°F | Resp 18

## 2023-06-12 DIAGNOSIS — Z170 Estrogen receptor positive status [ER+]: Secondary | ICD-10-CM | POA: Insufficient documentation

## 2023-06-12 DIAGNOSIS — C50411 Malignant neoplasm of upper-outer quadrant of right female breast: Secondary | ICD-10-CM | POA: Insufficient documentation

## 2023-06-12 DIAGNOSIS — Z5111 Encounter for antineoplastic chemotherapy: Secondary | ICD-10-CM | POA: Insufficient documentation

## 2023-06-12 DIAGNOSIS — Z95828 Presence of other vascular implants and grafts: Secondary | ICD-10-CM

## 2023-06-12 MED ORDER — GOSERELIN ACETATE 3.6 MG ~~LOC~~ IMPL
3.6000 mg | DRUG_IMPLANT | Freq: Once | SUBCUTANEOUS | Status: AC
Start: 2023-06-12 — End: 2023-06-12
  Administered 2023-06-12: 3.6 mg via SUBCUTANEOUS
  Filled 2023-06-12: qty 3.6

## 2023-07-06 ENCOUNTER — Encounter: Payer: Self-pay | Admitting: Internal Medicine

## 2023-07-17 ENCOUNTER — Inpatient Hospital Stay: Payer: Self-pay

## 2023-07-19 ENCOUNTER — Inpatient Hospital Stay: Payer: Self-pay | Attending: Hematology and Oncology

## 2023-07-19 DIAGNOSIS — Z5111 Encounter for antineoplastic chemotherapy: Secondary | ICD-10-CM | POA: Insufficient documentation

## 2023-07-19 DIAGNOSIS — Z170 Estrogen receptor positive status [ER+]: Secondary | ICD-10-CM | POA: Insufficient documentation

## 2023-07-19 DIAGNOSIS — Z95828 Presence of other vascular implants and grafts: Secondary | ICD-10-CM

## 2023-07-19 DIAGNOSIS — C50411 Malignant neoplasm of upper-outer quadrant of right female breast: Secondary | ICD-10-CM | POA: Diagnosis not present

## 2023-07-19 MED ORDER — GOSERELIN ACETATE 3.6 MG ~~LOC~~ IMPL
3.6000 mg | DRUG_IMPLANT | Freq: Once | SUBCUTANEOUS | Status: AC
  Administered 2023-07-19: 3.6 mg via SUBCUTANEOUS
  Filled 2023-07-19 (×2): qty 3.6

## 2023-08-04 ENCOUNTER — Ambulatory Visit: Payer: No Typology Code available for payment source | Admitting: Internal Medicine

## 2023-08-06 ENCOUNTER — Ambulatory Visit: Admitting: Internal Medicine

## 2023-08-10 ENCOUNTER — Telehealth: Payer: Self-pay

## 2023-08-10 ENCOUNTER — Emergency Department (HOSPITAL_COMMUNITY)
Admission: EM | Admit: 2023-08-10 | Discharge: 2023-08-10 | Disposition: A | Discharge status: Home / Self Care | Payer: Self-pay | Attending: Student | Admitting: Student

## 2023-08-10 ENCOUNTER — Encounter (HOSPITAL_COMMUNITY): Payer: Self-pay | Admitting: Pharmacy Technician

## 2023-08-10 ENCOUNTER — Other Ambulatory Visit: Payer: Self-pay

## 2023-08-10 ENCOUNTER — Encounter: Payer: Self-pay | Admitting: Hematology and Oncology

## 2023-08-10 DIAGNOSIS — Z79899 Other long term (current) drug therapy: Secondary | ICD-10-CM | POA: Insufficient documentation

## 2023-08-10 DIAGNOSIS — R739 Hyperglycemia, unspecified: Secondary | ICD-10-CM

## 2023-08-10 DIAGNOSIS — F17210 Nicotine dependence, cigarettes, uncomplicated: Secondary | ICD-10-CM | POA: Insufficient documentation

## 2023-08-10 DIAGNOSIS — J45909 Unspecified asthma, uncomplicated: Secondary | ICD-10-CM | POA: Insufficient documentation

## 2023-08-10 DIAGNOSIS — Z794 Long term (current) use of insulin: Secondary | ICD-10-CM | POA: Insufficient documentation

## 2023-08-10 DIAGNOSIS — I10 Essential (primary) hypertension: Secondary | ICD-10-CM | POA: Insufficient documentation

## 2023-08-10 DIAGNOSIS — Z853 Personal history of malignant neoplasm of breast: Secondary | ICD-10-CM | POA: Insufficient documentation

## 2023-08-10 DIAGNOSIS — E1165 Type 2 diabetes mellitus with hyperglycemia: Secondary | ICD-10-CM | POA: Insufficient documentation

## 2023-08-10 LAB — I-STAT VENOUS BLOOD GAS, ED
Acid-Base Excess: 2 mmol/L (ref 0.0–2.0)
Bicarbonate: 27.2 mmol/L (ref 20.0–28.0)
Calcium, Ion: 1.16 mmol/L (ref 1.15–1.40)
HCT: 39 % (ref 36.0–46.0)
Hemoglobin: 13.3 g/dL (ref 12.0–15.0)
O2 Saturation: 66 %
Potassium: 4.4 mmol/L (ref 3.5–5.1)
Sodium: 134 mmol/L — ABNORMAL LOW (ref 135–145)
TCO2: 28 mmol/L (ref 22–32)
pCO2, Ven: 43.3 mmHg — ABNORMAL LOW (ref 44–60)
pH, Ven: 7.406 (ref 7.25–7.43)
pO2, Ven: 34 mmHg (ref 32–45)

## 2023-08-10 LAB — CBC
HCT: 38.6 % (ref 36.0–46.0)
Hemoglobin: 13.4 g/dL (ref 12.0–15.0)
MCH: 32.5 pg (ref 26.0–34.0)
MCHC: 34.7 g/dL (ref 30.0–36.0)
MCV: 93.7 fL (ref 80.0–100.0)
Platelets: 328 10*3/uL (ref 150–400)
RBC: 4.12 MIL/uL (ref 3.87–5.11)
RDW: 12.3 % (ref 11.5–15.5)
WBC: 6.7 10*3/uL (ref 4.0–10.5)
nRBC: 0 % (ref 0.0–0.2)

## 2023-08-10 LAB — URINALYSIS, ROUTINE W REFLEX MICROSCOPIC
Bilirubin Urine: NEGATIVE
Glucose, UA: >=500 mg/dL — AB
Hgb urine dipstick: NEGATIVE
Ketones, ur: NEGATIVE mg/dL
Leukocytes,Ua: NEGATIVE
Nitrite: NEGATIVE
Protein, ur: NEGATIVE mg/dL
Specific Gravity, Urine: 1.036 — ABNORMAL HIGH (ref 1.005–1.030)
pH: 5 (ref 5.0–8.0)

## 2023-08-10 LAB — COMPREHENSIVE METABOLIC PANEL WITH GFR
ALT: 18 U/L (ref 0–44)
AST: 20 U/L (ref 15–41)
Albumin: 3.8 g/dL (ref 3.5–5.0)
Alkaline Phosphatase: 98 U/L (ref 38–126)
Anion gap: 11 (ref 5–15)
BUN: 11 mg/dL (ref 6–20)
CO2: 24 mmol/L (ref 22–32)
Calcium: 9.3 mg/dL (ref 8.9–10.3)
Chloride: 98 mmol/L (ref 98–111)
Creatinine, Ser: 0.91 mg/dL (ref 0.44–1.00)
GFR, Estimated: >60 mL/min (ref >60)
Glucose, Bld: 550 mg/dL (ref 70–99)
Potassium: 4.7 mmol/L (ref 3.5–5.1)
Sodium: 133 mmol/L — ABNORMAL LOW (ref 135–145)
Total Bilirubin: 0.7 mg/dL (ref 0.0–1.2)
Total Protein: 6.9 g/dL (ref 6.5–8.1)

## 2023-08-10 LAB — I-STAT CHEM 8, ED
BUN: 12 mg/dL (ref 6–20)
Calcium, Ion: 1.14 mmol/L — ABNORMAL LOW (ref 1.15–1.40)
Chloride: 100 mmol/L (ref 98–111)
Creatinine, Ser: 0.9 mg/dL (ref 0.44–1.00)
Glucose, Bld: 553 mg/dL (ref 70–99)
HCT: 41 % (ref 36.0–46.0)
Hemoglobin: 13.9 g/dL (ref 12.0–15.0)
Potassium: 4.6 mmol/L (ref 3.5–5.1)
Sodium: 135 mmol/L (ref 135–145)
TCO2: 25 mmol/L (ref 22–32)

## 2023-08-10 LAB — CBG MONITORING, ED
Glucose-Capillary: 556 mg/dL (ref 70–99)
Glucose-Capillary: 402 mg/dL — ABNORMAL HIGH (ref 70–99)
Glucose-Capillary: 304 mg/dL — ABNORMAL HIGH (ref 70–99)

## 2023-08-10 MED ORDER — INSULIN ASPART 100 UNIT/ML IJ SOLN
10.0000 [IU] | Freq: Once | INTRAMUSCULAR | Status: AC
Start: 2023-08-10 — End: 2023-08-10
  Administered 2023-08-10: 10 [IU] via SUBCUTANEOUS

## 2023-08-10 MED ORDER — INSULIN PEN NEEDLE 32G X 4 MM MISC
1.0000 | Freq: Two times a day (BID) | 3 refills | Status: AC
  Filled 2023-08-10: qty 100, 30d supply, fill #0

## 2023-08-10 MED ORDER — BLOOD GLUCOSE MONITOR KIT
PACK | 1 refills | Status: AC
  Filled 2023-08-10: qty 1, fill #0

## 2023-08-10 MED ORDER — GLUCOSE BLOOD VI STRP
ORAL_STRIP | 12 refills | Status: AC
  Filled 2023-08-10: qty 100, 33d supply, fill #0

## 2023-08-10 MED ORDER — DAPAGLIFLOZIN PROPANEDIOL 10 MG PO TABS
10.0000 mg | ORAL_TABLET | Freq: Every day | ORAL | 3 refills | Status: DC
  Filled 2023-08-10: qty 30, 30d supply, fill #0

## 2023-08-10 MED ORDER — NOVOLIN N FLEXPEN 100 UNIT/ML ~~LOC~~ SUPN
10.0000 [IU] | PEN_INJECTOR | Freq: Two times a day (BID) | SUBCUTANEOUS | 11 refills | Status: DC
Start: 2023-08-10 — End: 2023-08-10

## 2023-08-10 MED ORDER — INSULIN PEN NEEDLE 32G X 4 MM MISC: 1.0000 | Freq: Two times a day (BID) | 3 refills | Status: DC

## 2023-08-10 MED ORDER — NOVOLIN N FLEXPEN 100 UNIT/ML ~~LOC~~ SUPN
10.0000 [IU] | PEN_INJECTOR | Freq: Two times a day (BID) | SUBCUTANEOUS | 11 refills | Status: DC
  Filled 2023-08-10: qty 6, 30d supply, fill #0

## 2023-08-10 MED ORDER — INSULIN ASPART 100 UNIT/ML IJ SOLN
10.0000 [IU] | Freq: Once | INTRAMUSCULAR | Status: AC
  Administered 2023-08-10: 10 [IU] via SUBCUTANEOUS

## 2023-08-10 MED ORDER — OZEMPIC (1 MG/DOSE) 4 MG/3ML ~~LOC~~ SOPN
1.0000 mg | PEN_INJECTOR | SUBCUTANEOUS | 3 refills | Status: DC
  Filled 2023-08-10: qty 3, 28d supply, fill #0

## 2023-08-10 MED ORDER — LACTATED RINGERS IV BOLUS
1000.0000 mL | Freq: Once | INTRAVENOUS | Status: AC
  Administered 2023-08-10: 1000 mL via INTRAVENOUS

## 2023-08-10 NOTE — ED Notes (Signed)
 Cm 8 result given to crystal b.rn by at

## 2023-08-10 NOTE — ED Provider Notes (Signed)
 [place] EMERGENCY DEPARTMENT AT [facility] Provider Note  CSN: [id number] Arrival date & time: 08/10/23 1423  Chief Complaint(s) Hyperglycemia  HPI Heidi Costa is a 43 y.o. female with PMH of breast cancer status post chemo, T2DM on Farxiga  and Ozempic , HTN, spinal stenosis, anemia who presents emergency room for evaluation of hyperglycemia.  Endorses polyuria and polydipsia.  States she has been out of her medication for 3 months because she has not had insurance but her insurance kicks in next week.  States that her sugars were reading high on her glucose monitor today and she comes to the ER for further evaluation.  Denies nausea, vomiting, chest pain or shortness of breath or other systemic symptoms.   Past Medical History Past Medical History:  Diagnosis Date   Abnormality of gait 08/02/2012   resolved per patient on 07/12/19   Anemia    iron taking in the past, not currently   Anxiety    Arthritis    spine- cerv.  & arm    Asthma    Bulging lumbar disc    Cancer (HCC)    Right Breast-starts chemo on 07/14/19   Chronic arthralgias of knees and hips 08/02/2012   Depression    Pt. took self off Celexa one month ago, states she did n't like how it made her feel .   Family history of cancer    GERD (gastroesophageal reflux disease)    diet controlled   Heart murmur    pt. been told that this is a fact, no echo or cardiac surveillance in the past    HLD (hyperlipidemia)    no meds, diet controlled   Hypertension    MRSA (methicillin resistant Staphylococcus aureus) 03/02/2005   Obesity    Spinal stenosis    Patient Active Problem List   Diagnosis Date Noted   Early menopause occurring in patient age younger than 72 years 01/29/2023   Atypical glandular cells of undetermined significance (AGUS) on cervical Pap smear 01/29/2023   Diabetes mellitus due to underlying condition with ketoacidosis without coma, with long-term current use of insulin  (HCC)  03/08/2022   Mixed hyperlipidemia 03/08/2022   Vitamin D  deficiency disease 03/08/2022   Acute bilateral low back pain without sciatica 03/08/2022   Type 2 diabetes mellitus without complication, without long-term current use of insulin  (HCC) 09/23/2021   Dyslipidemia 09/23/2021   DKA (diabetic ketoacidosis) (HCC) 03/22/2021   Intractable nausea and vomiting 03/22/2021   AKI (acute kidney injury) (HCC) 03/22/2021   Breast cancer (HCC) 03/22/2021   Thrombocytosis 03/22/2021   DKA, type 2 (HCC) 03/22/2021   Mild persistent asthma without complication 10/17/2019   Port-A-Cath in place 08/04/2019   Genetic testing 07/24/2019   Family history of cancer    Dysphagia 06/23/2019   Malignant neoplasm of upper-outer quadrant of right breast in female, estrogen receptor positive (HCC) 06/21/2019   Tobacco consumption 06/06/2019   Breast mass, right 06/06/2019   Sensation of change in body temperature 06/06/2019   Neuropathic pain 06/06/2019   Bite, insect 10/19/2016   Swelling of eyelid, left 10/19/2016   Essential hypertension 08/10/2016   Disturbance of skin sensation 05/08/2013   Cervical spondylosis with myelopathy 05/08/2013   Abnormality of gait 08/02/2012   Chronic arthralgias of knees and hips 08/02/2012   Home Medication(s) Prior to Admission medications   Medication Sig Start Date End Date Taking? Authorizing Provider  acetaminophen  (TYLENOL ) 500 MG tablet Take 500-1,000 mg by mouth daily as needed for pain.  [provider]  albuterol  (VENTOLIN  HFA) 108 (90 Base) MCG/ACT inhaler Inhale 2 puffs into the lungs every 6 (six) hours as needed for wheezing or shortness of breath. 09/01/19   Elyce Hams, Marguerita Shih, MD  atorvastatin  (LIPITOR) 10 MG tablet Take 1 tablet by mouth once daily 05/03/23   Cleave Curling, MD  baclofen  (LIORESAL ) 10 MG tablet TAKE 1 TABLET BY MOUTH THREE TIMES DAILY 04/30/23   Cameron Cea, MD  blood glucose meter kit and supplies KIT Dispense based on  patient and insurance preference. Use up to four times daily as directed. (FOR ICD-137). For QAC - HS accuchecks.  Dispense any approved glucometer with 1 month testing supplies. 03/24/21   Singh, Prashant K, MD  Blood Glucose Monitoring Suppl (ACCU-CHEK GUIDE) w/Device KIT Use to check blood sugar 2 times daily 12/22/21   Shamleffer, Ibtehal Jaralla, MD  buPROPion  (WELLBUTRIN  XL) 150 MG 24 hr tablet TAKE 1 TABLET BY MOUTH ONCE DAILY IN THE MORNING 05/03/23   Cleave Curling, MD  carvedilol  (COREG ) 6.25 MG tablet TAKE 1 TABLET BY MOUTH TWICE DAILY WITH A MEAL 05/03/23   Cleave Curling, MD  dapagliflozin  propanediol (FARXIGA ) 10 MG TABS tablet Take 1 tablet (10 mg total) by mouth daily. 02/02/23   Shamleffer, Ibtehal Jaralla, MD  glucose blood test strip Use as instructed 12/19/21   Shamleffer, Ibtehal Jaralla, MD  Insulin  NPH, Human,, Isophane, (NOVOLIN N FLEXPEN) 100 UNIT/ML Kiwkpen Inject 10 Units into the skin in the morning and at bedtime. 08/10/23   Shamleffer, Ibtehal Jaralla, MD  Insulin  Pen Needle 32G X 4 MM MISC 1 Device by Does not apply route in the morning and at bedtime. 08/10/23   Shamleffer, Ibtehal Jaralla, MD  letrozole  (FEMARA ) 2.5 MG tablet Take 1 tablet (2.5 mg total) by mouth daily. 02/11/23   Cameron Cea, MD  Semaglutide , 1 MG/DOSE, (OZEMPIC , 1 MG/DOSE,) 4 MG/3ML SOPN Inject 1 mg into the skin once a week. 02/02/23   Shamleffer, Ibtehal Jaralla, MD  prochlorperazine  (COMPAZINE ) 10 MG tablet Take 1 tablet (10 mg total) by mouth every 6 (six) hours as needed (Nausea or vomiting). 07/05/19 11/20/19  Cameron Cea, MD                                                                                                                                    Past Surgical History Past Surgical History:  Procedure Laterality Date   ANTERIOR CERVICAL DECOMP/DISCECTOMY FUSION N/A 10/27/2012   Procedure: ANTERIOR CERVICAL DECOMPRESSION/DISCECTOMY FUSION 1 LEVEL;  Surgeon: Augustine Blocker, MD;  Location: [facility];  Service: Neurosurgery;  Laterality: N/A;  ANTERIOR CERVICAL DECOMPRESSION/DISCECTOMY FUSION 1 LEVEL   BREAST LUMPECTOMY WITH RADIOACTIVE SEED AND SENTINEL LYMPH NODE BIOPSY Right 12/13/2019   Procedure: RIGHT BREAST LUMPECTOMY WITH RADIOACTIVE SEED AND RIGHT AXILLARY  SENTINEL LYMPH NODE BIOPSY, RIGHT AXILLARY NODE SEED GUIDED EXCISION, BLUE DYE INJECTION;  Surgeon: Enid Harry, MD;  Location:  [facility];  Service: General;  Laterality: Right;  PEC BLOCK   BREAST REDUCTION SURGERY Bilateral 03/04/2021   Procedure: MAMMARY REDUCTION  (BREAST);  Surgeon: Contogiannis, Kevon Pellegrini, MD;  Location: [facility];  Service: Plastics;  Laterality: Bilateral;   CHALAZION EXCISION     L eye    PORTACATH PLACEMENT Left 07/13/2019   Procedure: INSERTION PORT-A-CATH WITH ULTRASOUND;  Surgeon: Enid Harry, MD;  Location: [facility];  Service: General;  Laterality: Left;   VAGINAL DELIVERY  2002   WISDOM TOOTH EXTRACTION     Family History Family History  Problem Relation Age of Onset   Osteoarthritis Mother    Hypertension Mother    Diabetes Mother    Hypertension Father    Gout Father    Stroke Father 71       embolic   Diabetes Half-Brother    Hypertension Half-Brother    Heart failure Half-Brother 65   Kidney disease Half-Brother    Diabetes Paternal Grandfather    Cancer Paternal Grandfather 41       breast cancer    Social History Social History   Tobacco Use   Smoking status: Every Day    Current packs/day: 0.00    Average packs/day: 1 pack/day for 10.0 years (10.0 ttl pk-yrs)    Types: Cigarettes    Start date: 06/25/2010    Last attempt to quit: 06/24/2020    Years since quitting: 3.1    Passive exposure: Never   Smokeless tobacco: Never  Vaping Use   Vaping status: Never Used  Substance Use Topics   Alcohol use: Yes    Comment: occas   Drug use: Yes    Types: Marijuana    Comment: occasionally   Allergies Aspirin  Review of  Systems Review of Systems  Endocrine: Positive for polydipsia and polyuria.    Physical Exam Vital Signs  I have reviewed the triage vital signs BP (!) 169/113 (BP Location: Right Arm)   Pulse 97   Temp 98.5 F (36.9 C)   Resp 20   LMP  (LMP Unknown) Comment: June 2022  SpO2 96%   Physical Exam Vitals and nursing note reviewed.  Constitutional:      General: She is not in acute distress.    Appearance: She is well-developed.  HENT:     Head: Normocephalic and atraumatic.  Eyes:     Conjunctiva/sclera: Conjunctivae normal.  Cardiovascular:     Rate and Rhythm: Normal rate and regular rhythm.     Heart sounds: No murmur heard. Pulmonary:     Effort: Pulmonary effort is normal. No respiratory distress.     Breath sounds: Normal breath sounds.  Abdominal:     Palpations: Abdomen is soft.     Tenderness: There is no abdominal tenderness.  Musculoskeletal:        General: No swelling.     Cervical back: Neck supple.  Skin:    General: Skin is warm and dry.     Capillary Refill: Capillary refill takes less than 2 seconds.  Neurological:     Mental Status: She is alert.  Psychiatric:        Mood and Affect: Mood normal.     ED Results and Treatments Labs (all labs ordered are listed, but only abnormal results are displayed) Labs Reviewed  URINALYSIS, ROUTINE W REFLEX MICROSCOPIC - Abnormal; Notable for the following components:      Result Value   Color, Urine STRAW (*)  Specific Gravity, Urine 1.036 (*)    Glucose, UA >=500 (*)    Bacteria, UA RARE (*)    All other components within normal limits  COMPREHENSIVE METABOLIC PANEL WITH GFR - Abnormal; Notable for the following components:   Sodium 133 (*)    Glucose, Bld 550 (*)    All other components within normal limits  CBG MONITORING, ED - Abnormal; Notable for the following components:   Glucose-Capillary 556 (*)    All other components within normal limits  I-STAT CHEM 8, ED - Abnormal; Notable for the  following components:   Glucose, Bld 553 (*)    Calcium , Ion 1.14 (*)    All other components within normal limits  I-STAT VENOUS BLOOD GAS, ED - Abnormal; Notable for the following components:   pCO2, Ven 43.3 (*)    Sodium 134 (*)    All other components within normal limits  CBC                                                                                                                          Radiology No results found.  Pertinent labs & imaging results that were available during my care of the patient were reviewed by me and considered in my medical decision making (see MDM for details).  Medications Ordered in ED Medications  lactated ringers  bolus 1,000 mL (has no administration in time range)  insulin  aspart (novoLOG ) injection 10 Units (has no administration in time range)                                                                                                                                     Procedures .Critical Care  Performed by: Karlyn Overman, MD Authorized by: Karlyn Overman, MD   Critical care provider statement:    Critical care time (minutes):  30   Critical care was necessary to treat or prevent imminent or life-threatening deterioration of the following conditions:  Endocrine crisis   Critical care was time spent personally by me on the following activities:  Development of treatment plan with patient or surrogate, discussions with consultants, evaluation of patient's response to treatment, examination of patient, ordering and review of laboratory studies, ordering and review of radiographic studies, ordering and performing treatments and interventions, pulse oximetry, re-evaluation of patient's condition and review of old charts   (including critical care time)  Medical Decision Making /  ED Course   This patient presents to the ED for concern of hyperglycemia, this involves an extensive number of treatment options, and is a complaint that  carries with it a high risk of complications and morbidity.  The differential diagnosis includes stress hyperglycemia, medication noncompliance, DKA, HHS  MDM: Patient seen in the emergency room for evaluation of hyperglycemia.  Physical exam is unremarkable.  Laboratory evaluation with significant hyperglycemia to 550 but anion gap is normal at 11, normal CO2, pH is normal.  Patient fluid resuscitated and given 10 units of insulin .  On reevaluation glucose is improving in the 400s.  Received an additional 10 units of insulin  and on recheck glucose 304.  I spoke with the [facility] team who came and evaluated the patient and patient will pick up her medications tomorrow at the [facility].  At this time she does not meet inpatient criteria for admission and will be discharged with outpatient follow-up.  Return precautions given which she voiced understanding she was discharged.   Additional history obtained:  -External records from outside source obtained and reviewed including: Chart review including previous notes, labs, imaging, consultation notes   Lab Tests: -I ordered, reviewed, and interpreted labs.   The pertinent results include:   Labs Reviewed  URINALYSIS, ROUTINE W REFLEX MICROSCOPIC - Abnormal; Notable for the following components:      Result Value   Color, Urine STRAW (*)    Specific Gravity, Urine 1.036 (*)    Glucose, UA >=500 (*)    Bacteria, UA RARE (*)    All other components within normal limits  COMPREHENSIVE METABOLIC PANEL WITH GFR - Abnormal; Notable for the following components:   Sodium 133 (*)    Glucose, Bld 550 (*)    All other components within normal limits  CBG MONITORING, ED - Abnormal; Notable for the following components:   Glucose-Capillary 556 (*)    All other components within normal limits  I-STAT CHEM 8, ED - Abnormal; Notable for the following components:   Glucose, Bld 553 (*)    Calcium , Ion 1.14 (*)    All other components within  normal limits  I-STAT VENOUS BLOOD GAS, ED - Abnormal; Notable for the following components:   pCO2, Ven 43.3 (*)    Sodium 134 (*)    All other components within normal limits  CBC       Medicines ordered and prescription drug management: Meds ordered this encounter  Medications   lactated ringers  bolus 1,000 mL   insulin  aspart (novoLOG ) injection 10 Units    -I have reviewed the patients home medicines and have made adjustments as needed  Critical interventions Fluid resuscitation, insulin   Consultations Obtained: I requested consultation with the [facility] team,  and discussed lab and imaging findings as well as pertinent plan - they recommend: Pick up medications at [facility] tomorrow   Cardiac Monitoring: The patient was maintained on a cardiac monitor.  I personally viewed and interpreted the cardiac monitored which showed an underlying rhythm of: NSR  Social Determinants of Health:  Factors impacting patients care include: Has been out of her medication for 3 months   Reevaluation: After the interventions noted above, I reevaluated the patient and found that they have :improved  Co morbidities that complicate the patient evaluation  Past Medical History:  Diagnosis Date   Abnormality of gait 08/02/2012   resolved per patient on 07/12/19   Anemia    iron taking in the past,  not currently   Anxiety    Arthritis    spine- cerv.  & arm    Asthma    Bulging lumbar disc    Cancer (HCC)    Right Breast-starts chemo on 07/14/19   Chronic arthralgias of knees and hips 08/02/2012   Depression    Pt. took self off Celexa one month ago, states she did n't like how it made her feel .   Family history of cancer    GERD (gastroesophageal reflux disease)    diet controlled   Heart murmur    pt. been told that this is a fact, no echo or cardiac surveillance in the past    HLD (hyperlipidemia)    no meds, diet controlled   Hypertension    MRSA  (methicillin resistant Staphylococcus aureus) 03/02/2005   Obesity    Spinal stenosis       Dispostion: I considered admission for this patient, but at this time she does not meet inpatient criteria for admission and will be discharged with outpatient follow-up.     Final Clinical Impression(s) / ED Diagnoses Final diagnoses:  None     @PCDICTATION @    Karlyn Overman, MD 08/10/23 2101

## 2023-08-10 NOTE — Telephone Encounter (Signed)
 Called patient and I left a voicemail letting her know that her appointment time on 5/17 was 1:15pm

## 2023-08-10 NOTE — ED Triage Notes (Signed)
 Pt here with reports of hyperglycemia. States at home it was reading HI. Pt has been off of her medications since February due to starting a new job. Reports increased thirst and frequent urination. Complains of headache.

## 2023-08-10 NOTE — ED Notes (Signed)
 Vbg results given to crystal b rn by at

## 2023-08-10 NOTE — Care Management (Signed)
 ED RN Case Manager met with patient in rm. 33.  Patient reports running out of  her medication for two months due to a change in insurance.  Patient states she has a new job but insurance will not kick in until next month. Discussed MATCH medication assistance, patient is agreeable and very appreciative. She confirms having  a PCP.  Updated the EDP who has sent her prewsscriptions to the [facility] pharmacy. Explained to patient that the [facility] is closed tonight so she would need to make arrangement to come back to the hospital in the am, patient is okl to return. No furthe ED RN Case Management needs.

## 2023-08-10 NOTE — Care Management (Signed)
 MATCH Medication Assistance Card *Pharmacies please call [phone] for claim processing assistance Name: Shaylinn Zurlo                                                                                                                                                                                                                               Relationship Code:  1 ID (MRN): [id number]                                                                                                                                                                                                                           Person Code:  [id number] Bin: [id number] RX Group: [id number] Discharge Date: 08/10/2023                                 RX PCN:  [id number] Expiration Date:08/20/2023                                           (must be filled within 7 days of discharge)     You have been approved to have the prescriptions written by your discharging physician filled through our [facility] (Medication Assistance Through [facility]) program. This program allows for a one-time (no refills) 34-day supply of  selected medications for a low copay amount.  The copay is $3.00 per prescription. For instance, if you have one prescription, you will pay $3.00; for two prescriptions, you pay $6.00; for three prescriptions, you pay $9.00; and so on.  Only certain pharmacies are participating in this program with [facility]. You will need to select one of the pharmacies from the attached list and take your prescriptions, this letter, and your photo ID to one of the [facility] pharmacies.   We are excited that you are able to use the [facility] program to get your medications. These prescriptions must be filled within 7 days of hospital discharge or they will no longer be valid for the [facility] program. Should you have any problems with your prescriptions please contact your case management team member at [phone] for [place]  [place].  Thank you,   [facility] Care Management

## 2023-08-11 ENCOUNTER — Encounter: Payer: Self-pay | Admitting: Hematology and Oncology

## 2023-08-11 ENCOUNTER — Other Ambulatory Visit (HOSPITAL_COMMUNITY): Payer: Self-pay

## 2023-08-11 MED ORDER — TRUEDRAW LANCING DEVICE MISC
0 refills | Status: AC
  Filled 2023-08-11: qty 1, 30d supply, fill #0

## 2023-08-11 MED ORDER — TRUEPLUS LANCETS 28G MISC
1.0000 | Freq: Two times a day (BID) | 0 refills | Status: AC
  Filled 2023-08-11: qty 100, 30d supply, fill #0

## 2023-08-14 ENCOUNTER — Inpatient Hospital Stay: Payer: Self-pay | Attending: Hematology and Oncology

## 2023-08-14 DIAGNOSIS — Z5111 Encounter for antineoplastic chemotherapy: Secondary | ICD-10-CM | POA: Insufficient documentation

## 2023-08-14 DIAGNOSIS — Z170 Estrogen receptor positive status [ER+]: Secondary | ICD-10-CM | POA: Insufficient documentation

## 2023-08-14 DIAGNOSIS — C50411 Malignant neoplasm of upper-outer quadrant of right female breast: Secondary | ICD-10-CM | POA: Insufficient documentation

## 2023-08-14 DIAGNOSIS — Z1731 Human epidermal growth factor receptor 2 positive status: Secondary | ICD-10-CM | POA: Insufficient documentation

## 2023-08-14 DIAGNOSIS — Z1722 Progesterone receptor negative status: Secondary | ICD-10-CM | POA: Insufficient documentation

## 2023-08-16 ENCOUNTER — Encounter: Payer: Self-pay | Admitting: Hematology and Oncology

## 2023-08-20 ENCOUNTER — Inpatient Hospital Stay

## 2023-08-20 ENCOUNTER — Telehealth: Payer: Self-pay

## 2023-08-20 VITALS — BP 154/77 | HR 88 | Resp 18

## 2023-08-20 DIAGNOSIS — Z5111 Encounter for antineoplastic chemotherapy: Secondary | ICD-10-CM | POA: Diagnosis not present

## 2023-08-20 DIAGNOSIS — Z1722 Progesterone receptor negative status: Secondary | ICD-10-CM | POA: Diagnosis not present

## 2023-08-20 DIAGNOSIS — Z95828 Presence of other vascular implants and grafts: Secondary | ICD-10-CM

## 2023-08-20 DIAGNOSIS — Z170 Estrogen receptor positive status [ER+]: Secondary | ICD-10-CM

## 2023-08-20 DIAGNOSIS — Z1731 Human epidermal growth factor receptor 2 positive status: Secondary | ICD-10-CM | POA: Diagnosis not present

## 2023-08-20 MED ORDER — GOSERELIN ACETATE 3.6 MG ~~LOC~~ IMPL
3.6000 mg | DRUG_IMPLANT | Freq: Once | SUBCUTANEOUS | Status: AC
  Administered 2023-08-20: 3.6 mg via SUBCUTANEOUS
  Filled 2023-08-20: qty 3.6

## 2023-08-20 NOTE — Telephone Encounter (Signed)
 Patient called and left a message requesting to increase the Novolin  N that she was recently prescribed. Per patient her best has been 280 but patient did not say what day this was. RN left VM requesting patient call back to get additional info.

## 2023-08-24 ENCOUNTER — Encounter: Payer: Self-pay | Admitting: Internal Medicine

## 2023-08-24 ENCOUNTER — Ambulatory Visit: Payer: Self-pay

## 2023-08-24 ENCOUNTER — Ambulatory Visit: Admitting: Internal Medicine

## 2023-08-24 ENCOUNTER — Encounter: Payer: Self-pay | Admitting: Hematology and Oncology

## 2023-08-24 VITALS — BP 128/88 | HR 76 | Temp 98.8°F | Ht 64.0 in | Wt 221.4 lb

## 2023-08-24 DIAGNOSIS — E785 Hyperlipidemia, unspecified: Secondary | ICD-10-CM

## 2023-08-24 DIAGNOSIS — N760 Acute vaginitis: Secondary | ICD-10-CM

## 2023-08-24 DIAGNOSIS — E1165 Type 2 diabetes mellitus with hyperglycemia: Secondary | ICD-10-CM

## 2023-08-24 DIAGNOSIS — E1169 Type 2 diabetes mellitus with other specified complication: Secondary | ICD-10-CM

## 2023-08-24 DIAGNOSIS — I10 Essential (primary) hypertension: Secondary | ICD-10-CM | POA: Diagnosis not present

## 2023-08-24 DIAGNOSIS — E66812 Obesity, class 2: Secondary | ICD-10-CM

## 2023-08-24 DIAGNOSIS — Z6836 Body mass index (BMI) 36.0-36.9, adult: Secondary | ICD-10-CM

## 2023-08-24 MED ORDER — FLUCONAZOLE 150 MG PO TABS: ORAL_TABLET | ORAL | 0 refills | Status: DC

## 2023-08-24 NOTE — Telephone Encounter (Signed)
 Chief Complaint: vaginal itching  Symptoms: vaginal itching and redness Frequency: since friday Pertinent Negatives: Patient denies discharge  Disposition: [] ED /[] Urgent Care (no appt availability in office) / [] Appointment(In office/virtual)/ []  [place] [facility]/ [x] Home Care/ [] Refused Recommended Disposition /[] [place] [facility]/ []  Follow-up with PCP Additional Notes: Pt states that she is having some vaginal itching and irritation. States started on Friday. States her glucose has been out of control and she has been experiencing polyuria and thinks that could be the cause of the vaginal irritation. Pt denies trying anything over the counter for the itching. States she has only used Vaseline.   Copied From CRM [id number]. Reason for Triage: Patient states she has a yeast infection and would like something called in to preferred pharmacy.   Callback #: [id number]   Preferred pharmacy:   [facility] [address], [place] - [address]  [address] [place] [address]  Phone: [phone] Fax: [phone]  Hours: Not open 24 hours  Reason for Disposition  [1] Symptoms of a yeast infection (i.e., itchy, white discharge, not bad smelling) AND [2] feels like prior vaginal yeast infections  Protocols used: [place] Symptoms-A-AH

## 2023-08-24 NOTE — Patient Instructions (Signed)

## 2023-08-24 NOTE — Progress Notes (Signed)
 I,[name], CMA,acting as a Neurosurgeon for Smiley Dung, MD.,have documented all relevant documentation on the behalf of Smiley Dung, MD,as directed by  Smiley Dung, MD while in the presence of Smiley Dung, MD.  Subjective:  Patient ID: Heidi Costa , female    DOB: 06/24/1980 , 43 y.o.   MRN: [id number]  Chief Complaint  Patient presents with   Diabetes    Patient presents for a DM & BP Check. Patient states she is not able to control her sugars with the medication: Insulin  NPH, Human,, Isophane, (NOVOLIN  N FLEXPEN) 100 UNIT/ML Kiwkpen. Patient denies headaches, chest pain and sob.   Hypertension    HPI Discussed the use of AI scribe software for clinical note transcription with the patient, who gave verbal consent to proceed.  History of Present Illness Heidi Costa is a 43 year old female with diabetes who presents for follow-up after an emergency room visit for hyperglycemia.  She went to ER on 5/13 for further evaluation of hyperglycemia due to non-compliance with meds.   She experienced a lapse in insurance coverage for three months after switching jobs, which led to difficulties in affording her medications, including Ozempic . Her diabetes was previously well-controlled, but during this period, her blood sugar levels exceeded 500 mg/dL, prompting an emergency room visit on May 13th.  She is currently on Novolog  insulin , prescribed by the hospital, with a regimen of 10 mg in the morning and 10 mg at night, to be taken before meals. Her morning blood sugar levels are around 282 mg/dL. She has resumed taking Farxiga , which she received as a sample from the hospital, and has had no issues with this medication in the past.  She has been experiencing increased urination, approximately every 30 minutes, and fatigue, which she attributes to her high blood sugar levels. She also reports vaginal itching and discomfort.  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 She previously worked for [employer] and [employer] in [place].   Diabetes She presents for her follow-up diabetic visit. She has type 2 diabetes mellitus. Her disease course has been stable. Pertinent negatives for hypoglycemia include no dizziness or headaches. Associated symptoms include fatigue. Pertinent negatives for diabetes include no chest pain, no polydipsia, no polyphagia, no polyuria and no weakness. There are no hypoglycemic complications. Risk factors for coronary artery disease include diabetes mellitus and obesity.  Hypertension This is a chronic problem. The problem has been gradually improving since onset. The problem is controlled. Pertinent negatives include no chest pain or headaches.     Past Medical History:  Diagnosis Date   Abnormality of gait 08/02/2012   resolved per patient on 07/12/19   Anemia    iron taking in the past, not currently   Anxiety    Arthritis    spine- cerv.  & arm    Asthma    Bulging lumbar disc    Cancer (HCC)    Right Breast-starts chemo on 07/14/19   Chronic arthralgias of knees and hips 08/02/2012   Depression    Pt. took self off Celexa one month ago, states she did n't like how it made her feel .   Family history of cancer    GERD (gastroesophageal reflux disease)    diet controlled   Heart murmur    pt. been told that this is a fact, no  echo or cardiac surveillance in the past    HLD (hyperlipidemia)    no meds, diet controlled   Hypertension    MRSA (methicillin resistant Staphylococcus aureus) 03/02/2005   Obesity    Spinal stenosis      Family History  Problem Relation Age of Onset   Osteoarthritis Mother    Hypertension Mother    Diabetes Mother    Hypertension Father    Gout Father    Stroke Father 57       embolic   Diabetes Half-Brother    Hypertension Half-Brother    Heart failure Half-Brother 55   Kidney disease  Half-Brother    Diabetes Paternal Grandfather    Cancer Paternal Grandfather 41       breast cancer     Current Outpatient Medications:    acetaminophen  (TYLENOL ) 500 MG tablet, Take 500-1,000 mg by mouth daily as needed for pain., Disp: , Rfl:    albuterol  (VENTOLIN  HFA) 108 (90 Base) MCG/ACT inhaler, Inhale 2 puffs into the lungs every 6 (six) hours as needed for wheezing or shortness of breath., Disp: 18 g, Rfl: 2   atorvastatin  (LIPITOR) 10 MG tablet, Take 1 tablet by mouth once daily, Disp: 30 tablet, Rfl: 0   baclofen  (LIORESAL ) 10 MG tablet, TAKE 1 TABLET BY MOUTH THREE TIMES DAILY, Disp: 90 tablet, Rfl: 0   blood glucose meter kit and supplies KIT, Dispense based on patient and insurance preference. Use up to four times daily as directed. (FOR ICD-137). For QAC - HS accuchecks.  Dispense any approved glucometer with 1 month testing supplies., Disp: 1 each, Rfl: 1   Blood Glucose Monitoring Suppl (ACCU-CHEK GUIDE) w/Device KIT, Use to check blood sugar 2 times daily, Disp: 1 kit, Rfl: 0   buPROPion  (WELLBUTRIN  XL) 150 MG 24 hr tablet, TAKE 1 TABLET BY MOUTH ONCE DAILY IN THE MORNING, Disp: 90 tablet, Rfl: 0   carvedilol  (COREG ) 6.25 MG tablet, TAKE 1 TABLET BY MOUTH TWICE DAILY WITH A MEAL, Disp: 180 tablet, Rfl: 0   fluconazole  (DIFLUCAN ) 150 MG tablet, One tab po today, repeat in 48 hours, Disp: 2 tablet, Rfl: 0   glucose blood test strip, Use as instructed, Disp: 100 each, Rfl: 12   Insulin  Pen Needle 32G X 4 MM MISC, Use morning and at bedtime., Disp: 100 each, Rfl: 3   Lancet Devices (TRUEDRAW LANCING DEVICE) MISC, Use as directed, Disp: 1 each, Rfl: 0   letrozole  (FEMARA ) 2.5 MG tablet, Take 1 tablet (2.5 mg total) by mouth daily., Disp: 90 tablet, Rfl: 3   TRUEplus Lancets 28G MISC, 1 each in the morning and at bedtime., Disp: 100 each, Rfl: 0   dapagliflozin  propanediol (FARXIGA ) 10 MG TABS tablet, Take 1 tablet (10 mg total) by mouth daily., Disp: 90 tablet, Rfl: 3   insulin   glargine (LANTUS  SOLOSTAR) 100 UNIT/ML Solostar Pen, Inject 26 Units into the skin daily., Disp: 30 mL, Rfl: 3   Semaglutide ,0.25 or 0.5MG /DOS, (OZEMPIC , 0.25 OR 0.5 MG/DOSE,) 2 MG/3ML SOPN, Inject 0.5 mg into the skin once a week., Disp: 9 mL, Rfl: 3   valACYclovir  (VALTREX ) 1000 MG tablet, Take 1,000 mg by mouth 2 (two) times daily., Disp: , Rfl:   Current Facility-Administered Medications:    lidocaine  (PF) (XYLOCAINE ) 1 % injection 10 mL, 10 mL, Intradermal, Once,    Allergies  Allergen Reactions   Aspirin Rash     Review of Systems  Constitutional:  Positive for fatigue.  Respiratory: Negative.  Cardiovascular: Negative.  Negative for chest pain.  Gastrointestinal: Negative.   Endocrine: Negative for polydipsia, polyphagia and polyuria.  Genitourinary:  Positive for frequency.  Neurological: Negative.  Negative for dizziness, weakness and headaches.  Psychiatric/Behavioral: Negative.       Today's Vitals   08/24/23 1520  BP: 128/88  Pulse: 76  Temp: 98.8 F (37.1 C)  SpO2: 98%  Weight: 221 lb 6.4 oz (100.4 kg)  Height: 5\' 4"  (1.626 m)   Body mass index is 38 kg/m.  Wt Readings from Last 3 Encounters:  08/27/23 214 lb 9.6 oz (97.3 kg)  08/24/23 221 lb 6.4 oz (100.4 kg)  02/11/23 203 lb 4.8 oz (92.2 kg)     Objective:  Physical Exam Vitals and nursing note reviewed.  Constitutional:      Appearance: Normal appearance. She is obese.  HENT:     Head: Normocephalic and atraumatic.  Eyes:     Extraocular Movements: Extraocular movements intact.  Cardiovascular:     Rate and Rhythm: Normal rate and regular rhythm.     Heart sounds: Normal heart sounds.  Pulmonary:     Effort: Pulmonary effort is normal.     Breath sounds: Normal breath sounds.  Musculoskeletal:     Cervical back: Normal range of motion.  Skin:    General: Skin is warm.  Neurological:     General: No focal deficit present.     Mental Status: She is alert.  Psychiatric:        Mood and  Affect: Mood normal.        Behavior: Behavior normal.      Assessment And Plan:  Dyslipidemia associated with type 2 diabetes mellitus (HCC) Assessment & Plan: Chronic, ER visit notes reviewed.  She has appt scheduled with Endo next week. Recent hyperglycemia due to medication non-adherence, currently managed with insulin . Blood glucose levels remain elevated. Plan to transition to Ozempic  and Farxiga  for better glycemic control. Pending labs for further insulin  management. Discussed insulin  timing and hydration importance. - Start Ozempic  at 0.25 mg weekly, increase to 0.5 mg after two weeks if tolerated. - Continue Farxiga  as previously prescribed. - Check kidney function. - Consider long-acting insulin  based on lab results. - Educate on insulin  administration before meals. - Ensure adequate hydration while on Farxiga . - Confirms neg family history for thyroid  cancer   Orders: -     Lipid panel -     Hemoglobin A1c -     Microalbumin / creatinine urine ratio -     BMP8+eGFR -     TSH  Essential hypertension, benign Assessment & Plan: Chronic, fair control. Goal BP<120/80.  She will continue with carvedilol  6.25mg  twice daily. - Follow low sodium diet - Consider adding ARB if BP remains elevated  Orders: -     Lipid panel -     BMP8+eGFR  Vulvovaginitis Assessment & Plan: Likely secondary to poorly controlled diabetes and hyperglycemia. - Prescribe Diflucan , take one dose today and another in two days.   Class 2 severe obesity due to excess calories with serious comorbidity and body mass index (BMI) of 36.0 to 36.9 in adult [facility]) Assessment & Plan: She is encouraged to strive for BMI less than 30 to decrease cardiac risk. Advised to aim for at least 150 minutes of exercise per week.    Other orders -     Fluconazole ; One tab po today, repeat in 48 hours  Dispense: 2 tablet; Refill: 0    Return if symptoms  worsen or fail to improve.  Patient was given opportunity  to ask questions. Patient verbalized understanding of the plan and was able to repeat key elements of the plan. All questions were answered to their satisfaction.   I, Smiley Dung, MD, have reviewed all documentation for this visit. The documentation on 08/24/23 for the exam, diagnosis, procedures, and orders are all accurate and complete.   IF YOU HAVE BEEN REFERRED TO A SPECIALIST, IT MAY TAKE 1-2 WEEKS TO SCHEDULE/PROCESS THE REFERRAL. IF YOU HAVE NOT HEARD FROM US /SPECIALIST IN TWO WEEKS, PLEASE GIVE US  A CALL AT [phone].   THE PATIENT IS ENCOURAGED TO PRACTICE SOCIAL DISTANCING DUE TO THE COVID-19 PANDEMIC.

## 2023-08-25 LAB — MICROALBUMIN / CREATININE URINE RATIO
Creatinine, Urine: 49.2 mg/dL
Microalb/Creat Ratio: <6 mg/g{creat} (ref 0–29)
Microalbumin, Urine: <3 ug/mL

## 2023-08-25 LAB — LIPID PANEL
Chol/HDL Ratio: 5 ratio — ABNORMAL HIGH (ref 0.0–4.4)
Cholesterol, Total: 160 mg/dL (ref 100–199)
HDL: 32 mg/dL — ABNORMAL LOW (ref >39)
LDL Chol Calc (NIH): 85 mg/dL (ref 0–99)
Triglycerides: 258 mg/dL — ABNORMAL HIGH (ref 0–149)
VLDL Cholesterol Cal: 43 mg/dL — ABNORMAL HIGH (ref 5–40)

## 2023-08-25 LAB — TSH: TSH: 1.64 u[IU]/mL (ref 0.450–4.500)

## 2023-08-25 LAB — BMP8+EGFR
BUN/Creatinine Ratio: 10 (ref 9–23)
BUN: 10 mg/dL (ref 6–24)
CO2: 20 mmol/L (ref 20–29)
Calcium: 9.4 mg/dL (ref 8.7–10.2)
Chloride: 100 mmol/L (ref 96–106)
Creatinine, Ser: 1.05 mg/dL — ABNORMAL HIGH (ref 0.57–1.00)
Glucose: 330 mg/dL — ABNORMAL HIGH (ref 70–99)
Potassium: 4.5 mmol/L (ref 3.5–5.2)
Sodium: 139 mmol/L (ref 134–144)
eGFR: 68 mL/min/{1.73_m2} (ref >59)

## 2023-08-25 LAB — HEMOGLOBIN A1C
Est. average glucose Bld gHb Est-mCnc: 240 mg/dL
Hgb A1c MFr Bld: 10 % — ABNORMAL HIGH (ref 4.8–5.6)

## 2023-08-26 ENCOUNTER — Ambulatory Visit: Payer: Self-pay

## 2023-08-26 ENCOUNTER — Ambulatory Visit: Payer: Self-pay | Admitting: Internal Medicine

## 2023-08-26 NOTE — Telephone Encounter (Signed)
  Chief Complaint: vaginal itching Symptoms: itching and irritation Frequency: x 2 weeks Pertinent Negatives: Patient denies discharge Disposition: [] ED /[] Urgent Care (no appt availability in office) / [x] Appointment(In office/virtual)/ []  [place] [facility]/ [] Home Care/ [] Refused Recommended Disposition /[] [place] [facility]/ []  Follow-up with PCP Additional Notes:  Pt states that she is still having the vaginal irritation and itching. Pt states that she took one on May 27th and the second dose this morning and symptoms not any better.  Denies any other symptoms just the itching and irritation. States that she also tried a medicated cream.    Copied from CRM [id number]. Topic: Clinical - Medical Advice >> Aug 26, 2023  1:38 PM Adaline Holly wrote: Patient was seen in office on 5/27 for a yeast infection and given Diflucan . Patient states she has already taken both pills as instructed, however the symptoms are not improving. Patient would like to know if there is something else she can do. Please advise. Reason for Disposition  [1] Vaginal itching AND [2] not improved > 3 days following Care Advice  Protocols used: Vaginal Symptoms-A-AH

## 2023-08-27 ENCOUNTER — Encounter: Payer: Self-pay | Admitting: Internal Medicine

## 2023-08-27 ENCOUNTER — Ambulatory Visit (INDEPENDENT_AMBULATORY_CARE_PROVIDER_SITE_OTHER): Admitting: Internal Medicine

## 2023-08-27 ENCOUNTER — Telehealth: Payer: Self-pay

## 2023-08-27 VITALS — BP 126/82 | HR 80 | Ht 64.0 in | Wt 214.6 lb

## 2023-08-27 DIAGNOSIS — E785 Hyperlipidemia, unspecified: Secondary | ICD-10-CM | POA: Diagnosis not present

## 2023-08-27 DIAGNOSIS — Z7984 Long term (current) use of oral hypoglycemic drugs: Secondary | ICD-10-CM

## 2023-08-27 DIAGNOSIS — E1165 Type 2 diabetes mellitus with hyperglycemia: Secondary | ICD-10-CM | POA: Insufficient documentation

## 2023-08-27 DIAGNOSIS — Z7985 Long-term (current) use of injectable non-insulin antidiabetic drugs: Secondary | ICD-10-CM | POA: Diagnosis not present

## 2023-08-27 DIAGNOSIS — Z794 Long term (current) use of insulin: Secondary | ICD-10-CM

## 2023-08-27 MED ORDER — DAPAGLIFLOZIN PROPANEDIOL 10 MG PO TABS: 10.0000 mg | ORAL_TABLET | Freq: Every day | ORAL | 3 refills | Status: DC

## 2023-08-27 MED ORDER — OZEMPIC (0.25 OR 0.5 MG/DOSE) 2 MG/3ML ~~LOC~~ SOPN: 0.5000 mg | PEN_INJECTOR | SUBCUTANEOUS | 3 refills | Status: DC

## 2023-08-27 MED ORDER — LANTUS SOLOSTAR 100 UNIT/ML ~~LOC~~ SOPN: 26.0000 [IU] | PEN_INJECTOR | Freq: Every day | SUBCUTANEOUS | 3 refills | Status: DC

## 2023-08-27 NOTE — Patient Instructions (Addendum)
 STOP Novolin -N  Start Lantus  26 units once daily  Continue Farxiga  10 mg daily  Take Ozempic  0.25 mg weekly for 4 weeks than increase to 0.5 mg weekly     HOW TO TREAT LOW BLOOD SUGARS (Blood sugar LESS THAN 70 MG/DL) Please follow the RULE OF 15 for the treatment of hypoglycemia treatment (when your (blood sugars are less than 70 mg/dL)   STEP 1: Take 15 grams of carbohydrates when your blood sugar is low, which includes:  3-4 GLUCOSE TABS  OR 3-4 OZ OF JUICE OR REGULAR SODA OR ONE TUBE OF GLUCOSE GEL    STEP 2: RECHECK blood sugar in 15 MINUTES STEP 3: If your blood sugar is still low at the 15 minute recheck --> then, go back to STEP 1 and treat AGAIN with another 15 grams of carbohydrates.

## 2023-08-27 NOTE — Telephone Encounter (Signed)
 Pharmacy Patient Advocate Encounter   Received notification from CoverMyMeds that prior authorization for Ozempic  is required/requested.   Insurance verification completed.   The patient is insured through [facility]   Per test claim: PA required; PA submitted to above mentioned insurance via CoverMyMeds Key/confirmation #/EOC [id number] Status is pending

## 2023-08-27 NOTE — Progress Notes (Signed)
 Name: Heidi Costa  MRN/ DOB: [id number], [birth year]   Age/ Sex: 43 y.o., female    PCP: Cleave Curling, MD   Reason for Endocrinology Evaluation: Type 2 Diabetes Mellitus     Date of Initial Endocrinology Visit: 09/23/2021    PATIENT IDENTIFIER: Heidi Costa is a 43 y.o. female with a past medical history of Hx of breast ca ( S/P lumpectomy 2021) and chemo. The patient presented for initial endocrinology clinic visit on 09/23/2021 for consultative assistance with her diabetes management.    HPI: Heidi Costa was    Diagnosed with DM 02/2021 when she presented to the ED with symptomatic hyperglycemia.  She was noted to be in DKA, with an elevated anion gap at 25 and elevated  beta hydroxybutyrate> mmol/L. She was initially discharged on insulin  and Metformin  .  Prior Medications tried/Intolerance: Metformin , Janumet  Hemoglobin A1c has ranged from 6.1% in 2023, peaking at 10.5% in 2022.  On her initial visit to our clinic she had an A1c of 6.1%, we continued Farxiga  and Ozempic      SUBJECTIVE:   During the last visit (02/02/2023): A1c 5.8%     Today (08/27/23): Heidi Costa is here for follow-up on diabetes management.  She checks her blood sugars 2-4 x a day . The patient has not had hypoglycemic episodes since the last clinic visit.  Patient presented to the ED for symptomatic hyperglycemia 08/10/2023.  She has been without Farxiga  or Ozempic  for approximately 3 months prior to her presentation due to lack of health insurance..  Serum glucose 550 mg/DL  I started her on Novolin -N 07/2023 until her insurance kicked in  She was recently seen by her PCP and was restarted on Ozempic   She continues to follow-up with oncology, on chemotherapy Weight remains stable   Denies nausea, vomiting Continues with constipation  NO recent UTI   HOME DIABETES REGIMEN: Farxiga  10 mg Ozempic  0.5 mg weekly NPH 10 units BID  Statin: Yes ACE-I/ARB: no Prior Diabetic  Education:   METER DOWNLOAD SUMMARY: unable to download 30 day average 429 Range 243 - 589  mg/dL    DIABETIC COMPLICATIONS: Microvascular complications:   Denies: CKD Last eye exam: Completed 03/2022  Macrovascular complications:   Denies: CAD, PVD, CVA   PAST HISTORY: Past Medical History:  Past Medical History:  Diagnosis Date   Abnormality of gait 08/02/2012   resolved per patient on 07/12/19   Anemia    iron taking in the past, not currently   Anxiety    Arthritis    spine- cerv.  & arm    Asthma    Bulging lumbar disc    Cancer (HCC)    Right Breast-starts chemo on 07/14/19   Chronic arthralgias of knees and hips 08/02/2012   Depression    Pt. took self off Celexa one month ago, states she did n't like how it made her feel .   Family history of cancer    GERD (gastroesophageal reflux disease)    diet controlled   Heart murmur    pt. been told that this is a fact, no echo or cardiac surveillance in the past    HLD (hyperlipidemia)    no meds, diet controlled   Hypertension    MRSA (methicillin resistant Staphylococcus aureus) 03/02/2005   Obesity    Spinal stenosis    Past Surgical History:  Past Surgical History:  Procedure Laterality Date   ANTERIOR CERVICAL DECOMP/DISCECTOMY FUSION N/A 10/27/2012   Procedure: ANTERIOR CERVICAL DECOMPRESSION/DISCECTOMY  FUSION 1 LEVEL;  Surgeon: Augustine Blocker, MD;  Location: [facility];  Service: Neurosurgery;  Laterality: N/A;  ANTERIOR CERVICAL DECOMPRESSION/DISCECTOMY FUSION 1 LEVEL   BREAST LUMPECTOMY WITH RADIOACTIVE SEED AND SENTINEL LYMPH NODE BIOPSY Right 12/13/2019   Procedure: RIGHT BREAST LUMPECTOMY WITH RADIOACTIVE SEED AND RIGHT AXILLARY  SENTINEL LYMPH NODE BIOPSY, RIGHT AXILLARY NODE SEED GUIDED EXCISION, BLUE DYE INJECTION;  Surgeon: Enid Harry, MD;  Location: [facility];  Service: General;  Laterality: Right;  PEC BLOCK   BREAST REDUCTION SURGERY Bilateral 03/04/2021   Procedure:  MAMMARY REDUCTION  (BREAST);  Surgeon: Contogiannis, Kevon Pellegrini, MD;  Location:  SURGERY CENTER;  Service: Plastics;  Laterality: Bilateral;   CHALAZION EXCISION     L eye    PORTACATH PLACEMENT Left 07/13/2019   Procedure: INSERTION PORT-A-CATH WITH ULTRASOUND;  Surgeon: Enid Harry, MD;  Location: [facility];  Service: General;  Laterality: Left;   VAGINAL DELIVERY  2002   WISDOM TOOTH EXTRACTION      Social History:  reports that she has been smoking cigarettes. She started smoking about 13 years ago. She has a 10 pack-year smoking history. She has never been exposed to tobacco smoke. She has never used smokeless tobacco. She reports current alcohol use. She reports current drug use. Drug: Marijuana. Family History:  Family History  Problem Relation Age of Onset   Osteoarthritis Mother    Hypertension Mother    Diabetes Mother    Hypertension Father    Gout Father    Stroke Father 40       embolic   Diabetes Half-Brother    Hypertension Half-Brother    Heart failure Half-Brother 58   Kidney disease Half-Brother    Diabetes Paternal Grandfather    Cancer Paternal Grandfather 41       breast cancer     HOME MEDICATIONS: Allergies as of 08/27/2023       Reactions   Aspirin Rash        Medication List        Accurate as of Aug 27, 2023  6:51 AM. If you have any questions, ask your nurse or doctor.          Accu-Chek Guide w/Device Kit Use to check blood sugar 2 times daily   acetaminophen  500 MG tablet Commonly known as: TYLENOL  Take 500-1,000 mg by mouth daily as needed for pain.   albuterol  108 (90 Base) MCG/ACT inhaler Commonly known as: VENTOLIN  HFA Inhale 2 puffs into the lungs every 6 (six) hours as needed for wheezing or shortness of breath.   atorvastatin  10 MG tablet Commonly known as: LIPITOR Take 1 tablet by mouth once daily   baclofen  10 MG tablet Commonly known as: LIORESAL  TAKE 1 TABLET BY MOUTH THREE TIMES DAILY   blood glucose  meter kit and supplies Kit Dispense based on patient and insurance preference. Use up to four times daily as directed. (FOR ICD-137). For QAC - HS accuchecks.  Dispense any approved glucometer with 1 month testing supplies.   buPROPion  150 MG 24 hr tablet Commonly known as: WELLBUTRIN  XL TAKE 1 TABLET BY MOUTH ONCE DAILY IN THE MORNING   carvedilol  6.25 MG tablet Commonly known as: COREG  TAKE 1 TABLET BY MOUTH TWICE DAILY WITH A MEAL   Farxiga  10 MG Tabs tablet Generic drug: dapagliflozin  propanediol Take 1 tablet (10 mg total) by mouth daily.   fluconazole  150 MG tablet Commonly known as: Diflucan  One tab po today, repeat in 48  hours   Insupen Pen Needles 32G X 4 MM Misc Generic drug: Insulin  Pen Needle Use morning and at bedtime.   letrozole  2.5 MG tablet Commonly known as: FEMARA  Take 1 tablet (2.5 mg total) by mouth daily.   NovoLIN  N FlexPen 100 UNIT/ML FlexPen Generic drug: Insulin  NPH (Human) (Isophane) Inject 10 Units into the skin in the morning and at bedtime.   Ozempic  (1 MG/DOSE) 4 MG/3ML Sopn Generic drug: Semaglutide  (1 MG/DOSE) Inject 1 mg into the skin once a week.   True Metrix Blood Glucose Test test strip Generic drug: glucose blood Use as instructed   TRUEdraw Lancing Device Misc Use as directed   TRUEplus Lancets 28G Misc 1 each in the morning and at bedtime.         ALLERGIES: Allergies  Allergen Reactions   Aspirin Rash     REVIEW OF SYSTEMS: A comprehensive ROS was conducted with the patient and is negative except as per HPI    OBJECTIVE:   VITAL SIGNS: LMP  (LMP Unknown) Comment: June 2022   PHYSICAL EXAM:  General: Pt appears well and is in NAD  Neck: General: Supple without adenopathy or carotid bruits. Thyroid : Thyroid  size normal.  No goiter or nodules appreciated.  Lungs: Clear with good BS bilat with no r  Heart: RRR   Abdomen: soft, nontender  Extremities:  Lower extremities - No pretibial edema  Neuro: MS is  good with appropriate affect, pt is alert and Ox3    DM foot exam: 02/02/2023  The skin of the feet is intact without sores or ulcerations. The pedal pulses are 2+ on right and 2+ on left. The sensation is intact to a screening 5.07, 10 gram monofilament bilaterally   DATA REVIEWED:  Lab Results  Component Value Date   HGBA1C 10.0 (H) 08/24/2023   HGBA1C 5.8 (A) 02/02/2023   HGBA1C 5.7 (A) 08/07/2022    Latest Reference Range & Units 08/24/23 17:08  Sodium 134 - 144 mmol/L 139  Potassium 3.5 - 5.2 mmol/L 4.5  Chloride 96 - 106 mmol/L 100  CO2 20 - 29 mmol/L 20  Glucose 70 - 99 mg/dL 161 (H)  BUN 6 - 24 mg/dL 10  Creatinine 0.96 - 0.45 mg/dL 4.09 (H)  Calcium  8.7 - 10.2 mg/dL 9.4  BUN/Creatinine Ratio 9 - 23  10  eGFR >59 mL/min/1.73 68  Total CHOL/HDL Ratio 0.0 - 4.4 ratio 5.0 (H)  Cholesterol, Total 100 - 199 mg/dL 811  HDL Cholesterol >91 mg/dL 32 (L)  MICROALB/CREAT RATIO 0 - 29 mg/g creat <6  Triglycerides 0 - 149 mg/dL 478 (H)  VLDL Cholesterol Cal 5 - 40 mg/dL 43 (H)  LDL Chol Calc (NIH) 0 - 99 mg/dL 85    Latest Reference Range & Units 08/24/23 17:08  Microalbumin, Urine Not Estab. ug/mL <3.0  MICROALB/CREAT RATIO 0 - 29 mg/g creat <6  Creatinine, Urine Not Estab. mg/dL 29.5     Old records , labs and images have been reviewed.    ASSESSMENT / PLAN / RECOMMENDATIONS:   1) Type 2 Diabetes Mellitus, Optimally controlled, Without complications - Most recent A1c of 10.0 %. Goal A1c < 7.0 %.    - A1c has increased due to lack of glycemic agents for approximately 3 months due to losing health insurance.  Patient reached out to our office in May, I did encourage the patient to reach out sooner in the future that way we could have further evaluate to put her on glycemic  agents or insulin  - She was recently started on Ozempic  through her PCPs office, I did ask her to continue to take 0.25 mg weekly for 4 weeks, then increase to 0.5 mg weekly - She has restarted  Farxiga  - I will switch NPH to insulin  analog as below   MEDICATIONS: Continue Farxiga  10 mg daily Continue Ozempic  0.25 mg weekly for 4 weeks, then increase to 0.5 mg weekly Stop NPH Start Lantus  26 units daily  EDUCATION / INSTRUCTIONS: BG monitoring instructions: Patient is instructed to check her blood sugars 1 times a day, fasting. Call [facility] if: BG persistently < 70  I reviewed the Rule of 15 for the treatment of hypoglycemia in detail with the patient. Literature supplied.   2) Diabetic complications:  Eye: Does not have known diabetic retinopathy.  Neuro/ Feet: Does not have known diabetic peripheral neuropathy. Renal: Patient does not have known baseline CKD. She is not on an ACEI/ARB at present.   3) Dyslipidemia :  -Triglycerides were elevated, LDL acceptable -Counseled about low-fat diet and increase fresh vegetables into a limited extent fresh fruits - Per triglyceride should improve with optimizing glucose control - She is on atorvastatin  10 mg daily  Follow-up in 6 months  Signed electronically by: Natale Bail, MD  [facility] Endocrinology  [facility] [address] [place] [place], [place] [address] Phone: [phone] FAX: [phone]   CC: Cleave Curling, MD [address] [place] [address] Phone: [phone]  Fax: [phone]    Return to Endocrinology clinic as below: Future Appointments  Date Time Provider Department Center  08/27/2023  8:10 AM Delan Ksiazek, Julian Obey, MD [facility] None  09/02/2023  4:00 PM Susanna Epley, FNP [facility] None  09/11/2023 12:30 PM [facility] None  10/13/2023  3:00 PM Cleave Curling, MD [facility] None  10/16/2023 12:30 PM [facility] None  11/13/2023 12:30 PM [facility] None  12/18/2023 12:30 PM [facility] None  01/15/2024 12:30 PM [facility] None   02/14/2024  1:45 PM Cameron Cea, MD [facility] None  02/14/2024  2:15 PM [facility] None

## 2023-08-27 NOTE — Telephone Encounter (Signed)
 Called patient and advised to stay away from sugary foods and beverages. Patient also encouraged to try Monistat OTC.

## 2023-08-28 ENCOUNTER — Other Ambulatory Visit: Payer: Self-pay | Admitting: Internal Medicine

## 2023-08-28 DIAGNOSIS — E782 Mixed hyperlipidemia: Secondary | ICD-10-CM

## 2023-08-29 ENCOUNTER — Encounter: Payer: Self-pay | Admitting: Internal Medicine

## 2023-08-29 DIAGNOSIS — N760 Acute vaginitis: Secondary | ICD-10-CM | POA: Insufficient documentation

## 2023-08-29 NOTE — Assessment & Plan Note (Signed)
 She is encouraged to strive for BMI less than 30 to decrease cardiac risk. Advised to aim for at least 150 minutes of exercise per week.

## 2023-08-29 NOTE — Assessment & Plan Note (Signed)
 Chronic, fair control. Goal BP<120/80.  She will continue with carvedilol  6.25mg  twice daily. - Follow low sodium diet - Consider adding ARB if BP remains elevated

## 2023-08-29 NOTE — Assessment & Plan Note (Signed)
 Chronic, ER visit notes reviewed.  She has appt scheduled with Endo next week. Recent hyperglycemia due to medication non-adherence, currently managed with insulin . Blood glucose levels remain elevated. Plan to transition to Ozempic  and Farxiga  for better glycemic control. Pending labs for further insulin  management. Discussed insulin  timing and hydration importance. - Start Ozempic  at 0.25 mg weekly, increase to 0.5 mg after two weeks if tolerated. - Continue Farxiga  as previously prescribed. - Check kidney function. - Consider long-acting insulin  based on lab results. - Educate on insulin  administration before meals. - Ensure adequate hydration while on Farxiga . - Confirms neg family history for thyroid  cancer

## 2023-08-29 NOTE — Assessment & Plan Note (Signed)
 Likely secondary to poorly controlled diabetes and hyperglycemia. - Prescribe Diflucan , take one dose today and another in two days.

## 2023-09-02 ENCOUNTER — Ambulatory Visit: Payer: Self-pay | Admitting: Nurse Practitioner

## 2023-09-03 ENCOUNTER — Ambulatory Visit: Admitting: Internal Medicine

## 2023-09-03 ENCOUNTER — Encounter: Payer: Self-pay | Admitting: Hematology and Oncology

## 2023-09-07 ENCOUNTER — Ambulatory Visit: Admitting: Obstetrics and Gynecology

## 2023-09-07 ENCOUNTER — Encounter: Payer: Self-pay | Admitting: Obstetrics and Gynecology

## 2023-09-07 VITALS — BP 100/70 | HR 90 | Temp 98.4°F | Ht 65.75 in | Wt 215.0 lb

## 2023-09-07 DIAGNOSIS — Z113 Encounter for screening for infections with a predominantly sexual mode of transmission: Secondary | ICD-10-CM

## 2023-09-07 DIAGNOSIS — B3731 Acute candidiasis of vulva and vagina: Secondary | ICD-10-CM

## 2023-09-07 DIAGNOSIS — N898 Other specified noninflammatory disorders of vagina: Secondary | ICD-10-CM

## 2023-09-07 LAB — WET PREP FOR TRICH, YEAST, CLUE

## 2023-09-07 MED ORDER — FLUCONAZOLE 150 MG PO TABS: 150.0000 mg | ORAL_TABLET | ORAL | 1 refills | Status: DC

## 2023-09-07 NOTE — Progress Notes (Signed)
 43 y.o. G1P0001 postmenopausal female with history of right breast cancer on letrozole  (dx 2021, s/p neoadjuvant chemotherapy, lumpectomy, adjuvant radiation, maintenance Herceptin ), CIN 1 (2024, AGC/ASC +HPV PAP) here for vaginal itching. Married.  Patient's last menstrual period was 08/29/2019 (approximate).   Vaginal itching for 2 weeks. Desires std testing today. No symptoms of std. Pt left urine sample just in case.  Treated with Diflucan  and Valtrex  over the last 2 weeks her symptoms have returned.   OB History  Gravida Para Term Preterm AB Living  1    0 1  SAB IAB Ectopic Multiple Live Births  0  0      # Outcome Date GA Lbr Len/2nd Weight Sex Type Anes PTL Lv  1 Gravida            Past Medical History:  Diagnosis Date   Abnormality of gait 08/02/2012   resolved per patient on 07/12/19   Anemia    iron taking in the past, not currently   Anxiety    Arthritis    spine- cerv.  & arm    Asthma    Bulging lumbar disc    Cancer (HCC)    Right Breast-starts chemo on 07/14/19   Chronic arthralgias of knees and hips 08/02/2012   Depression    Pt. took self off Celexa one month ago, states she did n't like how it made her feel .   Family history of cancer    GERD (gastroesophageal reflux disease)    diet controlled   Heart murmur    pt. been told that this is a fact, no echo or cardiac surveillance in the past    HLD (hyperlipidemia)    no meds, diet controlled   Hypertension    MRSA (methicillin resistant Staphylococcus aureus) 03/02/2005   Obesity    Spinal stenosis    Past Surgical History:  Procedure Laterality Date   ANTERIOR CERVICAL DECOMP/DISCECTOMY FUSION N/A 10/27/2012   Procedure: ANTERIOR CERVICAL DECOMPRESSION/DISCECTOMY FUSION 1 LEVEL;  Surgeon: Augustine Blocker, MD;  Location: [facility];  Service: Neurosurgery;  Laterality: N/A;  ANTERIOR CERVICAL DECOMPRESSION/DISCECTOMY FUSION 1 LEVEL   BREAST LUMPECTOMY WITH RADIOACTIVE SEED AND SENTINEL LYMPH NODE  BIOPSY Right 12/13/2019   Procedure: RIGHT BREAST LUMPECTOMY WITH RADIOACTIVE SEED AND RIGHT AXILLARY  SENTINEL LYMPH NODE BIOPSY, RIGHT AXILLARY NODE SEED GUIDED EXCISION, BLUE DYE INJECTION;  Surgeon: Enid Harry, MD;  Location: [facility];  Service: General;  Laterality: Right;  PEC BLOCK   BREAST REDUCTION SURGERY Bilateral 03/04/2021   Procedure: MAMMARY REDUCTION  (BREAST);  Surgeon: Contogiannis, Kevon Pellegrini, MD;  Location: [facility];  Service: Plastics;  Laterality: Bilateral;   CHALAZION EXCISION     L eye    PORTACATH PLACEMENT Left 07/13/2019   Procedure: INSERTION PORT-A-CATH WITH ULTRASOUND;  Surgeon: Enid Harry, MD;  Location: [facility];  Service: General;  Laterality: Left;   VAGINAL DELIVERY  2002   WISDOM TOOTH EXTRACTION     Current Outpatient Medications on File Prior to Visit  Medication Sig Dispense Refill   acetaminophen  (TYLENOL ) 500 MG tablet Take 500-1,000 mg by mouth daily as needed for pain.     albuterol  (VENTOLIN  HFA) 108 (90 Base) MCG/ACT inhaler Inhale 2 puffs into the lungs every 6 (six) hours as needed for wheezing or shortness of breath. 18 g 2   atorvastatin  (LIPITOR) 10 MG tablet Take 1 tablet by mouth once daily 90 tablet 0   baclofen  (LIORESAL ) 10  MG tablet TAKE 1 TABLET BY MOUTH THREE TIMES DAILY 90 tablet 0   blood glucose meter kit and supplies KIT Dispense based on patient and insurance preference. Use up to four times daily as directed. (FOR ICD-137). For QAC - HS accuchecks.  Dispense any approved glucometer with 1 month testing supplies. 1 each 1   Blood Glucose Monitoring Suppl (ACCU-CHEK GUIDE) w/Device KIT Use to check blood sugar 2 times daily 1 kit 0   buPROPion  (WELLBUTRIN  XL) 150 MG 24 hr tablet TAKE 1 TABLET BY MOUTH ONCE DAILY IN THE MORNING 90 tablet 0   carvedilol  (COREG ) 6.25 MG tablet TAKE 1 TABLET BY MOUTH TWICE DAILY WITH A MEAL 180 tablet 0   dapagliflozin  propanediol (FARXIGA ) 10 MG TABS tablet Take  1 tablet (10 mg total) by mouth daily. 90 tablet 3   glucose blood test strip Use as instructed 100 each 12   insulin  glargine (LANTUS  SOLOSTAR) 100 UNIT/ML Solostar Pen Inject 26 Units into the skin daily. 30 mL 3   Insulin  Pen Needle 32G X 4 MM MISC Use morning and at bedtime. 100 each 3   Lancet Devices (TRUEDRAW LANCING DEVICE) MISC Use as directed 1 each 0   letrozole  (FEMARA ) 2.5 MG tablet Take 1 tablet (2.5 mg total) by mouth daily. 90 tablet 3   Semaglutide ,0.25 or 0.5MG /DOS, (OZEMPIC , 0.25 OR 0.5 MG/DOSE,) 2 MG/3ML SOPN Inject 0.5 mg into the skin once a week. 9 mL 3   TRUEplus Lancets 28G MISC 1 each in the morning and at bedtime. 100 each 0   valACYclovir  (VALTREX ) 1000 MG tablet Take 1,000 mg by mouth 2 (two) times daily.     [DISCONTINUED] prochlorperazine  (COMPAZINE ) 10 MG tablet Take 1 tablet (10 mg total) by mouth every 6 (six) hours as needed (Nausea or vomiting). 30 tablet 1   Current Facility-Administered Medications on File Prior to Visit  Medication Dose Route Frequency Provider Last Rate Last Admin   lidocaine  (PF) (XYLOCAINE ) 1 % injection 10 mL  10 mL Intradermal Once        Allergies  Allergen Reactions   Aspirin Rash      PE Today's Vitals   09/07/23 1529  BP: 100/70  Pulse: 90  Temp: 98.4 F (36.9 C)  TempSrc: Oral  SpO2: 97%  Weight: 215 lb (97.5 kg)  Height: 5' 5.75" (1.67 m)   Body mass index is 34.97 kg/m.  Physical Exam Vitals reviewed. Exam conducted with a chaperone present.  Constitutional:      General: She is not in acute distress.    Appearance: Normal appearance.  HENT:     Head: Normocephalic and atraumatic.     Nose: Nose normal.  Eyes:     Extraocular Movements: Extraocular movements intact.     Conjunctiva/sclera: Conjunctivae normal.  Pulmonary:     Effort: Pulmonary effort is normal.  Genitourinary:    General: Normal vulva.     Exam position: Lithotomy position.     Vagina: Vaginal discharge present.     Cervix:  Normal. No cervical motion tenderness, discharge or lesion.     Uterus: Normal. Not enlarged and not tender.      Adnexa: Right adnexa normal and left adnexa normal.  Musculoskeletal:        General: Normal range of motion.     Cervical back: Normal range of motion.  Neurological:     General: No focal deficit present.     Mental Status: She is alert.  Psychiatric:  Mood and Affect: Mood normal.        Behavior: Behavior normal.      Assessment and Plan:        Vaginal discharge -     WET PREP FOR TRICH, YEAST, CLUE  Screen for STD (sexually transmitted disease) -     SURESWAB CT/NG/T. vaginalis  Yeast vaginitis  Other orders -     Fluconazole ; Take 1 tablet (150 mg total) by mouth every 3 (three) days.  Dispense: 3 tablet; Refill: 1    Lost insurance for 3 months, last A1c 10, well controlled in the fall Discussed this may be contributing to poorly controlled symptoms. Back on medications for T2DM. Will provide refill. Romaine Closs, MD

## 2023-09-08 LAB — SURESWAB CT/NG/T. VAGINALIS
C. trachomatis RNA, TMA: NOT DETECTED
N. gonorrhoeae RNA, TMA: NOT DETECTED
Trichomonas vaginalis RNA: NOT DETECTED

## 2023-09-09 ENCOUNTER — Ambulatory Visit: Payer: Self-pay | Admitting: Obstetrics and Gynecology

## 2023-09-10 ENCOUNTER — Encounter: Payer: Self-pay | Admitting: Hematology and Oncology

## 2023-09-11 ENCOUNTER — Inpatient Hospital Stay: Payer: Self-pay

## 2023-09-17 ENCOUNTER — Inpatient Hospital Stay

## 2023-09-18 ENCOUNTER — Inpatient Hospital Stay: Attending: Hematology and Oncology

## 2023-09-18 VITALS — BP 131/90 | HR 71 | Temp 98.2°F | Resp 18

## 2023-09-18 DIAGNOSIS — Z170 Estrogen receptor positive status [ER+]: Secondary | ICD-10-CM | POA: Insufficient documentation

## 2023-09-18 DIAGNOSIS — C50411 Malignant neoplasm of upper-outer quadrant of right female breast: Secondary | ICD-10-CM | POA: Insufficient documentation

## 2023-09-18 DIAGNOSIS — Z5111 Encounter for antineoplastic chemotherapy: Secondary | ICD-10-CM | POA: Diagnosis present

## 2023-09-18 DIAGNOSIS — Z95828 Presence of other vascular implants and grafts: Secondary | ICD-10-CM

## 2023-09-18 MED ORDER — GOSERELIN ACETATE 3.6 MG ~~LOC~~ IMPL
3.6000 mg | DRUG_IMPLANT | Freq: Once | SUBCUTANEOUS | Status: AC
  Administered 2023-09-18: 3.6 mg via SUBCUTANEOUS
  Filled 2023-09-18: qty 3.6

## 2023-09-27 NOTE — Telephone Encounter (Signed)
 Pharmacy Patient Advocate Encounter  Received notification from [facility] that Prior Authorization for Ozempic  has been APPROVED from 09/27/23 to 08/26/24

## 2023-10-04 ENCOUNTER — Ambulatory Visit: Admitting: Internal Medicine

## 2023-10-13 ENCOUNTER — Encounter: Payer: Self-pay | Admitting: Internal Medicine

## 2023-10-16 ENCOUNTER — Inpatient Hospital Stay: Payer: Self-pay

## 2023-10-16 ENCOUNTER — Inpatient Hospital Stay: Attending: Hematology and Oncology

## 2023-10-16 VITALS — BP 133/97 | HR 88 | Temp 97.2°F | Resp 18

## 2023-10-16 DIAGNOSIS — C50411 Malignant neoplasm of upper-outer quadrant of right female breast: Secondary | ICD-10-CM | POA: Insufficient documentation

## 2023-10-16 DIAGNOSIS — Z5111 Encounter for antineoplastic chemotherapy: Secondary | ICD-10-CM | POA: Insufficient documentation

## 2023-10-16 DIAGNOSIS — Z170 Estrogen receptor positive status [ER+]: Secondary | ICD-10-CM | POA: Diagnosis not present

## 2023-10-16 DIAGNOSIS — Z95828 Presence of other vascular implants and grafts: Secondary | ICD-10-CM

## 2023-10-16 MED ORDER — GOSERELIN ACETATE 3.6 MG ~~LOC~~ IMPL
3.6000 mg | DRUG_IMPLANT | Freq: Once | SUBCUTANEOUS | Status: AC
Start: 2023-10-16 — End: 2023-10-16
  Administered 2023-10-16: 3.6 mg via SUBCUTANEOUS
  Filled 2023-10-16: qty 3.6

## 2023-10-29 ENCOUNTER — Telehealth: Payer: Self-pay | Admitting: Pharmacy Technician

## 2023-10-29 ENCOUNTER — Other Ambulatory Visit (HOSPITAL_COMMUNITY): Payer: Self-pay

## 2023-10-29 ENCOUNTER — Encounter: Payer: Self-pay | Admitting: Hematology and Oncology

## 2023-10-29 MED ORDER — INSULIN GLARGINE-YFGN 100 UNIT/ML ~~LOC~~ SOPN: 26.0000 [IU] | PEN_INJECTOR | Freq: Every day | SUBCUTANEOUS | 3 refills | Status: AC

## 2023-10-29 NOTE — Telephone Encounter (Signed)
 Medication was changed to Semglee .

## 2023-10-29 NOTE — Telephone Encounter (Signed)
 Pharmacy Patient Advocate Encounter   Received notification from CoverMyMeds that prior authorization for Lantus  SoloStar 100UNIT/ML pen-injectors is required/requested.   Insurance verification completed.   The patient is insured through [facility] .   Per test claim:  SEMGLEE  is preferred by the insurance.  If suggested medication is appropriate, Please send in a new RX and discontinue this one. If not, please advise as to why it's not appropriate so that we may request a Prior Authorization. Please note, some preferred medications may still require a PA.  If the suggested medications have not been trialed and there are no contraindications to their use, the PA will not be submitted, as it will not be approved.

## 2023-11-02 ENCOUNTER — Encounter: Payer: Self-pay | Admitting: Internal Medicine

## 2023-11-10 ENCOUNTER — Other Ambulatory Visit: Payer: Self-pay | Admitting: Hematology and Oncology

## 2023-11-10 ENCOUNTER — Other Ambulatory Visit: Payer: Self-pay | Admitting: Internal Medicine

## 2023-11-10 DIAGNOSIS — F320 Major depressive disorder, single episode, mild: Secondary | ICD-10-CM

## 2023-11-13 ENCOUNTER — Inpatient Hospital Stay: Payer: Self-pay

## 2023-11-13 NOTE — Progress Notes (Deleted)
 Pt had app. For injection at 12:30. Attempted to call pt with no answer. This nurse left a voicemail. App. Cancelled at [employer]

## 2023-11-17 ENCOUNTER — Inpatient Hospital Stay: Attending: Hematology and Oncology

## 2023-11-17 VITALS — BP 124/72 | HR 74 | Resp 18

## 2023-11-17 DIAGNOSIS — Z170 Estrogen receptor positive status [ER+]: Secondary | ICD-10-CM | POA: Diagnosis not present

## 2023-11-17 DIAGNOSIS — C50911 Malignant neoplasm of unspecified site of right female breast: Secondary | ICD-10-CM | POA: Insufficient documentation

## 2023-11-17 DIAGNOSIS — Z5111 Encounter for antineoplastic chemotherapy: Secondary | ICD-10-CM | POA: Insufficient documentation

## 2023-11-17 DIAGNOSIS — Z95828 Presence of other vascular implants and grafts: Secondary | ICD-10-CM

## 2023-11-17 MED ORDER — GOSERELIN ACETATE 3.6 MG ~~LOC~~ IMPL
3.6000 mg | DRUG_IMPLANT | Freq: Once | SUBCUTANEOUS | Status: AC
  Administered 2023-11-17: 3.6 mg via SUBCUTANEOUS
  Filled 2023-11-17 (×2): qty 3.6

## 2023-11-30 ENCOUNTER — Ambulatory Visit: Admitting: Internal Medicine

## 2023-11-30 NOTE — Progress Notes (Deleted)
 Name: Heidi Costa  MRN/ DOB: [id number], 08/26/1980   Age/ Sex: 43 y.o., female    PCP: Jarold Medici, MD   Reason for Endocrinology Evaluation: Type 2 Diabetes Mellitus     Date of Initial Endocrinology Visit: 09/23/2021    PATIENT IDENTIFIER: [name] is a 43 y.o. female with a past medical history of Hx of breast ca ( S/P lumpectomy 2021) and chemo. The patient presented for initial endocrinology clinic visit on 09/23/2021 for consultative assistance with her diabetes management.    HPI: Heidi Costa was    Diagnosed with DM 02/2021 when she presented to the ED with symptomatic hyperglycemia.  She was noted to be in DKA, with an elevated anion gap at 25 and elevated  beta hydroxybutyrate> mmol/L. She was initially discharged on insulin  and Metformin  .  Prior Medications tried/Intolerance: Metformin , Janumet  Hemoglobin A1c has ranged from 6.1% in 2023, peaking at 10.5% in 2022.  On her initial visit to our clinic she had an A1c of 6.1%, we continued Farxiga  and Ozempic   The patient was started on NPH in May, 2025 due to hyperglycemia and A1c of 10.0%   SUBJECTIVE:   During the last visit (08/27/2023): A1c 10.0%     Today (11/30/23): Heidi Costa is here for follow-up on diabetes management.  She checks her blood sugars 2-4 x a day . The patient has not had hypoglycemic episodes since the last clinic visit.  She continues to follow-up with oncology, on chemotherapy Weight remains stable   Denies nausea, vomiting Continues with constipation  NO recent UTI   HOME DIABETES REGIMEN: Farxiga  10 mg Ozempic  0.5 mg weekly Lantus  26 daily  Statin: Yes ACE-I/ARB: no Prior Diabetic Education:   METER DOWNLOAD SUMMARY: unable to download 30 day average 429 Range 243 - 589  mg/dL    DIABETIC COMPLICATIONS: Microvascular complications:   Denies: CKD Last eye exam: Completed 03/2022  Macrovascular complications:   Denies: CAD, PVD, CVA   PAST  HISTORY: Past Medical History:  Past Medical History:  Diagnosis Date   Abnormality of gait 08/02/2012   resolved per patient on 07/12/19   Anemia    iron taking in the past, not currently   Anxiety    Arthritis    spine- cerv.  & arm    Asthma    Bulging lumbar disc    Cancer (HCC)    Right Breast-starts chemo on 07/14/19   Chronic arthralgias of knees and hips 08/02/2012   Depression    Pt. took self off Celexa one month ago, states she did n't like how it made her feel .   Family history of cancer    GERD (gastroesophageal reflux disease)    diet controlled   Heart murmur    pt. been told that this is a fact, no echo or cardiac surveillance in the past    HLD (hyperlipidemia)    no meds, diet controlled   Hypertension    MRSA (methicillin resistant Staphylococcus aureus) 03/02/2005   Obesity    Spinal stenosis    Past Surgical History:  Past Surgical History:  Procedure Laterality Date   ANTERIOR CERVICAL DECOMP/DISCECTOMY FUSION N/A 10/27/2012   Procedure: ANTERIOR CERVICAL DECOMPRESSION/DISCECTOMY FUSION 1 LEVEL;  Surgeon: Darina MALVA Boehringer, MD;  Location: [facility];  Service: Neurosurgery;  Laterality: N/A;  ANTERIOR CERVICAL DECOMPRESSION/DISCECTOMY FUSION 1 LEVEL   BREAST LUMPECTOMY WITH RADIOACTIVE SEED AND SENTINEL LYMPH NODE BIOPSY Right 12/13/2019   Procedure: RIGHT BREAST LUMPECTOMY WITH  RADIOACTIVE SEED AND RIGHT AXILLARY  SENTINEL LYMPH NODE BIOPSY, RIGHT AXILLARY NODE SEED GUIDED EXCISION, BLUE DYE INJECTION;  Surgeon: Ebbie Cough, MD;  Location: [facility];  Service: General;  Laterality: Right;  PEC BLOCK   BREAST REDUCTION SURGERY Bilateral 03/04/2021   Procedure: MAMMARY REDUCTION  (BREAST);  Surgeon: Contogiannis, Ronal Caldron, MD;  Location: [facility];  Service: Plastics;  Laterality: Bilateral;   CHALAZION EXCISION     L eye    PORTACATH PLACEMENT Left 07/13/2019   Procedure: INSERTION PORT-A-CATH WITH ULTRASOUND;  Surgeon:  Ebbie Cough, MD;  Location: [facility];  Service: General;  Laterality: Left;   VAGINAL DELIVERY  2002   WISDOM TOOTH EXTRACTION      Social History:  reports that she has been smoking cigarettes. She started smoking about 13 years ago. She has a 10 pack-year smoking history. She has never been exposed to tobacco smoke. She has never used smokeless tobacco. She reports current alcohol use. She reports current drug use. Drug: Marijuana. Family History:  Family History  Problem Relation Age of Onset   Osteoarthritis Mother    Hypertension Mother    Diabetes Mother    Hypertension Father    Gout Father    Stroke Father 22       embolic   Diabetes Half-Brother    Hypertension Half-Brother    Heart failure Half-Brother 58   Kidney disease Half-Brother    Diabetes Paternal Grandfather    Cancer Paternal Grandfather 62       breast cancer     HOME MEDICATIONS: Allergies as of 11/30/2023       Reactions   Aspirin Rash        Medication List        Accurate as of November 30, 2023 12:41 PM. If you have any questions, ask your nurse or doctor.          Accu-Chek Guide w/Device Kit Use to check blood sugar 2 times daily   acetaminophen  500 MG tablet Commonly known as: TYLENOL  Take 500-1,000 mg by mouth daily as needed for pain.   albuterol  108 (90 Base) MCG/ACT inhaler Commonly known as: VENTOLIN  HFA Inhale 2 puffs into the lungs every 6 (six) hours as needed for wheezing or shortness of breath.   atorvastatin  10 MG tablet Commonly known as: LIPITOR Take 1 tablet by mouth once daily   baclofen  10 MG tablet Commonly known as: LIORESAL  TAKE 1 TABLET BY MOUTH THREE TIMES DAILY   blood glucose meter kit and supplies Kit Dispense based on patient and insurance preference. Use up to four times daily as directed. (FOR ICD-137). For QAC - HS accuchecks.  Dispense any approved glucometer with 1 month testing supplies.   buPROPion  150 MG 24 hr tablet Commonly known as:  WELLBUTRIN  XL TAKE 1 TABLET BY MOUTH ONCE DAILY IN THE MORNING   carvedilol  6.25 MG tablet Commonly known as: COREG  TAKE 1 TABLET BY MOUTH TWICE DAILY WITH A MEAL   dapagliflozin  propanediol 10 MG Tabs tablet Commonly known as: Farxiga  Take 1 tablet (10 mg total) by mouth daily.   fluconazole  150 MG tablet Commonly known as: Diflucan  Take 1 tablet (150 mg total) by mouth every 3 (three) days.   insulin  glargine-yfgn 100 UNIT/ML Pen Commonly known as: SEMGLEE  Inject 26 Units into the skin daily.   Insupen Pen Needles 32G X 4 MM Misc Generic drug: Insulin  Pen Needle Use morning and at bedtime.   letrozole  2.5 MG  tablet Commonly known as: FEMARA  Take 1 tablet (2.5 mg total) by mouth daily.   Ozempic  (0.25 or 0.5 MG/DOSE) 2 MG/3ML Sopn Generic drug: Semaglutide (0.25 or 0.5MG /DOS) Inject 0.5 mg into the skin once a week.   True Metrix Blood Glucose Test test strip Generic drug: glucose blood Use as instructed   TRUEdraw Lancing Device Misc Use as directed   TRUEplus Lancets 28G Misc 1 each in the morning and at bedtime.   valACYclovir  1000 MG tablet Commonly known as: VALTREX  Take 1,000 mg by mouth 2 (two) times daily.         ALLERGIES: Allergies  Allergen Reactions   Aspirin Rash     REVIEW OF SYSTEMS: A comprehensive ROS was conducted with the patient and is negative except as per HPI    OBJECTIVE:   VITAL SIGNS: LMP 08/29/2019 (Approximate)    PHYSICAL EXAM:  General: Pt appears well and is in NAD  Neck: General: Supple without adenopathy or carotid bruits. Thyroid : Thyroid  size normal.  No goiter or nodules appreciated.  Lungs: Clear with good BS bilat with no r  Heart: RRR   Abdomen: soft, nontender  Extremities:  Lower extremities - No pretibial edema  Neuro: MS is good with appropriate affect, pt is alert and Ox3    DM foot exam: 02/02/2023  The skin of the feet is intact without sores or ulcerations. The pedal pulses are 2+ on right  and 2+ on left. The sensation is intact to a screening 5.07, 10 gram monofilament bilaterally   DATA REVIEWED:  Lab Results  Component Value Date   HGBA1C 10.0 (H) 08/24/2023   HGBA1C 5.8 (A) 02/02/2023   HGBA1C 5.7 (A) 08/07/2022    Latest Reference Range & Units 08/24/23 17:08  Sodium 134 - 144 mmol/L 139  Potassium 3.5 - 5.2 mmol/L 4.5  Chloride 96 - 106 mmol/L 100  CO2 20 - 29 mmol/L 20  Glucose 70 - 99 mg/dL 669 (H)  BUN 6 - 24 mg/dL 10  Creatinine 9.42 - 8.99 mg/dL 8.94 (H)  Calcium  8.7 - 10.2 mg/dL 9.4  BUN/Creatinine Ratio 9 - 23  10  eGFR >59 mL/min/1.73 68  Total CHOL/HDL Ratio 0.0 - 4.4 ratio 5.0 (H)  Cholesterol, Total 100 - 199 mg/dL 839  HDL Cholesterol >60 mg/dL 32 (L)  MICROALB/CREAT RATIO 0 - 29 mg/g creat <6  Triglycerides 0 - 149 mg/dL 741 (H)  VLDL Cholesterol Cal 5 - 40 mg/dL 43 (H)  LDL Chol Calc (NIH) 0 - 99 mg/dL 85    Latest Reference Range & Units 08/24/23 17:08  Microalbumin, Urine Not Estab. ug/mL <3.0  MICROALB/CREAT RATIO 0 - 29 mg/g creat <6  Creatinine, Urine Not Estab. mg/dL 50.7     Old records , labs and images have been reviewed.    ASSESSMENT / PLAN / RECOMMENDATIONS:   1) Type 2 Diabetes Mellitus, Optimally controlled, Without complications - Most recent A1c of 10.0 %. Goal A1c < 7.0 %.    - A1c has increased due to lack of glycemic agents for approximately 3 months due to losing health insurance.  Patient reached out to our office in May, I did encourage the patient to reach out sooner in the future that way we could have further evaluate to put her on glycemic agents or insulin  - She was recently started on Ozempic  through her PCPs office, I did ask her to continue to take 0.25 mg weekly for 4 weeks, then increase to 0.5 mg weekly -  She has restarted Farxiga  - I will switch NPH to insulin  analog as below   MEDICATIONS: Continue Farxiga  10 mg daily Continue Ozempic  0.25 mg weekly for 4 weeks, then increase to 0.5 mg  weekly Start Lantus  26 units daily  EDUCATION / INSTRUCTIONS: BG monitoring instructions: Patient is instructed to check her blood sugars 1 times a day, fasting. Call [facility] if: BG persistently < 70  I reviewed the Rule of 15 for the treatment of hypoglycemia in detail with the patient. Literature supplied.   2) Diabetic complications:  Eye: Does not have known diabetic retinopathy.  Neuro/ Feet: Does not have known diabetic peripheral neuropathy. Renal: Patient does not have known baseline CKD. She is not on an ACEI/ARB at present.   3) Dyslipidemia :  -Triglycerides were elevated, LDL acceptable -Counseled about low-fat diet and increase fresh vegetables into a limited extent fresh fruits - Per triglyceride should improve with optimizing glucose control - She is on atorvastatin  10 mg daily  Follow-up in 6 months  Signed electronically by: Stefano Redgie Butts, MD  [facility] Endocrinology  [facility] [address] [place] [place], [place] [address] Phone: [phone] FAX: [phone]   CC: Jarold Medici, MD [address] [place] [address] Phone: [phone]  Fax: [phone]    Return to Endocrinology clinic as below: Future Appointments  Date Time Provider Department Center  11/30/2023  1:40 PM Danta Baumgardner, Donell Redgie, MD [facility] None  12/11/2023 12:30 PM [facility] None  01/08/2024 12:30 PM [facility] None  02/14/2024  1:45 PM Odean Potts, MD [facility] None  02/14/2024  2:15 PM [facility] None  04/19/2024 10:20 AM Jarold Medici, MD [facility] [phone] [place]

## 2023-12-11 ENCOUNTER — Inpatient Hospital Stay

## 2023-12-15 ENCOUNTER — Inpatient Hospital Stay

## 2023-12-18 ENCOUNTER — Inpatient Hospital Stay: Attending: Hematology and Oncology

## 2023-12-18 ENCOUNTER — Inpatient Hospital Stay: Payer: Self-pay

## 2023-12-18 VITALS — BP 140/87 | HR 84 | Temp 97.6°F | Resp 17

## 2023-12-18 DIAGNOSIS — Z5111 Encounter for antineoplastic chemotherapy: Secondary | ICD-10-CM | POA: Diagnosis not present

## 2023-12-18 DIAGNOSIS — Z95828 Presence of other vascular implants and grafts: Secondary | ICD-10-CM

## 2023-12-18 DIAGNOSIS — Z170 Estrogen receptor positive status [ER+]: Secondary | ICD-10-CM

## 2023-12-18 DIAGNOSIS — C50911 Malignant neoplasm of unspecified site of right female breast: Secondary | ICD-10-CM | POA: Insufficient documentation

## 2023-12-18 MED ORDER — GOSERELIN ACETATE 3.6 MG ~~LOC~~ IMPL
3.6000 mg | DRUG_IMPLANT | Freq: Once | SUBCUTANEOUS | Status: AC
  Administered 2023-12-18: 3.6 mg via SUBCUTANEOUS
  Filled 2023-12-18: qty 3.6

## 2024-01-08 ENCOUNTER — Inpatient Hospital Stay

## 2024-01-15 ENCOUNTER — Inpatient Hospital Stay: Attending: Hematology and Oncology

## 2024-01-15 ENCOUNTER — Inpatient Hospital Stay: Payer: Self-pay

## 2024-01-15 DIAGNOSIS — C50411 Malignant neoplasm of upper-outer quadrant of right female breast: Secondary | ICD-10-CM | POA: Insufficient documentation

## 2024-01-15 DIAGNOSIS — Z170 Estrogen receptor positive status [ER+]: Secondary | ICD-10-CM | POA: Insufficient documentation

## 2024-01-15 DIAGNOSIS — Z1731 Human epidermal growth factor receptor 2 positive status: Secondary | ICD-10-CM | POA: Insufficient documentation

## 2024-01-15 DIAGNOSIS — Z5111 Encounter for antineoplastic chemotherapy: Secondary | ICD-10-CM | POA: Insufficient documentation

## 2024-01-15 DIAGNOSIS — Z1722 Progesterone receptor negative status: Secondary | ICD-10-CM | POA: Insufficient documentation

## 2024-01-22 ENCOUNTER — Inpatient Hospital Stay

## 2024-01-22 VITALS — BP 137/88 | HR 101 | Temp 98.2°F | Resp 20

## 2024-01-22 DIAGNOSIS — Z95828 Presence of other vascular implants and grafts: Secondary | ICD-10-CM

## 2024-01-22 DIAGNOSIS — Z1722 Progesterone receptor negative status: Secondary | ICD-10-CM | POA: Diagnosis not present

## 2024-01-22 DIAGNOSIS — C50411 Malignant neoplasm of upper-outer quadrant of right female breast: Secondary | ICD-10-CM

## 2024-01-22 DIAGNOSIS — Z1731 Human epidermal growth factor receptor 2 positive status: Secondary | ICD-10-CM | POA: Diagnosis not present

## 2024-01-22 DIAGNOSIS — Z170 Estrogen receptor positive status [ER+]: Secondary | ICD-10-CM | POA: Diagnosis not present

## 2024-01-22 DIAGNOSIS — Z5111 Encounter for antineoplastic chemotherapy: Secondary | ICD-10-CM | POA: Diagnosis present

## 2024-01-22 MED ORDER — GOSERELIN ACETATE 3.6 MG ~~LOC~~ IMPL
3.6000 mg | DRUG_IMPLANT | Freq: Once | SUBCUTANEOUS | Status: AC
  Administered 2024-01-22: 3.6 mg via SUBCUTANEOUS
  Filled 2024-01-22: qty 3.6

## 2024-02-02 ENCOUNTER — Encounter: Payer: Self-pay | Admitting: Internal Medicine

## 2024-02-02 ENCOUNTER — Ambulatory Visit (INDEPENDENT_AMBULATORY_CARE_PROVIDER_SITE_OTHER): Admitting: Internal Medicine

## 2024-02-02 VITALS — BP 158/90 | Ht 67.75 in | Wt 233.0 lb

## 2024-02-02 DIAGNOSIS — Z7985 Long-term (current) use of injectable non-insulin antidiabetic drugs: Secondary | ICD-10-CM

## 2024-02-02 DIAGNOSIS — Z794 Long term (current) use of insulin: Secondary | ICD-10-CM | POA: Diagnosis not present

## 2024-02-02 DIAGNOSIS — Z7984 Long term (current) use of oral hypoglycemic drugs: Secondary | ICD-10-CM

## 2024-02-02 DIAGNOSIS — E119 Type 2 diabetes mellitus without complications: Secondary | ICD-10-CM | POA: Diagnosis not present

## 2024-02-02 DIAGNOSIS — E785 Hyperlipidemia, unspecified: Secondary | ICD-10-CM | POA: Diagnosis not present

## 2024-02-02 LAB — POCT GLYCOSYLATED HEMOGLOBIN (HGB A1C): Hemoglobin A1C: 6.1 % — AB (ref 4.0–5.6)

## 2024-02-02 MED ORDER — TIRZEPATIDE 5 MG/0.5ML ~~LOC~~ SOAJ
5.0000 mg | SUBCUTANEOUS | 3 refills | Status: AC
Start: 2024-02-02 — End: ?

## 2024-02-02 NOTE — Progress Notes (Signed)
 Name: Heidi Costa  MRN/ DOB: [id number], 09-24-1980   Age/ Sex: 43 y.o., female    PCP: Jarold Medici, MD   Reason for Endocrinology Evaluation: Type 2 Diabetes Mellitus     Date of Initial Endocrinology Visit: 09/23/2021    PATIENT IDENTIFIER: Heidi Costa is a 43 y.o. female with a past medical history of Hx of breast ca ( S/P lumpectomy 2021) and chemo. The patient presented for initial endocrinology clinic visit on 09/23/2021 for consultative assistance with her diabetes management.    HPI: [name] was    Diagnosed with DM 02/2021 when she presented to the ED with symptomatic hyperglycemia.  She was noted to be in DKA, with an elevated anion gap at 25 and elevated  beta hydroxybutyrate> mmol/L. She was initially discharged on insulin  and Metformin  .  Prior Medications tried/Intolerance: Metformin , Janumet  Hemoglobin A1c has ranged from 6.1% in 2023, peaking at 10.5% in 2022.  On her initial visit to our clinic she had an A1c of 6.1%, we continued Farxiga  and Ozempic   Due to loss of health insurance, she was started on NPH in May, 2025.  She was subsequently switched to basal insulin  as she has not been able to get the Farxiga  nor the Ozempic  at the time, A1c was 10.0%   SUBJECTIVE:   During the last visit (08/27/2023): A1c 10.0%     Today (02/02/24): [name] is here for follow-up on diabetes management.  She checks her blood sugars rarely. The patient has not had hypoglycemic episodes since the last clinic visit.  Patient continues to follow-up with oncology for history of breast cancer, on chemotherapy  She has ran out of Farxiga  and Ozempic  for 1-2 week No nausea or vomiting  No constipation but has occasional  diarrhea  She eats 3 meals a day    HOME DIABETES REGIMEN: Farxiga  10 mg-not taking Ozempic  0.5 mg weekly-not taking Semglee   26 units daily     Statin: Yes ACE-I/ARB: no Prior Diabetic Education:   METER DOWNLOAD SUMMARY: 10/22 -  02/02/2024 Average 119 MGs/DL    DIABETIC COMPLICATIONS: Microvascular complications:   Denies: CKD Last eye exam: Completed 03/2023  Macrovascular complications:   Denies: CAD, PVD, CVA   PAST HISTORY: Past Medical History:  Past Medical History:  Diagnosis Date   Abnormality of gait 08/02/2012   resolved per patient on 07/12/19   Anemia    iron taking in the past, not currently   Anxiety    Arthritis    spine- cerv.  & arm    Asthma    Bulging lumbar disc    Cancer (HCC)    Right Breast-starts chemo on 07/14/19   Chronic arthralgias of knees and hips 08/02/2012   Depression    Pt. took self off Celexa one month ago, states she did n't like how it made her feel .   Family history of cancer    GERD (gastroesophageal reflux disease)    diet controlled   Heart murmur    pt. been told that this is a fact, no echo or cardiac surveillance in the past    HLD (hyperlipidemia)    no meds, diet controlled   Hypertension    MRSA (methicillin resistant Staphylococcus aureus) 03/02/2005   Obesity    Spinal stenosis    Past Surgical History:  Past Surgical History:  Procedure Laterality Date   ANTERIOR CERVICAL DECOMP/DISCECTOMY FUSION N/A 10/27/2012   Procedure: ANTERIOR CERVICAL DECOMPRESSION/DISCECTOMY FUSION 1 LEVEL;  Surgeon:  Darina MALVA Boehringer, MD;  Location: [facility];  Service: Neurosurgery;  Laterality: N/A;  ANTERIOR CERVICAL DECOMPRESSION/DISCECTOMY FUSION 1 LEVEL   BREAST LUMPECTOMY WITH RADIOACTIVE SEED AND SENTINEL LYMPH NODE BIOPSY Right 12/13/2019   Procedure: RIGHT BREAST LUMPECTOMY WITH RADIOACTIVE SEED AND RIGHT AXILLARY  SENTINEL LYMPH NODE BIOPSY, RIGHT AXILLARY NODE SEED GUIDED EXCISION, BLUE DYE INJECTION;  Surgeon: Ebbie Cough, MD;  Location: [facility];  Service: General;  Laterality: Right;  PEC BLOCK   BREAST REDUCTION SURGERY Bilateral 03/04/2021   Procedure: MAMMARY REDUCTION  (BREAST);  Surgeon: Contogiannis, Ronal Caldron, MD;   Location: [facility];  Service: Plastics;  Laterality: Bilateral;   CHALAZION EXCISION     L eye    PORTACATH PLACEMENT Left 07/13/2019   Procedure: INSERTION PORT-A-CATH WITH ULTRASOUND;  Surgeon: Ebbie Cough, MD;  Location: [facility];  Service: General;  Laterality: Left;   VAGINAL DELIVERY  2002   WISDOM TOOTH EXTRACTION      Social History:  reports that she has been smoking cigarettes. She started smoking about 13 years ago. She has a 10 pack-year smoking history. She has never been exposed to tobacco smoke. She has never used smokeless tobacco. She reports current alcohol use. She reports current drug use. Drug: Marijuana. Family History:  Family History  Problem Relation Age of Onset   Osteoarthritis Mother    Hypertension Mother    Diabetes Mother    Hypertension Father    Gout Father    Stroke Father 39       embolic   Diabetes Half-Brother    Hypertension Half-Brother    Heart failure Half-Brother 40   Kidney disease Half-Brother    Diabetes Paternal Grandfather    Cancer Paternal Grandfather 32       breast cancer     HOME MEDICATIONS: Allergies as of 02/02/2024       Reactions   Aspirin Rash        Medication List        Accurate as of February 02, 2024 11:31 AM. If you have any questions, ask your nurse or doctor.          STOP taking these medications    dapagliflozin  propanediol 10 MG Tabs tablet Commonly known as: Farxiga  Stopped by: Donell PARAS Maxine Huynh   Ozempic  (0.25 or 0.5 MG/DOSE) 2 MG/3ML Sopn Generic drug: Semaglutide (0.25 or 0.5MG /DOS) Stopped by: Donell PARAS Lilygrace Rodick       TAKE these medications    Accu-Chek Guide w/Device Kit Use to check blood sugar 2 times daily   acetaminophen  500 MG tablet Commonly known as: TYLENOL  Take 500-1,000 mg by mouth daily as needed for pain.   albuterol  108 (90 Base) MCG/ACT inhaler Commonly known as: VENTOLIN  HFA Inhale 2 puffs into the lungs every 6 (six) hours as needed  for wheezing or shortness of breath.   atorvastatin  10 MG tablet Commonly known as: LIPITOR Take 1 tablet by mouth once daily   baclofen  10 MG tablet Commonly known as: LIORESAL  TAKE 1 TABLET BY MOUTH THREE TIMES DAILY   blood glucose meter kit and supplies Kit Dispense based on patient and insurance preference. Use up to four times daily as directed. (FOR ICD-137). For QAC - HS accuchecks.  Dispense any approved glucometer with 1 month testing supplies.   buPROPion  150 MG 24 hr tablet Commonly known as: WELLBUTRIN  XL TAKE 1 TABLET BY MOUTH ONCE DAILY IN THE MORNING   carvedilol  6.25 MG tablet Commonly known as:  COREG  TAKE 1 TABLET BY MOUTH TWICE DAILY WITH A MEAL   fluconazole  150 MG tablet Commonly known as: Diflucan  Take 1 tablet (150 mg total) by mouth every 3 (three) days.   insulin  glargine-yfgn 100 UNIT/ML Pen Commonly known as: SEMGLEE  Inject 26 Units into the skin daily.   Insupen Pen Needles 32G X 4 MM Misc Generic drug: Insulin  Pen Needle Use morning and at bedtime.   letrozole  2.5 MG tablet Commonly known as: FEMARA  Take 1 tablet (2.5 mg total) by mouth daily.   tirzepatide 5 MG/0.5ML Pen Commonly known as: MOUNJARO Inject 5 mg into the skin once a week. Started by: Donell PARAS Maricsa Sammons   True Metrix Blood Glucose Test test strip Generic drug: glucose blood Use as instructed   TRUEdraw Lancing Device Misc Use as directed   TRUEplus Lancets 28G Misc 1 each in the morning and at bedtime.   valACYclovir  1000 MG tablet Commonly known as: VALTREX  Take 1,000 mg by mouth 2 (two) times daily.         ALLERGIES: Allergies  Allergen Reactions   Aspirin Rash     REVIEW OF SYSTEMS: A comprehensive ROS was conducted with the patient and is negative except as per HPI    OBJECTIVE:   VITAL SIGNS: BP (!) 158/90   Ht 5' 7.75 (1.721 m)   Wt 233 lb (105.7 kg)   LMP 08/29/2019 (Approximate)   BMI 35.69 kg/m    PHYSICAL EXAM:  General: Pt  appears well and is in NAD  Lungs: Clear with good BS bilat with no r  Heart: RRR   Abdomen: soft, nontender  Extremities:  Lower extremities - No pretibial edema  Neuro: MS is good with appropriate affect, pt is alert and Ox3    DM foot exam: 02/02/2024  The skin of the feet is intact without sores or ulcerations. The pedal pulses are 2+ on right and 2+ on left. The sensation is intact to a screening 5.07, 10 gram monofilament bilaterally   DATA REVIEWED:  Lab Results  Component Value Date   HGBA1C 6.1 (A) 02/02/2024   HGBA1C 10.0 (H) 08/24/2023   HGBA1C 5.8 (A) 02/02/2023    Latest Reference Range & Units 08/24/23 17:08  Sodium 134 - 144 mmol/L 139  Potassium 3.5 - 5.2 mmol/L 4.5  Chloride 96 - 106 mmol/L 100  CO2 20 - 29 mmol/L 20  Glucose 70 - 99 mg/dL 669 (H)  BUN 6 - 24 mg/dL 10  Creatinine 9.42 - 8.99 mg/dL 8.94 (H)  Calcium  8.7 - 10.2 mg/dL 9.4  BUN/Creatinine Ratio 9 - 23  10  eGFR >59 mL/min/1.73 68  Total CHOL/HDL Ratio 0.0 - 4.4 ratio 5.0 (H)  Cholesterol, Total 100 - 199 mg/dL 839  HDL Cholesterol >60 mg/dL 32 (L)  MICROALB/CREAT RATIO 0 - 29 mg/g creat <6  Triglycerides 0 - 149 mg/dL 741 (H)  VLDL Cholesterol Cal 5 - 40 mg/dL 43 (H)  LDL Chol Calc (NIH) 0 - 99 mg/dL 85    Latest Reference Range & Units 08/24/23 17:08  Microalbumin, Urine Not Estab. ug/mL <3.0  MICROALB/CREAT RATIO 0 - 29 mg/g creat <6  Creatinine, Urine Not Estab. mg/dL 50.7     Old records , labs and images have been reviewed.    ASSESSMENT / PLAN / RECOMMENDATIONS:   1) Type 2 Diabetes Mellitus, Optimally controlled, Without complications - Most recent A1c of 6.1%. Goal A1c < 7.0 %.    -A1c has trended down from 10.0%  to 6.1% - She has been out of Farxiga  and Ozempic  for couple weeks due to cost, she has not been using coupons.  She did develop constipation on Ozempic  - I did recommend switching to Mounjaro, coupon was provided - No changes to her insulin  at this  time  MEDICATIONS: Stop Farxiga  10 mg daily Stop Ozempic  Start Mounjaro 5 mg weekly Continue Lantus  26 units daily  EDUCATION / INSTRUCTIONS: BG monitoring instructions: Patient is instructed to check her blood sugars 1 times a day, fasting. Call [place] [facility] if: BG persistently < 70  I reviewed the Rule of 15 for the treatment of hypoglycemia in detail with the patient. Literature supplied.   2) Diabetic complications:  Eye: Does not have known diabetic retinopathy.  Neuro/ Feet: Does not have known diabetic peripheral neuropathy. Renal: Patient does not have known baseline CKD. She is not on an ACEI/ARB at present.   3) Dyslipidemia :  -Triglycerides were elevated, LDL acceptable - She is on atorvastatin  10 mg daily - Will continue to monitor  Follow-up in 6 months  Signed electronically by: Stefano Redgie Butts, MD  [facility] Endocrinology  [facility] [address] [place] [place], [place] [address] Phone: [phone] FAX: [phone]   CC: Jarold Medici, MD [address] [place] [address] Phone: [phone]  Fax: [phone]    Return to Endocrinology clinic as below: Future Appointments  Date Time Provider Department Center  02/14/2024  1:45 PM Odean Potts, MD [facility] None  02/14/2024  2:15 PM [facility] None  04/19/2024 10:20 AM Jarold Medici, MD [facility] [phone] [place]

## 2024-02-02 NOTE — Patient Instructions (Addendum)
 Stop Ozempic   Start Mounjaro 5 mg weekly  Continue Semglee   26 units once daily      HOW TO TREAT LOW BLOOD SUGARS (Blood sugar LESS THAN 70 MG/DL) Please follow the RULE OF 15 for the treatment of hypoglycemia treatment (when your (blood sugars are less than 70 mg/dL)   STEP 1: Take 15 grams of carbohydrates when your blood sugar is low, which includes:  3-4 GLUCOSE TABS  OR 3-4 OZ OF JUICE OR REGULAR SODA OR ONE TUBE OF GLUCOSE GEL    STEP 2: RECHECK blood sugar in 15 MINUTES STEP 3: If your blood sugar is still low at the 15 minute recheck --> then, go back to STEP 1 and treat AGAIN with another 15 grams of carbohydrates.

## 2024-02-14 ENCOUNTER — Inpatient Hospital Stay: Payer: Self-pay | Attending: Hematology and Oncology | Admitting: Hematology and Oncology

## 2024-02-14 ENCOUNTER — Inpatient Hospital Stay: Payer: Self-pay

## 2024-02-14 VITALS — BP 140/88 | HR 83 | Temp 97.4°F | Resp 18 | Ht 67.75 in | Wt 232.9 lb

## 2024-02-14 DIAGNOSIS — C50411 Malignant neoplasm of upper-outer quadrant of right female breast: Secondary | ICD-10-CM

## 2024-02-14 DIAGNOSIS — Z170 Estrogen receptor positive status [ER+]: Secondary | ICD-10-CM | POA: Diagnosis not present

## 2024-02-14 DIAGNOSIS — Z95828 Presence of other vascular implants and grafts: Secondary | ICD-10-CM

## 2024-02-14 MED ORDER — GOSERELIN ACETATE 3.6 MG ~~LOC~~ IMPL
3.6000 mg | DRUG_IMPLANT | Freq: Once | SUBCUTANEOUS | Status: AC
  Administered 2024-02-14: 3.6 mg via SUBCUTANEOUS
  Filled 2024-02-14: qty 3.6

## 2024-02-14 NOTE — Progress Notes (Signed)
 Patient Care Team: Jarold Medici, MD as PCP - General (Internal Medicine) Tyree Nanetta SAILOR, RN as Oncology Nurse Navigator Ebbie Cough, MD as Consulting Physician (General Surgery) Odean Potts, MD as Consulting Physician (Hematology and Oncology) Dewey Rush, MD as Consulting Physician (Radiation Oncology)  DIAGNOSIS:  Encounter Diagnosis  Name Primary?   Malignant neoplasm of upper-outer quadrant of right breast in female, estrogen receptor positive (HCC) Yes    SUMMARY OF ONCOLOGIC HISTORY: Oncology History  Malignant neoplasm of upper-outer quadrant of right breast in female, estrogen receptor positive (HCC)  06/21/2019 Initial Diagnosis   Patient palpated a painful right breast lump x3 weeks. Mammogram showed a 2.0cm right breast mass at the 10 o'clock position, a 3.6cm enlarged right axillary lymph node, and a 0.5cm left breast mass. Biopsy showed, in the right breast and axilla, IDC, grade 3, HER-2 equivocal by IHC, + by FISH, ER+ 40%, PR - 0%, Ki67 80%, and in the left breast, fibrocystic changes, no malignancy.    07/04/2019 Cancer Staging   Staging form: Breast, AJCC 8th Edition - Clinical stage from 07/04/2019: Stage IIB (cT2, cN1(f), cM0, G3, ER+, PR-, HER2+) - Signed by Odean Potts, MD on 07/05/2019   07/14/2019 - 10/07/2019 Chemotherapy   The patient had dexamethasone  (DECADRON ) 4 MG tablet, 4 mg (100 % of original dose 4 mg), Oral, Daily, 1 of 1 cycle, Start date: 07/05/2019, End date: 08/04/2019 Dose modification: 4 mg (original dose 4 mg, Cycle 0) palonosetron  (ALOXI ) injection 0.25 mg, 0.25 mg, Intravenous,  Once, 5 of 5 cycles Administration: 0.25 mg (07/14/2019), 0.25 mg (08/04/2019), 0.25 mg (10/05/2019), 0.25 mg (08/25/2019), 0.25 mg (09/14/2019) pegfilgrastim -jmdb (FULPHILA ) injection 6 mg, 6 mg, Subcutaneous,  Once, 5 of 5 cycles Administration: 6 mg (07/17/2019), 6 mg (08/07/2019), 6 mg (10/07/2019), 6 mg (08/29/2019), 6 mg (09/16/2019) CARBOplatin  (PARAPLATIN ) 700 mg in  sodium chloride  0.9 % 250 mL chemo infusion, 700 mg (100 % of original dose 700 mg), Intravenous,  Once, 5 of 5 cycles Dose modification: 700 mg (original dose 700 mg, Cycle 1, Reason: Provider Judgment), 600 mg (original dose 700 mg, Cycle 2, Reason: Dose not tolerated) Administration: 700 mg (07/14/2019), 600 mg (08/04/2019), 600 mg (10/05/2019), 600 mg (08/25/2019), 600 mg (09/14/2019) DOCEtaxel  (TAXOTERE ) 170 mg in sodium chloride  0.9 % 250 mL chemo infusion, 75 mg/m2 = 170 mg, Intravenous,  Once, 5 of 5 cycles Dose modification: 65 mg/m2 (original dose 75 mg/m2, Cycle 2, Reason: Dose not tolerated) Administration: 170 mg (07/14/2019), 150 mg (08/04/2019), 150 mg (10/05/2019), 150 mg (08/25/2019), 150 mg (09/14/2019) fosaprepitant  (EMEND) 150 mg in sodium chloride  0.9 % 145 mL IVPB, 150 mg, Intravenous,  Once, 5 of 5 cycles Administration: 150 mg (07/14/2019), 150 mg (08/04/2019), 150 mg (10/05/2019), 150 mg (08/25/2019), 150 mg (09/14/2019) pertuzumab  (PERJETA ) 840 mg in sodium chloride  0.9 % 250 mL chemo infusion, 840 mg, Intravenous, Once, 5 of 5 cycles Administration: 840 mg (07/14/2019), 420 mg (08/04/2019), 420 mg (10/05/2019), 420 mg (08/25/2019), 420 mg (09/14/2019) trastuzumab -dkst (OGIVRI ) 900 mg in sodium chloride  0.9 % 250 mL chemo infusion, 903 mg, Intravenous,  Once, 5 of 5 cycles Administration: 900 mg (07/14/2019), 672 mg (08/04/2019), 672 mg (10/05/2019), 672 mg (08/25/2019), 672 mg (09/14/2019)  for chemotherapy treatment.    07/22/2019 Genetic Testing   Negative genetic testing:  No pathogenic variants detected on the [facility] Common Hereditary Cancers Panel. The report date is 07/22/2019.  The Common Hereditary Cancers Panel offered by [facility] includes sequencing and/or deletion duplication testing of the following  48 genes: APC, ATM, AXIN2, BARD1, BMPR1A, BRCA1, BRCA2, BRIP1, CDH1, CDK4, CDKN2A (p14ARF), CDKN2A (p16INK4a), CHEK2, CTNNA1, DICER1, EPCAM (Deletion/duplication testing only), GREM1 (promoter region  deletion/duplication testing only), KIT, MEN1, MLH1, MSH2, MSH3, MSH6, MUTYH, NBN, NF1, NHTL1, PALB2, PDGFRA, PMS2, POLD1, POLE, PTEN, RAD50, RAD51C, RAD51D, RNF43, SDHB, SDHC, SDHD, SMAD4, SMARCA4. STK11, TP53, TSC1, TSC2, and VHL.  The following genes were evaluated for sequence changes only: SDHA and HOXB13 c.251G>A variant only.    11/20/2019 - 07/18/2020 Chemotherapy   [facility]       12/13/2019 Surgery   Right lumpectomy [facility]): IDC, 5 right axillary lymph nodes negative for carcinoma.    01/22/2020 - 03/12/2020 Radiation Therapy   Site/dose:   The patient initially received a dose of 50.4 Gy in 28 fractions to the breast using whole-breast tangent fields. This was delivered using a 3-D conformal technique. The patient then received a boost to the seroma. This delivered an additional 10 Gy in 5 fractions using a 3-field photon boost technique. The total dose was 60.4 Gy.     03/30/2020 -  Anti-estrogen oral therapy   Anastrozole  switched to Letrozole  Nov 2022     CHIEF COMPLIANT: Follow-up on antiestrogen therapy  HISTORY OF PRESENT ILLNESS  History of Present Illness Heidi Costa is a 43 year old female who presents for follow-up of her hormone therapy regimen.  She is in her fifth year of hormone therapy injections, experiencing occasional cramps but otherwise tolerating the treatment well. She is concerned about the potential return of menstrual cycles after stopping the injections, having started the treatment at age 18. She is currently on letrozole . She has a persistent seroma in her breast, causing occasional discomfort, with previous drainage attempts being unsuccessful. She continues with annual mammograms.     ALLERGIES:  is allergic to aspirin.  MEDICATIONS:  Current Outpatient Medications  Medication Sig Dispense Refill   acetaminophen  (TYLENOL ) 500 MG tablet Take 500-1,000 mg by mouth daily as needed for pain.     albuterol  (VENTOLIN  HFA) 108 (90 Base) MCG/ACT  inhaler Inhale 2 puffs into the lungs every 6 (six) hours as needed for wheezing or shortness of breath. 18 g 2   atorvastatin  (LIPITOR) 10 MG tablet Take 1 tablet by mouth once daily 90 tablet 0   baclofen  (LIORESAL ) 10 MG tablet TAKE 1 TABLET BY MOUTH THREE TIMES DAILY 90 tablet 0   blood glucose meter kit and supplies KIT Dispense based on patient and insurance preference. Use up to four times daily as directed. (FOR ICD-137). For QAC - HS accuchecks.  Dispense any approved glucometer with 1 month testing supplies. 1 each 1   Blood Glucose Monitoring Suppl (ACCU-CHEK GUIDE) w/Device KIT Use to check blood sugar 2 times daily 1 kit 0   buPROPion  (WELLBUTRIN  XL) 150 MG 24 hr tablet TAKE 1 TABLET BY MOUTH ONCE DAILY IN THE MORNING 90 tablet 0   carvedilol  (COREG ) 6.25 MG tablet TAKE 1 TABLET BY MOUTH TWICE DAILY WITH A MEAL 180 tablet 0   glucose blood test strip Use as instructed 100 each 12   insulin  glargine-yfgn (SEMGLEE ) 100 UNIT/ML Pen Inject 26 Units into the skin daily. 30 mL 3   Insulin  Pen Needle 32G X 4 MM MISC Use morning and at bedtime. 100 each 3   Lancet Devices (TRUEDRAW LANCING DEVICE) MISC Use as directed 1 each 0   letrozole  (FEMARA ) 2.5 MG tablet Take 1 tablet (2.5 mg total) by mouth daily. 90 tablet 3  tirzepatide (MOUNJARO) 5 MG/0.5ML Pen Inject 5 mg into the skin once a week. 6 mL 3   TRUEplus Lancets 28G MISC 1 each in the morning and at bedtime. 100 each 0   valACYclovir  (VALTREX ) 1000 MG tablet Take 1,000 mg by mouth 2 (two) times daily.     Current Facility-Administered Medications  Medication Dose Route Frequency Provider Last Rate Last Admin   lidocaine  (PF) (XYLOCAINE ) 1 % injection 10 mL  10 mL Intradermal Once         PHYSICAL EXAMINATION: ECOG PERFORMANCE STATUS: 1 - Symptomatic but completely ambulatory  Vitals:   02/14/24 1402  BP: (!) 140/88  Pulse: 83  Resp: 18  Temp: (!) 97.4 F (36.3 C)  SpO2: 100%   Filed Weights   02/14/24 1402  Weight: 232  lb 14.4 oz (105.6 kg)    Physical Exam Breast exam: Benign  (exam performed in the presence of a chaperone)  LABORATORY DATA:  I have reviewed the data as listed    Latest Ref Rng & Units 08/24/2023    5:08 PM 08/10/2023    3:32 PM 08/10/2023    3:00 PM  CMP  Glucose 70 - 99 mg/dL 669   449   BUN 6 - 24 mg/dL 10   11   Creatinine 9.42 - 1.00 mg/dL 8.94   9.08   Sodium 865 - 144 mmol/L 139  134  133   Potassium 3.5 - 5.2 mmol/L 4.5  4.4  4.7   Chloride 96 - 106 mmol/L 100   98   CO2 20 - 29 mmol/L 20   24   Calcium  8.7 - 10.2 mg/dL 9.4   9.3   Total Protein 6.5 - 8.1 g/dL   6.9   Total Bilirubin 0.0 - 1.2 mg/dL   0.7   Alkaline Phos 38 - 126 U/L   98   AST 15 - 41 U/L   20   ALT 0 - 44 U/L   18     Lab Results  Component Value Date   WBC 6.7 08/10/2023   HGB 13.3 08/10/2023   HCT 39.0 08/10/2023   MCV 93.7 08/10/2023   PLT 328 08/10/2023   NEUTROABS 2.7 03/24/2021    ASSESSMENT & PLAN:  Malignant neoplasm of upper-outer quadrant of right breast in female, 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   Treatment plan: 1. Neoadjuvant chemotherapy with [facility] Perjeta  5 cycles started 07/12/2019 completed 10/05/2019 followed by Herceptin  Perjeta  completed 07/18/2020 2. breast conserving surgery with sentinel lymph node study: 12/13/2019: Pathologic complete response, 0/5 lymph nodes negative 3. Adjuvant radiation therapy 01/23/2020- 03/12/2020 4.  Followed by antiestrogen therapy with anastrozole  1 mg daily started 03/30/2020 ----------------------------------------------------------------------------------------------------------------------------------------------------------- Current treatment:  Letrozole  with Zoladex    ER and HER2 positive breast  cancer: She will continue on letrozole  and Zoladex .  She is due for an injection today. Mammogram 08/28/2023: [facility]: Benign breast density category A Previously right breast nodule: 08/03/2022: Biopsy revealed fat necrosis   Continue with monthly injection appointments She will return every 4 weeks for her Zoladex  injections  After the next year we will switch her to tamoxifen. I will see the patient back in 1 year   No orders of the defined types were placed in this encounter.  The patient has a good understanding of the overall  plan. she agrees with it. she will call with any problems that may develop before the next visit here.  I personally spent a total of 30 minutes in the care of the patient today including preparing to see the patient, getting/reviewing separately obtained history, performing a medically appropriate exam/evaluation, counseling and educating, placing orders, referring and communicating with other health care professionals, documenting clinical information in the EHR, independently interpreting results, communicating results, and coordinating care.   Viinay K Aminata [place], MD 02/14/24

## 2024-02-14 NOTE — Assessment & Plan Note (Signed)
 stage IIb right breast ER positive, HER2 positive breast cancer diagnosed in March 2021 status post neoadjuvant chemotherapy, lumpectomy, adjuvant radiation, maintenance Herceptin  Perjeta , and antiestrogen therapy with Zoladex  and letrozole .   Treatment plan: 1. Neoadjuvant chemotherapy with [facility] Perjeta  5 cycles started 07/12/2019 completed 10/05/2019 followed by Herceptin  Perjeta  completed 07/18/2020 2. breast conserving surgery with sentinel lymph node study: 12/13/2019: Pathologic complete response, 0/5 lymph nodes negative 3. Adjuvant radiation therapy 01/23/2020- 03/12/2020 4.  Followed by antiestrogen therapy with anastrozole  1 mg daily started 03/30/2020 ----------------------------------------------------------------------------------------------------------------------------------------------------------- Current treatment:  Letrozole  with Zoladex    ER and HER2 positive breast cancer: She will continue on letrozole  and Zoladex .  She is due for an injection today. Mammogram 08/28/2023: [facility]: Benign breast density category A Previously right breast nodule: 08/03/2022: Biopsy revealed fat necrosis   She receives her injections on Saturdays because she works for the [employer]   She will return every 4 weeks for her Zoladex  injections I will see the patient back in 1 year

## 2024-03-18 ENCOUNTER — Inpatient Hospital Stay: Attending: Hematology and Oncology

## 2024-03-18 VITALS — BP 131/89 | HR 92 | Temp 97.2°F | Resp 20

## 2024-03-18 DIAGNOSIS — Z1731 Human epidermal growth factor receptor 2 positive status: Secondary | ICD-10-CM | POA: Diagnosis not present

## 2024-03-18 DIAGNOSIS — C50411 Malignant neoplasm of upper-outer quadrant of right female breast: Secondary | ICD-10-CM | POA: Diagnosis present

## 2024-03-18 DIAGNOSIS — Z170 Estrogen receptor positive status [ER+]: Secondary | ICD-10-CM | POA: Diagnosis not present

## 2024-03-18 DIAGNOSIS — Z1722 Progesterone receptor negative status: Secondary | ICD-10-CM | POA: Insufficient documentation

## 2024-03-18 DIAGNOSIS — Z5111 Encounter for antineoplastic chemotherapy: Secondary | ICD-10-CM | POA: Insufficient documentation

## 2024-03-18 DIAGNOSIS — Z95828 Presence of other vascular implants and grafts: Secondary | ICD-10-CM

## 2024-03-18 MED ORDER — GOSERELIN ACETATE 3.6 MG ~~LOC~~ IMPL
3.6000 mg | DRUG_IMPLANT | Freq: Once | SUBCUTANEOUS | Status: AC
  Administered 2024-03-18: 3.6 mg via SUBCUTANEOUS
  Filled 2024-03-18: qty 3.6

## 2024-04-14 ENCOUNTER — Other Ambulatory Visit: Payer: Self-pay | Admitting: Hematology and Oncology

## 2024-04-14 ENCOUNTER — Other Ambulatory Visit: Payer: Self-pay | Admitting: Internal Medicine

## 2024-04-14 DIAGNOSIS — E782 Mixed hyperlipidemia: Secondary | ICD-10-CM

## 2024-04-17 ENCOUNTER — Encounter: Payer: Self-pay | Admitting: Hematology and Oncology

## 2024-04-19 ENCOUNTER — Encounter: Payer: Self-pay | Admitting: Internal Medicine

## 2024-04-22 ENCOUNTER — Inpatient Hospital Stay

## 2024-04-25 ENCOUNTER — Telehealth: Payer: Self-pay | Admitting: Hematology and Oncology

## 2024-04-25 NOTE — Telephone Encounter (Signed)
 Left vm for pt to call back and rescheduled missed appt 1/24

## 2024-04-28 ENCOUNTER — Inpatient Hospital Stay: Attending: Hematology and Oncology

## 2024-04-28 VITALS — BP 125/78 | HR 90 | Temp 98.2°F | Resp 20

## 2024-04-28 DIAGNOSIS — Z170 Estrogen receptor positive status [ER+]: Secondary | ICD-10-CM

## 2024-04-28 DIAGNOSIS — Z95828 Presence of other vascular implants and grafts: Secondary | ICD-10-CM

## 2024-04-28 MED ORDER — GOSERELIN ACETATE 3.6 MG ~~LOC~~ IMPL
3.6000 mg | DRUG_IMPLANT | Freq: Once | SUBCUTANEOUS | Status: AC
  Administered 2024-04-28: 3.6 mg via SUBCUTANEOUS
  Filled 2024-04-28: qty 3.6

## 2024-05-27 ENCOUNTER — Inpatient Hospital Stay: Attending: Hematology and Oncology

## 2024-06-24 ENCOUNTER — Inpatient Hospital Stay: Attending: Hematology and Oncology

## 2024-07-22 ENCOUNTER — Inpatient Hospital Stay: Attending: Hematology and Oncology

## 2024-08-01 ENCOUNTER — Ambulatory Visit: Admitting: Internal Medicine

## 2024-08-07 ENCOUNTER — Encounter: Admitting: Internal Medicine

## 2024-08-19 ENCOUNTER — Inpatient Hospital Stay: Attending: Hematology and Oncology

## 2024-09-16 ENCOUNTER — Inpatient Hospital Stay: Attending: Hematology and Oncology

## 2024-10-14 ENCOUNTER — Inpatient Hospital Stay: Attending: Hematology and Oncology

## 2024-11-11 ENCOUNTER — Inpatient Hospital Stay: Attending: Hematology and Oncology

## 2024-12-16 ENCOUNTER — Inpatient Hospital Stay: Attending: Hematology and Oncology

## 2025-01-13 ENCOUNTER — Inpatient Hospital Stay: Attending: Hematology and Oncology

## 2025-02-13 ENCOUNTER — Inpatient Hospital Stay: Attending: Hematology and Oncology | Admitting: Hematology and Oncology

## 2025-02-13 ENCOUNTER — Inpatient Hospital Stay
